# Patient Record
Sex: Female | Born: 1955 | Race: White | Hispanic: No | Smoking: Current every day smoker
Health system: Southern US, Community
[De-identification: ages and names within clinical notes are randomized; demographics above are authoritative.]

## PROBLEM LIST (undated history)

## (undated) DIAGNOSIS — C801 Malignant (primary) neoplasm, unspecified: Secondary | ICD-10-CM

## (undated) DIAGNOSIS — M1990 Unspecified osteoarthritis, unspecified site: Secondary | ICD-10-CM

## (undated) DIAGNOSIS — F419 Anxiety disorder, unspecified: Secondary | ICD-10-CM

## (undated) DIAGNOSIS — I2510 Atherosclerotic heart disease of native coronary artery without angina pectoris: Secondary | ICD-10-CM

## (undated) DIAGNOSIS — J449 Chronic obstructive pulmonary disease, unspecified: Secondary | ICD-10-CM

## (undated) DIAGNOSIS — R0602 Shortness of breath: Secondary | ICD-10-CM

## (undated) DIAGNOSIS — I639 Cerebral infarction, unspecified: Secondary | ICD-10-CM

## (undated) DIAGNOSIS — E785 Hyperlipidemia, unspecified: Secondary | ICD-10-CM

## (undated) DIAGNOSIS — J189 Pneumonia, unspecified organism: Secondary | ICD-10-CM

## (undated) DIAGNOSIS — Z9981 Dependence on supplemental oxygen: Secondary | ICD-10-CM

## (undated) DIAGNOSIS — I10 Essential (primary) hypertension: Secondary | ICD-10-CM

## (undated) DIAGNOSIS — F32A Depression, unspecified: Secondary | ICD-10-CM

## (undated) DIAGNOSIS — K219 Gastro-esophageal reflux disease without esophagitis: Secondary | ICD-10-CM

## (undated) DIAGNOSIS — R51 Headache: Secondary | ICD-10-CM

## (undated) DIAGNOSIS — F329 Major depressive disorder, single episode, unspecified: Secondary | ICD-10-CM

## (undated) DIAGNOSIS — R32 Unspecified urinary incontinence: Secondary | ICD-10-CM

## (undated) HISTORY — PX: TUBAL LIGATION: SHX77

## (undated) HISTORY — PX: ROTATOR CUFF REPAIR: SHX139

## (undated) HISTORY — PX: CHOLECYSTECTOMY: SHX55

## (undated) HISTORY — PX: CAROTID STENT: SHX1301

## (undated) HISTORY — DX: Essential (primary) hypertension: I10

---

## 2005-06-09 ENCOUNTER — Inpatient Hospital Stay (HOSPITAL_COMMUNITY): Admission: EM | Admit: 2005-06-09 | Discharge: 2005-06-10 | Payer: Self-pay | Admitting: Emergency Medicine

## 2005-06-10 ENCOUNTER — Encounter (INDEPENDENT_AMBULATORY_CARE_PROVIDER_SITE_OTHER): Payer: Self-pay | Admitting: General Surgery

## 2009-09-16 ENCOUNTER — Emergency Department (HOSPITAL_COMMUNITY): Admission: EM | Admit: 2009-09-16 | Discharge: 2009-09-16 | Payer: Self-pay | Admitting: Emergency Medicine

## 2009-12-05 ENCOUNTER — Inpatient Hospital Stay (HOSPITAL_COMMUNITY): Admission: EM | Admit: 2009-12-05 | Discharge: 2009-12-13 | Payer: Self-pay | Admitting: Emergency Medicine

## 2009-12-06 ENCOUNTER — Encounter (INDEPENDENT_AMBULATORY_CARE_PROVIDER_SITE_OTHER): Payer: Self-pay | Admitting: Neurology

## 2009-12-10 ENCOUNTER — Ambulatory Visit: Payer: Self-pay | Admitting: Physical Medicine & Rehabilitation

## 2009-12-26 ENCOUNTER — Inpatient Hospital Stay (HOSPITAL_COMMUNITY): Admission: RE | Admit: 2009-12-26 | Discharge: 2009-12-27 | Payer: Self-pay | Admitting: Interventional Radiology

## 2010-01-09 ENCOUNTER — Encounter: Payer: Self-pay | Admitting: Interventional Radiology

## 2010-01-10 ENCOUNTER — Ambulatory Visit (HOSPITAL_COMMUNITY): Admission: RE | Admit: 2010-01-10 | Discharge: 2010-01-10 | Payer: Self-pay | Admitting: Interventional Radiology

## 2010-01-10 ENCOUNTER — Ambulatory Visit: Payer: Self-pay | Admitting: Vascular Surgery

## 2010-01-10 ENCOUNTER — Encounter (INDEPENDENT_AMBULATORY_CARE_PROVIDER_SITE_OTHER): Payer: Self-pay | Admitting: Interventional Radiology

## 2010-02-12 ENCOUNTER — Ambulatory Visit (HOSPITAL_COMMUNITY): Admission: RE | Admit: 2010-02-12 | Discharge: 2010-02-13 | Payer: Self-pay | Admitting: Interventional Radiology

## 2010-02-26 ENCOUNTER — Encounter: Payer: Self-pay | Admitting: Interventional Radiology

## 2010-11-17 ENCOUNTER — Encounter: Payer: Self-pay | Admitting: Neurology

## 2011-01-14 LAB — BASIC METABOLIC PANEL
BUN: 11 mg/dL (ref 6–23)
CO2: 27 mEq/L (ref 19–32)
Calcium: 9.8 mg/dL (ref 8.4–10.5)
Chloride: 105 mEq/L (ref 96–112)
Creatinine, Ser: 0.99 mg/dL (ref 0.4–1.2)
GFR calc Af Amer: >60 mL/min (ref >60)
Potassium: 4.4 mEq/L (ref 3.5–5.1)

## 2011-01-14 LAB — DIFFERENTIAL
Basophils Relative: 0 % (ref 0–1)
Eosinophils Absolute: 0.2 10*3/uL (ref 0.0–0.7)
Eosinophils Relative: 2 % (ref 0–5)
Lymphs Abs: 3.4 10*3/uL (ref 0.7–4.0)
Monocytes Absolute: 0.5 10*3/uL (ref 0.1–1.0)
Neutro Abs: 4.8 10*3/uL (ref 1.7–7.7)

## 2011-01-14 LAB — PROTIME-INR
INR: 1.08 (ref 0.00–1.49)
INR: 1.11 (ref 0.00–1.49)
Prothrombin Time: 13.9 seconds (ref 11.6–15.2)
Prothrombin Time: 14.2 seconds (ref 11.6–15.2)

## 2011-01-14 LAB — HEPARIN LEVEL (UNFRACTIONATED)
Heparin Unfractionated: 0.14 IU/mL — ABNORMAL LOW (ref 0.30–0.70)
Heparin Unfractionated: 0.15 IU/mL — ABNORMAL LOW (ref 0.30–0.70)

## 2011-01-14 LAB — CBC
Hemoglobin: 12.4 g/dL (ref 12.0–15.0)
MCHC: 34.5 g/dL (ref 30.0–36.0)
MCV: 88.3 fL (ref 78.0–100.0)
RBC: 4.01 MIL/uL (ref 3.87–5.11)

## 2011-01-14 LAB — APTT: aPTT: 50 seconds — ABNORMAL HIGH (ref 24–37)

## 2011-01-16 LAB — URINALYSIS, ROUTINE W REFLEX MICROSCOPIC
Nitrite: NEGATIVE
Protein, ur: NEGATIVE mg/dL
Specific Gravity, Urine: 1.016 (ref 1.005–1.030)
Urobilinogen, UA: 0.2 mg/dL (ref 0.0–1.0)

## 2011-01-16 LAB — COMPREHENSIVE METABOLIC PANEL
AST: 12 U/L (ref 0–37)
CO2: 30 mEq/L (ref 19–32)
Calcium: 9.3 mg/dL (ref 8.4–10.5)
Creatinine, Ser: 0.78 mg/dL (ref 0.4–1.2)
GFR calc Af Amer: >60 mL/min (ref >60)
GFR calc non Af Amer: >60 mL/min (ref >60)

## 2011-01-16 LAB — RAPID URINE DRUG SCREEN, HOSP PERFORMED
Amphetamines: NOT DETECTED
Cocaine: NOT DETECTED
Opiates: NOT DETECTED
Tetrahydrocannabinol: NOT DETECTED

## 2011-01-16 LAB — BASIC METABOLIC PANEL
BUN: 6 mg/dL (ref 6–23)
Chloride: 103 mEq/L (ref 96–112)
Glucose, Bld: 100 mg/dL — ABNORMAL HIGH (ref 70–99)
Potassium: 4.4 mEq/L (ref 3.5–5.1)
Sodium: 139 mEq/L (ref 135–145)

## 2011-01-16 LAB — DIFFERENTIAL
Basophils Absolute: 0 10*3/uL (ref 0.0–0.1)
Eosinophils Absolute: 0.2 10*3/uL (ref 0.0–0.7)
Eosinophils Relative: 2 % (ref 0–5)
Eosinophils Relative: 2 % (ref 0–5)
Lymphocytes Relative: 36 % (ref 12–46)
Lymphocytes Relative: 41 % (ref 12–46)
Lymphs Abs: 3.5 10*3/uL (ref 0.7–4.0)
Lymphs Abs: 3.7 10*3/uL (ref 0.7–4.0)
Monocytes Absolute: 0.6 10*3/uL (ref 0.1–1.0)
Neutro Abs: 4.3 10*3/uL (ref 1.7–7.7)

## 2011-01-16 LAB — LIPID PANEL
Cholesterol: 201 mg/dL — ABNORMAL HIGH (ref 0–200)
LDL Cholesterol: 122 mg/dL — ABNORMAL HIGH (ref 0–99)

## 2011-01-16 LAB — PROTIME-INR
INR: 1.07 (ref 0.00–1.49)
Prothrombin Time: 13.2 seconds (ref 11.6–15.2)
Prothrombin Time: 13.8 seconds (ref 11.6–15.2)

## 2011-01-16 LAB — CBC
HCT: 45.3 % (ref 36.0–46.0)
Hemoglobin: 15.3 g/dL — ABNORMAL HIGH (ref 12.0–15.0)
MCHC: 34.3 g/dL (ref 30.0–36.0)
MCV: 90.4 fL (ref 78.0–100.0)
MCV: 90.9 fL (ref 78.0–100.0)
Platelets: 202 10*3/uL (ref 150–400)
Platelets: 205 10*3/uL (ref 150–400)
RDW: 13.7 % (ref 11.5–15.5)

## 2011-01-16 LAB — HEMOGLOBIN A1C: Mean Plasma Glucose: 114 mg/dL

## 2011-01-16 LAB — POCT CARDIAC MARKERS: Myoglobin, poc: 74.8 ng/mL (ref 12–200)

## 2011-01-16 LAB — APTT: aPTT: 30 seconds (ref 24–37)

## 2011-01-16 LAB — CK TOTAL AND CKMB (NOT AT ARMC): Relative Index: INVALID (ref 0.0–2.5)

## 2011-01-16 LAB — TROPONIN I: Troponin I: <0.01 ng/mL (ref 0.00–0.06)

## 2011-01-20 LAB — CBC
HCT: 40.2 % (ref 36.0–46.0)
Hemoglobin: 13.4 g/dL (ref 12.0–15.0)
MCV: 90.1 fL (ref 78.0–100.0)
Platelets: 173 10*3/uL (ref 150–400)
RBC: 4.46 MIL/uL (ref 3.87–5.11)
WBC: 9.1 10*3/uL (ref 4.0–10.5)

## 2011-01-20 LAB — HEPARIN LEVEL (UNFRACTIONATED): Heparin Unfractionated: 0.23 IU/mL — ABNORMAL LOW (ref 0.30–0.70)

## 2011-01-20 LAB — BASIC METABOLIC PANEL
BUN: 5 mg/dL — ABNORMAL LOW (ref 6–23)
Chloride: 114 mEq/L — ABNORMAL HIGH (ref 96–112)
GFR calc non Af Amer: >60 mL/min (ref >60)
Potassium: 3.5 mEq/L (ref 3.5–5.1)
Sodium: 144 mEq/L (ref 135–145)

## 2011-01-20 LAB — MRSA PCR SCREENING: MRSA by PCR: NEGATIVE

## 2011-01-29 LAB — DIFFERENTIAL
Lymphocytes Relative: 33 % (ref 12–46)
Lymphs Abs: 3.1 10*3/uL (ref 0.7–4.0)
Monocytes Relative: 4 % (ref 3–12)
Neutro Abs: 5.7 10*3/uL (ref 1.7–7.7)
Neutrophils Relative %: 61 % (ref 43–77)

## 2011-01-29 LAB — CBC
HCT: 46.5 % — ABNORMAL HIGH (ref 36.0–46.0)
Hemoglobin: 15.6 g/dL — ABNORMAL HIGH (ref 12.0–15.0)
MCHC: 33.5 g/dL (ref 30.0–36.0)
RDW: 14.5 % (ref 11.5–15.5)

## 2011-01-29 LAB — COMPREHENSIVE METABOLIC PANEL
BUN: 8 mg/dL (ref 6–23)
Calcium: 9.3 mg/dL (ref 8.4–10.5)
Creatinine, Ser: 0.75 mg/dL (ref 0.4–1.2)
Glucose, Bld: 110 mg/dL — ABNORMAL HIGH (ref 70–99)
Sodium: 141 mEq/L (ref 135–145)
Total Protein: 6.8 g/dL (ref 6.0–8.3)

## 2011-01-29 LAB — URINALYSIS, ROUTINE W REFLEX MICROSCOPIC
Glucose, UA: NEGATIVE mg/dL
Hgb urine dipstick: NEGATIVE
Specific Gravity, Urine: 1.02 (ref 1.005–1.030)

## 2011-01-29 LAB — RAPID URINE DRUG SCREEN, HOSP PERFORMED
Amphetamines: NOT DETECTED
Barbiturates: NOT DETECTED
Benzodiazepines: POSITIVE — AB
Opiates: NOT DETECTED

## 2011-03-14 NOTE — Op Note (Signed)
[name], April [name]               ACCOUNT NO.:  [id number]   MEDICAL RECORD NO.:  [id number]          PATIENT TYPE:  INP   LOCATION:  A308                          FACILITY:  [facility]   PHYSICIAN:  Dalia Heading, M.D.  DATE OF BIRTH:  Sep 17, 1956   DATE OF PROCEDURE:  06/10/2005  DATE OF DISCHARGE:                                 OPERATIVE REPORT   PREOPERATIVE DIAGNOSIS:  Cholecystitis, cholelithiasis.   POSTOPERATIVE DIAGNOSIS:  Cholecystitis, cholelithiasis.   PROCEDURE:  Laparoscopic cholecystectomy.   SURGEON:  Dr. Franky Macho.   ANESTHESIA:  General endotracheal.   INDICATIONS:  The patient is a 55 year old white female who presents with  acute cholecystitis secondary to cholelithiasis. This was confirmed by  ultrasound. Risks and benefits of the procedure including bleeding,  infection, hepatobiliary injury, and the possibly of an open procedure were  fully explained to the patient, who gave informed consent.   PROCEDURE NOTE:  The patient was placed in supine position. After induction  of general endotracheal anesthesia, the abdomen was prepped and draped using  the usual sterile technique with Betadine. Surgical site confirmation was  performed.   A supraumbilical incision was made down to fascia. Veress needle was  introduced into the abdominal cavity, and confirmation of placement was done  using the saline drop test. The abdomen was then insufflated to 16 mmHg  pressure. An 11-mm trocar was introduced into the abdominal cavity under  direct visualization without difficulty. The patient was placed in reversed  Trendelenburg position. An additional 11-mm trocar was placed in the  epigastric region, and 5-mm trocar were placed in the right upper quadrant  and right flank regions. Liver was inspected and noted to normal limits. The  gallbladder was retracted superiorly and laterally. Dissection was begun  around the infundibulum of the gallbladder. Cystic duct was  first  identified. Its juncture to the infundibulum fully identified. Endoclips  were placed proximally and distally on the cystic duct, and cystic duct was  divided. This was likewise done to the cystic artery. The gallbladder then  freed away from the gallbladder fossa using Bovie electrocautery. The  gallbladder was delivered through the epigastric trocar site using an  EndoCatch bag. Gallbladder fossa was inspected. No abnormal bleeding or bile  leakage was noted. Surgicel was placed in the gallbladder fossa. All fluid  and air were then evacuated from the abdominal cavity prior to removal of  the trocars.   All wounds were irrigated with normal saline. All wounds were injected with  0.5% Sensorcaine. The supraumbilical fascia was reapproximated using a 0  Vicryl interrupted suture. All skin incisions were closed using staples.  Betadine ointment and dry sterile dressings were applied.   All tape and needle counts were correct at the end of the procedure. The  patient was extubated in the operating room and went back to recovery room  awake in stable condition.   COMPLICATIONS:  None.   SPECIMEN:  Gallbladder with stones.   BLOOD LOSS:  Minimal.      Dalia Heading, M.D.  Electronically Signed     MAJ/MEDQ  D:  06/10/2005  T:  06/10/2005  Job:  [id number]

## 2011-03-14 NOTE — Discharge Summary (Signed)
NAME:  TRACE, CEDERBERG NO.:  [id number]   MEDICAL RECORD NO.:  [id number]          PATIENT TYPE:  INP   LOCATION:  A308                          FACILITY:  [facility]   PHYSICIAN:  Dalia Heading, M.D.  DATE OF BIRTH:  November 12, 1955   DATE OF ADMISSION:  06/09/2005  DATE OF DISCHARGE:  08/15/2006LH                                 DISCHARGE SUMMARY   HOSPITAL COURSE:  The patient is a 55 year old, white female who presented  to the emergency room with right upper quadrant abdominal pain secondary to  cholecystitis, cholelithiasis.  Surgery consultation was obtained and the  patient was admitted to the hospital for control of her pain and nausea.  She subsequently underwent laparoscopic cholecystectomy on June 10, 2005.  She tolerated the procedure well.  Her postoperative course has been  unremarkable.  She was able to tolerate regular diet without difficulty.  She is being discharged home on June 10, 2005, in good and improving  condition.   FOLLOW UP:  The patient is to follow up Dr. Franky Macho on June 17, 2005.   DISCHARGE MEDICATIONS:  1.  Vicodin 1-2 tablets p.o. q.4h. p.r.n. pain.  2.  She is to resume other medications as previous prescribed.   DISCHARGE DIAGNOSES:  1.  Cholecystitis, cholelithiasis.  2.  Depression.   PROCEDURE:  Laparoscopic cholecystectomy on June 10, 2005.      Dalia Heading, M.D.  Electronically Signed     MAJ/MEDQ  D:  06/10/2005  T:  06/10/2005  Job:  [id number]

## 2011-03-14 NOTE — H&P (Signed)
April [name], April [name] NO.:  [id number]   MEDICAL RECORD NO.:  [id number]          PATIENT TYPE:  EMS   LOCATION:  ED                            FACILITY:  [facility]   PHYSICIAN:  Dalia Heading, M.D.  DATE OF BIRTH:  17-Oct-1956   DATE OF ADMISSION:  06/09/2005  DATE OF DISCHARGE:  LH                                HISTORY & PHYSICAL   REASON FOR ADMISSION:  Acute cholecystitis and cholelithiasis.   HISTORY OF PRESENT ILLNESS:  The patient is a 55 year old white female who  presents with one-week history of worsening right upper quadrant abdominal  pain, nausea, vomiting.  She has been seen both in [place] and today at  [facility].  An ultrasound today of the gallbladder revealed  acute cholecystitis with cholelithiasis.  She states that she has  intermitted right upper quadrant pain, nausea, vomiting, with postprandial  pain.   PAST MEDICAL HISTORY:  Includes panic attacks.   PAST SURGICAL HISTORY:  Unremarkable.   CURRENT MEDICATIONS:  Phenergan as needed for nausea.   ALLERGIES:  No known drug allergies.   REVIEW OF SYSTEMS:  Noncontributory.   SOCIAL HISTORY:  The patient denies alcohol or tobacco use.   PHYSICAL EXAMINATION:  GENERAL:  The patient is a well-developed, well-  nourished white female in no acute distress.  VITAL SIGNS:  She is afebrile, and vital signs are stable.  HEENT:  No scleral icterus.  LUNGS:  Clear to auscultation with equal breath sounds bilaterally.  HEART:  Regular rate and rhythm without S3, S4, or murmurs.  ABDOMEN: Soft with tenderness in the right upper quadrant to palpation.  No  hepatosplenomegaly or masses are noted.  RECTAL:  Examination deferred at this time.   CBC is within normal limits.  MET-7 is unremarkable.  Liver enzymes tests  were within normal limits.  Amylase and lipase were within normal limits.   IMPRESSION:  Acute cholecystitis, cholelithiasis.   PLAN:  The patient will be admitted  into the hospital for intravenous  hydration, control of nausea, and pain control.  She will undergo a  laparoscopic cholecystectomy in the morning.  The risks and benefits of the  procedure including bleeding, infection, hepatobiliary injury, and the  possibility of an open procedure were fully explained to the patient, who  gave informed consent.      Dalia Heading, M.D.  Electronically Signed     MAJ/MEDQ  D:  06/09/2005  T:  06/09/2005  Job:  [id number]

## 2011-04-07 ENCOUNTER — Other Ambulatory Visit: Payer: Self-pay | Admitting: Gastroenterology

## 2011-05-16 ENCOUNTER — Emergency Department (HOSPITAL_COMMUNITY)
Admission: EM | Admit: 2011-05-16 | Discharge: 2011-05-17 | Disposition: A | Discharge status: Home / Self Care | Payer: Medicaid Other | Attending: Emergency Medicine | Admitting: Emergency Medicine

## 2011-05-16 ENCOUNTER — Other Ambulatory Visit (HOSPITAL_COMMUNITY): Payer: Medicaid Other

## 2011-05-16 ENCOUNTER — Emergency Department (HOSPITAL_COMMUNITY): Payer: Medicaid Other

## 2011-05-16 DIAGNOSIS — L539 Erythematous condition, unspecified: Secondary | ICD-10-CM | POA: Insufficient documentation

## 2011-05-16 DIAGNOSIS — S92309A Fracture of unspecified metatarsal bone(s), unspecified foot, initial encounter for closed fracture: Secondary | ICD-10-CM | POA: Insufficient documentation

## 2011-05-16 DIAGNOSIS — L02619 Cutaneous abscess of unspecified foot: Secondary | ICD-10-CM | POA: Insufficient documentation

## 2011-05-16 DIAGNOSIS — E785 Hyperlipidemia, unspecified: Secondary | ICD-10-CM | POA: Insufficient documentation

## 2011-05-16 DIAGNOSIS — I10 Essential (primary) hypertension: Secondary | ICD-10-CM | POA: Insufficient documentation

## 2011-05-16 DIAGNOSIS — Z8679 Personal history of other diseases of the circulatory system: Secondary | ICD-10-CM | POA: Insufficient documentation

## 2011-05-16 DIAGNOSIS — W64XXXA Exposure to other animate mechanical forces, initial encounter: Secondary | ICD-10-CM | POA: Insufficient documentation

## 2011-05-16 DIAGNOSIS — M7989 Other specified soft tissue disorders: Secondary | ICD-10-CM | POA: Insufficient documentation

## 2011-05-16 DIAGNOSIS — L03119 Cellulitis of unspecified part of limb: Secondary | ICD-10-CM | POA: Insufficient documentation

## 2011-05-16 DIAGNOSIS — R609 Edema, unspecified: Secondary | ICD-10-CM | POA: Insufficient documentation

## 2011-05-16 DIAGNOSIS — M79609 Pain in unspecified limb: Secondary | ICD-10-CM | POA: Insufficient documentation

## 2011-05-16 LAB — BASIC METABOLIC PANEL
BUN: 22 mg/dL (ref 6–23)
Calcium: 9.4 mg/dL (ref 8.4–10.5)
Creatinine, Ser: 0.91 mg/dL (ref 0.50–1.10)
GFR calc Af Amer: >60 mL/min (ref >60)
GFR calc non Af Amer: >60 mL/min (ref >60)
Glucose, Bld: 89 mg/dL (ref 70–99)
Potassium: 4 mEq/L (ref 3.5–5.1)

## 2011-05-16 LAB — DIFFERENTIAL
Basophils Absolute: 0 10*3/uL (ref 0.0–0.1)
Basophils Relative: 0 % (ref 0–1)
Eosinophils Relative: 4 % (ref 0–5)
Lymphocytes Relative: 35 % (ref 12–46)
Monocytes Absolute: 0.5 10*3/uL (ref 0.1–1.0)
Monocytes Relative: 6 % (ref 3–12)

## 2011-05-16 LAB — CBC
HCT: 36 % (ref 36.0–46.0)
MCH: 27.2 pg (ref 26.0–34.0)
MCHC: 33.1 g/dL (ref 30.0–36.0)
RDW: 14 % (ref 11.5–15.5)

## 2011-05-16 MED ORDER — GADOBENATE DIMEGLUMINE 529 MG/ML IV SOLN
20.0000 mL | Freq: Once | INTRAVENOUS | Status: AC | PRN
  Administered 2011-05-16: 20 mL via INTRAVENOUS

## 2011-06-20 ENCOUNTER — Other Ambulatory Visit: Payer: Self-pay | Admitting: Internal Medicine

## 2011-06-20 NOTE — Telephone Encounter (Signed)
We are not PCP. 

## 2011-07-21 ENCOUNTER — Other Ambulatory Visit: Payer: Self-pay | Admitting: Internal Medicine

## 2011-07-21 NOTE — Telephone Encounter (Signed)
Patient is not followed in our clinic.

## 2013-01-04 ENCOUNTER — Observation Stay (HOSPITAL_COMMUNITY)
Admission: EM | Admit: 2013-01-04 | Discharge: 2013-01-06 | Disposition: A | Discharge status: Home / Self Care | Payer: Medicare Other | Attending: Internal Medicine | Admitting: Internal Medicine

## 2013-01-04 ENCOUNTER — Emergency Department (HOSPITAL_COMMUNITY): Payer: Medicare Other

## 2013-01-04 ENCOUNTER — Encounter (HOSPITAL_COMMUNITY): Payer: Self-pay

## 2013-01-04 DIAGNOSIS — G459 Transient cerebral ischemic attack, unspecified: Secondary | ICD-10-CM | POA: Diagnosis present

## 2013-01-04 DIAGNOSIS — I6789 Other cerebrovascular disease: Secondary | ICD-10-CM | POA: Insufficient documentation

## 2013-01-04 DIAGNOSIS — J441 Chronic obstructive pulmonary disease with (acute) exacerbation: Secondary | ICD-10-CM | POA: Diagnosis present

## 2013-01-04 DIAGNOSIS — R51 Headache: Secondary | ICD-10-CM | POA: Insufficient documentation

## 2013-01-04 DIAGNOSIS — Z8673 Personal history of transient ischemic attack (TIA), and cerebral infarction without residual deficits: Secondary | ICD-10-CM | POA: Insufficient documentation

## 2013-01-04 DIAGNOSIS — J4489 Other specified chronic obstructive pulmonary disease: Secondary | ICD-10-CM | POA: Insufficient documentation

## 2013-01-04 DIAGNOSIS — J449 Chronic obstructive pulmonary disease, unspecified: Secondary | ICD-10-CM | POA: Diagnosis present

## 2013-01-04 DIAGNOSIS — R519 Headache, unspecified: Secondary | ICD-10-CM | POA: Diagnosis present

## 2013-01-04 DIAGNOSIS — R42 Dizziness and giddiness: Principal | ICD-10-CM | POA: Diagnosis present

## 2013-01-04 DIAGNOSIS — R209 Unspecified disturbances of skin sensation: Secondary | ICD-10-CM | POA: Insufficient documentation

## 2013-01-04 DIAGNOSIS — G43909 Migraine, unspecified, not intractable, without status migrainosus: Secondary | ICD-10-CM | POA: Diagnosis present

## 2013-01-04 DIAGNOSIS — R079 Chest pain, unspecified: Secondary | ICD-10-CM | POA: Insufficient documentation

## 2013-01-04 HISTORY — DX: Anxiety disorder, unspecified: F41.9

## 2013-01-04 HISTORY — DX: Gastro-esophageal reflux disease without esophagitis: K21.9

## 2013-01-04 HISTORY — DX: Unspecified urinary incontinence: R32

## 2013-01-04 HISTORY — DX: Chronic obstructive pulmonary disease, unspecified: J44.9

## 2013-01-04 HISTORY — DX: Shortness of breath: R06.02

## 2013-01-04 HISTORY — DX: Major depressive disorder, single episode, unspecified: F32.9

## 2013-01-04 HISTORY — DX: Unspecified osteoarthritis, unspecified site: M19.90

## 2013-01-04 HISTORY — DX: Atherosclerotic heart disease of native coronary artery without angina pectoris: I25.10

## 2013-01-04 HISTORY — DX: Headache: R51

## 2013-01-04 HISTORY — DX: Hyperlipidemia, unspecified: E78.5

## 2013-01-04 HISTORY — DX: Cerebral infarction, unspecified: I63.9

## 2013-01-04 HISTORY — DX: Depression, unspecified: F32.A

## 2013-01-04 LAB — CBC WITH DIFFERENTIAL/PLATELET
Basophils Absolute: 0 10*3/uL (ref 0.0–0.1)
Basophils Relative: 0 % (ref 0–1)
Lymphocytes Relative: 34 % (ref 12–46)
MCHC: 34.1 g/dL (ref 30.0–36.0)
Monocytes Absolute: 0.4 10*3/uL (ref 0.1–1.0)
Neutro Abs: 3.5 10*3/uL (ref 1.7–7.7)
Neutrophils Relative %: 59 % (ref 43–77)
Platelets: 212 10*3/uL (ref 150–400)
RDW: 14.8 % (ref 11.5–15.5)
WBC: 5.9 10*3/uL (ref 4.0–10.5)

## 2013-01-04 LAB — BASIC METABOLIC PANEL
BUN: 18 mg/dL (ref 6–23)
Calcium: 9.2 mg/dL (ref 8.4–10.5)
Chloride: 106 mEq/L (ref 96–112)
Creatinine, Ser: 0.93 mg/dL (ref 0.50–1.10)
GFR calc Af Amer: 79 mL/min — ABNORMAL LOW (ref >90)
GFR calc non Af Amer: 68 mL/min — ABNORMAL LOW (ref >90)

## 2013-01-04 MED ORDER — PANTOPRAZOLE SODIUM 40 MG PO TBEC
40.0000 mg | DELAYED_RELEASE_TABLET | Freq: Every day | ORAL | Status: DC
  Administered 2013-01-04 – 2013-01-06 (×3): 40 mg via ORAL
  Filled 2013-01-04 (×3): qty 1

## 2013-01-04 MED ORDER — TRAZODONE HCL 150 MG PO TABS
150.0000 mg | ORAL_TABLET | Freq: Every day | ORAL | Status: DC
  Administered 2013-01-04 – 2013-01-05 (×2): 150 mg via ORAL
  Filled 2013-01-04 (×3): qty 1

## 2013-01-04 MED ORDER — SIMVASTATIN 40 MG PO TABS
40.0000 mg | ORAL_TABLET | Freq: Every day | ORAL | Status: DC
  Administered 2013-01-05: 40 mg via ORAL
  Filled 2013-01-04 (×3): qty 1

## 2013-01-04 MED ORDER — TIOTROPIUM BROMIDE MONOHYDRATE 18 MCG IN CAPS
18.0000 ug | ORAL_CAPSULE | Freq: Every day | RESPIRATORY_TRACT | Status: DC
  Administered 2013-01-04: 18 ug via RESPIRATORY_TRACT
  Filled 2013-01-04: qty 5

## 2013-01-04 MED ORDER — MONTELUKAST SODIUM 10 MG PO TABS
10.0000 mg | ORAL_TABLET | Freq: Every morning | ORAL | Status: DC
  Administered 2013-01-05 – 2013-01-06 (×2): 10 mg via ORAL
  Filled 2013-01-04 (×2): qty 1

## 2013-01-04 MED ORDER — DIAZEPAM 5 MG PO TABS
5.0000 mg | ORAL_TABLET | Freq: Two times a day (BID) | ORAL | Status: DC
  Administered 2013-01-04 – 2013-01-06 (×4): 5 mg via ORAL
  Filled 2013-01-04 (×4): qty 1

## 2013-01-04 MED ORDER — ENOXAPARIN SODIUM 40 MG/0.4ML ~~LOC~~ SOLN
40.0000 mg | SUBCUTANEOUS | Status: DC
  Administered 2013-01-04 – 2013-01-05 (×2): 40 mg via SUBCUTANEOUS
  Filled 2013-01-04 (×3): qty 0.4

## 2013-01-04 MED ORDER — SENNOSIDES-DOCUSATE SODIUM 8.6-50 MG PO TABS
1.0000 | ORAL_TABLET | Freq: Every evening | ORAL | Status: DC | PRN
  Filled 2013-01-04: qty 1

## 2013-01-04 MED ORDER — CLOPIDOGREL BISULFATE 75 MG PO TABS
75.0000 mg | ORAL_TABLET | Freq: Every day | ORAL | Status: DC
Start: 2013-01-04 — End: 2013-01-06
  Administered 2013-01-05 – 2013-01-06 (×2): 75 mg via ORAL
  Filled 2013-01-04 (×3): qty 1

## 2013-01-04 MED ORDER — ONDANSETRON HCL 4 MG/2ML IJ SOLN: 4.0000 mg | Freq: Three times a day (TID) | INTRAMUSCULAR | Status: DC | PRN

## 2013-01-04 MED ORDER — DIVALPROEX SODIUM 500 MG PO DR TAB
500.0000 mg | DELAYED_RELEASE_TABLET | Freq: Two times a day (BID) | ORAL | Status: DC
  Administered 2013-01-04 – 2013-01-06 (×4): 500 mg via ORAL
  Filled 2013-01-04 (×6): qty 1

## 2013-01-04 MED ORDER — VENLAFAXINE HCL ER 150 MG PO CP24
150.0000 mg | ORAL_CAPSULE | Freq: Every day | ORAL | Status: DC
  Administered 2013-01-05 – 2013-01-06 (×2): 150 mg via ORAL
  Filled 2013-01-04 (×2): qty 1

## 2013-01-04 MED ORDER — ONDANSETRON HCL 4 MG/2ML IJ SOLN: 4.0000 mg | Freq: Four times a day (QID) | INTRAMUSCULAR | Status: DC | PRN

## 2013-01-04 NOTE — ED Notes (Signed)
Per ems- pt c/o dizziness beginning at 1345. Pt states earlier in the week she had the same symptoms. Pt has hx of multiple tias and stroke. VSS. BP-110/80 HR-84 NSR, O2-96% RA Pt also c/o headache "all morning." Pt has hx of left sided deficit. 18g IV placed PTA in r wrist.

## 2013-01-04 NOTE — ED Notes (Signed)
Preparing pt for transport.

## 2013-01-04 NOTE — ED Notes (Addendum)
Report called to Lillia Abed, RN on 3W.

## 2013-01-04 NOTE — ED Notes (Signed)
Attempted to call report. Spoke with Thayer Ohm, RN.  States that he will call back.

## 2013-01-04 NOTE — Consult Note (Signed)
Reason for Consult: Transient left-sided tingling Referring Physician: Kathlen Mody  CC: Dizziness  History is obtained from: Patient  HPI: April [name] is a 57 y.o. female who for the past year has been having approximately 2 episodes per month of "dizziness" which she describes as lightheadedness rather than a room spinning sensation accompanied with a feeling that she is about to pass out and a feeling of bilateral leg weakness. This is followed typically and 15-20 minutes by throbbing headache characteristic of her previous migraines. She did not previously had these episodes of dizziness, however. She has acutally passed out with a least one of these episodes.   Today, her lightheadedness was particularly severe and she also had some left hand tingling started in her fingertips and then worked his way up to her mid forearm. She has had this before with previous episodes  She continues to have a headache, though it is improving.    LKW: 1:45 PM tpa given: no, stroke not suspected, symptoms rapidly improving to be only mild symptoms   ROS: A 14 point ROS was performed and is negative except as noted in the HPI.  Past Medical History  Diagnosis Date  . Stroke   . Hyperlipidemia   . COPD (chronic obstructive pulmonary disease)     Family History: Aunt-stroke  Social History: Tob: Current everyday smoker  Exam: Current vital signs: BP 95/69  Pulse 71  Temp(Src) 98.6 F (37 C) (Oral)  Resp 12  SpO2 95% Vital signs in last 24 hours: Temp:  [98.6 F (37 C)] 98.6 F (37 C) (03/11 1607) Pulse Rate:  [71-76] 71 (03/11 1645) Resp:  [12-29] 12 (03/11 1645) BP: (95-115)/(63-71) 95/69 mmHg (03/11 1645) SpO2:  [92 %-95 %] 95 % (03/11 1645)  General: in bed, NAD CV: RRR Mental Status: Patient is awake, alert, oriented to person, place, month, year, and situation. Immediate and remote memory are intact. Patient is able to give a clear and coherent history. Mild difficulty  with world backwards  Cranial Nerves: II: Visual Fields are full. Pupils are equal, round, and reactive to light.  Discs are difficult to visualize. III,IV, VI: Mild left external deviation(patient reports old deficit)  V: Facial sensation is decreased on left VII: Facial movement is decreased on left lower face.  VIII: hearing is intact to voice X: Uvula elevates symmetrically XI: Shoulder shrug is symmetric. XII: tongue is midline without atrophy or fasciculations.  Motor: Tone is normal. Bulk is normal. 5/5 strength was present on right, on left she is 4/5 in RUE, 4+/5 in RLE.  Sensory: Sensation is diminished on right to LT in the arm and leg.  Deep Tendon Reflexes: 2+ and symmetric in the biceps and patellae.  Cerebellar: FNF  intact bilaterally Gait: Patient becomes lightheaded on standing.   I have reviewed labs in epic and the results pertinent to this consultation are: BMP, CBC unremarkable.   I have reviewed the images obtained:CT head - old rt parietal infarct.   Impression: 57 yo F with recurrent episodes of lightheadedness +/- left hand tingling followed by headache. Given the stereotyped nature and long standing nature of these symptoms, I do not feel that TIA is likely. She has been admitted for TIA workup, but given that she still has mild symptoms, do not feel that a full workup is necessary unless MRI is positive for stroke(which I feel is unlikely).   I suspect that this represents complicated migraine. Another possibility would be near syncope as a trigger  for her migraine. There is a chance that this could represent parietal seizure, and an EEG would be reasonable.   Recommendations: 1) EEG 2) Depakote level in the morning, if room to increase, could consider small increase for migraine prophylaxis.  3) MRI brain w/o contrast, if negative, no need for further stroke workup, though an echo could be reasonable as a part of a syncope workup.  4) Orthostatic vital  signs.  5) Telemetry given episodes of near syncope.   Ritta Slot, MD [facility] [phone]  If 7pm- 7am, please page neurology on call at [phone].

## 2013-01-04 NOTE — ED Provider Notes (Signed)
History     CSN: [id number]  Arrival date & time 01/04/13  1556   First MD Initiated Contact with Patient 01/04/13 1601      Chief Complaint  Patient presents with  . Dizziness    HPI pt c/o dizziness beginning at 1345. Pt states earlier in the week she had the same symptoms. Pt has hx of multiple tias and stroke. VSS. BP-110/80 HR-84 NSR, O2-96% RA .Pt also c/o headache "all morning." Pt has hx of left sided deficit.  Past Medical History  Diagnosis Date  . Stroke   . Hyperlipidemia   . COPD (chronic obstructive pulmonary disease)     Past Surgical History  Procedure Laterality Date  . Carotid stent      No family history on file.  History  Substance Use Topics  . Smoking status: Not on file  . Smokeless tobacco: Not on file  . Alcohol Use: Not on file    OB History   Grav Para Term Preterm Abortions TAB SAB Ect Mult Living                  Review of Systems All other systems reviewed and are negative Allergies  Review of patient's allergies indicates no known allergies.  Home Medications   Current Outpatient Rx  Name  Route  Sig  Dispense  Refill  . clopidogrel (PLAVIX) 75 MG tablet   Oral   Take 75 mg by mouth daily.         . diazepam (VALIUM) 5 MG tablet   Oral   Take 5 mg by mouth 2 (two) times daily.         . divalproex (DEPAKOTE) 500 MG DR tablet   Oral   Take 500 mg by mouth 2 (two) times daily.         . montelukast (SINGULAIR) 10 MG tablet   Oral   Take 10 mg by mouth every morning.         Marland Kitchen omeprazole (PRILOSEC) 20 MG capsule   Oral   Take 20 mg by mouth every morning.         . simvastatin (ZOCOR) 40 MG tablet   Oral   Take 40 mg by mouth at bedtime.         Marland Kitchen tiotropium (SPIRIVA) 18 MCG inhalation capsule   Inhalation   Place 18 mcg into inhaler and inhale daily.         . traZODone (DESYREL) 150 MG tablet   Oral   Take 150 mg by mouth at bedtime.         Marland Kitchen venlafaxine XR (EFFEXOR-XR) 150 MG 24 hr  capsule   Oral   Take 150 mg by mouth daily.           BP 95/69  Pulse 71  Temp(Src) 98.6 F (37 C) (Oral)  Resp 12  SpO2 95%  Physical Exam  Nursing note and vitals reviewed. Constitutional: She is oriented to person, place, and time. She appears well-developed and well-nourished. No distress.  HENT:  Head: Normocephalic and atraumatic.  Eyes: Pupils are equal, round, and reactive to light.  Neck: Normal range of motion.  Cardiovascular: Normal rate and intact distal pulses.   Pulmonary/Chest: No respiratory distress.  Abdominal: Normal appearance. She exhibits no distension. There is no tenderness.  Musculoskeletal: Normal range of motion.  Neurological: She is alert and oriented to person, place, and time. She has normal strength. No cranial nerve deficit. GCS eye subscore  is 4. GCS verbal subscore is 5. GCS motor subscore is 6.  Skin: Skin is warm and dry. No rash noted.  Psychiatric: She has a normal mood and affect. Her behavior is normal.    ED Course  Procedures (including critical care time)  Date: 01/04/2013  Rate: 76  Rhythm: normal sinus rhythm  QRS Axis: normal  Intervals: normal  ST/T Wave abnormalities: normal  Conduction Disutrbances: none  Narrative Interpretation: unremarkable     Labs Reviewed  BASIC METABOLIC PANEL - Abnormal; Notable for the following:    GFR calc non Af Amer 68 (*)    GFR calc Af Amer 79 (*)    All other components within normal limits  CBC WITH DIFFERENTIAL   Ct Head Wo Contrast  01/04/2013  *RADIOLOGY REPORT*  Clinical Data: Dizziness, history of multiple TIA and stroke, headache fall morning, past history hyperlipidemia, COPD  CT HEAD WITHOUT CONTRAST  Technique:  Contiguous axial images were obtained from the base of the skull through the vertex without contrast.  Comparison: 12/07/2009  Findings: Asymmetric positioning in gantry. Normal ventricular morphology. No midline shift or mass effect. Old right parietal infarct.  Mild small vessel chronic ischemic changes of deep cerebral white matter. No intracranial hemorrhage, mass lesion, or evidence of acute infarction. No extra-axial fluid collections. Bones and sinuses unremarkable.  IMPRESSION: Old right parietal infarct. Mild small vessel chronic ischemic changes of deep cerebral white matter. No acute intracranial abnormalities.   Original Report Authenticated By: Ulyses Southward, M.D.      1. TIA (transient ischemic attack)       MDM          Nelia Shi, MD 01/04/13 1820

## 2013-01-04 NOTE — H&P (Signed)
[facility] History and Physical  Kataleia Quaranta ZOX:[id number] DOB: 24-Jan-1956 DOA: 01/04/2013   PCP: Warrick Parisian, MD  Specialists: Jabier Gauss.  Chief Complaint: dizziness , left arm tingling since this am.   HPI: April [name] is a 57 y.o. female with h/o prior stroke, came in for persistent dizziness , headaches and left arm tingling started today. She reports her dizziness and headaches were since 2 months but her left arm tingling and numbness started this morning. On arrival to ED her arm altered sensorium resolved. She reports occasional cough , but no other symptoms. She continues to smoke cigarettes. She follows up with Dr Pearlean Brownie as outpatient for OLD CVA.  She is being admitted to medical service for further evaluation. Her inicial CT head neg for acute cva. Showed old parietal CVA.    Review of Systems: The patient denies anorexia, fever, weight loss,, vision loss, decreased hearing, hoarseness, chest pain, syncope, dyspnea on exertion, peripheral edema, balance deficits, hemoptysis, abdominal pain, melena, hematochezia, severe indigestion/heartburn, hematuria, incontinence, genital sores, muscle weakness, suspicious skin lesions, transient blindness, difficulty walking, depression, unusual weight change, abnormal bleeding, enlarged lymph nodes, angioedema, and breast masses.    Past Medical History  Diagnosis Date  . Stroke   . Hyperlipidemia   . COPD (chronic obstructive pulmonary disease)    Past Surgical History  Procedure Laterality Date  . Carotid stent     Social History:  has no tobacco, alcohol, and drug history on file. where does patient live--home, ?  No Known Allergies  No family history on file.   Prior to Admission medications   Medication Sig Start Date End Date Taking? Authorizing Provider  clopidogrel (PLAVIX) 75 MG tablet Take 75 mg by mouth daily.   Yes Historical Provider, MD  diazepam (VALIUM) 5 MG tablet Take 5 mg by mouth 2 (two) times  daily.   Yes Historical Provider, MD  divalproex (DEPAKOTE) 500 MG DR tablet Take 500 mg by mouth 2 (two) times daily.   Yes Historical Provider, MD  montelukast (SINGULAIR) 10 MG tablet Take 10 mg by mouth every morning.   Yes Historical Provider, MD  omeprazole (PRILOSEC) 20 MG capsule Take 20 mg by mouth every morning.   Yes Historical Provider, MD  simvastatin (ZOCOR) 40 MG tablet Take 40 mg by mouth at bedtime.   Yes Historical Provider, MD  tiotropium (SPIRIVA) 18 MCG inhalation capsule Place 18 mcg into inhaler and inhale daily.   Yes Historical Provider, MD  traZODone (DESYREL) 150 MG tablet Take 150 mg by mouth at bedtime.   Yes Historical Provider, MD  venlafaxine XR (EFFEXOR-XR) 150 MG 24 hr capsule Take 150 mg by mouth daily.   Yes Historical Provider, MD   Physical Exam: Filed Vitals:   01/04/13 1607 01/04/13 1615 01/04/13 1630 01/04/13 1645  BP: 115/71 106/67 110/63 95/69  Pulse: 76 75 73 71  Temp: 98.6 F (37 C)     TempSrc: Oral     Resp: 23 29 23 12   SpO2: 92% 92% 94% 95%    Constitutional: Vital signs reviewed.  Patient is a well-developed and well-nourished in no acute distress and cooperative with exam. Alert and oriented x3.  Head: Normocephalic and atraumatic Mouth: no erythema or exudates, MMM Eyes: PERRL, EOMI, conjunctivae normal, No scleral icterus.  Neck: Supple, Trachea midline normal ROM, No JVD, mass, thyromegaly, or carotid bruit present.  Cardiovascular: RRR, S1 normal, S2 normal, no MRG, pulses symmetric and intact bilaterally Pulmonary/Chest: CTAB, no wheezes, rales, or rhonchi  Abdominal: Soft. Non-tender, non-distended, bowel sounds are normal, no masses, organomegaly, or guarding present.   Musculoskeletal: No joint deformities, erythema, or stiffness, ROM full and no nontender Hematology: no cervical, inginal, or axillary adenopathy.  Neurological: A&O x3, Strength is normal and symmetric bilaterally, cranial nerve II-XII are grossly intact, no  focal motor deficit, sensory intact to light touch bilaterally.  Skin: Warm, dry and intact. No rash, cyanosis, or clubbing.  Psychiatric: Normal mood and affect.   Labs on Admission:  Basic Metabolic Panel:  Recent Labs Lab 01/04/13 1627  NA 141  K 3.6  CL 106  CO2 25  GLUCOSE 97  BUN 18  CREATININE 0.93  CALCIUM 9.2   Liver Function Tests: No results found for this basename: AST, ALT, ALKPHOS, BILITOT, PROT, ALBUMIN,  in the last 168 hours No results found for this basename: LIPASE, AMYLASE,  in the last 168 hours No results found for this basename: AMMONIA,  in the last 168 hours CBC:  Recent Labs Lab 01/04/13 1627  WBC 5.9  NEUTROABS 3.5  HGB 14.2  HCT 41.7  MCV 87.1  PLT 212   Cardiac Enzymes: No results found for this basename: CKTOTAL, CKMB, CKMBINDEX, TROPONINI,  in the last 168 hours  BNP (last 3 results) No results found for this basename: PROBNP,  in the last 8760 hours CBG: No results found for this basename: GLUCAP,  in the last 168 hours  Radiological Exams on Admission: Ct Head Wo Contrast  01/04/2013  *RADIOLOGY REPORT*  Clinical Data: Dizziness, history of multiple TIA and stroke, headache fall morning, past history hyperlipidemia, COPD  CT HEAD WITHOUT CONTRAST  Technique:  Contiguous axial images were obtained from the base of the skull through the vertex without contrast.  Comparison: 12/07/2009  Findings: Asymmetric positioning in gantry. Normal ventricular morphology. No midline shift or mass effect. Old right parietal infarct. Mild small vessel chronic ischemic changes of deep cerebral white matter. No intracranial hemorrhage, mass lesion, or evidence of acute infarction. No extra-axial fluid collections. Bones and sinuses unremarkable.  IMPRESSION: Old right parietal infarct. Mild small vessel chronic ischemic changes of deep cerebral white matter. No acute intracranial abnormalities.   Original Report Authenticated By: Ulyses Southward, M.D.     EKG:  NSR  Assessment/Plan Active Problems: 1. TIA;  - admit to telemetry - stroke work up including MRI/MRA head and neck, echo and carotid duplex,  - resume home plavix,  - neuro consulted by ED.   2. Smoker; nicotine patch ordered.   3. COPD: stable resume home spiriva.   4. Migraine headaches: on depakote. Currently no headache.   DVT prophylaxis     Code Status: FULL CODE Family Communication: none at bedside Disposition Plan: possibly in 1 to 2 days.     [facility] Pager [phone]  If 7PM-7AM, please contact night-coverage [email or web address] Password [facility] 01/04/2013, 7:38 PM

## 2013-01-04 NOTE — ED Notes (Signed)
Patient transported to CT 

## 2013-01-04 NOTE — ED Notes (Signed)
Dinner tray ordered. Spoke with Kipp Brood from service response.

## 2013-01-05 ENCOUNTER — Observation Stay (HOSPITAL_COMMUNITY): Payer: Medicare Other

## 2013-01-05 DIAGNOSIS — I6789 Other cerebrovascular disease: Secondary | ICD-10-CM

## 2013-01-05 DIAGNOSIS — R51 Headache: Secondary | ICD-10-CM

## 2013-01-05 DIAGNOSIS — R42 Dizziness and giddiness: Principal | ICD-10-CM

## 2013-01-05 DIAGNOSIS — G459 Transient cerebral ischemic attack, unspecified: Secondary | ICD-10-CM

## 2013-01-05 LAB — HEPATIC FUNCTION PANEL
ALT: 7 U/L (ref 0–35)
AST: 10 U/L (ref 0–37)
Alkaline Phosphatase: 17 U/L — ABNORMAL LOW (ref 39–117)
Total Protein: 6.3 g/dL (ref 6.0–8.3)

## 2013-01-05 MED ORDER — ACETAMINOPHEN 325 MG PO TABS
650.0000 mg | ORAL_TABLET | Freq: Four times a day (QID) | ORAL | Status: DC | PRN
  Filled 2013-01-05: qty 2

## 2013-01-05 MED ORDER — ACETAMINOPHEN 325 MG PO TABS
650.0000 mg | ORAL_TABLET | Freq: Once | ORAL | Status: AC
  Administered 2013-01-05: 650 mg via ORAL

## 2013-01-05 NOTE — Progress Notes (Signed)
SLP Cancellation Note  Patient Details Name: April [name] MRN: [id number] DOB: 1955-12-23   Cancelled treatment:       Reason Eval/Treat Not Completed: Patient at procedure or test/unavailable   Blenda Mounts Laurice 01/05/2013, 2:26 PM

## 2013-01-05 NOTE — Progress Notes (Signed)
Utilization review completed.  

## 2013-01-05 NOTE — Progress Notes (Signed)
EEG completed.

## 2013-01-05 NOTE — Progress Notes (Signed)
Subjective: Patient currently at baseline.  MRI of the brain performed and shows chronic ischemic changes, most prominent on the right.  No acute findings were noted.  EEG pending.    Objective: Current vital signs: BP 107/72  Pulse 70  Temp(Src) 98.1 F (36.7 C) (Oral)  Resp 18  Ht 5\' 5"  (1.651 m)  Wt 87.544 kg (193 lb)  BMI 32.12 kg/m2  SpO2 93% Vital signs in last 24 hours: Temp:  [97.7 F (36.5 C)-98.7 F (37.1 C)] 98.1 F (36.7 C) (03/12 1000) Pulse Rate:  [70-96] 70 (03/12 1000) Resp:  [12-29] 18 (03/12 1000) BP: (95-120)/(55-93) 107/72 mmHg (03/12 1000) SpO2:  [91 %-95 %] 93 % (03/12 1000) Weight:  [87.544 kg (193 lb)] 87.544 kg (193 lb) (03/11 2115)  Intake/Output from previous day:   Intake/Output this shift: Total I/O In: 240 [P.O.:240] Out: -  Nutritional status: General  Neurologic Exam: Mental Status:  Alert and oriented.  Speech fluent. Cranial Nerves:  II: Visual Fields are full. Pupils are equal, round, and reactive to light. Discs are difficult to visualize.  III,IV, VI: Mild left external deviation(patient reports old deficit)  V: Facial sensation is decreased on left  VII: Facial movement is decreased on left lower face.  VIII: hearing is intact to voice  X: Uvula elevates symmetrically  XI: Shoulder shrug is symmetric.  XII: tongue is midline  Motor:  Lifts all extremities against gravity.   Deep Tendon Reflexes:  2+ and symmetric in the biceps and patellae.  Cerebellar:  FNF intact bilaterally   Lab Results: Basic Metabolic Panel:  Recent Labs Lab 01/04/13 1627  NA 141  K 3.6  CL 106  CO2 25  GLUCOSE 97  BUN 18  CREATININE 0.93  CALCIUM 9.2    Liver Function Tests:  Recent Labs Lab 01/05/13 1136  AST 10  ALT 7  ALKPHOS 17*  BILITOT 0.3  PROT 6.3  ALBUMIN 3.1*   No results found for this basename: LIPASE, AMYLASE,  in the last 168 hours No results found for this basename: AMMONIA,  in the last 168  hours  CBC:  Recent Labs Lab 01/04/13 1627  WBC 5.9  NEUTROABS 3.5  HGB 14.2  HCT 41.7  MCV 87.1  PLT 212    Cardiac Enzymes: No results found for this basename: CKTOTAL, CKMB, CKMBINDEX, TROPONINI,  in the last 168 hours  Lipid Panel: No results found for this basename: CHOL, TRIG, HDL, CHOLHDL, VLDL, LDLCALC,  in the last 168 hours  CBG: No results found for this basename: GLUCAP,  in the last 168 hours  Microbiology: Results for orders placed during the hospital encounter of 12/26/09  MRSA PCR SCREENING     Status: None   Collection Time    12/26/09  2:06 PM      Result Value Range Status   MRSA by PCR    NEGATIVE Final   Value: NEGATIVE            The GeneXpert MRSA Assay (FDA     approved for NASAL specimens     only), is one component of a     comprehensive MRSA colonization     surveillance program. It is not     intended to diagnose MRSA     infection nor to guide or     monitor treatment for     MRSA infections.    Coagulation Studies: No results found for this basename: LABPROT, INR,  in the last 72 hours  Imaging: Dg Chest 2 View  01/05/2013  *RADIOLOGY REPORT*  Clinical Data: History of stroke.  Chest pain.  CHEST - 2 VIEW  Comparison: 12/24/2009  Findings: Heart size is normal.  There is no effusion or edema identified.  Diffuse bilateral reticular interstitial coarsening is identified. No superimposed airspace consolidation noted.  Review of the visualized osseous structures is unremarkable.  IMPRESSION:  1. Interstitial coarsening without evidence for superimposed airspace disease.   Original Report Authenticated By: Signa Kell, M.D.    Ct Head Wo Contrast  01/04/2013  *RADIOLOGY REPORT*  Clinical Data: Dizziness, history of multiple TIA and stroke, headache fall morning, past history hyperlipidemia, COPD  CT HEAD WITHOUT CONTRAST  Technique:  Contiguous axial images were obtained from the base of the skull through the vertex without contrast.   Comparison: 12/07/2009  Findings: Asymmetric positioning in gantry. Normal ventricular morphology. No midline shift or mass effect. Old right parietal infarct. Mild small vessel chronic ischemic changes of deep cerebral white matter. No intracranial hemorrhage, mass lesion, or evidence of acute infarction. No extra-axial fluid collections. Bones and sinuses unremarkable.  IMPRESSION: Old right parietal infarct. Mild small vessel chronic ischemic changes of deep cerebral white matter. No acute intracranial abnormalities.   Original Report Authenticated By: Ulyses Southward, M.D.    Mr Brain Wo Contrast  01/05/2013  *RADIOLOGY REPORT*  Clinical Data: Persistent dizziness, headaches and left arm tingling.  History of previous stroke.  MRI HEAD WITHOUT CONTRAST  Technique:  Multiplanar, multiecho pulse sequences of the brain and surrounding structures were obtained according to standard protocol without intravenous contrast.  Comparison: Head CT 01/04/2013.  MRI 12/05/2009.  Findings: Diffusion imaging does not show any acute or subacute infarction.  No brain stem stroke is seen.  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  This is of doubtful clinical significance.  No cerebellar insult.  The cerebral hemispheres show an old infarction in the right parietal region affecting the cortical and subcortical brain and old infarctions in the right hemispheric white matter extending from front to back, watershed type distribution.  There are chronic small vessel white matter infarctions in the left hemisphere.  No mass lesion, hemorrhage, hydrocephalus or extra- axial collection.  Diminished flow is evident in the carotid arteries bilaterally.  No pituitary mass.  No inflammatory sinus disease.  No skull or skull base lesion.  IMPRESSION: No acute infarction.  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  Old watershed ischemic changes right more than left as outlined  above.   Original Report Authenticated By: Paulina Fusi, M.D.     Medications:  I have reviewed the patient's current medications. Scheduled: . clopidogrel  75 mg Oral Daily  . diazepam  5 mg Oral BID  . divalproex  500 mg Oral BID  . enoxaparin (LOVENOX) injection  40 mg Subcutaneous Q24H  . montelukast  10 mg Oral q morning - 10a  . pantoprazole  40 mg Oral Daily  . simvastatin  40 mg Oral QHS  . tiotropium  18 mcg Inhalation Daily  . traZODone  150 mg Oral QHS  . venlafaxine XR  150 mg Oral Daily    Assessment/Plan: No evidence of acute intracerebral process.  Patient's symptoms may represent seizure versus complicated migraine.  Further work up pending.  Due to testing trough Depakote level was not drawn.    Recommendations: 1.  Depakote level today 2.  EEG pending.  No further anticonvulsants to  be started at this time.     LOS: 1 day   Thana Farr, MD [facility] [phone] 01/05/2013  1:07 PM

## 2013-01-05 NOTE — Progress Notes (Signed)
  Echocardiogram 2D Echocardiogram has been performed.  GREGORY, ANGELA 01/05/2013, 10:50 AM

## 2013-01-05 NOTE — Evaluation (Addendum)
Physical Therapy Evaluation Patient Details Name: April [name] MRN: [id number] DOB: 05-18-1956 Today's Date: 01/05/2013 Time: 4098-1191 PT Time Calculation (min): 22 min  PT Assessment / Plan / Recommendation Clinical Impression  pt admitted  with dizziness, HA and Left l arm tingling.  Most of the s/s have resolved, but pt's gait is still mildly unsteady.  Pt is likely at or approaching her baseline functioning and likely does not need any further PT.    PT Assessment  Patent does not need any further PT services    Follow Up Recommendations  No PT follow up    Does the patient have the potential to tolerate intense rehabilitation      Barriers to Discharge Decreased caregiver support      Equipment Recommendations  None recommended by PT    Recommendations for Other Services     Frequency      Precautions / Restrictions Precautions Precautions: Fall (mild risk) Restrictions Weight Bearing Restrictions: No   Pertinent Vitals/Pain       Mobility  Bed Mobility Bed Mobility: Supine to Sit;Sitting - Scoot to Edge of Bed Supine to Sit: 7: Independent Sitting - Scoot to Edge of Bed: 7: Independent Details for Bed Mobility Assistance: safe mobility Transfers Transfers: Sit to Stand;Stand to Sit Sit to Stand: 6: Modified independent (Device/Increase time) Stand to Sit: 6: Modified independent (Device/Increase time) Details for Transfer Assistance: safe mobility Ambulation/Gait Ambulation/Gait Assistance: 7: Independent;Other (comment) (in homelike env.) Ambulation Distance (Feet): 450 Feet Assistive device: None Ambulation/Gait Assistance Details: at times very steady gait and at others mildly unsteady with reports of dizziness.  Scanning L/R horiz.and vertically produces mild instability as well as abrupt turns.  Other challenges such as avoiding and stepping over obstacles did not produce instability Gait Pattern: Step-through pattern;Decreased step length -  right;Decreased step length - left;Decreased stride length Gait velocity: slowed Stairs: Yes Stairs Assistance: 6: Modified independent (Device/Increase time) Stair Management Technique: One rail Right;Alternating pattern;Step to pattern;Forwards Number of Stairs: 5 Wheelchair Mobility Wheelchair Mobility: No    Exercises     PT Diagnosis: Generalized weakness  PT Problem List: Decreased strength;Decreased activity tolerance;Decreased balance;Decreased mobility PT Treatment Interventions:     PT Goals    Visit Information  Last PT Received On: 01/05/13 Assistance Needed: +1    Subjective Data  Subjective: I want to go home, but I want to make sure I'm okay Patient Stated Goal: find out what's up with this HA, Get home.   Prior Functioning  Home Living Lives With: Alone Type of Home: House Home Access: Stairs to enter Home Layout: One level Firefighter: Standard Home Adaptive Equipment: None Prior Function Level of Independence: Independent Able to Take Stairs?: Yes Driving: Yes Communication Communication: No difficulties    Cognition  Cognition Overall Cognitive Status: Appears within functional limits for tasks assessed/performed Arousal/Alertness: Awake/alert Orientation Level: Appears intact for tasks assessed Behavior During Session: [facility] for tasks performed    Extremity/Trunk Assessment Right Upper Extremity Assessment RUE ROM/Strength/Tone: Within functional levels Left Upper Extremity Assessment LUE ROM/Strength/Tone: Within functional levels Right Lower Extremity Assessment RLE ROM/Strength/Tone: Within functional levels Left Lower Extremity Assessment LLE ROM/Strength/Tone: WFL for tasks assessed;Deficits LLE ROM/Strength/Tone Deficits: mildly weak at 4- to 4/5 Trunk Assessment Trunk Assessment: Normal   Balance Balance Balance Assessed: Yes Static Sitting Balance Static Sitting - Balance Support: Feet supported;No upper extremity  supported Static Sitting - Level of Assistance: 7: Independent  End of Session PT - End of Session Activity Tolerance:  Patient tolerated treatment well;Other (comment) (except got pt dizzy) Patient left: in chair;with call bell/phone within reach Nurse Communication: Mobility status  GP Functional Assessment Tool Used: clinical judgement Functional Limitation: Mobility: Walking and moving around Mobility: Walking and Moving Around Current Status ([id number]): At least 1 percent but less than 20 percent impaired, limited or restricted Mobility: Walking and Moving Around Goal Status [phone]): At least 1 percent but less than 20 percent impaired, limited or restricted Mobility: Walking and Moving Around Discharge Status [phone]): At least 1 percent but less than 20 percent impaired, limited or restricted   Mottinger, Eliseo Gum 01/05/2013, 4:45 PM 01/05/2013  [place], PT [phone] (pager)

## 2013-01-05 NOTE — Progress Notes (Signed)
Pt requesting to be DNR status. MD on-call paged and notified.  Harless Litten, RN 01/05/13

## 2013-01-05 NOTE — Progress Notes (Signed)
[facility] PROGRESS NOTE  Loann Chahal WJX:[id number] DOB: 01-19-1956 DOA: 01/04/2013  PCP: Warrick Parisian, MD  Brief HPI: April [name] is a 57 y.o. female with h/o prior stroke, who came in for persistent dizziness, headaches and left arm tingling that started on the day of admission. She reported her dizziness and headaches were since 2 months but her left arm tingling and numbness started that morning. On arrival to ED her arm symptoms had resolved. She reports occasional cough, but no other symptoms. She continues to smoke cigarettes. She follows up with Dr Pearlean Brownie as outpatient for OLD CVA. She is being admitted to medical service for further evaluation. Her inicial CT head neg for acute cva. Showed old parietal CVA.   Past medical history:  Past Medical History  Diagnosis Date  . Stroke   . Hyperlipidemia   . COPD (chronic obstructive pulmonary disease)     Consultants: Neuro  Procedures: None  Antibiotics: None  Subjective: Patient complains of a headache and face pain. Located on both sides of head. No vision disturbances. No other complaints.  Objective: Vital Signs  Filed Vitals:   01/05/13 0200 01/05/13 0400 01/05/13 0600 01/05/13 0800  BP: 106/93 110/90 98/65 105/66  Pulse: 96 90 71 72  Temp:   98.7 F (37.1 C) 98.6 F (37 C)  TempSrc:    Oral  Resp:   18 18  Height:      Weight:      SpO2:   91% 92%    Intake/Output Summary (Last 24 hours) at 01/05/13 2130 Last data filed at 01/05/13 0809  Gross per 24 hour  Intake    240 ml  Output      0 ml  Net    240 ml   Filed Weights   01/04/13 2115  Weight: 87.544 kg (193 lb)    General appearance: alert, cooperative, appears stated age and no distress Head: Normocephalic, without obvious abnormality, atraumatic Eyes: conjunctivae/corneas clear. PERRL, EOM's intact.  Throat: lips, mucosa, and tongue normal; teeth and gums normal Resp: clear to auscultation bilaterally Cardio: regular rate and  rhythm, S1, S2 normal, no murmur, click, rub or gallop GI: soft, non-tender; bowel sounds normal; no masses,  no organomegaly Extremities: extremities normal, atraumatic, no cyanosis or edema Pulses: 2+ and symmetric Skin: Skin color, texture, turgor normal. No rashes or lesions Lymph nodes: Cervical, supraclavicular, and axillary nodes normal. Neurologic: Alert and oriented X 3, Left sided weakness noted upper and lower ext. No cranial nerve deficits.  Lab Results:  Basic Metabolic Panel:  Recent Labs Lab 01/04/13 1627  NA 141  K 3.6  CL 106  CO2 25  GLUCOSE 97  BUN 18  CREATININE 0.93  CALCIUM 9.2   CBC:  Recent Labs Lab 01/04/13 1627  WBC 5.9  NEUTROABS 3.5  HGB 14.2  HCT 41.7  MCV 87.1  PLT 212     Studies/Results: Ct Head Wo Contrast  01/04/2013  *RADIOLOGY REPORT*  Clinical Data: Dizziness, history of multiple TIA and stroke, headache fall morning, past history hyperlipidemia, COPD  CT HEAD WITHOUT CONTRAST  Technique:  Contiguous axial images were obtained from the base of the skull through the vertex without contrast.  Comparison: 12/07/2009  Findings: Asymmetric positioning in gantry. Normal ventricular morphology. No midline shift or mass effect. Old right parietal infarct. Mild small vessel chronic ischemic changes of deep cerebral white matter. No intracranial hemorrhage, mass lesion, or evidence of acute infarction. No extra-axial fluid collections. Bones and sinuses unremarkable.  IMPRESSION: Old right parietal infarct. Mild small vessel chronic ischemic changes of deep cerebral white matter. No acute intracranial abnormalities.   Original Report Authenticated By: Ulyses Southward, M.D.     Medications:  Scheduled: . acetaminophen  650 mg Oral Once  . clopidogrel  75 mg Oral Daily  . diazepam  5 mg Oral BID  . divalproex  500 mg Oral BID  . enoxaparin (LOVENOX) injection  40 mg Subcutaneous Q24H  . montelukast  10 mg Oral q morning - 10a  . pantoprazole  40  mg Oral Daily  . simvastatin  40 mg Oral QHS  . tiotropium  18 mcg Inhalation Daily  . traZODone  150 mg Oral QHS  . venlafaxine XR  150 mg Oral Daily   Continuous:  ZOX:[id number], ondansetron (ZOFRAN) IV, senna-docusate  Assessment/Plan:  Active Problems:   Headache   Dizziness   TIA (transient ischemic attack)   Migraine   COPD (chronic obstructive pulmonary disease)    Possible TIA Symptoms resolved. Old deficits noted on examination. MRI and ECHO pending. Neuro following. Plavix. Not orthostatic.  Headaches Try Tylenol. Depakote level pending to see if dose can be increased.  Tobacco Abuse Nicotine patch ordered.   History of COPD Stable. On Spiriva.   DVT prophylaxis  Enoxaparin  Code Status: FULL CODE  Family Communication: none at bedside  Disposition Plan: Await MRI. PT/OT to ambulate.   LOS: 1 day   [facility]  [facility] Pager [phone] 01/05/2013, 8:33 AM  If 8PM-8AM, please contact night-coverage at [email or web address], password [facility]

## 2013-01-06 ENCOUNTER — Encounter (HOSPITAL_COMMUNITY): Payer: Self-pay | Admitting: General Practice

## 2013-01-06 DIAGNOSIS — J449 Chronic obstructive pulmonary disease, unspecified: Secondary | ICD-10-CM

## 2013-01-06 LAB — BASIC METABOLIC PANEL
BUN: 17 mg/dL (ref 6–23)
CO2: 26 mEq/L (ref 19–32)
Calcium: 9 mg/dL (ref 8.4–10.5)
Creatinine, Ser: 0.97 mg/dL (ref 0.50–1.10)
GFR calc non Af Amer: 65 mL/min — ABNORMAL LOW (ref >90)
Glucose, Bld: 89 mg/dL (ref 70–99)
Sodium: 143 mEq/L (ref 135–145)

## 2013-01-06 LAB — CBC
HCT: 39.8 % (ref 36.0–46.0)
Hemoglobin: 13.4 g/dL (ref 12.0–15.0)
MCH: 28.6 pg (ref 26.0–34.0)
MCHC: 33.7 g/dL (ref 30.0–36.0)
MCV: 84.9 fL (ref 78.0–100.0)
RBC: 4.69 MIL/uL (ref 3.87–5.11)

## 2013-01-06 MED ORDER — DIVALPROEX SODIUM 250 MG PO DR TAB: 750.0000 mg | DELAYED_RELEASE_TABLET | Freq: Two times a day (BID) | ORAL | Status: DC

## 2013-01-06 NOTE — Progress Notes (Signed)
OT Cancellation Note  Patient Details Name: April [name] MRN: [id number] DOB: 02-16-1956   Cancelled Treatment:    Reason Eval/Treat Not Completed: Other (comment) (Pt reports no needs, will sign off OT at this time). RN made aware of pt request for Case Management consult and will address w/ pt per her report.  [name] 01/06/2013, 8:08 AM

## 2013-01-06 NOTE — Progress Notes (Signed)
Subjective: Patient reports that her right arm is numb today.  Feels that is secondary to her sleeping on it last night.  She continues to try to work the numbness out.  MRI of the brain showed no evidence of acute infarction.  EEG was unremarkable as well.  Headache has resolved.    Objective: Current vital signs: BP 108/72  Pulse 69  Temp(Src) 98.2 F (36.8 C) (Oral)  Resp 18  Ht 5\' 5"  (1.651 m)  Wt 87.544 kg (193 lb)  BMI 32.12 kg/m2  SpO2 93% Vital signs in last 24 hours: Temp:  [97.8 F (36.6 C)-98.4 F (36.9 C)] 98.2 F (36.8 C) (03/13 0800) Pulse Rate:  [65-96] 69 (03/13 0800) Resp:  [16-18] 18 (03/13 0800) BP: (91-108)/(57-72) 108/72 mmHg (03/13 0800) SpO2:  [93 %-100 %] 93 % (03/13 0800)  Intake/Output from previous day: 03/12 0701 - 03/13 0700 In: 1080 [P.O.:1080] Out: -  Intake/Output this shift:   Nutritional status: General  Neurologic Exam: Mental Status:  Alert and oriented. Speech fluent.  Cranial Nerves:  II: Visual Fields are full. Pupils are equal, round, and reactive to light. Discs are difficult to visualize.  III,IV, VI: Mild left external deviation(patient reports old deficit)  V: Facial sensation is decreased on left  VII: Facial movement is decreased on left lower face.  VIII: hearing is intact to voice  X: Uvula elevates symmetrically  XI: Shoulder shrug is symmetric.  XII: tongue is midline  Motor:  Lifts all extremities against gravity.  Sensory: Decreased sensation in the fingertips on the left hand sparing the thumb and in the entire hand on the right. Deep Tendon Reflexes:  2+ and symmetric in the biceps and patellae.  Cerebellar:  FNF intact bilaterally    Lab Results: Basic Metabolic Panel:  Recent Labs Lab 01/04/13 1627 01/06/13 0648  NA 141 143  K 3.6 3.5  CL 106 106  CO2 25 26  GLUCOSE 97 89  BUN 18 17  CREATININE 0.93 0.97  CALCIUM 9.2 9.0    Liver Function Tests:  Recent Labs Lab 01/05/13 1136  AST 10   ALT 7  ALKPHOS 17*  BILITOT 0.3  PROT 6.3  ALBUMIN 3.1*   No results found for this basename: LIPASE, AMYLASE,  in the last 168 hours No results found for this basename: AMMONIA,  in the last 168 hours  CBC:  Recent Labs Lab 01/04/13 1627 01/06/13 0648  WBC 5.9 6.3  NEUTROABS 3.5  --   HGB 14.2 13.4  HCT 41.7 39.8  MCV 87.1 84.9  PLT 212 201    Cardiac Enzymes: No results found for this basename: CKTOTAL, CKMB, CKMBINDEX, TROPONINI,  in the last 168 hours  Lipid Panel: No results found for this basename: CHOL, TRIG, HDL, CHOLHDL, VLDL, LDLCALC,  in the last 168 hours  CBG: No results found for this basename: GLUCAP,  in the last 168 hours  Microbiology: Results for orders placed during the hospital encounter of 12/26/09  MRSA PCR SCREENING     Status: None   Collection Time    12/26/09  2:06 PM      Result Value Range Status   MRSA by PCR    NEGATIVE Final   Value: NEGATIVE            The GeneXpert MRSA Assay (FDA     approved for NASAL specimens     only), is one component of a     comprehensive MRSA colonization  surveillance program. It is not     intended to diagnose MRSA     infection nor to guide or     monitor treatment for     MRSA infections.    Coagulation Studies: No results found for this basename: LABPROT, INR,  in the last 72 hours  Imaging: Dg Chest 2 View  01/05/2013  *RADIOLOGY REPORT*  Clinical Data: History of stroke.  Chest pain.  CHEST - 2 VIEW  Comparison: 12/24/2009  Findings: Heart size is normal.  There is no effusion or edema identified.  Diffuse bilateral reticular interstitial coarsening is identified. No superimposed airspace consolidation noted.  Review of the visualized osseous structures is unremarkable.  IMPRESSION:  1. Interstitial coarsening without evidence for superimposed airspace disease.   Original Report Authenticated By: Signa Kell, M.D.    Ct Head Wo Contrast  01/04/2013  *RADIOLOGY REPORT*  Clinical Data:  Dizziness, history of multiple TIA and stroke, headache fall morning, past history hyperlipidemia, COPD  CT HEAD WITHOUT CONTRAST  Technique:  Contiguous axial images were obtained from the base of the skull through the vertex without contrast.  Comparison: 12/07/2009  Findings: Asymmetric positioning in gantry. Normal ventricular morphology. No midline shift or mass effect. Old right parietal infarct. Mild small vessel chronic ischemic changes of deep cerebral white matter. No intracranial hemorrhage, mass lesion, or evidence of acute infarction. No extra-axial fluid collections. Bones and sinuses unremarkable.  IMPRESSION: Old right parietal infarct. Mild small vessel chronic ischemic changes of deep cerebral white matter. No acute intracranial abnormalities.   Original Report Authenticated By: Ulyses Southward, M.D.    Mr Brain Wo Contrast  01/05/2013  *RADIOLOGY REPORT*  Clinical Data: Persistent dizziness, headaches and left arm tingling.  History of previous stroke.  MRI HEAD WITHOUT CONTRAST  Technique:  Multiplanar, multiecho pulse sequences of the brain and surrounding structures were obtained according to standard protocol without intravenous contrast.  Comparison: Head CT 01/04/2013.  MRI 12/05/2009.  Findings: Diffusion imaging does not show any acute or subacute infarction.  No brain stem stroke is seen.  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  This is of doubtful clinical significance.  No cerebellar insult.  The cerebral hemispheres show an old infarction in the right parietal region affecting the cortical and subcortical brain and old infarctions in the right hemispheric white matter extending from front to back, watershed type distribution.  There are chronic small vessel white matter infarctions in the left hemisphere.  No mass lesion, hemorrhage, hydrocephalus or extra- axial collection.  Diminished flow is evident in the carotid arteries  bilaterally.  No pituitary mass.  No inflammatory sinus disease.  No skull or skull base lesion.  IMPRESSION: No acute infarction.  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  Old watershed ischemic changes right more than left as outlined above.   Original Report Authenticated By: Paulina Fusi, M.D.     Medications:  I have reviewed the patient's current medications. Scheduled: . clopidogrel  75 mg Oral Daily  . diazepam  5 mg Oral BID  . divalproex  500 mg Oral BID  . enoxaparin (LOVENOX) injection  40 mg Subcutaneous Q24H  . montelukast  10 mg Oral q morning - 10a  . pantoprazole  40 mg Oral Daily  . simvastatin  40 mg Oral QHS  . tiotropium  18 mcg Inhalation Daily  . traZODone  150 mg Oral QHS  . venlafaxine XR  150  mg Oral Daily    Assessment/Plan: Patient shows no evidence of acute infarct.  Headache has resolved.  Depakote level of 53.3.    Recommendations: 1.  Increase Depakote to 1250mg  BID 2.  Patient may have imaging performed of the cervical spine on an outpatient basis.     LOS: 2 days   Thana Farr, MD [facility] [phone] 01/06/2013  9:39 AM

## 2013-01-06 NOTE — Discharge Summary (Signed)
[facility]  Physician Discharge Summary   Patient ID: April [name] MRN: [id number] DOB/AGE: 06/15/1956 57 y.o.  Admit date: 01/04/2013 Discharge date: 01/06/2013  PCP: Warrick Parisian, MD  DISCHARGE DIAGNOSES:  Active Problems:   Headache   Dizziness   TIA (transient ischemic attack)   Migraine   COPD (chronic obstructive pulmonary disease)   RECOMMENDATIONS FOR OUTPATIENT FOLLOW UP: 1. Depakote dose was increased during this hospitalization  DISCHARGE CONDITION: fair  Diet recommendation: Heart Healthy  Filed Weights   01/04/13 2115  Weight: 87.544 kg (193 lb)    INITIAL HISTORY: April [name] is a 57 y.o. female with h/o prior stroke, who came in for persistent dizziness, headaches and left arm tingling that started on the day of admission. She reported her dizziness and headaches were present since 2 months but her left arm tingling and numbness started that morning. On arrival to ED her arm symptoms had resolved. She reported occasional cough, but no other symptoms. She continues to smoke cigarettes. She follows up with Dr Pearlean Brownie as outpatient for OLD CVA. She was admitted to medical service for further evaluation. Her inicial CT head neg for acute cva. Showed old parietal CVA.   Consultations:  Neurology  Procedures:  2D ECHO 3/11 Study Conclusions  - Left ventricle: The cavity size was normal. Wall thickness was normal. Systolic function was normal. The estimated ejection fraction was in the range of 55% to 60%. Although no diagnostic regional wall motion abnormality was identified, this possibility cannot be completely excluded on the basis of this study. Doppler parameters are consistent with abnormal left ventricular relaxation (grade 1 diastolic dysfunction). - Aortic valve: There was no stenosis. - Mitral valve: Trivial regurgitation. - Right ventricle: The cavity size was normal. Systolic function was normal. - Pulmonary arteries: No complete TR  doppler jet so unable to estimate PA systolic pressure. - Systemic veins: IVC poorly visualized. Impressions:- Normal LV size with EF 55-60%. Normal RV size and systolic function. No significant valvular abnormalities.  EEG 3/12 Normal EEG  HOSPITAL COURSE:   Possible TIA  Patient was admitted for TIA like symptoms. The arm symptoms had resolved in the ED. She underwent a MRI which showed old findings. No new stroke was seen. Se underwent an ECHo which revealed Grade 1 diastolic dysfunction. Normal systolic function. Plavis to be continued. Can follow up with Dr. Pearlean Brownie as needed.  Headaches  She does have a history of migraine headaches. MRI did not show any concerning findings. Depakote level was checked and was 53. Dose of Depakote will be increased per Neuro recommendations. Follow up with PCP.  Tobacco Abuse  She was counseled to stop smoking. Nicotine patch ordered in the hospital.  History of COPD  Stable on Spiriva.   Overall patient has done well. She reports lot of other symptoms such as throat pain, ear pain, ringing in ear, all of which have been ongoing for 6 months. I have asked her to follow up with her PCP and maybe get a referral to ENT if indicated. She is stable for discharge today.    PERTINENT LABS:  The results of significant diagnostics from this hospitalization (including imaging, microbiology, ancillary and laboratory) are listed below for reference.     Labs: Basic Metabolic Panel:  Recent Labs Lab 01/04/13 1627 01/06/13 0648  NA 141 143  K 3.6 3.5  CL 106 106  CO2 25 26  GLUCOSE 97 89  BUN 18 17  CREATININE 0.93 0.97  CALCIUM 9.2 9.0  Liver Function Tests:  Recent Labs Lab 01/05/13 1136  AST 10  ALT 7  ALKPHOS 17*  BILITOT 0.3  PROT 6.3  ALBUMIN 3.1*   CBC:  Recent Labs Lab 01/04/13 1627 01/06/13 0648  WBC 5.9 6.3  NEUTROABS 3.5  --   HGB 14.2 13.4  HCT 41.7 39.8  MCV 87.1 84.9  PLT 212 201    IMAGING STUDIES Dg  Chest 2 View  01/05/2013  *RADIOLOGY REPORT*  Clinical Data: History of stroke.  Chest pain.  CHEST - 2 VIEW  Comparison: 12/24/2009  Findings: Heart size is normal.  There is no effusion or edema identified.  Diffuse bilateral reticular interstitial coarsening is identified. No superimposed airspace consolidation noted.  Review of the visualized osseous structures is unremarkable.  IMPRESSION:  1. Interstitial coarsening without evidence for superimposed airspace disease.   Original Report Authenticated By: Signa Kell, M.D.    Ct Head Wo Contrast  01/04/2013  *RADIOLOGY REPORT*  Clinical Data: Dizziness, history of multiple TIA and stroke, headache fall morning, past history hyperlipidemia, COPD  CT HEAD WITHOUT CONTRAST  Technique:  Contiguous axial images were obtained from the base of the skull through the vertex without contrast.  Comparison: 12/07/2009  Findings: Asymmetric positioning in gantry. Normal ventricular morphology. No midline shift or mass effect. Old right parietal infarct. Mild small vessel chronic ischemic changes of deep cerebral white matter. No intracranial hemorrhage, mass lesion, or evidence of acute infarction. No extra-axial fluid collections. Bones and sinuses unremarkable.  IMPRESSION: Old right parietal infarct. Mild small vessel chronic ischemic changes of deep cerebral white matter. No acute intracranial abnormalities.   Original Report Authenticated By: Ulyses Southward, M.D.    Mr Brain Wo Contrast  01/05/2013  *RADIOLOGY REPORT*  Clinical Data: Persistent dizziness, headaches and left arm tingling.  History of previous stroke.  MRI HEAD WITHOUT CONTRAST  Technique:  Multiplanar, multiecho pulse sequences of the brain and surrounding structures were obtained according to standard protocol without intravenous contrast.  Comparison: Head CT 01/04/2013.  MRI 12/05/2009.  Findings: Diffusion imaging does not show any acute or subacute infarction.  No brain stem stroke is seen.   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  This is of doubtful clinical significance.  No cerebellar insult.  The cerebral hemispheres show an old infarction in the right parietal region affecting the cortical and subcortical brain and old infarctions in the right hemispheric white matter extending from front to back, watershed type distribution.  There are chronic small vessel white matter infarctions in the left hemisphere.  No mass lesion, hemorrhage, hydrocephalus or extra- axial collection.  Diminished flow is evident in the carotid arteries bilaterally.  No pituitary mass.  No inflammatory sinus disease.  No skull or skull base lesion.  IMPRESSION: No acute infarction.  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  Old watershed ischemic changes right more than left as outlined above.   Original Report Authenticated By: Paulina Fusi, M.D.     DISCHARGE EXAMINATION: Filed Vitals:   01/05/13 2000 01/06/13 0000 01/06/13 0400 01/06/13 0800  BP: 91/59 97/62 97/57  108/72  Pulse: 96 66 65 69  Temp: 97.9 F (36.6 C) 97.8 F (36.6 C) 98.4 F (36.9 C) 98.2 F (36.8 C)  TempSrc:    Oral  Resp: 18 16 18 18   Height:      Weight:      SpO2: 100% 96% 93% 93%   General appearance: alert,  cooperative, appears stated age and no distress Resp: clear to auscultation bilaterally Cardio: regular rate and rhythm, S1, S2 normal, no murmur, click, rub or gallop GI: soft, non-tender; bowel sounds normal; no masses,  no organomegaly Neurologic: Alert and oriented X 3, normal strength and tone. Normal symmetric reflexes. Normal coordination and gait  DISPOSITION: Home  Discharge Orders   Future Orders Complete By Expires     Diet - low sodium heart healthy  As directed     Discharge instructions  As directed     Comments:      Please follow up with your PCP for your other symptoms.    Increase activity slowly  As directed       Current  Discharge Medication List    CONTINUE these medications which have CHANGED   Details  divalproex (DEPAKOTE) 250 MG DR tablet Take 3 tablets (750 mg total) by mouth 2 (two) times daily. Qty: 180 tablet, Refills: 1      CONTINUE these medications which have NOT CHANGED   Details  clopidogrel (PLAVIX) 75 MG tablet Take 75 mg by mouth daily.    diazepam (VALIUM) 5 MG tablet Take 5 mg by mouth 2 (two) times daily.    montelukast (SINGULAIR) 10 MG tablet Take 10 mg by mouth every morning.    omeprazole (PRILOSEC) 20 MG capsule Take 20 mg by mouth every morning.    simvastatin (ZOCOR) 40 MG tablet Take 40 mg by mouth at bedtime.    tiotropium (SPIRIVA) 18 MCG inhalation capsule Place 18 mcg into inhaler and inhale daily.    traZODone (DESYREL) 150 MG tablet Take 150 mg by mouth at bedtime.    venlafaxine XR (EFFEXOR-XR) 150 MG 24 hr capsule Take 150 mg by mouth daily.       Follow-up Information   Follow up with Warrick Parisian, MD. Schedule an appointment as soon as possible for a visit in 1 week. (post hospitalization follow up)    Contact information:   [address] [place] [address] [phone]       Call Gates Rigg, MD. (As needed)    Contact information:   [address] [facility] [place] [address] [phone]       TOTAL DISCHARGE TIME: 35 mins  [facility]  [facility] Pager [phone]  01/06/2013, 8:42 AM

## 2013-01-06 NOTE — Procedures (Signed)
ELECTROENCEPHALOGRAM REPORT   Patient: April [name]       Room #: 4U98 EEG No. ID: [id number] Age: 57 y.o.        Sex: female Referring Physician: Amada Jupiter Report Date:  01/06/2013        Interpreting Physician: Thana Farr D  History: April [name] is an 57 y.o. female with episodes of dizziness, leg weakness and headache  Medications:  I have reviewed the patient's current medications. Scheduled: . clopidogrel  75 mg Oral Daily  . diazepam  5 mg Oral BID  . divalproex  500 mg Oral BID  . enoxaparin (LOVENOX) injection  40 mg Subcutaneous Q24H  . montelukast  10 mg Oral q morning - 10a  . pantoprazole  40 mg Oral Daily  . simvastatin  40 mg Oral QHS  . tiotropium  18 mcg Inhalation Daily  . traZODone  150 mg Oral QHS  . venlafaxine XR  150 mg Oral Daily    Conditions of Recording:  This is a 16 channel EEG carried out with the patient in the awake state.  Description:  The waking background activity consists of a low voltage, symmetrical, fairly well organized, 9-10 Hz alpha activity, seen from the parieto-occipital and posterior temporal regions.  Low voltage fast activity, poorly organized, is seen anteriorly and is at times superimposed on more posterior regions.  A mixture of theta and alpha rhythms are seen from the central and temporal regions. The patient does not drowse or sleep. Hyperventilation produced a mild to moderate buildup but failed to elicit any abnormalities.  Intermittent photic stimulation was performed but failed to illicit any change in the tracing.    IMPRESSION: This is a normal awake EEG  Comment:  An EEG with the patient sleep deprived to elicit drowse and light sleep may be desirable to further elicit a possible seizure disorder.     Thana Farr, MD [facility] [phone] 01/06/2013, 7:12 AM

## 2013-12-23 ENCOUNTER — Emergency Department (HOSPITAL_COMMUNITY)
Admission: EM | Admit: 2013-12-23 | Discharge: 2013-12-24 | Disposition: A | Discharge status: Home / Self Care | Payer: Medicare Other | Attending: Emergency Medicine | Admitting: Emergency Medicine

## 2013-12-23 ENCOUNTER — Emergency Department (HOSPITAL_COMMUNITY): Payer: Medicare Other

## 2013-12-23 ENCOUNTER — Encounter (HOSPITAL_COMMUNITY): Payer: Self-pay | Admitting: Emergency Medicine

## 2013-12-23 DIAGNOSIS — F3289 Other specified depressive episodes: Secondary | ICD-10-CM | POA: Insufficient documentation

## 2013-12-23 DIAGNOSIS — F17200 Nicotine dependence, unspecified, uncomplicated: Secondary | ICD-10-CM | POA: Insufficient documentation

## 2013-12-23 DIAGNOSIS — R531 Weakness: Secondary | ICD-10-CM

## 2013-12-23 DIAGNOSIS — I2510 Atherosclerotic heart disease of native coronary artery without angina pectoris: Secondary | ICD-10-CM | POA: Insufficient documentation

## 2013-12-23 DIAGNOSIS — F411 Generalized anxiety disorder: Secondary | ICD-10-CM | POA: Insufficient documentation

## 2013-12-23 DIAGNOSIS — Z8673 Personal history of transient ischemic attack (TIA), and cerebral infarction without residual deficits: Secondary | ICD-10-CM | POA: Insufficient documentation

## 2013-12-23 DIAGNOSIS — J449 Chronic obstructive pulmonary disease, unspecified: Secondary | ICD-10-CM | POA: Insufficient documentation

## 2013-12-23 DIAGNOSIS — R5381 Other malaise: Secondary | ICD-10-CM | POA: Insufficient documentation

## 2013-12-23 DIAGNOSIS — R209 Unspecified disturbances of skin sensation: Secondary | ICD-10-CM | POA: Insufficient documentation

## 2013-12-23 DIAGNOSIS — Z8739 Personal history of other diseases of the musculoskeletal system and connective tissue: Secondary | ICD-10-CM | POA: Insufficient documentation

## 2013-12-23 DIAGNOSIS — Z7902 Long term (current) use of antithrombotics/antiplatelets: Secondary | ICD-10-CM | POA: Insufficient documentation

## 2013-12-23 DIAGNOSIS — F329 Major depressive disorder, single episode, unspecified: Secondary | ICD-10-CM | POA: Insufficient documentation

## 2013-12-23 DIAGNOSIS — R5383 Other fatigue: Principal | ICD-10-CM

## 2013-12-23 DIAGNOSIS — K219 Gastro-esophageal reflux disease without esophagitis: Secondary | ICD-10-CM | POA: Insufficient documentation

## 2013-12-23 DIAGNOSIS — J4489 Other specified chronic obstructive pulmonary disease: Secondary | ICD-10-CM | POA: Insufficient documentation

## 2013-12-23 DIAGNOSIS — G43909 Migraine, unspecified, not intractable, without status migrainosus: Secondary | ICD-10-CM | POA: Insufficient documentation

## 2013-12-23 DIAGNOSIS — E785 Hyperlipidemia, unspecified: Secondary | ICD-10-CM | POA: Insufficient documentation

## 2013-12-23 DIAGNOSIS — Z79899 Other long term (current) drug therapy: Secondary | ICD-10-CM | POA: Insufficient documentation

## 2013-12-23 DIAGNOSIS — Z9861 Coronary angioplasty status: Secondary | ICD-10-CM | POA: Insufficient documentation

## 2013-12-23 LAB — CBC WITH DIFFERENTIAL/PLATELET
BASOS ABS: 0 10*3/uL (ref 0.0–0.1)
BASOS PCT: 0 % (ref 0–1)
EOS ABS: 0.1 10*3/uL (ref 0.0–0.7)
EOS PCT: 1 % (ref 0–5)
HCT: 42.1 % (ref 36.0–46.0)
Hemoglobin: 14 g/dL (ref 12.0–15.0)
Lymphocytes Relative: 41 % (ref 12–46)
Lymphs Abs: 2.9 10*3/uL (ref 0.7–4.0)
MCH: 29.5 pg (ref 26.0–34.0)
MCHC: 33.3 g/dL (ref 30.0–36.0)
MCV: 88.8 fL (ref 78.0–100.0)
Monocytes Absolute: 0.7 10*3/uL (ref 0.1–1.0)
Monocytes Relative: 10 % (ref 3–12)
Neutro Abs: 3.4 10*3/uL (ref 1.7–7.7)
Neutrophils Relative %: 48 % (ref 43–77)
PLATELETS: 175 10*3/uL (ref 150–400)
RBC: 4.74 MIL/uL (ref 3.87–5.11)
RDW: 14.4 % (ref 11.5–15.5)
WBC: 7.1 10*3/uL (ref 4.0–10.5)

## 2013-12-23 LAB — COMPREHENSIVE METABOLIC PANEL
ALT: 8 U/L (ref 0–35)
AST: 12 U/L (ref 0–37)
Albumin: 2.9 g/dL — ABNORMAL LOW (ref 3.5–5.2)
Alkaline Phosphatase: 21 U/L — ABNORMAL LOW (ref 39–117)
BUN: 13 mg/dL (ref 6–23)
CALCIUM: 8.8 mg/dL (ref 8.4–10.5)
CO2: 30 mEq/L (ref 19–32)
CREATININE: 0.95 mg/dL (ref 0.50–1.10)
Chloride: 102 mEq/L (ref 96–112)
GFR calc non Af Amer: 66 mL/min — ABNORMAL LOW (ref >90)
GFR, EST AFRICAN AMERICAN: 77 mL/min — AB (ref >90)
Glucose, Bld: 80 mg/dL (ref 70–99)
Potassium: 4 mEq/L (ref 3.7–5.3)
SODIUM: 143 meq/L (ref 137–147)
TOTAL PROTEIN: 6.7 g/dL (ref 6.0–8.3)
Total Bilirubin: 0.4 mg/dL (ref 0.3–1.2)

## 2013-12-23 MED ORDER — DIPHENHYDRAMINE HCL 50 MG/ML IJ SOLN
25.0000 mg | Freq: Once | INTRAMUSCULAR | Status: AC
  Administered 2013-12-23: 25 mg via INTRAVENOUS
  Filled 2013-12-23: qty 1

## 2013-12-23 MED ORDER — PROCHLORPERAZINE EDISYLATE 5 MG/ML IJ SOLN
10.0000 mg | Freq: Four times a day (QID) | INTRAMUSCULAR | Status: DC | PRN
  Administered 2013-12-23: 10 mg via INTRAVENOUS
  Filled 2013-12-23: qty 2

## 2013-12-23 MED ORDER — SODIUM CHLORIDE 0.9 % IV BOLUS (SEPSIS)
1000.0000 mL | Freq: Once | INTRAVENOUS | Status: AC
  Administered 2013-12-23: 1000 mL via INTRAVENOUS

## 2013-12-23 MED ORDER — KETOROLAC TROMETHAMINE 30 MG/ML IJ SOLN
30.0000 mg | Freq: Once | INTRAMUSCULAR | Status: AC
  Administered 2013-12-23: 30 mg via INTRAVENOUS
  Filled 2013-12-23: qty 1

## 2013-12-23 NOTE — ED Notes (Signed)
The patient is unable to give urine specimen at this time. The patient has been advised to use call light for assistance to the restroom. The tech has reported to the RN in charge. 

## 2013-12-23 NOTE — ED Provider Notes (Signed)
CSN: [id number]     Arrival date & time 12/23/13  1713 History   First MD Initiated Contact with Patient 12/23/13 1758     Chief Complaint  Patient presents with  . Weakness     (Consider location/radiation/quality/duration/timing/severity/associated sxs/prior Treatment) Patient is a 58 y.o. female presenting with weakness. The history is provided by the patient. No language interpreter was used.  Weakness The current episode started in the past 7 days. Associated symptoms include headaches, a visual change and weakness. Pertinent negatives include no abdominal pain, chest pain, chills, fever or nausea.   Pt is a 58 year old female who presents with left sided weakness and tingling. She reports that she had a left sided weakness that started today while she was standing in the kitchen. He reports that this was associated with dizziness and weakness. She also reports that she has a history of migraines and has had a headache today. She reports that she takes Depakote and this morning for her headache but it has not gone away. She reports that she still has a significant headache with photophobia and reports of blurry vision. She denies any nausea, vomiting or unsteady gait.  Past Medical History  Diagnosis Date  . Stroke   . Hyperlipidemia   . COPD (chronic obstructive pulmonary disease)   . Coronary artery disease   . Anxiety   . Depression   . Shortness of breath   . Incontinent of urine   . GERD (gastroesophageal reflux disease)   . Headache(784.0)   . Arthritis    Past Surgical History  Procedure Laterality Date  . Carotid stent    . Cholecystectomy    . Tubal ligation     No family history on file. History  Substance Use Topics  . Smoking status: Current Some Day Smoker -- 40 years    Types: Cigarettes  . Smokeless tobacco: Never Used  . Alcohol Use: No   OB History   Grav Para Term Preterm Abortions TAB SAB Ect Mult Living                 Review of Systems   Constitutional: Negative for fever and chills.  Respiratory: Negative for shortness of breath.   Cardiovascular: Negative for chest pain.  Gastrointestinal: Negative for nausea and abdominal pain.  Genitourinary: Negative for dysuria.  Neurological: Positive for weakness and headaches. Negative for speech difficulty.      Allergies  Review of patient's allergies indicates no known allergies.  Home Medications   Current Outpatient Rx  Name  Route  Sig  Dispense  Refill  . clopidogrel (PLAVIX) 75 MG tablet   Oral   Take 75 mg by mouth daily.         . diazepam (VALIUM) 5 MG tablet   Oral   Take 5 mg by mouth 2 (two) times daily.         . divalproex (DEPAKOTE) 250 MG DR tablet   Oral   Take 3 tablets (750 mg total) by mouth 2 (two) times daily.   180 tablet   1   . montelukast (SINGULAIR) 10 MG tablet   Oral   Take 10 mg by mouth every morning.         Marland Kitchen omeprazole (PRILOSEC) 20 MG capsule   Oral   Take 20 mg by mouth every morning.         . simvastatin (ZOCOR) 40 MG tablet   Oral   Take 40 mg by mouth  at bedtime.         Marland Kitchen tiotropium (SPIRIVA) 18 MCG inhalation capsule   Inhalation   Place 18 mcg into inhaler and inhale daily.         . traZODone (DESYREL) 150 MG tablet   Oral   Take 150 mg by mouth at bedtime.         Marland Kitchen venlafaxine XR (EFFEXOR-XR) 150 MG 24 hr capsule   Oral   Take 150 mg by mouth daily.          BP 120/72  Pulse 76  Temp(Src) 98.2 F (36.8 C) (Oral)  Resp 32  Ht 5\' 6"  (1.676 m)  Wt 180 lb (81.647 kg)  BMI 29.07 kg/m2  SpO2 92% Physical Exam  Nursing note and vitals reviewed. Constitutional: She is oriented to person, place, and time. She appears well-developed and well-nourished. No distress.  HENT:  Head: Normocephalic and atraumatic.  Right Ear: External ear normal.  Left Ear: External ear normal.  Eyes: Conjunctivae and EOM are normal. Pupils are equal, round, and reactive to light.  Neck: Normal range of  motion. Neck supple. No JVD present. No tracheal deviation present. No thyromegaly present.  Cardiovascular: Normal rate, regular rhythm and normal heart sounds.   Pulmonary/Chest: Effort normal and breath sounds normal.  Abdominal: Soft. Bowel sounds are normal. She exhibits no distension. There is no tenderness.  Musculoskeletal: Normal range of motion.  Lymphadenopathy:    She has no cervical adenopathy.  Neurological: She is alert and oriented to person, place, and time. She displays no tremor. No cranial nerve deficit or sensory deficit. She exhibits normal muscle tone. GCS eye subscore is 4. GCS verbal subscore is 5. GCS motor subscore is 6.  Left sided weakness. Grips right > left. Strength or lower extremities right > left.   Skin: Skin is warm and dry.  Psychiatric: She has a normal mood and affect. Her behavior is normal. Judgment and thought content normal.    ED Course  Procedures (including critical care time) Labs Review Labs Reviewed - No data to display Imaging Review No results found.  EKG Interpretation  None  MDM   Final diagnoses:  Migraine  Weakness    History of TIA's and anxiety. Left sided weakness when compared to right, especially noticed more in arms. No leukocytosis or electrolyte abnormality. MRI, no acute infarct. Ischemic changes unchanged. Could be complicated migraine. Pt feeling better after fluids, benadryl and toradol. Ambulatory without any difficulty. Pt feels like she is ok to go home. Return precautions given.       Elisha Headland, NP 12/26/13 1510

## 2013-12-23 NOTE — ED Notes (Signed)
Per ems, pt c/o generlized soreness and weakness. Stroke in 2011, and TIA since then. Hx of anxiety. Call out was initially for stroke, pt passed stroke screen by ems. Pt AAOx4.

## 2013-12-24 NOTE — Discharge Instructions (Signed)
Migraine Headache A migraine headache is an intense, throbbing pain on one or both sides of your head. A migraine can last for 30 minutes to several hours. CAUSES  The exact cause of a migraine headache is not always known. However, a migraine may be caused when nerves in the brain become irritated and release chemicals that cause inflammation. This causes pain. Certain things may also trigger migraines, such as:  Alcohol.  Smoking.  Stress.  Menstruation.  Aged cheeses.  Foods or drinks that contain nitrates, glutamate, aspartame, or tyramine.  Lack of sleep.  Chocolate.  Caffeine.  Hunger.  Physical exertion.  Fatigue.  Medicines used to treat chest pain (nitroglycerine), birth control pills, estrogen, and some blood pressure medicines. SIGNS AND SYMPTOMS  Pain on one or both sides of your head.  Pulsating or throbbing pain.  Severe pain that prevents daily activities.  Pain that is aggravated by any physical activity.  Nausea, vomiting, or both.  Dizziness.  Pain with exposure to bright lights, loud noises, or activity.  General sensitivity to bright lights, loud noises, or smells. Before you get a migraine, you may get warning signs that a migraine is coming (aura). An aura may include:  Seeing flashing lights.  Seeing bright spots, halos, or zig-zag lines.  Having tunnel vision or blurred vision.  Having feelings of numbness or tingling.  Having trouble talking.  Having muscle weakness. DIAGNOSIS  A migraine headache is often diagnosed based on:  Symptoms.  Physical exam.  A CT scan or MRI of your head. These imaging tests cannot diagnose migraines, but they can help rule out other causes of headaches. TREATMENT Medicines may be given for pain and nausea. Medicines can also be given to help prevent recurrent migraines.  HOME CARE INSTRUCTIONS  Only take over-the-counter or prescription medicines for pain or discomfort as directed by your  health care provider. The use of long-term narcotics is not recommended.  Lie down in a dark, quiet room when you have a migraine.  Keep a journal to find out what may trigger your migraine headaches. For example, write down:  What you eat and drink.  How much sleep you get.  Any change to your diet or medicines.  Limit alcohol consumption.  Quit smoking if you smoke.  Get 7 9 hours of sleep, or as recommended by your health care provider.  Limit stress.  Keep lights dim if bright lights bother you and make your migraines worse. SEEK IMMEDIATE MEDICAL CARE IF:   Your migraine becomes severe.  You have a fever.  You have a stiff neck.  You have vision loss.  You have muscular weakness or loss of muscle control.  You start losing your balance or have trouble walking.  You feel faint or pass out.  You have severe symptoms that are different from your first symptoms. MAKE SURE YOU:   Understand these instructions.  Will watch your condition.  Will get help right away if you are not doing well or get worse. Document Released: 10/13/2005 Document Revised: 08/03/2013 Document Reviewed: 06/20/2013 [facility] Patient Information 2014 JA[employer].  Rest Oral hydration Follow-up with PCP Return if symptoms worsen

## 2014-01-03 NOTE — ED Provider Notes (Signed)
Medical screening examination/treatment/procedure(s) were performed by non-physician practitioner and as supervising physician I was immediately available for consultation/collaboration.   EKG Interpretation   Date/Time:  Friday December 23 2013 17:14:29 EST Ventricular Rate:  78 PR Interval:  176 QRS Duration: 86 QT Interval:  382 QTC Calculation: 435 R Axis:   72 Text Interpretation:  Sinus rhythm Low voltage, precordial leads Baseline  wander in lead(s) II ED PHYSICIAN INTERPRETATION AVAILABLE IN [facility] Confirmed by TEST, Record ([id number]) on 12/26/2013 2:27:29 PM        Saddie Benders. Sadeel Fiddler, MD 01/03/14 1345

## 2014-01-20 ENCOUNTER — Telehealth: Payer: Self-pay | Admitting: Neurology

## 2014-01-20 NOTE — Telephone Encounter (Signed)
Pt called.  She states she has been having severe headaches. She has been to the ER twice since her last visit for them. And her medical doctor stated that she needed to schedule an appointment with him as well.  On the last trip to the ER she had had a migraine for 5 days. While there they did a MRI and CT Scan.  She also states that she is experiencing tremors in both arms and is having some left side weakness.  She states that the tremors started a couple of months ago.  Please call to schedule an appointment.  Thank you.

## 2014-01-23 NOTE — Telephone Encounter (Signed)
Called patient to schedule appointment for headaches, lt VM message

## 2014-01-23 NOTE — Telephone Encounter (Signed)
Spoke with patient and scheduled patient for hospital f/u appt.for headaches / confirmed with patient

## 2014-01-23 NOTE — Telephone Encounter (Signed)
Patient returning call.

## 2014-01-30 ENCOUNTER — Ambulatory Visit (INDEPENDENT_AMBULATORY_CARE_PROVIDER_SITE_OTHER): Payer: Medicare Other | Admitting: Nurse Practitioner

## 2014-01-30 ENCOUNTER — Encounter (INDEPENDENT_AMBULATORY_CARE_PROVIDER_SITE_OTHER): Payer: Self-pay

## 2014-01-30 ENCOUNTER — Encounter: Payer: Self-pay | Admitting: Nurse Practitioner

## 2014-01-30 VITALS — BP 128/73 | HR 87 | Ht 66.0 in | Wt 196.0 lb

## 2014-01-30 DIAGNOSIS — I6529 Occlusion and stenosis of unspecified carotid artery: Secondary | ICD-10-CM

## 2014-01-30 DIAGNOSIS — G43009 Migraine without aura, not intractable, without status migrainosus: Secondary | ICD-10-CM

## 2014-01-30 DIAGNOSIS — G459 Transient cerebral ischemic attack, unspecified: Secondary | ICD-10-CM

## 2014-01-30 MED ORDER — PROMETHAZINE HCL 25 MG PO TABS: 25.0000 mg | ORAL_TABLET | Freq: Four times a day (QID) | ORAL | Status: DC | PRN

## 2014-01-30 MED ORDER — DIVALPROEX SODIUM 250 MG PO DR TAB: 500.0000 mg | DELAYED_RELEASE_TABLET | Freq: Two times a day (BID) | ORAL | Status: DC

## 2014-01-30 MED ORDER — TRAMADOL HCL 50 MG PO TABS: 50.0000 mg | ORAL_TABLET | Freq: Four times a day (QID) | ORAL | Status: DC | PRN

## 2014-01-30 NOTE — Patient Instructions (Addendum)
Plan : Continue  Plavix.  Maintain good hydration .   Continue tramadol 50-100 mg as needed for headaches.  Reduce Depakote 500 mg BID  for migraine prophylaxis.  Continue Plavix 75mg  daily. We have advised her to keep a headache diary. Have also asked her continue to participate in stresses accession activities to reduce her headaches.    Follow up in 2 months, sooner as needed.  We will continue to taper Depakote then.

## 2014-01-30 NOTE — Progress Notes (Signed)
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 She has mild persistent weakness and paresthesias in her left hand and gait difficulty. Moderate terminal right vertebrobasilar junction stenosis . She has frequent migraine headaches which continue to be suboptimally controlled.  9/23/1/3 (JM):  She returns for followup after her last visit on 02/16/2012. She states her migraines are much better controlled  and she is tolerating Depakote but can take it only at night. She has only about one headache a month and that responds to tramadol quite well. She has been watching her diet and has lost a few pounds. She is trying to quit smoking and is using electronic cigarettes aand plans to see her primary care doctor to get further help. She remains on Plavix and is tolerating it well without side effects. She has  been having bladder incontinence and needs to wear diapers.  She may require bladder tag surgery but  has not yet seen a urologist. She is having frequent bladder infections.  UPDATE 01/30/14 (LL): She states she has been having severe headaches. She has been to the ER twice since her last visit, which was 1 1/2 years ago.  At each visit to the ER, her Depakote was increased.  On the last trip to the ER she had had a migraine for 5 days. While there they did a MRI and CT Scans which were unremarkable. She also states that she is experiencing tremors in both arms and is having left side weakness, residual from stroke. She states that the tremors started a couple of months ago.   Review of Systems  Out of a complete 14 system review, the patient  complains of only the following symptoms, and all other reviewed systems are negative.  Respiratory: Cough   Snoring   Neurological: Headache   Numbness   Dizziness Tremors  ENT: Ringing in ears, hearing loss, neck pain   Gastrointestinal: Incontinence   Genitourinary:    Incontinence   Psychiatric: confusion, depression, anxiety   ALLERGIES: No Known Allergies  HOME MEDICATIONS: Outpatient Prescriptions Prior to Visit  Medication Sig Dispense Refill  . clopidogrel (PLAVIX) 75 MG tablet Take 75 mg by mouth daily.      . diazepam (VALIUM) 5 MG tablet Take 5 mg by mouth daily.       . fenofibrate (TRICOR) 145 MG tablet Take 145 mg by mouth daily.      . fluticasone (FLONASE) 50 MCG/ACT nasal spray Place 2 sprays into both nostrils daily as needed for allergies or rhinitis.      Marland Kitchen levothyroxine (SYNTHROID, LEVOTHROID) 50 MCG tablet Take 50 mcg by mouth daily before breakfast.      . montelukast (SINGULAIR) 10 MG tablet Take 10 mg by mouth daily as needed (for shortness of breath).       Marland Kitchen omeprazole (PRILOSEC) 20 MG capsule Take 20 mg by mouth every morning.      . simvastatin (ZOCOR) 40 MG tablet Take 40 mg by mouth at bedtime.      Marland Kitchen tiotropium (SPIRIVA) 18 MCG inhalation capsule Place 18 mcg into inhaler and inhale daily as needed.       . traZODone (DESYREL) 150 MG  tablet Take 300 mg by mouth at bedtime.       Marland Kitchen venlafaxine XR (EFFEXOR-XR) 150 MG 24 hr capsule Take 150 mg by mouth daily.      . divalproex (DEPAKOTE) 250 MG DR tablet Take 3 tablets (750 mg total) by mouth 2 (two) times daily.  180 tablet  1  . traMADol (ULTRAM) 50 MG tablet Take 50 mg by mouth every 6 (six) hours as needed (for headaches).       No facility-administered medications prior to visit.   PHYSICAL EXAM  Filed Vitals:   01/30/14 1321  BP: 128/73  Pulse: 87  Height: 5\' 6"  (1.676 m)  Weight: 196 lb (88.905 kg)   Body mass index is 31.65 kg/(m^2).  Physical Exam  General: Patient is well developed and  well groomed. Patient is awake and alert and in no acute distress.  Respiratory: Lungs are clear to auscultation with normal respiratory effort. Cardiovascular: Regular rate and rhythm with no murmurs. No carotid bruit. Musculoskeletal: no deformity Skin: No rash, no bruising  Neurologic Exam  Mental Status: Awake, alert and oriented to person, place and time. Language is fluent and comprehensive. Cranial Nerves: Pupils are equal and round and reactive to light. Conjugate eye movements are full and symmetric. Visual fields are full to confrontation. No evidence of papilledema or other changes on funduscopy. Facial sensation and facial muscle strength are symmetric and in normal limits. Hearing is intact. Palate elevated symmetrically and uvula is midline. Shoulder shrug is symmetric. Tongue is midline. Motor: Symmetric normal motor tone is noted throughout. Normal muscle bulk. Testing reveals 5/5  muscle strength except 4/5 left shoulder strength. diminished fine finger movements on the left.  Miild left hand weakness.Orbits right over left upper extremity. Sensory: Intact and symmetric to light touch. Coordination: Cerebellar testing reveals good finger-to-nose and heel-to-shin bilaterally. Mild tremor noted. Gait and Station: Arises from chair without difficulty.  Narrow based gait with normal arm swing bilateral, BUT SLIGHT DRAGGING OF LEFT LEG able to walk on heels and toes.  Tandem walk is stable. No pronator drift   Reflexes: DTR in the upper and lower extremity are present and symmetric 2+.   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 She has mild persistent weakness and paresthesias in her left hand and gait difficulty yet. Moderate terminal right  vertebrobasilar junction stenosis. She has migraine headaches which were previously well controlled on Depakote, now worse in the last 3 months.  Recent MRI shows old infarcts but nothing acute.  She is reporting more tremor in the last few months as well.   Plan: Maintain good hydration.  Continue trial of tramadol 50-100 mg as needed for headaches and   Decrease Depakote to 500 mg BID  for migraine prophylaxis. Phenergan 25 mg q 6 hours as needed for nausea from headache.  Continue Plavix 75mg  daily. We have advised her to keep a headache diary. Have also asked her continue to participate in stresses accession activities to reduce her headaches.   Follow up in 2 months, sooner as needed.  We will continue to taper Depakote then.  Meds ordered this encounter  Medications  . divalproex (DEPAKOTE) 250 MG DR tablet    Sig: Take 2 tablets (500 mg total) by mouth 2 (two) times daily.  Dispense:  180 tablet    Refill:  3    Order Specific Question:  Supervising Provider    Answer:  Leonie Man, PRAMOD S [[id number]]  . promethazine (PHENERGAN) 25 MG tablet    Sig: Take 1 tablet (25 mg total) by mouth every 6 (six) hours as needed for nausea or vomiting.    Dispense:  30 tablet    Refill:  0    Order Specific Question:  Supervising Provider    Answer:  Margette Fast K [[id number]]  . traMADol (ULTRAM) 50 MG tablet    Sig: Take 1 tablet (50 mg total) by mouth every 6 (six) hours as needed (for headaches).    Dispense:  60 tablet    Refill:  5    Order Specific Question:  Supervising Provider    Answer:  Garvin Fila [[id number]]   Return in about 2 months (around 04/01/2014).  Philmore Pali, MSN, NP-C 01/30/2014, 4:13 PM [facility] [address] [place], [place] [address] [phone]  Note: This document was prepared with digital dictation and possible smart phrase technology. Any transcriptional errors that result from this process are unintentional.

## 2014-03-08 ENCOUNTER — Other Ambulatory Visit: Payer: Self-pay

## 2014-03-08 MED ORDER — PROMETHAZINE HCL 25 MG PO TABS: 25.0000 mg | ORAL_TABLET | Freq: Four times a day (QID) | ORAL | Status: DC | PRN

## 2014-04-04 ENCOUNTER — Encounter (INDEPENDENT_AMBULATORY_CARE_PROVIDER_SITE_OTHER): Payer: Self-pay

## 2014-04-04 ENCOUNTER — Ambulatory Visit (INDEPENDENT_AMBULATORY_CARE_PROVIDER_SITE_OTHER): Payer: Medicare Other | Admitting: Nurse Practitioner

## 2014-04-04 ENCOUNTER — Encounter: Payer: Self-pay | Admitting: Nurse Practitioner

## 2014-04-04 VITALS — BP 133/82 | HR 87 | Ht 65.0 in | Wt 202.0 lb

## 2014-04-04 DIAGNOSIS — I6529 Occlusion and stenosis of unspecified carotid artery: Secondary | ICD-10-CM

## 2014-04-04 DIAGNOSIS — G43009 Migraine without aura, not intractable, without status migrainosus: Secondary | ICD-10-CM

## 2014-04-04 MED ORDER — TRAMADOL HCL 50 MG PO TABS: 50.0000 mg | ORAL_TABLET | Freq: Four times a day (QID) | ORAL | Status: DC | PRN

## 2014-04-04 MED ORDER — DIVALPROEX SODIUM 250 MG PO DR TAB: 250.0000 mg | DELAYED_RELEASE_TABLET | Freq: Two times a day (BID) | ORAL | Status: DC

## 2014-04-04 NOTE — Patient Instructions (Signed)
Reduce Depakote to 1 tablet 250 mg twice a day.  New Rx sent to pharmacy.  Continue Tramadol 1-2 tablets as needed for headaches. New Rx given.  Follow up in 6 months, sooner as needed.  Call for refills on Tramadol if needed.

## 2014-04-04 NOTE — Progress Notes (Signed)
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 She has mild persistent weakness and paresthesias in her left hand and gait difficulty. Moderate terminal right vertebrobasilar junction stenosis . She has frequent migraine headaches which continue to be suboptimally controlled.  9/23/1/3 (JM): She returns for followup after her last visit on 02/16/2012. She states her migraines are much better controlled and she is tolerating Depakote but can take it only at night. She has only about one headache a month and that responds to tramadol quite well. She has been watching her diet and has lost a few pounds. She is trying to quit smoking and is using electronic cigarettes and plans to see her primary care doctor to get further help. She remains on Plavix and is tolerating it well without side effects. She has been having bladder incontinence and needs to wear diapers. She may require bladder tag surgery but has not yet seen a urologist. She is having frequent bladder infections.  UPDATE 01/30/14 (LL): She states she has been having severe headaches. She has been to the ER twice since her last visit, which was 1 1/2 years ago. At each visit to the ER, her Depakote was increased. On the last trip to the ER she had had a migraine for 5 days. While there they did a MRI and CT Scans which were unremarkable. She also states that she is experiencing tremors in both arms and is having left side weakness, residual from stroke. She states that the tremors started a couple of months ago.   UPDATE 04/04/14 (LL):  Since last visit she has had only 1-2 severe  headaches.  They were relieved with Tramadol.  Hand tremor is less noticeable.  Now taking Depakote 500 mg bid.  Seeing Psych for depression regularly.  She is tolerating Plavix well with no signs of significant bleeding or bruising.  She is still smoking, has quit before and started back.  States she is working on it.  Review of Systems  Out of a complete 14 system review, the patient complains of only the following symptoms, and all other reviewed systems are negative.  Headache, Numbness, Dizziness, Tremors, hearing loss, neck pain, Constipation, Incontinence, depression, anxiety   ALLERGIES: No Known Allergies  HOME MEDICATIONS: Outpatient Prescriptions Prior to Visit  Medication Sig Dispense Refill  . clopidogrel (PLAVIX) 75 MG tablet Take 75 mg by mouth daily.      . diazepam (VALIUM) 5 MG tablet Take 5 mg by mouth daily.       . fenofibrate (TRICOR) 145 MG tablet Take 145 mg by mouth daily.      . fluticasone (FLONASE) 50 MCG/ACT nasal spray Place 2 sprays into both nostrils daily as needed for allergies or rhinitis.      Marland Kitchen levothyroxine (SYNTHROID, LEVOTHROID) 50 MCG tablet Take 50 mcg by mouth daily before breakfast.      . montelukast (SINGULAIR) 10 MG tablet Take 10 mg by mouth daily as needed (for shortness of breath).       Marland Kitchen omeprazole (PRILOSEC) 20 MG capsule Take 20 mg by mouth every morning.      . promethazine (PHENERGAN) 25 MG tablet Take 1 tablet (25  mg total) by mouth every 6 (six) hours as needed for nausea or vomiting.  30 tablet  1  . simvastatin (ZOCOR) 40 MG tablet Take 40 mg by mouth at bedtime.      Marland Kitchen tiotropium (SPIRIVA) 18 MCG inhalation capsule Place 18 mcg into inhaler and inhale daily as needed.       . traZODone (DESYREL) 150 MG tablet Take 300 mg by mouth at bedtime.       Marland Kitchen venlafaxine XR (EFFEXOR-XR) 150 MG 24 hr capsule Take 150 mg by mouth daily.      . divalproex (DEPAKOTE) 250 MG DR tablet Take 2 tablets (500 mg total) by mouth 2 (two) times daily.   180 tablet  3  . traMADol (ULTRAM) 50 MG tablet Take 1 tablet (50 mg total) by mouth every 6 (six) hours as needed (for headaches).  60 tablet  5   No facility-administered medications prior to visit.   PHYSICAL EXAM  Filed Vitals:   04/04/14 1114  BP: 133/82  Pulse: 87  Height: 5\' 5"  (1.651 m)  Weight: 202 lb (91.627 kg)   Body mass index is 33.61 kg/(m^2). No exam data present   Physical Exam  General: Patient is well developed and well groomed. Patient is awake and alert and in no acute distress.  Respiratory: Lungs are clear to auscultation with normal respiratory effort.  Cardiovascular: Regular rate and rhythm with no murmurs. No carotid bruit.  Musculoskeletal: no deformity  Skin: No rash, no bruising   Neurologic Exam  Mental Status: Awake, alert and oriented to person, place and time. Language is fluent and comprehensive.  Cranial Nerves: Pupils are equal and round and reactive to light. Conjugate eye movements are full and symmetric. Visual fields are full to confrontation. No evidence of papilledema or other changes on funduscopy. Facial sensation and facial muscle strength are symmetric and in normal limits. Hearing is intact. Palate elevated symmetrically and uvula is midline. Shoulder shrug is symmetric. Tongue is midline.  Motor: Symmetric normal motor tone is noted throughout. Normal muscle bulk. Testing reveals 5/5 muscle strength except 4/5 left shoulder strength. Diminished fine finger movements on the left. Mild left hand weakness.  Orbits right over left upper extremity.  Sensory: Intact and symmetric to light touch.  Coordination: Cerebellar testing reveals good finger-to-nose and heel-to-shin bilaterally. Mild tremor noted, R>L Gait and Station: Arises from chair without difficulty. Narrow based gait with normal arm swing bilateral, BUT SLIGHT DRAGGING OF LEFT LEG able to walk on heels and toes. Tandem walk is unsteady. No pronator drift  Reflexes: DTR in the  upper and lower extremity are present and symmetric 2+.    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 She has mild persistent weakness and paresthesias in her left hand and gait difficulty yet. Moderate terminal right vertebrobasilar junction stenosis. She has migraine headaches which were previously well controlled on Depakote, 250 mg bid.  Recent MRI shows old infarcts but nothing acute. She is reporting more tremor in the last few months as well.   Plan:  Maintain good hydration. Continue tramadol 50-100 mg as needed for headaches and  Decrease Depakote to 250 mg BID for migraine prophylaxis.  Phenergan 25 mg, 1/2 tablet q 6 hours as needed for nausea from headache.  Continue Plavix 75mg   daily. We have advised her to keep a headache diary. Have also asked her continue to participate in stresses accession activities to reduce her headaches.  Follow up in 6 months, sooner as needed.   Meds ordered this encounter  Medications  . divalproex (DEPAKOTE) 250 MG DR tablet    Sig: Take 1 tablet (250 mg total) by mouth 2 (two) times daily.    Dispense:  180 tablet    Refill:  3    Order Specific Question:  Supervising Provider    Answer:  Leonie Man, PRAMOD S [[id number]]  . traMADol (ULTRAM) 50 MG tablet    Sig: Take 1-2 tablets (50-100 mg total) by mouth every 6 (six) hours as needed (for headaches).    Dispense:  90 tablet    Refill:  0    Order Specific Question:  Supervising Provider    Answer:  Garvin Fila [[id number]]   Return in about 6 months (around 10/04/2014).  Philmore Pali, MSN, NP-C 04/04/2014, 12:21 PM [facility] [address], [place], [place] [address] [phone]  Note: This document was prepared with digital dictation  and possible smart phrase technology. Any transcriptional errors that result from this process are unintentional.

## 2014-04-04 NOTE — Progress Notes (Signed)
I agree with above 

## 2014-04-06 ENCOUNTER — Telehealth: Payer: Self-pay | Admitting: Nurse Practitioner

## 2014-04-06 NOTE — Telephone Encounter (Signed)
April [name] with [facility] @ [phone] needing verification that Rx for divalproex (DEPAKOTE) 250 MG DR tablet had been changed to 1 tab 2x's a day.  Previous dosage was 2 tabs twice daily.  Please call and advise

## 2014-04-07 NOTE — Telephone Encounter (Signed)
I tried to call April [name] back with [facility] and the operator is saying the call can not be completed at this time.

## 2014-04-10 NOTE — Telephone Encounter (Signed)
I have been out of the office since 06/06 Last OV note says: Decrease Depakote to 250 mg BID for migraine prophylaxis I called back.  Spoke with Taquita (? On spelling).  She will note the Rx we sent is the current dose.

## 2014-04-10 NOTE — Telephone Encounter (Signed)
[facility] calling to follow up on the status of this message, please return call to [phone].

## 2014-04-10 NOTE — Progress Notes (Signed)
I agree with above 

## 2014-04-10 NOTE — Telephone Encounter (Signed)
Pharmacy needing Rx verification. Please advise

## 2014-05-07 ENCOUNTER — Telehealth: Payer: Self-pay

## 2014-05-07 NOTE — Telephone Encounter (Signed)
[employer] sent [place] a letter saying they have approved our request for coverage on Promethazine effective until 10/26/2014 Ref Member # [id number]

## 2014-06-06 ENCOUNTER — Telehealth: Payer: Self-pay | Admitting: Neurology

## 2014-06-06 ENCOUNTER — Encounter: Payer: Self-pay | Admitting: Neurology

## 2014-06-06 NOTE — Telephone Encounter (Signed)
Left message for patient regarding rescheduling 10/04/14 appointment per Dr. Clydene Fake schedule, mailed letter with new appointment time.

## 2014-06-08 ENCOUNTER — Telehealth: Payer: Self-pay | Admitting: Nurse Practitioner

## 2014-06-08 NOTE — Telephone Encounter (Signed)
Last OV note says: Reduce Depakote to 1 tablet 250 mg twice a day. New Rx sent to pharmacy for 1 year  I called the patient back got no answer.  Left message.

## 2014-06-08 NOTE — Telephone Encounter (Signed)
Patient is questioning if she should take a lesser dose of divalproex (DEPAKOTE) 250 MG DR tablet, since she's being weaned.  Please return call anytime and can leave detailed message on vm if not available.  Thanks

## 2014-07-17 ENCOUNTER — Telehealth: Payer: Self-pay | Admitting: Nurse Practitioner

## 2014-07-17 ENCOUNTER — Other Ambulatory Visit: Payer: Self-pay | Admitting: Neurology

## 2014-07-17 DIAGNOSIS — G43009 Migraine without aura, not intractable, without status migrainosus: Secondary | ICD-10-CM

## 2014-07-17 MED ORDER — PROMETHAZINE HCL 25 MG PO TABS: 25.0000 mg | ORAL_TABLET | Freq: Four times a day (QID) | ORAL | Status: DC | PRN

## 2014-07-17 NOTE — Telephone Encounter (Signed)
Pt's Rx was sent to the pharmacy, confirmation received.

## 2014-07-17 NOTE — Telephone Encounter (Signed)
Patient requesting Rx refill for promethazine (PHENERGAN) 25 MG tablet sent to [facility] in [place].  Please call anytime and may leave detailed message on vm.

## 2014-07-18 ENCOUNTER — Other Ambulatory Visit: Payer: Self-pay | Admitting: Neurology

## 2014-07-18 ENCOUNTER — Telehealth: Payer: Self-pay | Admitting: Nurse Practitioner

## 2014-07-18 DIAGNOSIS — G43009 Migraine without aura, not intractable, without status migrainosus: Secondary | ICD-10-CM

## 2014-07-18 MED ORDER — PROMETHAZINE HCL 25 MG PO TABS: 25.0000 mg | ORAL_TABLET | Freq: Four times a day (QID) | ORAL | Status: DC | PRN

## 2014-07-18 NOTE — Telephone Encounter (Signed)
Called pt to inform her that her Rx was sent to [facility] in [place] and I advised the pt that if there is any other problems, questions or concerns to call the office. Pt verbalized understanding.

## 2014-07-18 NOTE — Telephone Encounter (Signed)
Patient needs Rx for promethazine (PHENERGAN) 25 MG tablet sent to [facility] in [place].

## 2014-07-28 ENCOUNTER — Telehealth: Payer: Self-pay

## 2014-07-28 NOTE — Telephone Encounter (Signed)
[employer] notified us they have approved our request for coverage on Promethazine effective until 10/27/2015

## 2014-10-04 ENCOUNTER — Ambulatory Visit: Payer: Medicare Other | Admitting: Neurology

## 2014-11-02 ENCOUNTER — Other Ambulatory Visit: Payer: Self-pay

## 2014-11-02 MED ORDER — TRAMADOL HCL 50 MG PO TABS: 50.0000 mg | ORAL_TABLET | Freq: Four times a day (QID) | ORAL | Status: DC | PRN

## 2014-11-02 NOTE — Telephone Encounter (Signed)
Rx signed and faxed.

## 2014-11-16 ENCOUNTER — Other Ambulatory Visit: Payer: Self-pay

## 2014-11-16 DIAGNOSIS — G43009 Migraine without aura, not intractable, without status migrainosus: Secondary | ICD-10-CM

## 2014-11-16 MED ORDER — PROMETHAZINE HCL 25 MG PO TABS
25.0000 mg | ORAL_TABLET | Freq: Four times a day (QID) | ORAL | Status: DC | PRN
Start: 2014-11-16 — End: 2015-07-11

## 2014-11-16 MED ORDER — TRAMADOL HCL 50 MG PO TABS: 50.0000 mg | ORAL_TABLET | Freq: Four times a day (QID) | ORAL | Status: DC | PRN

## 2015-02-08 ENCOUNTER — Encounter: Payer: Self-pay | Admitting: Neurology

## 2015-02-08 ENCOUNTER — Ambulatory Visit (INDEPENDENT_AMBULATORY_CARE_PROVIDER_SITE_OTHER): Payer: Medicare HMO | Admitting: Neurology

## 2015-02-08 VITALS — BP 137/85 | HR 77 | Wt 222.0 lb

## 2015-02-08 DIAGNOSIS — G5621 Lesion of ulnar nerve, right upper limb: Secondary | ICD-10-CM

## 2015-02-08 DIAGNOSIS — G450 Vertebro-basilar artery syndrome: Secondary | ICD-10-CM | POA: Diagnosis not present

## 2015-02-08 NOTE — Patient Instructions (Signed)
I had a long discussion with the patient with regards to her 2 episodes of sudden leg jerking and brief loss of consciousness may possibly represent limb shaking TIAs given her history of significant occlusive cerebrovascular disease. Check CT angiogram of the brain and neck and if there is significant stent restenosis may need repeat angioplasty. Continue Plavix for stroke prevention with strict control of hypertension with blood pressure goal below 130/90 and lipids with LDL cholesterol goal below 100 mg percent. I have counseled the patient again to quit smoking. 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 She will return for follow-up in 3 months or call earlier if necessary.

## 2015-02-08 NOTE — Progress Notes (Signed)
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 She has mild persistent weakness and paresthesias in her left hand and gait difficulty. Moderate terminal right vertebrobasilar junction stenosis . She has frequent migraine headaches which continue to be suboptimally controlled.  9/23/1/3 (JM): She returns for followup after her last visit on 02/16/2012. She states her migraines are much better controlled and she is tolerating Depakote but can take it only at night. She has only about one headache a month and that responds to tramadol quite well. She has been watching her diet and has lost a few pounds. She is trying to quit smoking and is using electronic cigarettes and plans to see her primary care doctor to get further help. She remains on Plavix and is tolerating it well without side effects. She has been having bladder incontinence and needs to wear diapers. She may require bladder tag surgery but has not yet seen a urologist. She is having frequent bladder infections.  UPDATE 01/30/14 (LL): She states she has been having severe headaches. She has been to the ER twice since her last visit, which was 1 1/2 years ago. At each visit to the ER, her Depakote was increased. On the last trip to the ER she had had a migraine for 5 days. While there they did a MRI and CT Scans which were unremarkable. She also states that she is experiencing tremors in both arms and is having left side weakness, residual from stroke. She states that the tremors started a couple of months ago.   UPDATE 04/04/14 (LL):  Since last visit she has had only 1-2 severe  headaches.  They were relieved with Tramadol.  Hand tremor is less noticeable.  Now taking Depakote 500 mg bid.  Seeing Psych for depression regularly.  She is tolerating Plavix well with no signs of significant bleeding or bruising.  She is still smoking, has quit before and started back.  States she is working on it. Update 02/08/15 : She returns for follow-up after last visit 10 months ago. She reports 2 separate episodes of sudden onset of transient leg shaking for a few minutes one in February and another one on March 2 when she actually passed out for a few moments. She did not seek medical help. She denies any headache at that time blurred vision loss of vision extremity weakness and numbness. She also complains of numbness in the medial aspect of the right hand involving 2 fingers. She does have a tendency to sleep on her right side. She denies any neck pain, radicular pain, head injury, and can't accident and known history of degenerative cervical spine disease. She states the headaches are much better and she has tapered and discontinued the Depakote without any recurrence of her headaches. She is not had any cerebrovascular imaging in last couple of years but does have a history of bilateral carotid occlusion and left vertebral origin stenosis treated with the stent by Dr. Estanislado Pandy a few years ago. She has not had any recent follow-up with him. Review of Systems  Out of a complete 14 system review, the patient complains of only the following symptoms, and all other reviewed systems are negative.  Headache, Numbness, Dizziness, Tremors, hearing loss, insomnia, aching muscles, frequency of urination, incontinence, passing out, anxiety, depression, cough and light sensitivity   ALLERGIES: No Known Allergies  HOME MEDICATIONS: Outpatient Prescriptions Prior to Visit  Medication Sig Dispense Refill  . clopidogrel (PLAVIX) 75 MG tablet Take 75 mg by mouth daily.    . fenofibrate (TRICOR) 145 MG  tablet Take 145 mg by mouth daily.    . fluticasone (FLONASE) 50 MCG/ACT nasal spray Place 2 sprays into both nostrils daily as needed for allergies or rhinitis.    Marland Kitchen montelukast (SINGULAIR) 10 MG tablet Take 10 mg by mouth daily as needed (for shortness of breath).     . promethazine (PHENERGAN) 25 MG tablet Take 1 tablet (25 mg total) by mouth every 6 (six) hours as needed for nausea or vomiting. 90 tablet 1  . simvastatin (ZOCOR) 40 MG tablet Take 40 mg by mouth at bedtime.    Marland Kitchen tiotropium (SPIRIVA) 18 MCG inhalation capsule Place 18 mcg into inhaler and inhale daily as needed.     . traMADol (ULTRAM) 50 MG tablet Take 1-2 tablets (50-100 mg total) by mouth every 6 (six) hours as needed (for headaches). 90 tablet 1  . traZODone (DESYREL) 150 MG tablet Take 300 mg by mouth at bedtime.     Marland Kitchen venlafaxine XR (EFFEXOR-XR) 150 MG 24 hr capsule Take 150 mg by mouth daily.    . divalproex (DEPAKOTE) 250 MG DR tablet Take 1 tablet (250 mg total) by mouth 2 (two) times daily. (Patient not taking: Reported on 02/08/2015) 180 tablet 3  . levothyroxine (SYNTHROID, LEVOTHROID) 50 MCG tablet Take 50 mcg by mouth daily before breakfast.    . diazepam (VALIUM) 5 MG tablet Take 5 mg by mouth daily.     Marland Kitchen omeprazole (PRILOSEC) 20 MG capsule Take 20 mg by mouth every morning.     No facility-administered medications prior to visit.   PHYSICAL EXAM  Filed Vitals:   02/08/15 1623  BP: 137/85  Pulse: 77  Weight: 222 lb (100.699 kg)   Body mass index is 36.94 kg/(m^2). No exam data present   Physical Exam  General: Patient is well developed and well groomed. Patient is awake and alert and in no acute distress.  Respiratory: Lungs are clear to auscultation with normal respiratory effort.  Cardiovascular: Regular rate and rhythm with no murmurs. No carotid bruit.  Musculoskeletal: no deformity  Skin: No rash, no bruising . Old surgical scar right wrist from carpal tunnel surgery  Neurologic Exam  Mental  Status: Awake, alert and oriented to person, place and time. Language is fluent and comprehensive.  Cranial Nerves: Pupils are equal and round and reactive to light. Conjugate eye movements are full and symmetric. Visual fields are full to confrontation.  . Facial sensation and facial muscle strength are symmetric and in normal limits. Hearing is intact. Palate elevated symmetrically and uvula is midline. Shoulder shrug is symmetric. Tongue is midline.  Motor: Symmetric normal motor tone is noted throughout. Normal muscle bulk. Testing reveals 5/5 muscle strength except 4/5 left shoulder strength. Diminished fine finger movements on the left. Mild left hand weakness.  Orbits right over left upper extremity.  Sensory: Intact and symmetric to light touch. Positive Tinel's sign over the right elbow Coordination: Cerebellar testing reveals good finger-to-nose and heel-to-shin bilaterally. Mild tremor noted, R>L Gait and Station: Arises from chair without difficulty. Narrow based gait with normal arm swing bilateral, drags left leg slightly.able to walk on heels and  toes. Tandem walk is unsteady. No pronator drift  Reflexes: DTR in the upper and lower extremity are present and symmetric 2+.    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 She has mild persistent weakness and paresthesias in her left hand and gait difficulty yet. Moderate terminal right vertebrobasilar junction stenosis. New complaints of transient slurred and legs shaking with once brief loss of consciousness worrisome for limb shaking TIAs given her history of significant occlusive cerebrovascular disease. 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 Check CT angiogram of the brain and neck and if there is significant stent restenosis may need repeat angioplasty. Continue Plavix for stroke prevention with strict control of hypertension with blood pressure goal below 130/90 and lipids with LDL cholesterol goal below 100 mg percent. I have counseled the patient again to quit smoking. 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 She will return for follow-up in 3 months or call earlier if necessary. Antony Contras, MD        .            .       .           .       .       .         Return in about 3 months (around 05/10/2015).  Antony Contras, MD  02/08/2015, 4:53 PM [facility] [address] [place], [place] [address] [phone]  Note: This document was prepared with digital dictation and possible smart phrase technology. Any transcriptional errors that result from this process are unintentional.

## 2015-02-15 ENCOUNTER — Encounter: Payer: Medicare HMO | Admitting: Radiology

## 2015-02-15 ENCOUNTER — Encounter: Payer: Medicare HMO | Admitting: Diagnostic Neuroimaging

## 2015-02-27 ENCOUNTER — Ambulatory Visit
Admission: RE | Admit: 2015-02-27 | Discharge: 2015-02-27 | Disposition: A | Discharge status: Home / Self Care | Payer: Medicare HMO | Source: Ambulatory Visit | Attending: Neurology | Admitting: Neurology

## 2015-02-27 DIAGNOSIS — G450 Vertebro-basilar artery syndrome: Secondary | ICD-10-CM

## 2015-02-27 MED ORDER — IOPAMIDOL (ISOVUE-370) INJECTION 76%
75.0000 mL | Freq: Once | INTRAVENOUS | Status: AC | PRN
  Administered 2015-02-27: 75 mL via INTRAVENOUS

## 2015-04-09 ENCOUNTER — Other Ambulatory Visit: Payer: Self-pay | Admitting: Physician Assistant

## 2015-05-16 ENCOUNTER — Ambulatory Visit: Payer: Medicare HMO | Admitting: Neurology

## 2015-05-31 ENCOUNTER — Encounter: Payer: Medicare HMO | Admitting: Neurology

## 2015-07-05 ENCOUNTER — Ambulatory Visit (INDEPENDENT_AMBULATORY_CARE_PROVIDER_SITE_OTHER): Payer: PPO | Admitting: Neurology

## 2015-07-05 ENCOUNTER — Encounter (INDEPENDENT_AMBULATORY_CARE_PROVIDER_SITE_OTHER): Payer: Self-pay | Admitting: Neurology

## 2015-07-05 DIAGNOSIS — G5621 Lesion of ulnar nerve, right upper limb: Secondary | ICD-10-CM | POA: Diagnosis not present

## 2015-07-05 DIAGNOSIS — Z0289 Encounter for other administrative examinations: Secondary | ICD-10-CM

## 2015-07-06 NOTE — Procedures (Signed)
   NCS (NERVE CONDUCTION STUDY) WITH EMG (ELECTROMYOGRAPHY) REPORT   STUDY DATE: September 8th 2016 PATIENT NAME: April [name] DOB: 04-17-1956 MRN: [id number]    TECHNOLOGIST: Laretta Alstrom ELECTROMYOGRAPHER: Marcial Pacas M.D.  CLINICAL INFORMATION:  59 year old right-handed female with history of stroke, presenting with right hand paresthesia, mainly involving right fourth, and fifth fingers.  FINDINGS: NERVE CONDUCTION STUDY: Bilateral ulnar sensory and motor responses were normal. Bilateral median ulnar and sensory motor responses were normal.  NEEDLE ELECTROMYOGRAPHY: Selective needle examinations was performed at right upper extremity muscles, right cervical paraspinal muscles.  Needle examination of right flexor carpi ulnaris, abductor digital minimi, right pronator teres, biceps, triceps, deltoid, extensor digitorum communis was normal.  There was no spontaneous activity at right cervical paraspinal muscles, right C5, 6, 7.  IMPRESSION: This is a normal study. There is no electrodiagnostic evidence of right cervical radiculopathy, or right ulnar neuropathy.   INTERPRETING PHYSICIAN:   Marcial Pacas M.D. Ph.D. [facility] [address], [place], [place] [address] [phone]

## 2015-07-10 ENCOUNTER — Telehealth: Payer: Self-pay

## 2015-07-10 NOTE — Telephone Encounter (Signed)
I spoke to patient and she is aware of results and will come to appt tomorrow.

## 2015-07-10 NOTE — Telephone Encounter (Signed)
-----   Message from Garvin Fila, MD sent at 07/10/2015 12:44 PM EDT ----- April [name] inform the patient that nerve conduction study was normal

## 2015-07-11 ENCOUNTER — Encounter: Payer: Self-pay | Admitting: Neurology

## 2015-07-11 ENCOUNTER — Ambulatory Visit (INDEPENDENT_AMBULATORY_CARE_PROVIDER_SITE_OTHER): Payer: PPO | Admitting: Neurology

## 2015-07-11 VITALS — BP 134/77 | HR 82 | Ht 63.0 in | Wt 235.6 lb

## 2015-07-11 DIAGNOSIS — G43009 Migraine without aura, not intractable, without status migrainosus: Secondary | ICD-10-CM

## 2015-07-11 DIAGNOSIS — R202 Paresthesia of skin: Secondary | ICD-10-CM | POA: Diagnosis not present

## 2015-07-11 MED ORDER — PROMETHAZINE HCL 25 MG PO TABS: 25.0000 mg | ORAL_TABLET | Freq: Four times a day (QID) | ORAL | Status: DC | PRN

## 2015-07-11 NOTE — Progress Notes (Signed)
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 She has mild persistent weakness and paresthesias in her left hand and gait difficulty. Moderate terminal right vertebrobasilar junction stenosis . She has frequent migraine headaches which continue to be suboptimally controlled.  9/23/1/3 (JM): She returns for followup after her last visit on 02/16/2012. She states her migraines are much better controlled and she is tolerating Depakote but can take it only at night. She has only about one headache a month and that responds to tramadol quite well. She has been watching her diet and has lost a few pounds. She is trying to quit smoking and is using electronic cigarettes and plans to see her primary care doctor to get further help. She remains on Plavix and is tolerating it well without side effects. She has been having bladder incontinence and needs to wear diapers. She may require bladder tag surgery but has not yet seen a urologist. She is having frequent bladder infections.  UPDATE 01/30/14 (LL): She states she has been having severe headaches. She has been to the ER twice since her last visit, which was 1 1/2 years ago. At each visit to the ER, her Depakote was increased. On the last trip to the ER she had had a migraine for 5 days. While there they did a MRI and CT Scans which were unremarkable. She also states that she is experiencing tremors in both arms and is having left side weakness, residual from stroke. She states that the tremors started a couple of months ago.   UPDATE 04/04/14 (LL):  Since last visit she has had only 1-2 severe  headaches.  They were relieved with Tramadol.  Hand tremor is less noticeable.  Now taking Depakote 500 mg bid.  Seeing Psych for depression regularly.  She is tolerating Plavix well with no signs of significant bleeding or bruising.  She is still smoking, has quit before and started back.  States she is working on it. Update 02/08/15 : She returns for follow-up after last visit 10 months ago. She reports 2 separate episodes of sudden onset of transient leg shaking for a few minutes one in February and another one on March 2 when she actually passed out for a few moments. She did not seek medical help. She denies any headache at that time blurred vision loss of vision extremity weakness and numbness. She also complains of numbness in the medial aspect of the right hand involving 2 fingers. She does have a tendency to sleep on her right side. She denies any neck pain, radicular pain, head injury, and can't accident and known history of degenerative cervical spine disease. She states the headaches are much better and she has tapered and discontinued the Depakote without any recurrence of her headaches. She is not had any cerebrovascular imaging in last couple of years but does have a history of bilateral carotid occlusion and left vertebral origin stenosis treated with the stent by Dr. Estanislado Pandy a few years ago. She has not had any recent follow-up with him. Update 07/11/2015 : She returns for f/u after last visit 5 months ago. She is stable from neurovascular standpoint with out symptoms. She had EMG/NCV on  07/05/15 which was normal and CTAngiogram neck and brain on 02/27/15 which I have both reviewed personally and show patent vertebral origin stent with known chronic bilateral ICA occlusions. And moderate right V 4 segment vertebral stenosis.She has 3-4 migraines per month which respond well to migraine excedrin.these are often trigerred by stress.She was on depakote for headache prophylaxis but stopped it due to side  effects.She compalins of chronic neck and back muscular pain and has seeen chripractor for these. Review of Systems  Out of a complete 14 system review, the patient complains of only the following symptoms, and all other reviewed systems are negative.  Headache, Numbness, Dizziness,  , hearing loss,depression,anxiety,incontinence of bladder,aching muscles,back pain,joint pain,restless leg,insomnia,heat intolerance,light sensitivity,leg swelling,cough,wheezing,weight change insomnia, aching muscles, frequency of urination, incontinence, passing out, anxiety, depression, cough and light sensitivity   ALLERGIES: No Known Allergies  HOME MEDICATIONS: Outpatient Prescriptions Prior to Visit  Medication Sig Dispense Refill  . aspirin-acetaminophen-caffeine (EXCEDRIN MIGRAINE) 250-250-65 MG per tablet Take 1 tablet by mouth every 6 (six) hours as needed.     . clopidogrel (PLAVIX) 75 MG tablet Take 75 mg by mouth daily.    . fenofibrate (TRICOR) 145 MG tablet Take 145 mg by mouth daily.    . fluticasone (FLONASE) 50 MCG/ACT nasal spray Place 2 sprays into both nostrils daily as needed for allergies or rhinitis.    . furosemide (LASIX) 20 MG tablet Take 20 mg by mouth daily as needed.     . meloxicam (MOBIC) 15 MG tablet Take by mouth daily as needed.   2  . Multiple Vitamin (MULTIVITAMINS PO)     . pantoprazole (PROTONIX) 40 MG tablet Take 40 mg by mouth daily.     . simvastatin (ZOCOR) 40 MG tablet Take 40 mg by mouth at bedtime.    Marland Kitchen tiotropium (SPIRIVA) 18 MCG inhalation capsule Place 18 mcg into inhaler and inhale daily as needed.     . traMADol (ULTRAM) 50 MG tablet Take 1-2 tablets (50-100 mg total) by mouth every 6 (six) hours as needed (for headaches). 90 tablet 1  . traZODone (DESYREL) 150 MG tablet Take 300 mg by mouth at bedtime.     Marland Kitchen venlafaxine XR (EFFEXOR-XR) 150 MG 24 hr capsule Take 150 mg by mouth daily.    . promethazine (PHENERGAN) 25 MG tablet Take 1 tablet (25 mg total) by  mouth every 6 (six) hours as needed for nausea or vomiting. 90 tablet 1  . diazepam (VALIUM) 2 MG tablet Take 1 mg by mouth daily.   3  . divalproex (DEPAKOTE) 250 MG DR tablet Take 1 tablet (250 mg total) by mouth 2 (two) times daily. (Patient not taking: Reported on 07/11/2015) 180 tablet 3  . levothyroxine (SYNTHROID, LEVOTHROID) 50 MCG tablet Take 50 mcg by mouth daily before breakfast.    . montelukast (SINGULAIR) 10 MG tablet Take 10 mg by mouth daily as needed (for shortness of breath).      No facility-administered medications prior to visit.   PHYSICAL EXAM  Filed Vitals:   07/11/15 1333  BP: 134/77  Pulse: 82  Height: 5\' 3"  (1.6 m)  Weight: 235 lb 9.6 oz (106.867 kg)   Body mass index is 41.74 kg/(m^2). No exam data present   Physical Exam  General: Patient is well developed and well groomed. Patient is awake and alert and in no acute distress.  Respiratory: Lungs are clear to auscultation with normal respiratory effort.  Cardiovascular: Regular rate and rhythm with no murmurs.  No carotid bruit.  Musculoskeletal: no deformity  Skin: No rash, no bruising . Old surgical scar right wrist from carpal tunnel surgery  Neurologic Exam  Mental Status: Awake, alert and oriented to person, place and time. Language is fluent and comprehensive.  Cranial Nerves: Pupils are equal and round and reactive to light. Conjugate eye movements are full and symmetric. Visual fields are full to confrontation.  . Facial sensation and facial muscle strength are symmetric and in normal limits. Hearing is intact. Palate elevated symmetrically and uvula is midline. Shoulder shrug is symmetric. Tongue is midline.  Motor: Symmetric normal motor tone is noted throughout. Normal muscle bulk. Testing reveals 5/5 muscle strength except 4/5 left shoulder strength. Diminished fine finger movements on the left. Mild left hand weakness.  Orbits right over left upper extremity.  Sensory: Intact and symmetric to  light touch. Positive Tinel's sign over the right elbow Coordination: Cerebellar testing reveals good finger-to-nose and heel-to-shin bilaterally. Mild tremor noted, R>L Gait and Station: Arises from chair without difficulty. Narrow based gait with normal arm swing bilateral, drags left leg slightly.able to walk on heels and toes. Tandem walk is unsteady. No pronator drift  Reflexes: DTR in the upper and lower extremity are present and symmetric 2+.    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 She has mild persistent weakness and paresthesias in her left hand and gait difficulty yet. Moderate terminal right vertebrobasilar junction stenosis. New complaints of transient slurred and legs shaking with once brief loss of consciousness worrisome for limb shaking TIAs given her history of significant occlusive cerebrovascular disease. Also new complaint of right medial hand paresthesias likely from entrapment right ulnar neuropathy at the elbow  Plan:   I had a long d/w patient about his recent stroke, risk for recurrent stroke/TIAs, personally independently reviewed imaging studies and stroke evaluation results and answered questions.Continue Plavix for secondary stroke prevention and maintain strict control of hypertension with blood pressure goal below 130/90, diabetes with hemoglobin A1c goal below 6.5% and lipids with LDL cholesterol goal below 100 mg/dL. I also advised the patient to eat a healthy diet with plenty of whole grains, cereals, fruits and vegetables, exercise regularly and maintain ideal body weight .I also plan to check MRI scan of cervical spine to evaluate neck pain and paresthesias to look for compressive etiology. I also gave her a set of neck and  back stretching exercises to help with her pain and stiffness. She was advised to continue migraine Excedrin for migraines as well as Phenergan for nausea and was given a refill. She was also advised to consider possible participation in them migraine study if interested. Followup in the future with me in 6 months or call earlier if necessary         .            .       .           .       .       .         Return in about 6 months (around 01/08/2016).  Antony Contras, MD  07/11/2015, 10:09 PM [facility] [address], [place], [place] [address] [phone]  Note: This document was prepared with digital dictation  and possible smart Company secretary. Any transcriptional errors that result from this process are unintentional.

## 2015-07-11 NOTE — Patient Instructions (Signed)
I had a long d/w patient about his recent stroke, risk for recurrent stroke/TIAs, personally independently reviewed imaging studies and stroke evaluation results and answered questions.Continue Plavix for secondary stroke prevention and maintain strict control of hypertension with blood pressure goal below 130/90, diabetes with hemoglobin A1c goal below 6.5% and lipids with LDL cholesterol goal below 100 mg/dL. I also advised the patient to eat a healthy diet with plenty of whole grains, cereals, fruits and vegetables, exercise regularly and maintain ideal body weight .I also plan to check MRI scan of cervical spine to evaluate neck pain and paresthesias to look for compressive etiology. I also gave her a set of neck and back stretching exercises to help with her pain and stiffness. She was advised to continue migraine Excedrin for migraines as well as Phenergan for nausea and was given a refill. She was also advised to consider possible participation in them migraine study if interested. Followup in the future with me in 6 months or call earlier if necessary

## 2015-07-17 ENCOUNTER — Telehealth: Payer: Self-pay | Admitting: Neurology

## 2015-07-17 NOTE — Telephone Encounter (Signed)
Patient called stating she needs medication for muscle spasms. Dr Jannifer Franklin had instructed her last OV to call if she needed anything for muscle spasm. Please call and advise. Patient can be reached at [phone]

## 2015-07-19 ENCOUNTER — Other Ambulatory Visit: Payer: Self-pay | Admitting: Neurology

## 2015-07-19 MED ORDER — CYCLOBENZAPRINE HCL 5 MG PO TABS: 5.0000 mg | ORAL_TABLET | Freq: Two times a day (BID) | ORAL | Status: DC

## 2015-07-19 NOTE — Telephone Encounter (Signed)
Rn call patient back about her muscle spasms. Rn explain a message was sent to Dr.Sethi about pt needing something for muscle spasms. Pt was last seen on 07-11-15 with Dr. Leonie Man for an office visit. Pt stated she is schedule to have MRi in the next week. Rn stated to patient Dr.Sethi may want to evaluate the MRI before he prescribed anymore medications. Rn stated the message will be sent as high priority. Rn stated a phone number from Dr. Leonie Man will be given to him to call her back.

## 2015-07-19 NOTE — Telephone Encounter (Signed)
Spoke to patient about her back spasms and will eprescribe flexeril 5 mg bid x 3 days then 10 mg bid if tolerated. Discussed side effects and answered questions.

## 2015-07-19 NOTE — Telephone Encounter (Signed)
Patient is calling back about medication for muscle spasms. Patient states she called on Monday and then called back Tuesday and still has yet to hear anything. Please call patient [phone].

## 2015-07-22 ENCOUNTER — Ambulatory Visit
Admission: RE | Admit: 2015-07-22 | Discharge: 2015-07-22 | Disposition: A | Discharge status: Home / Self Care | Payer: PPO | Source: Ambulatory Visit | Attending: Neurology | Admitting: Neurology

## 2015-07-22 DIAGNOSIS — R202 Paresthesia of skin: Secondary | ICD-10-CM | POA: Diagnosis not present

## 2015-07-24 ENCOUNTER — Telehealth: Payer: Self-pay | Admitting: Neurology

## 2015-07-24 NOTE — Telephone Encounter (Signed)
Pt called and would like to know results of MRI. Please call and advise

## 2015-07-24 NOTE — Telephone Encounter (Signed)
Lft vm for patient regarding her MRI. Nurse stated on vm Dr.Sethi will call her with the results. MRI for patient was just done on 07-22-15.

## 2015-07-26 NOTE — Telephone Encounter (Signed)
Rn receiving incoming call from patient. Rn explain that her MRI c spine shows minor disc bulging at c 5-6 without any compression, and no need to change treatment. Patient verbalizes understanding.

## 2015-07-26 NOTE — Telephone Encounter (Signed)
I called and left message on answering machine about MRI C Spine results showing only minor disc bulging at C 5-6 without any compression and no need for any change in treatment at this time. She was advised to call me back if needed

## 2015-07-30 NOTE — Telephone Encounter (Signed)
-----   Message from Garvin Fila, MD sent at 07/24/2015  6:40 PM EDT ----- Mitchell Heir inform the patient had MRI scan of the neck shows only mild disc wear and tear changes between C5-6 and C6-7 but without any significant compression

## 2015-07-30 NOTE — Telephone Encounter (Signed)
-----   Message from Garvin Fila, MD sent at 07/30/2015  1:04 PM EDT ----- Mitchell Heir inform patient that NCV/EMG study was normal

## 2015-07-30 NOTE — Telephone Encounter (Signed)
Called patient and gave NCV/EMG results. Patient verbalized understanding.

## 2015-07-30 NOTE — Telephone Encounter (Signed)
Spoke to patient. Gave MRI results. Patient verbalized understanding.  

## 2015-08-16 ENCOUNTER — Telehealth: Payer: Self-pay | Admitting: *Deleted

## 2015-08-16 NOTE — Telephone Encounter (Signed)
Rn call patient about her DMV form for the handicap sticker. Patient stated the form is due in December 2016. Rn stated to patient that Dr. Leonie Man will not return to the office,and it will be filled out on that day. Pt stated that's fine,and it can be mailed back to her.

## 2015-08-16 NOTE — Telephone Encounter (Signed)
Form,DMV Parking Placard sent to Katrina and Dr Leonie Man 08/16/15.

## 2015-08-21 NOTE — Telephone Encounter (Signed)
Patient handicap form sent to patient in the mail.

## 2015-11-10 ENCOUNTER — Emergency Department (HOSPITAL_COMMUNITY): Payer: PPO

## 2015-11-10 ENCOUNTER — Inpatient Hospital Stay (HOSPITAL_COMMUNITY)
Admission: EM | Admit: 2015-11-10 | Discharge: 2015-11-12 | DRG: 069 | Disposition: A | Discharge status: Home / Self Care | Payer: PPO | Attending: Internal Medicine | Admitting: Internal Medicine

## 2015-11-10 ENCOUNTER — Encounter (HOSPITAL_COMMUNITY): Payer: Self-pay | Admitting: Emergency Medicine

## 2015-11-10 ENCOUNTER — Inpatient Hospital Stay (HOSPITAL_COMMUNITY): Payer: PPO

## 2015-11-10 DIAGNOSIS — R29810 Facial weakness: Secondary | ICD-10-CM | POA: Diagnosis not present

## 2015-11-10 DIAGNOSIS — G459 Transient cerebral ischemic attack, unspecified: Principal | ICD-10-CM | POA: Diagnosis present

## 2015-11-10 DIAGNOSIS — G8929 Other chronic pain: Secondary | ICD-10-CM | POA: Diagnosis not present

## 2015-11-10 DIAGNOSIS — R410 Disorientation, unspecified: Secondary | ICD-10-CM | POA: Diagnosis not present

## 2015-11-10 DIAGNOSIS — Z79899 Other long term (current) drug therapy: Secondary | ICD-10-CM

## 2015-11-10 DIAGNOSIS — K219 Gastro-esophageal reflux disease without esophagitis: Secondary | ICD-10-CM | POA: Diagnosis present

## 2015-11-10 DIAGNOSIS — F329 Major depressive disorder, single episode, unspecified: Secondary | ICD-10-CM | POA: Diagnosis present

## 2015-11-10 DIAGNOSIS — Z6837 Body mass index (BMI) 37.0-37.9, adult: Secondary | ICD-10-CM

## 2015-11-10 DIAGNOSIS — R531 Weakness: Secondary | ICD-10-CM | POA: Diagnosis present

## 2015-11-10 DIAGNOSIS — D751 Secondary polycythemia: Secondary | ICD-10-CM | POA: Diagnosis not present

## 2015-11-10 DIAGNOSIS — E860 Dehydration: Secondary | ICD-10-CM | POA: Diagnosis not present

## 2015-11-10 DIAGNOSIS — R51 Headache: Secondary | ICD-10-CM | POA: Diagnosis not present

## 2015-11-10 DIAGNOSIS — E8729 Other acidosis: Secondary | ICD-10-CM | POA: Diagnosis present

## 2015-11-10 DIAGNOSIS — F419 Anxiety disorder, unspecified: Secondary | ICD-10-CM | POA: Diagnosis present

## 2015-11-10 DIAGNOSIS — J069 Acute upper respiratory infection, unspecified: Secondary | ICD-10-CM | POA: Diagnosis not present

## 2015-11-10 DIAGNOSIS — E785 Hyperlipidemia, unspecified: Secondary | ICD-10-CM | POA: Diagnosis present

## 2015-11-10 DIAGNOSIS — M6289 Other specified disorders of muscle: Secondary | ICD-10-CM

## 2015-11-10 DIAGNOSIS — I69354 Hemiplegia and hemiparesis following cerebral infarction affecting left non-dominant side: Secondary | ICD-10-CM

## 2015-11-10 DIAGNOSIS — I951 Orthostatic hypotension: Secondary | ICD-10-CM | POA: Diagnosis not present

## 2015-11-10 DIAGNOSIS — I5032 Chronic diastolic (congestive) heart failure: Secondary | ICD-10-CM | POA: Diagnosis present

## 2015-11-10 DIAGNOSIS — J410 Simple chronic bronchitis: Secondary | ICD-10-CM

## 2015-11-10 DIAGNOSIS — R0689 Other abnormalities of breathing: Secondary | ICD-10-CM | POA: Insufficient documentation

## 2015-11-10 DIAGNOSIS — I2510 Atherosclerotic heart disease of native coronary artery without angina pectoris: Secondary | ICD-10-CM | POA: Diagnosis not present

## 2015-11-10 DIAGNOSIS — Z95828 Presence of other vascular implants and grafts: Secondary | ICD-10-CM | POA: Diagnosis not present

## 2015-11-10 DIAGNOSIS — G43909 Migraine, unspecified, not intractable, without status migrainosus: Secondary | ICD-10-CM | POA: Diagnosis not present

## 2015-11-10 DIAGNOSIS — R42 Dizziness and giddiness: Secondary | ICD-10-CM | POA: Diagnosis not present

## 2015-11-10 DIAGNOSIS — E872 Acidosis: Secondary | ICD-10-CM | POA: Diagnosis not present

## 2015-11-10 DIAGNOSIS — Z7901 Long term (current) use of anticoagulants: Secondary | ICD-10-CM | POA: Diagnosis not present

## 2015-11-10 DIAGNOSIS — R069 Unspecified abnormalities of breathing: Secondary | ICD-10-CM | POA: Diagnosis not present

## 2015-11-10 DIAGNOSIS — F17210 Nicotine dependence, cigarettes, uncomplicated: Secondary | ICD-10-CM | POA: Diagnosis present

## 2015-11-10 DIAGNOSIS — I6523 Occlusion and stenosis of bilateral carotid arteries: Secondary | ICD-10-CM | POA: Diagnosis present

## 2015-11-10 DIAGNOSIS — J441 Chronic obstructive pulmonary disease with (acute) exacerbation: Secondary | ICD-10-CM | POA: Diagnosis not present

## 2015-11-10 DIAGNOSIS — M6281 Muscle weakness (generalized): Secondary | ICD-10-CM | POA: Diagnosis not present

## 2015-11-10 DIAGNOSIS — J449 Chronic obstructive pulmonary disease, unspecified: Secondary | ICD-10-CM | POA: Diagnosis present

## 2015-11-10 DIAGNOSIS — Z7951 Long term (current) use of inhaled steroids: Secondary | ICD-10-CM | POA: Diagnosis not present

## 2015-11-10 DIAGNOSIS — Z9049 Acquired absence of other specified parts of digestive tract: Secondary | ICD-10-CM | POA: Diagnosis not present

## 2015-11-10 LAB — CBC
HEMATOCRIT: 46.7 % — AB (ref 36.0–46.0)
HEMOGLOBIN: 15.4 g/dL — AB (ref 12.0–15.0)
MCH: 28.6 pg (ref 26.0–34.0)
MCHC: 33 g/dL (ref 30.0–36.0)
MCV: 86.6 fL (ref 78.0–100.0)
Platelets: 193 10*3/uL (ref 150–400)
RBC: 5.39 MIL/uL — AB (ref 3.87–5.11)
RDW: 14.7 % (ref 11.5–15.5)
WBC: 8.4 10*3/uL (ref 4.0–10.5)

## 2015-11-10 LAB — RAPID URINE DRUG SCREEN, HOSP PERFORMED
Amphetamines: NOT DETECTED
BARBITURATES: NOT DETECTED
BENZODIAZEPINES: NOT DETECTED
Cocaine: NOT DETECTED
Opiates: NOT DETECTED
Tetrahydrocannabinol: NOT DETECTED

## 2015-11-10 LAB — DIFFERENTIAL
Basophils Absolute: 0 10*3/uL (ref 0.0–0.1)
Basophils Relative: 0 %
EOS ABS: 0.2 10*3/uL (ref 0.0–0.7)
EOS PCT: 2 %
LYMPHS ABS: 3.2 10*3/uL (ref 0.7–4.0)
LYMPHS PCT: 38 %
MONOS PCT: 5 %
Monocytes Absolute: 0.4 10*3/uL (ref 0.1–1.0)
Neutro Abs: 4.6 10*3/uL (ref 1.7–7.7)
Neutrophils Relative %: 55 %

## 2015-11-10 LAB — COMPREHENSIVE METABOLIC PANEL
ALK PHOS: 25 U/L — AB (ref 38–126)
ALT: 18 U/L (ref 14–54)
ANION GAP: 10 (ref 5–15)
AST: 17 U/L (ref 15–41)
Albumin: 3.4 g/dL — ABNORMAL LOW (ref 3.5–5.0)
BILIRUBIN TOTAL: 0.5 mg/dL (ref 0.3–1.2)
BUN: 11 mg/dL (ref 6–20)
CALCIUM: 9.1 mg/dL (ref 8.9–10.3)
CO2: 27 mmol/L (ref 22–32)
Chloride: 105 mmol/L (ref 101–111)
Creatinine, Ser: 0.94 mg/dL (ref 0.44–1.00)
GFR calc Af Amer: >60 mL/min (ref >60)
Glucose, Bld: 95 mg/dL (ref 65–99)
POTASSIUM: 4.2 mmol/L (ref 3.5–5.1)
Sodium: 142 mmol/L (ref 135–145)
TOTAL PROTEIN: 6.4 g/dL — AB (ref 6.5–8.1)

## 2015-11-10 LAB — I-STAT CHEM 8, ED
BUN: 14 mg/dL (ref 6–20)
CALCIUM ION: 1.17 mmol/L (ref 1.12–1.23)
CREATININE: 0.9 mg/dL (ref 0.44–1.00)
Chloride: 103 mmol/L (ref 101–111)
Glucose, Bld: 88 mg/dL (ref 65–99)
HEMATOCRIT: 51 % — AB (ref 36.0–46.0)
HEMOGLOBIN: 17.3 g/dL — AB (ref 12.0–15.0)
Potassium: 3.9 mmol/L (ref 3.5–5.1)
SODIUM: 141 mmol/L (ref 135–145)
TCO2: 29 mmol/L (ref 0–100)

## 2015-11-10 LAB — URINALYSIS, ROUTINE W REFLEX MICROSCOPIC
BILIRUBIN URINE: NEGATIVE
GLUCOSE, UA: NEGATIVE mg/dL
HGB URINE DIPSTICK: NEGATIVE
KETONES UR: NEGATIVE mg/dL
Leukocytes, UA: NEGATIVE
Nitrite: NEGATIVE
PROTEIN: NEGATIVE mg/dL
Specific Gravity, Urine: 1.017 (ref 1.005–1.030)
pH: 5 (ref 5.0–8.0)

## 2015-11-10 LAB — I-STAT TROPONIN, ED: TROPONIN I, POC: 0 ng/mL (ref 0.00–0.08)

## 2015-11-10 LAB — I-STAT ARTERIAL BLOOD GAS, ED
Acid-Base Excess: 2 mmol/L (ref 0.0–2.0)
Bicarbonate: 29.6 mEq/L — ABNORMAL HIGH (ref 20.0–24.0)
O2 Saturation: 98 %
PCO2 ART: 56 mmHg — AB (ref 35.0–45.0)
PH ART: 7.331 — AB (ref 7.350–7.450)
PO2 ART: 120 mmHg — AB (ref 80.0–100.0)
Patient temperature: 98.6
TCO2: 31 mmol/L (ref 0–100)

## 2015-11-10 LAB — PROTIME-INR
INR: 1.13 (ref 0.00–1.49)
Prothrombin Time: 14.7 seconds (ref 11.6–15.2)

## 2015-11-10 LAB — ETHANOL: Alcohol, Ethyl (B): <5 mg/dL (ref <5)

## 2015-11-10 LAB — APTT: aPTT: 27 seconds (ref 24–37)

## 2015-11-10 MED ORDER — FENOFIBRATE 160 MG PO TABS
160.0000 mg | ORAL_TABLET | Freq: Every day | ORAL | Status: DC
  Administered 2015-11-10 – 2015-11-11 (×2): 160 mg via ORAL
  Filled 2015-11-10 (×3): qty 1

## 2015-11-10 MED ORDER — STROKE: EARLY STAGES OF RECOVERY BOOK
Freq: Once | Status: DC
  Filled 2015-11-10: qty 1

## 2015-11-10 MED ORDER — SIMVASTATIN 40 MG PO TABS
40.0000 mg | ORAL_TABLET | Freq: Every day | ORAL | Status: DC
  Administered 2015-11-10 – 2015-11-11 (×2): 40 mg via ORAL
  Filled 2015-11-10 (×3): qty 1

## 2015-11-10 MED ORDER — CYCLOSPORINE 0.05 % OP EMUL
1.0000 [drp] | Freq: Two times a day (BID) | OPHTHALMIC | Status: DC
  Administered 2015-11-11: 1 [drp] via OPHTHALMIC
  Filled 2015-11-10 (×5): qty 1

## 2015-11-10 MED ORDER — SODIUM CHLORIDE 0.9 % IV SOLN
INTRAVENOUS | Status: AC
  Administered 2015-11-10: 23:00:00 via INTRAVENOUS

## 2015-11-10 MED ORDER — TIOTROPIUM BROMIDE MONOHYDRATE 18 MCG IN CAPS
18.0000 ug | ORAL_CAPSULE | Freq: Every day | RESPIRATORY_TRACT | Status: DC
  Administered 2015-11-11: 18 ug via RESPIRATORY_TRACT
  Filled 2015-11-10: qty 5

## 2015-11-10 MED ORDER — SENNOSIDES-DOCUSATE SODIUM 8.6-50 MG PO TABS
1.0000 | ORAL_TABLET | Freq: Every evening | ORAL | Status: DC | PRN
  Filled 2015-11-10: qty 1

## 2015-11-10 MED ORDER — ASPIRIN 325 MG PO TABS: 325.0000 mg | ORAL_TABLET | Freq: Every day | ORAL | Status: DC

## 2015-11-10 MED ORDER — VENLAFAXINE HCL ER 75 MG PO CP24
150.0000 mg | ORAL_CAPSULE | Freq: Every day | ORAL | Status: DC
  Administered 2015-11-11 – 2015-11-12 (×2): 150 mg via ORAL
  Filled 2015-11-10: qty 1
  Filled 2015-11-10: qty 2

## 2015-11-10 MED ORDER — TRAZODONE HCL 100 MG PO TABS
300.0000 mg | ORAL_TABLET | Freq: Every day | ORAL | Status: DC
  Administered 2015-11-10 – 2015-11-11 (×2): 300 mg via ORAL
  Filled 2015-11-10 (×2): qty 3

## 2015-11-10 MED ORDER — MELATONIN 5 MG PO TABS: 5.0000 mg | ORAL_TABLET | Freq: Every day | ORAL | Status: DC

## 2015-11-10 MED ORDER — ALBUTEROL SULFATE (2.5 MG/3ML) 0.083% IN NEBU
5.0000 mg | INHALATION_SOLUTION | Freq: Once | RESPIRATORY_TRACT | Status: AC
  Administered 2015-11-10: 5 mg via RESPIRATORY_TRACT
  Filled 2015-11-10: qty 6

## 2015-11-10 MED ORDER — ADULT MULTIVITAMIN W/MINERALS CH
1.0000 | ORAL_TABLET | Freq: Every day | ORAL | Status: DC
  Administered 2015-11-11 – 2015-11-12 (×2): 1 via ORAL
  Filled 2015-11-10 (×2): qty 1

## 2015-11-10 MED ORDER — ASPIRIN 300 MG RE SUPP: 300.0000 mg | Freq: Every day | RECTAL | Status: DC

## 2015-11-10 MED ORDER — PANTOPRAZOLE SODIUM 40 MG PO TBEC
40.0000 mg | DELAYED_RELEASE_TABLET | Freq: Every day | ORAL | Status: DC
  Administered 2015-11-11 – 2015-11-12 (×2): 40 mg via ORAL
  Filled 2015-11-10 (×2): qty 1

## 2015-11-10 MED ORDER — IPRATROPIUM-ALBUTEROL 0.5-2.5 (3) MG/3ML IN SOLN
3.0000 mL | RESPIRATORY_TRACT | Status: DC | PRN
  Administered 2015-11-11: 3 mL via RESPIRATORY_TRACT
  Filled 2015-11-10: qty 3

## 2015-11-10 MED ORDER — ENOXAPARIN SODIUM 40 MG/0.4ML ~~LOC~~ SOLN
40.0000 mg | Freq: Every day | SUBCUTANEOUS | Status: DC
  Administered 2015-11-10 – 2015-11-11 (×2): 40 mg via SUBCUTANEOUS
  Filled 2015-11-10 (×3): qty 0.4

## 2015-11-10 MED ORDER — SALINE SPRAY 0.65 % NA SOLN: 2.0000 | Freq: Three times a day (TID) | NASAL | Status: DC | PRN

## 2015-11-10 MED ORDER — ZOLPIDEM TARTRATE 5 MG PO TABS
5.0000 mg | ORAL_TABLET | Freq: Every day | ORAL | Status: DC
  Filled 2015-11-10 (×2): qty 1

## 2015-11-10 MED ORDER — CLONAZEPAM 0.5 MG PO TABS
0.5000 mg | ORAL_TABLET | Freq: Two times a day (BID) | ORAL | Status: DC
  Administered 2015-11-10 – 2015-11-12 (×4): 0.5 mg via ORAL
  Filled 2015-11-10 (×4): qty 1

## 2015-11-10 MED ORDER — CLOPIDOGREL BISULFATE 75 MG PO TABS
75.0000 mg | ORAL_TABLET | Freq: Every day | ORAL | Status: DC
  Administered 2015-11-11 – 2015-11-12 (×2): 75 mg via ORAL
  Filled 2015-11-10 (×2): qty 1

## 2015-11-10 MED ORDER — CYCLOBENZAPRINE HCL 10 MG PO TABS
10.0000 mg | ORAL_TABLET | Freq: Three times a day (TID) | ORAL | Status: DC | PRN
  Administered 2015-11-11 (×2): 10 mg via ORAL
  Filled 2015-11-10 (×2): qty 1

## 2015-11-10 NOTE — H&P (Signed)
[facility] History and Physical  April [name] [id number] DOB: [birth year] DOA: 11/10/2015  Referring physician: ED physician PCP: April Seller, MD  Specialists:  April [name], neurology  Chief Complaint:  Left-sided weakness   HPI: April [name] is a 60 y.o. 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 April [name] has just completed a course of Augmentin, reportedly for acute bronchitis, and had been enjoying improvement in her respiratory symptoms when her sister found her to be unusually confused during a telephone conversation on 11/08/2015. The patient acknowledges some increased cognitive difficulty over the past 2-3 days, but it was not until she woke the morning of her admission that she appreciated some left-sided weakness. She describes increased difficulty with ambulating secondary to the weakness, as well as a left-sided facial droop. She denies recent fever, chills, trauma, chest pain, palpitations, headache, or loss of coordination. She denies any recent change in her medications. She denies use of alcohol or illicit substances. Per report, a neighbor became aware of the patient's left sided weakness this afternoon and notified the patient's sister for EMS was activated.  In ED, patient was found to be afebrile, saturating well on room air, and with vital signs stable. Stroke alert was activated and April [name] of neurology was present to evaluate the patient. The patient arrived outside the time frame for tPA. Noncontrast head CT was negative for acute process and initial blood work was largely unremarkable. ABG was obtained in the ED and is notable for a partially compensated respiratory acidosis. Patient was found to have very subtle neurologic deficits and  was recommended by the consultant for admission to the hospital to continue evaluation and management of suspected CVA/TIA.  Where does patient [facility]?  [facility] Can patient participate in ADLs?  Yes      Review of Systems:   General: no fevers, chills, sweats, weight change, poor appetite, or fatigue HEENT: no blurry vision, hearing changes or sore throat Pulm:  No wheeze. Dyspnea and productive cough.  CV: no chest pain or palpitations Abd: no nausea, vomiting, abdominal pain, diarrhea, or constipation GU: no dysuria, hematuria, increased urinary frequency, or urgency  Ext: no leg edema Neuro:  Left-sided weakness, paraesthesias, confusion, no vision change or hearing loss Skin: no rash, no wounds MSK: No muscle spasm, no deformity, no red, hot, or swollen joint Heme: No easy bruising or bleeding Travel history: No recent long distant travel    Allergy: No Known Allergies  Past Medical History  Diagnosis Date  . Stroke ([place])   . Hyperlipidemia   . COPD (chronic obstructive pulmonary disease) ([place])   . Coronary artery disease   . Anxiety   . Depression   . Shortness of breath   . Incontinent of urine   . GERD (gastroesophageal reflux disease)   . Headache(784.0)   . Arthritis     Past Surgical History  Procedure Laterality Date  . Carotid stent    . Cholecystectomy    . Tubal ligation      Social History:  reports that she has been smoking Cigarettes.  She has a 20 pack-year smoking history. She has never used smokeless tobacco. She reports that she does not drink alcohol or use illicit drugs.  Family History:  Family History  Problem Relation Age of Onset  . Migraines Mother   .  Heart attack Mother      Prior to Admission medications   Medication Sig Start Date End Date Taking? Authorizing Provider  albuterol (PROVENTIL HFA;VENTOLIN HFA) 108 (90 Base) MCG/ACT inhaler Inhale 2 puffs into the lungs every 6 (six) hours as needed for wheezing or shortness  of breath.   Yes Historical Provider, MD  aspirin-acetaminophen-caffeine (EXCEDRIN MIGRAINE) [phone] MG per tablet Take 2 tablets by mouth every 6 (six) hours as needed for migraine.  02/07/15  Yes Historical Provider, MD  clonazePAM (KLONOPIN) 0.5 MG tablet TAKE 1 TABLET BY MOUTH TWICE DAILY 06/06/15  Yes Historical Provider, MD  clopidogrel (PLAVIX) 75 MG tablet Take 75 mg by mouth daily.   Yes Historical Provider, MD  cyclobenzaprine (FLEXERIL) 5 MG tablet Take 1 tablet (5 mg total) by mouth 2 (two) times daily. 1 tab twice daily x 3 days then increase to 2 tab twice daily if tolerated Patient taking differently: Take 10 mg by mouth 3 (three) times daily.  07/19/15  Yes Garvin Fila, MD  cycloSPORINE (RESTASIS) 0.05 % ophthalmic emulsion Place 1 drop into both eyes 2 (two) times daily.   Yes Historical Provider, MD  docusate sodium (COLACE) 100 MG capsule Take 100 mg by mouth daily as needed (constipation).    Yes Historical Provider, MD  fenofibrate (TRICOR) 145 MG tablet Take 145 mg by mouth at bedtime.    Yes Historical Provider, MD  fluticasone (FLONASE) 50 MCG/ACT nasal spray Place 2 sprays into both nostrils daily as needed for allergies or rhinitis.   Yes Historical Provider, MD  furosemide (LASIX) 20 MG tablet Take 20 mg by mouth daily as needed (ankle/feet swelling).  01/14/15  Yes Historical Provider, MD  Melatonin 5 MG TABS Take 5 mg by mouth at bedtime.    Yes Historical Provider, MD  meloxicam (MOBIC) 15 MG tablet Take 15 mg by mouth daily as needed (back pain).  01/31/15  Yes Historical Provider, MD  Multiple Vitamin (MULTIVITAMIN WITH MINERALS) TABS tablet Take 1 tablet by mouth daily.   Yes Historical Provider, MD  pantoprazole (PROTONIX) 40 MG tablet Take 40 mg by mouth daily.  02/01/15  Yes Historical Provider, MD  promethazine (PHENERGAN) 25 MG tablet Take 1 tablet (25 mg total) by mouth every 6 (six) hours as needed for nausea or vomiting. Patient taking differently: Take 25 mg by  mouth every 6 (six) hours as needed (migraine headaches). Take with tramadol 07/11/15  Yes Garvin Fila, MD  simvastatin (ZOCOR) 40 MG tablet Take 40 mg by mouth at bedtime.   Yes Historical Provider, MD  sodium chloride (OCEAN) 0.65 % SOLN nasal spray Place 2 sprays into both nostrils 3 (three) times daily as needed for congestion.    Yes Historical Provider, MD  tiotropium (SPIRIVA) 18 MCG inhalation capsule Place 18 mcg into inhaler and inhale daily.    Yes Historical Provider, MD  traMADol (ULTRAM) 50 MG tablet Take 1-2 tablets (50-100 mg total) by mouth every 6 (six) hours as needed (for headaches). Patient taking differently: Take 50 mg by mouth every 6 (six) hours as needed (migraine headaches). Take with phenergan 11/16/14  Yes Garvin Fila, MD  traZODone (DESYREL) 150 MG tablet Take 300 mg by mouth at bedtime.    Yes Historical Provider, MD  venlafaxine XR (EFFEXOR-XR) 150 MG 24 hr capsule Take 150 mg by mouth daily.   Yes Historical Provider, MD  amoxicillin-clavulanate (AUGMENTIN) 875-125 MG tablet Take 1 tablet by mouth 2 (two) times daily. Reported  on 11/10/2015 10/31/15   Historical Provider, MD    Physical Exam: Filed Vitals:   11/10/15 2109 11/10/15 2115 11/10/15 2130 11/10/15 2133  BP:  120/66 110/66   Pulse:  74 82   Temp:    99.1 F (37.3 C)  TempSrc:    Rectal  Resp:  23 23   SpO2: 93% 98% 91%    General: Not in acute distress HEENT:       Eyes: PERRL, EOMI, no scleral icterus or conjunctival pallor.       ENT: No discharge from the ears or nose, no pharyngeal ulcers, petechiae or exudate, no tonsillar enlargement.        Neck: No JVD, no bruit, no appreciable mass Heme: No cervical adenopathy, no pallor Cardiac: S1/S2, RRR, No murmurs, No gallops or rubs. Pulm: Good air movement bilaterally. Coarse ronchi bilaterally, expiratory wheezes. Abd: Soft, nondistended, nontender, no rebound pain or gaurding, no mass or organomegaly, BS present. Ext: No LE edema  bilaterally. 2+DP/PT pulse bilaterally. Musculoskeletal: No gross deformity, no red, hot, swollen joints  Skin: No rashes or wounds on exposed surfaces  Neuro: Alert, oriented X3, cranial nerves II-XII grossly intact, muscle strength 5/5 in right UE and LE, left grip 4/5, left plantar and dorsiflexion 4/5, sensation to light touch intact. Brachial reflex 1+ bilaterally. Knee reflex 2+ bilaterally. Negative Babinski's sign.   Psych: Patient is not overtly psychotic, appropriate mood and affect.  Labs on Admission:  Basic Metabolic Panel:  Recent Labs Lab 11/10/15 1610 11/10/15 1628  NA 142 141  K 4.2 3.9  CL 105 103  CO2 27  --   GLUCOSE 95 88  BUN 11 14  CREATININE 0.94 0.90  CALCIUM 9.1  --    Liver Function Tests:  Recent Labs Lab 11/10/15 1610  AST 17  ALT 18  ALKPHOS 25*  BILITOT 0.5  PROT 6.4*  ALBUMIN 3.4*   No results for input(s): LIPASE, AMYLASE in the last 168 hours. No results for input(s): AMMONIA in the last 168 hours. CBC:  Recent Labs Lab 11/10/15 1610 11/10/15 1628  WBC 8.4  --   NEUTROABS 4.6  --   HGB 15.4* 17.3*  HCT 46.7* 51.0*  MCV 86.6  --   PLT 193  --    Cardiac Enzymes: No results for input(s): CKTOTAL, CKMB, CKMBINDEX, TROPONINI in the last 168 hours.  BNP (last 3 results) No results for input(s): BNP in the last 8760 hours.  ProBNP (last 3 results) No results for input(s): PROBNP in the last 8760 hours.  CBG: No results for input(s): GLUCAP in the last 168 hours.  Radiological Exams on Admission: Dg Chest 2 View  11/10/2015  CLINICAL DATA:  Acute onset of chest congestion and left-sided weakness. Initial encounter. EXAM: CHEST  2 VIEW COMPARISON:  Chest radiograph performed 01/05/2013 FINDINGS: The lungs are well-aerated. Mild vascular congestion is noted. Peribronchial thickening is seen. Mild bibasilar atelectasis is noted. There is no evidence of pleural effusion or pneumothorax. The heart is normal in size; the mediastinal  contour is within normal limits. No acute osseous abnormalities are seen. Clips are noted within the right upper quadrant, reflecting prior cholecystectomy. IMPRESSION: Mild vascular congestion noted. Peribronchial thickening seen. Mild bibasilar atelectasis noted. Electronically Signed   By: Garald Balding M.D.   On: 11/10/2015 20:36   Ct Head Wo Contrast  11/10/2015  CLINICAL DATA:  Increased left-sided weakness for 2 days, headache, history stroke, hyperlipidemia, COPD, coronary artery disease, smoker EXAM: CT  HEAD WITHOUT CONTRAST TECHNIQUE: Contiguous axial images were obtained from the base of the skull through the vertex without intravenous contrast. COMPARISON:  02/27/2015 FINDINGS: Mild generalized atrophy. Normal ventricular morphology. No midline shift or mass effect. Old RIGHT parietal watershed infarct, unchanged. No intracranial hemorrhage, mass lesion, or evidence acute infarction. No extra-axial fluid collections. Mild mucosal thickening RIGHT maxillary sinus. Bones and sinuses otherwise unremarkable. IMPRESSION: Old RIGHT parietal infarct. No acute intracranial abnormalities. Electronically Signed   By: Lavonia Dana M.D.   On: 11/10/2015 20:18    EKG: Independently reviewed.  Abnormal findings:  Sinus rhythm, low-voltage QRS, non-specific repolarization abnormality, not significantly changed from prior  Assessment/Plan  1. Left-sided weakness, confusion  - Pt high-risk with chronic occlusion of b/l carotids, stents placed to b/l vertebral arteries  - Outside window for tPA - Neurology consultants much appreciated  - Frequent neuro checks overnight  - Continue Plavix, statin  - MRI and MRA brain wo contrast ordered  - Carotid dopplers ordered  - TTE ordered  - Check A1c, fasting lipids  - PT/OT/SLP consults  - Passed bedside swallow, will allow diet   2. COPD with partially compensated respiratory acidosis  - Just completed course of abx for acute bronchitis  - Pt reports  significant improvement in respiratory status over last few days  - Saturating well on rm air - ABG features a partially-compensated respiratory acidosis; mild hypercarbia unlikely to be causing neuro sxs  - Monitor with pulse oximetry, supplemental O2 prn sats <92%  - Continue Spiriva  - DuoNebs available q4h prn   3. Chronic diastolic CHF  - TTE (0000000) with EF 0000000, grade 1 diastolic dysfunction  - No peripheral edema; mild vasc congestion on CXR  - Pt appears slightly dry clinically  - Hold Lasix, gentle IVF hydration with NS at 75 cc/hr overnight  - Monitor fluid status, resume Lasix when appropriate  4. Polycythemia  - Hgb 15.4, Hct 46.7 on admission  - Likely relative, expect resolution with gentle IVF hydration  - Repeat CBC in am      DVT ppx:  SQ Lovenox    Code Status: Full code Family Communication:  Yes, patient's sister at bed side Disposition Plan: Admit to inpatient   Date of Service 11/10/2015    Vianne Bulls, MD [facility] Pager [phone]  If 7PM-7AM, please contact night-coverage [email or web address] Password [facility] 11/10/2015, 10:25 PM

## 2015-11-10 NOTE — ED Notes (Signed)
Per EMS pt recently treated for pneumonia and just finished her antibiotic therapy and still feels congested. When speaking to the pt she reports she did come in for that but also for new left sided weakness and left facial numbness that started two days ago. Pt reports that she has a hx of stroke with left sided weakness but this feels more severe.

## 2015-11-10 NOTE — ED Notes (Signed)
Pt escorted to room D30 via wheelchair

## 2015-11-10 NOTE — ED Notes (Signed)
Ray MD at bedside 

## 2015-11-10 NOTE — Consult Note (Signed)
Stroke Consult Consulting Physician:   Chief Complaint: weakness, confusion HPI: April [name] is an 60 y.o. female hx of multiple strokes presenting with 2 day history of increased left-sided weakness and confusion. LSW > 2 days ago. Sister reports talking to patient on Thursday and states she did not seem like herself at that time. Patient reports feeling confused and notes that her LUE weakness is worse compared to baseline. Notes increased difficulty walking. Patient recently treated for respiratory infection, reports just finishing course of antibiotics, continues to note coughing and congestion.  CT head imaging reviewed, shows prior right parietal infarct. Lab work unremarkable, UA pending.   Followed by Dr Leonie Man. CT angio head and neck from 02/2015 show occlusion of the right ICA, proximal left ICA occlusion and patent bilateral vertebral stents. Takes Plavix 75mg  daily at home.   Date last known well: 11/08/2015 Time last known well: unclear tPA Given: no, outside tPA window Modified Rankin: Rankin Score=1  Past Medical History  Diagnosis Date  . Stroke ([place])   . Hyperlipidemia   . COPD (chronic obstructive pulmonary disease) ([place])   . Coronary artery disease   . Anxiety   . Depression   . Shortness of breath   . Incontinent of urine   . GERD (gastroesophageal reflux disease)   . Headache(784.0)   . Arthritis     Past Surgical History  Procedure Laterality Date  . Carotid stent    . Cholecystectomy    . Tubal ligation      Family History  Problem Relation Age of Onset  . Migraines Mother   . Heart attack Mother    Social History:  reports that she has been smoking Cigarettes.  She has a 20 pack-year smoking history. She has never used smokeless tobacco. She reports that she does not drink alcohol or use illicit drugs.  Allergies: No Known Allergies   (Not in a hospital admission)  ROS: Out of a complete 14 system review, the patient complains of only the  following symptoms, and all other reviewed systems are negative. +weakness  Physical Examination: Filed Vitals:   11/10/15 1559 11/10/15 1933  BP: 138/71 148/69  Pulse: 86 80  Temp: 98.1 F (36.7 C)   Resp: 20 25   Physical Exam  Constitutional: He appears well-developed and well-nourished.  Psych: Affect appropriate to situation Eyes: No scleral injection HENT: No OP obstrucion Head: Normocephalic.  Cardiovascular: Normal rate and regular rhythm.  Respiratory: Effort normal and breath sounds normal.  GI: Soft. Bowel sounds are normal. No distension. There is no tenderness.  Skin: WDI   Neurologic Examination: Mental Status: Alert, oriented, thought content appropriate.  Speech fluent without evidence of aphasia.  Able to follow 3 step commands without difficulty. Cranial Nerves: II: funduscopic exam wnl bilaterally, visual fields grossly normal, pupils equal, round, reactive to light and accommodation III,IV, VI: ptosis not present, extra-ocular motions intact bilaterally V,VII: minimal left-sided facial weakness, facial light touch sensation diminished on the left VIII: hearing normal bilaterally IX,X: gag reflex present XI: trapezius strength/neck flexion strength normal bilaterally XII: tongue strength normal  Motor: exaggerated pronator type drift on the LUE Right : Upper extremity    Left:     Upper extremity 5/5 deltoid       4+/5 deltoid 5/5 biceps      5-/5 biceps  5/5 triceps      5-/5 triceps 5/5 hand grip      5-/5 hand grip  Lower extremity  Lower extremity 5-/5 hip flexor      5-/5 hip flexor 5-/5 quadricep      5-/5 quadriceps  5-/5 hamstrings     5-/5 hamstrings 5/5 plantar flexion       5/5 plantar flexion 5/5 plantar extension     5/5 plantar extension Tone and bulk:normal tone throughout; no atrophy noted Sensory: Pinprick and light touch intact throughout, bilaterally Deep Tendon Reflexes: decreased LT and PP on left side Plantars: Right:  downgoing   Left: downgoing Cerebellar: normal finger-to-nose, and normal heel-to-shin test Gait: deferred  Laboratory Studies:   Basic Metabolic Panel:  Recent Labs Lab 11/10/15 1610 11/10/15 1628  NA 142 141  K 4.2 3.9  CL 105 103  CO2 27  --   GLUCOSE 95 88  BUN 11 14  CREATININE 0.94 0.90  CALCIUM 9.1  --     Liver Function Tests:  Recent Labs Lab 11/10/15 1610  AST 17  ALT 18  ALKPHOS 25*  BILITOT 0.5  PROT 6.4*  ALBUMIN 3.4*   No results for input(s): LIPASE, AMYLASE in the last 168 hours. No results for input(s): AMMONIA in the last 168 hours.  CBC:  Recent Labs Lab 11/10/15 1610 11/10/15 1628  WBC 8.4  --   NEUTROABS 4.6  --   HGB 15.4* 17.3*  HCT 46.7* 51.0*  MCV 86.6  --   PLT 193  --     Cardiac Enzymes: No results for input(s): CKTOTAL, CKMB, CKMBINDEX, TROPONINI in the last 168 hours.  BNP: Invalid input(s): POCBNP  CBG: No results for input(s): GLUCAP in the last 168 hours.  Microbiology: Results for orders placed or performed during the hospital encounter of 12/26/09  MRSA PCR Screening     Status: None   Collection Time: 12/26/09  2:06 PM  Result Value Ref Range Status   MRSA by PCR  NEGATIVE Final    NEGATIVE        The GeneXpert MRSA Assay (FDA approved for NASAL specimens only), is one component of a comprehensive MRSA colonization surveillance program. It is not intended to diagnose MRSA infection nor to guide or monitor treatment for MRSA infections.    Coagulation Studies:  Recent Labs  11/10/15 1610  LABPROT 14.7  INR 1.13    Urinalysis: No results for input(s): COLORURINE, LABSPEC, PHURINE, GLUCOSEU, HGBUR, BILIRUBINUR, KETONESUR, PROTEINUR, UROBILINOGEN, NITRITE, LEUKOCYTESUR in the last 168 hours.  Invalid input(s): APPERANCEUR  Lipid Panel:     Component Value Date/Time   CHOL * 12/06/2009 0515    201        ATP III CLASSIFICATION:  <200     mg/dL   Desirable  200-239  mg/dL   Borderline  High  >=240    mg/dL   High          TRIG 259* 12/06/2009 0515   HDL 27* 12/06/2009 0515   CHOLHDL 7.4 12/06/2009 0515   VLDL 52* 12/06/2009 0515   LDLCALC * 12/06/2009 0515    122        Total Cholesterol/HDL:CHD Risk Coronary Heart Disease Risk Table                     Men   Women  1/2 Average Risk   3.4   3.3  Average Risk       5.0   4.4  2 X Average Risk   9.6   7.1  3 X Average Risk  23.4   11.0  Use the calculated Patient Ratio above and the CHD Risk Table to determine the patient's CHD Risk.        ATP III CLASSIFICATION (LDL):  <100     mg/dL   Optimal  100-129  mg/dL   Near or Above                    Optimal  130-159  mg/dL   Borderline  160-189  mg/dL   High  >190     mg/dL   Very High    HgbA1C:  Lab Results  Component Value Date   HGBA1C  12/05/2009    5.6 (NOTE) The ADA recommends the following therapeutic goal for glycemic control related to Hgb A1c measurement: Goal of therapy: <6.5 Hgb A1c  Reference: American Diabetes Association: Clinical Practice Recommendations 2010, Diabetes Care, 2010, 33: (Suppl  1).    Urine Drug Screen:     Component Value Date/Time   LABOPIA NONE DETECTED 12/05/2009 1539   COCAINSCRNUR NONE DETECTED 12/05/2009 1539   LABBENZ POSITIVE* 12/05/2009 1539   AMPHETMU NONE DETECTED 12/05/2009 1539   THCU NONE DETECTED 12/05/2009 1539   LABBARB  12/05/2009 1539    NONE DETECTED        DRUG SCREEN FOR MEDICAL PURPOSES ONLY.  IF CONFIRMATION IS NEEDED FOR ANY PURPOSE, NOTIFY LAB WITHIN 5 DAYS.        LOWEST DETECTABLE LIMITS FOR URINE DRUG SCREEN Drug Class       Cutoff (ng/mL) Amphetamine      1000 Barbiturate      200 Benzodiazepine   [id number] Tricyclics       [id number] Opiates          300 Cocaine          300 THC              50    Alcohol Level:  Recent Labs Lab 11/10/15 [place] <5    Other results:  Imaging: Dg Chest 2 View  11/10/2015  CLINICAL DATA:  Acute onset of chest congestion and left-sided  weakness. Initial encounter. EXAM: CHEST  2 VIEW COMPARISON:  Chest radiograph performed 01/05/2013 FINDINGS: The lungs are well-aerated. Mild vascular congestion is noted. Peribronchial thickening is seen. Mild bibasilar atelectasis is noted. There is no evidence of pleural effusion or pneumothorax. The heart is normal in size; the mediastinal contour is within normal limits. No acute osseous abnormalities are seen. Clips are noted within the right upper quadrant, reflecting prior cholecystectomy. IMPRESSION: Mild vascular congestion noted. Peribronchial thickening seen. Mild bibasilar atelectasis noted. Electronically Signed   By: Garald Balding M.D.   On: 11/10/2015 20:36   Ct Head Wo Contrast  11/10/2015  CLINICAL DATA:  Increased left-sided weakness for 2 days, headache, history stroke, hyperlipidemia, COPD, coronary artery disease, smoker EXAM: CT HEAD WITHOUT CONTRAST TECHNIQUE: Contiguous axial images were obtained from the base of the skull through the vertex without intravenous contrast. COMPARISON:  02/27/2015 FINDINGS: Mild generalized atrophy. Normal ventricular morphology. No midline shift or mass effect. Old RIGHT parietal watershed infarct, unchanged. No intracranial hemorrhage, mass lesion, or evidence acute infarction. No extra-axial fluid collections. Mild mucosal thickening RIGHT maxillary sinus. Bones and sinuses otherwise unremarkable. IMPRESSION: Old RIGHT parietal infarct. No acute intracranial abnormalities. Electronically Signed   By: Lavonia Dana M.D.   On: 11/10/2015 20:18    Assessment: 60 y.o. female hx of multiple strokes, bilateral carotid and vertebral disease presenting with 2 day history of increased  confusion and left-sided motor and sensory deficits. CT head negative for acute process. Exam does not appear markedly worse compared to last visit with outpatient neurologist though with known large vessel disease cannot rule out new CVA. Will plan for admission and stroke  workup.   Plan: 1. HgbA1c, fasting lipid panel 2. MRI, MRA  of the brain without contrast 3. PT consult, OT consult, Speech consult 4. Echocardiogram 5. Carotid dopplers 6. Prophylactic therapy-Plavix 75mg   7. Risk factor modification 8. Telemetry monitoring 9. Frequent neuro checks 10. NPO until RN stroke swallow screen    Jim Like, DO [facility] [phone]  If 7pm- 7am, please page neurology on call as listed in [place]. 11/10/2015, 8:53 PM

## 2015-11-10 NOTE — ED Notes (Signed)
MD at bedside. 

## 2015-11-10 NOTE — ED Provider Notes (Addendum)
CSN: [id number]     Arrival date & time 11/10/15  1557 History   First MD Initiated Contact with Patient 11/10/15 1928     Chief Complaint  Patient presents with  . Stroke Symptoms   Level V caveat secondary to patient confusion  (Consider location/radiation/quality/duration/timing/severity/associated sxs/prior Treatment) HPI This 60 year old female with a history of multiple strokes stented post bilateral carotid stents presents today complaining of confusion and having increased left sided weakness. Last known normal is unknown. Her sister is at the bedside and states that she spoke to her on Thursday and she sounded not quite like herself. She then received a call from someone who is in the same apartment building stating that she was confused and seemed weak today. The patient states that she feels somewhat confused and has left face and arm weakness that is worse than her baseline. She is having increased difficulty walking. She denies headache, chest pain, nausea, vomiting, diarrhea, use of illicit drugs, or alcohol. She states that she has had a recent URI and has some continued cough and congestion. She is a cigarette smoker and has continued to smoke. Past Medical History  Diagnosis Date  . Stroke ([place])   . Hyperlipidemia   . COPD (chronic obstructive pulmonary disease) ([place])   . Coronary artery disease   . Anxiety   . Depression   . Shortness of breath   . Incontinent of urine   . GERD (gastroesophageal reflux disease)   . Headache(784.0)   . Arthritis    Past Surgical History  Procedure Laterality Date  . Carotid stent    . Cholecystectomy    . Tubal ligation     Family History  Problem Relation Age of Onset  . Migraines Mother   . Heart attack Mother    Social History  Substance Use Topics  . Smoking status: Current Some Day Smoker -- 0.50 packs/day for 40 years    Types: Cigarettes  . Smokeless tobacco: Never Used  . Alcohol Use: No   OB History    No data  available     Review of Systems  All other systems reviewed and are negative.     Allergies  Review of patient's allergies indicates no known allergies.  Home Medications   Prior to Admission medications   Medication Sig Start Date End Date Taking? Authorizing Provider  albuterol (PROVENTIL HFA;VENTOLIN HFA) 108 (90 Base) MCG/ACT inhaler Inhale 2 puffs into the lungs every 6 (six) hours as needed for wheezing or shortness of breath.   Yes Historical Provider, MD  aspirin-acetaminophen-caffeine (EXCEDRIN MIGRAINE) [phone] MG per tablet Take 2 tablets by mouth every 6 (six) hours as needed for migraine.  02/07/15  Yes Historical Provider, MD  clonazePAM (KLONOPIN) 0.5 MG tablet TAKE 1 TABLET BY MOUTH TWICE DAILY 06/06/15  Yes Historical Provider, MD  clopidogrel (PLAVIX) 75 MG tablet Take 75 mg by mouth daily.   Yes Historical Provider, MD  cyclobenzaprine (FLEXERIL) 5 MG tablet Take 1 tablet (5 mg total) by mouth 2 (two) times daily. 1 tab twice daily x 3 days then increase to 2 tab twice daily if tolerated Patient taking differently: Take 10 mg by mouth 3 (three) times daily.  07/19/15  Yes Garvin Fila, MD  cycloSPORINE (RESTASIS) 0.05 % ophthalmic emulsion Place 1 drop into both eyes 2 (two) times daily.   Yes Historical Provider, MD  docusate sodium (COLACE) 100 MG capsule Take 100 mg by mouth daily as needed (constipation).  Yes Historical Provider, MD  fenofibrate (TRICOR) 145 MG tablet Take 145 mg by mouth at bedtime.    Yes Historical Provider, MD  fluticasone (FLONASE) 50 MCG/ACT nasal spray Place 2 sprays into both nostrils daily as needed for allergies or rhinitis.   Yes Historical Provider, MD  furosemide (LASIX) 20 MG tablet Take 20 mg by mouth daily as needed (ankle/feet swelling).  01/14/15  Yes Historical Provider, MD  Melatonin 5 MG TABS Take 5 mg by mouth at bedtime.    Yes Historical Provider, MD  meloxicam (MOBIC) 15 MG tablet Take 15 mg by mouth daily as needed  (back pain).  01/31/15  Yes Historical Provider, MD  Multiple Vitamin (MULTIVITAMIN WITH MINERALS) TABS tablet Take 1 tablet by mouth daily.   Yes Historical Provider, MD  pantoprazole (PROTONIX) 40 MG tablet Take 40 mg by mouth daily.  02/01/15  Yes Historical Provider, MD  promethazine (PHENERGAN) 25 MG tablet Take 1 tablet (25 mg total) by mouth every 6 (six) hours as needed for nausea or vomiting. Patient taking differently: Take 25 mg by mouth every 6 (six) hours as needed (migraine headaches). Take with tramadol 07/11/15  Yes Garvin Fila, MD  simvastatin (ZOCOR) 40 MG tablet Take 40 mg by mouth at bedtime.   Yes Historical Provider, MD  sodium chloride (OCEAN) 0.65 % SOLN nasal spray Place 2 sprays into both nostrils 3 (three) times daily as needed for congestion.    Yes Historical Provider, MD  tiotropium (SPIRIVA) 18 MCG inhalation capsule Place 18 mcg into inhaler and inhale daily.    Yes Historical Provider, MD  traMADol (ULTRAM) 50 MG tablet Take 1-2 tablets (50-100 mg total) by mouth every 6 (six) hours as needed (for headaches). Patient taking differently: Take 50 mg by mouth every 6 (six) hours as needed (migraine headaches). Take with phenergan 11/16/14  Yes Garvin Fila, MD  traZODone (DESYREL) 150 MG tablet Take 300 mg by mouth at bedtime.    Yes Historical Provider, MD  venlafaxine XR (EFFEXOR-XR) 150 MG 24 hr capsule Take 150 mg by mouth daily.   Yes Historical Provider, MD  amoxicillin-clavulanate (AUGMENTIN) 875-125 MG tablet Take 1 tablet by mouth 2 (two) times daily. Reported on 11/10/2015 10/31/15   Historical Provider, MD   BP 148/69 mmHg  Pulse 80  Temp(Src) 98.1 F (36.7 C) (Oral)  Resp 25  SpO2 94% Physical Exam  Constitutional: She is oriented to person, place, and time. She appears well-developed and well-nourished.  Morbidly obese  HENT:  Head: Normocephalic and atraumatic.  Right Ear: External ear normal.  Left Ear: External ear normal.  Nose: Nose normal.   Mouth/Throat: Oropharynx is clear and moist.  Mild left facial droop but appears to move all  facial muscles well and equally  Eyes: Conjunctivae and EOM are normal. Pupils are equal, round, and reactive to light.  Neck: Normal range of motion. Neck supple.  Cardiovascular: Normal rate, regular rhythm, normal heart sounds and intact distal pulses.   Pulmonary/Chest: Effort normal. She has wheezes.  Abdominal: Soft. Bowel sounds are normal.  Musculoskeletal: Normal range of motion.  Neurological: She is alert and oriented to person, place, and time. She has normal reflexes. She displays normal reflexes. She exhibits normal muscle tone. Coordination normal.  Left palmar drift, left leg able to lift and hold against gravity but still appears weaker than right  Skin: Skin is warm and dry.  Psychiatric: She has a normal mood and affect. Her behavior is normal.  Judgment and thought content normal.  Nursing note and vitals reviewed.   ED Course  Procedures (including critical care time) Labs Review Labs Reviewed  CBC - Abnormal; Notable for the following:    RBC 5.39 (*)    Hemoglobin 15.4 (*)    HCT 46.7 (*)    All other components within normal limits  COMPREHENSIVE METABOLIC PANEL - Abnormal; Notable for the following:    Total Protein 6.4 (*)    Albumin 3.4 (*)    Alkaline Phosphatase 25 (*)    All other components within normal limits  I-STAT CHEM 8, ED - Abnormal; Notable for the following:    Hemoglobin 17.3 (*)    HCT 51.0 (*)    All other components within normal limits  PROTIME-INR  APTT  DIFFERENTIAL  URINE RAPID DRUG SCREEN, HOSP PERFORMED  URINALYSIS, ROUTINE W REFLEX MICROSCOPIC (NOT AT [facility])  ETHANOL  I-STAT TROPOININ, ED  CBG MONITORING, ED  I-STAT ARTERIAL BLOOD GAS, ED    Imaging Review Dg Chest 2 View  11/10/2015  CLINICAL DATA:  Acute onset of chest congestion and left-sided weakness. Initial encounter. EXAM: CHEST  2 VIEW COMPARISON:  Chest radiograph  performed 01/05/2013 FINDINGS: The lungs are well-aerated. Mild vascular congestion is noted. Peribronchial thickening is seen. Mild bibasilar atelectasis is noted. There is no evidence of pleural effusion or pneumothorax. The heart is normal in size; the mediastinal contour is within normal limits. No acute osseous abnormalities are seen. Clips are noted within the right upper quadrant, reflecting prior cholecystectomy. IMPRESSION: Mild vascular congestion noted. Peribronchial thickening seen. Mild bibasilar atelectasis noted. Electronically Signed   By: Garald Balding M.D.   On: 11/10/2015 20:36   Ct Head Wo Contrast  11/10/2015  CLINICAL DATA:  Increased left-sided weakness for 2 days, headache, history stroke, hyperlipidemia, COPD, coronary artery disease, smoker EXAM: CT HEAD WITHOUT CONTRAST TECHNIQUE: Contiguous axial images were obtained from the base of the skull through the vertex without intravenous contrast. COMPARISON:  02/27/2015 FINDINGS: Mild generalized atrophy. Normal ventricular morphology. No midline shift or mass effect. Old RIGHT parietal watershed infarct, unchanged. No intracranial hemorrhage, mass lesion, or evidence acute infarction. No extra-axial fluid collections. Mild mucosal thickening RIGHT maxillary sinus. Bones and sinuses otherwise unremarkable. IMPRESSION: Old RIGHT parietal infarct. No acute intracranial abnormalities. Electronically Signed   By: Lavonia Dana M.D.   On: 11/10/2015 20:18   I have personally reviewed and evaluated these images and lab results as part of my medical decision-making.   EKG Interpretation   Date/Time:  Saturday November 10 2015 21:39:19 EST Ventricular Rate:  85 PR Interval:  176 QRS Duration: 109 QT Interval:  411 QTC Calculation: 489 R Axis:   77 Text Interpretation:  Sinus rhythm Low voltage, precordial leads  Borderline repolarization abnormality Borderline prolonged QT interval  Baseline wander in lead(s) I III aVL aVF V6  Confirmed by Azie Mcconahy MD, Andee Poles  [id number]) on 11/10/2015 9:52:22 PM      MDM   Final diagnoses:  Weakness  Confusion  COPD exacerbation ([place])    60 year old female with history of multiple strokes presents today with increased confusion and weakness. Patient with no acute changes but old parietal infarct is noted on right. I have discussed the patient's care with neuro hospitalist on call, Dr. Janann Colonel. He advises admission for further evaluation given patient's history of strokes and severe vascular disease  Discussed with Dr. Myna Hidalgo and plan admission to med surg bed.   Pattricia Boss, MD 11/10/15 2154  Pattricia Boss, MD 11/10/15 2155

## 2015-11-10 NOTE — ED Notes (Signed)
Patient transported to MRI 

## 2015-11-10 NOTE — ED Notes (Signed)
Neuro checks not performed while patient in the waiting room

## 2015-11-11 DIAGNOSIS — M6289 Other specified disorders of muscle: Secondary | ICD-10-CM | POA: Diagnosis not present

## 2015-11-11 DIAGNOSIS — R42 Dizziness and giddiness: Secondary | ICD-10-CM | POA: Diagnosis not present

## 2015-11-11 DIAGNOSIS — R410 Disorientation, unspecified: Secondary | ICD-10-CM | POA: Diagnosis not present

## 2015-11-11 DIAGNOSIS — R531 Weakness: Secondary | ICD-10-CM | POA: Insufficient documentation

## 2015-11-11 LAB — CBC WITH DIFFERENTIAL/PLATELET
BASOS ABS: 0 10*3/uL (ref 0.0–0.1)
Basophils Relative: 0 %
EOS ABS: 0.2 10*3/uL (ref 0.0–0.7)
Eosinophils Relative: 2 %
HCT: 44.5 % (ref 36.0–46.0)
HEMOGLOBIN: 14.1 g/dL (ref 12.0–15.0)
LYMPHS ABS: 2.5 10*3/uL (ref 0.7–4.0)
LYMPHS PCT: 38 %
MCH: 27.6 pg (ref 26.0–34.0)
MCHC: 31.7 g/dL (ref 30.0–36.0)
MCV: 87.1 fL (ref 78.0–100.0)
Monocytes Absolute: 0.4 10*3/uL (ref 0.1–1.0)
Monocytes Relative: 6 %
NEUTROS PCT: 54 %
Neutro Abs: 3.5 10*3/uL (ref 1.7–7.7)
Platelets: 177 10*3/uL (ref 150–400)
RBC: 5.11 MIL/uL (ref 3.87–5.11)
RDW: 14.8 % (ref 11.5–15.5)
WBC: 6.5 10*3/uL (ref 4.0–10.5)

## 2015-11-11 LAB — LIPID PANEL
Cholesterol: 98 mg/dL (ref 0–200)
HDL: 35 mg/dL — ABNORMAL LOW (ref >40)
LDL CALC: 41 mg/dL (ref 0–99)
Total CHOL/HDL Ratio: 2.8 RATIO
Triglycerides: 108 mg/dL (ref <150)
VLDL: 22 mg/dL (ref 0–40)

## 2015-11-11 MED ORDER — NICOTINE 21 MG/24HR TD PT24
21.0000 mg | MEDICATED_PATCH | Freq: Every day | TRANSDERMAL | Status: DC
  Administered 2015-11-11 – 2015-11-12 (×2): 21 mg via TRANSDERMAL
  Filled 2015-11-11 (×2): qty 1

## 2015-11-11 MED ORDER — SODIUM CHLORIDE 0.9 % IV SOLN
INTRAVENOUS | Status: DC
  Administered 2015-11-11 (×2): via INTRAVENOUS

## 2015-11-11 MED ORDER — TRAMADOL HCL 50 MG PO TABS
50.0000 mg | ORAL_TABLET | Freq: Four times a day (QID) | ORAL | Status: DC | PRN
Start: 2015-11-11 — End: 2015-11-12
  Administered 2015-11-12: 50 mg via ORAL
  Filled 2015-11-11: qty 1

## 2015-11-11 NOTE — ED Notes (Signed)
Due to low SpO2 pt placed on 2L [place]

## 2015-11-11 NOTE — ED Notes (Signed)
Pt ambulatory w/ steady gait to restroom. 

## 2015-11-11 NOTE — ED Notes (Signed)
hospitalist at bedside

## 2015-11-11 NOTE — ED Notes (Signed)
Pt being transported to 5W.

## 2015-11-11 NOTE — ED Notes (Signed)
Patient requested to go to the restroom.  This RN accompanied, gait was steady and even.  Pt had urinated in bed.  States this is "normal" for her.  Linens changed and new warm blankets applied.  Patient back to sleep quickly.

## 2015-11-11 NOTE — Progress Notes (Signed)
Pt admitted to the unit at 1332. Pt mental status is A&Ox4. Pt oriented to room, staff, and call bell. Skin is intact. Full assessment charted in [facility]. Call bell within reach. Visitor guidelines reviewed w/ pt and/or family.

## 2015-11-11 NOTE — ED Notes (Signed)
Patient resting soundly.  Arouses easily to stimuli.

## 2015-11-11 NOTE — Progress Notes (Signed)
April [name] [id number] DOB: [birth year] DOA: 11/10/2015 PCP: Woody Seller, MD  Brief narrative: 60 y/o ? 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 Family noticed left-sided facial droop. EMS was called and stroke alert was initiated Neurology saw the patient in consult  Past medical history-As per Problem list Chart reviewed as below-   Consultants:  neurology  Procedures:  MRI brain  Antibiotics:  Recently used Abx for URI   Subjective   Somewhat dizzy but no chest pain nor headach   Objective    Interim History:   Telemetry: Sinus   Objective: Filed Vitals:   11/11/15 0530 11/11/15 0615 11/11/15 0700 11/11/15 0715  BP: 95/49 90/54 108/59 122/56  Pulse: 73 72 78 79  Temp:      TempSrc:      Resp: 21 23 20 22   SpO2: 93% 91% 87% 93%    Intake/Output Summary (Last 24 hours) at 11/11/15 0737 Last data filed at 11/10/15 2059  Gross per 24 hour  Intake      0 ml  Output     30 ml  Net    -30 ml    Exam:  General: ALERT PLEASANT IN NAD Cardiovascular: S1 S W2NO M/R/G Respiratory: CLEAR NO ADDED SOUND Abdomen:  SOFT NT ND NO REBOUND NO GAURD Skin MILD LE EDEMA Neuro INTACT MOVING 4 LIMBS 5/5 POWER ALTHOUGH SOME SLIOGHT DIFFERECNE ON THE L SIDE, WITH WEAKNESS IN DORSIFLEXION  Data Reviewed: Basic Metabolic Panel:  Recent Labs Lab 11/10/15 1610 11/10/15 1628  NA 142 141  K 4.2 3.9  CL 105 103  CO2 27  --   GLUCOSE 95 88  BUN 11 14  CREATININE 0.94 0.90  CALCIUM 9.1  --    Liver Function Tests:  Recent Labs Lab 11/10/15 1610  AST 17  ALT 18  ALKPHOS 25*  BILITOT 0.5  PROT 6.4*  ALBUMIN 3.4*   No results for input(s): LIPASE, AMYLASE in the last 168 hours. No results  for input(s): AMMONIA in the last 168 hours. CBC:  Recent Labs Lab 11/10/15 1610 11/10/15 1628  WBC 8.4  --   NEUTROABS 4.6  --   HGB 15.4* 17.3*  HCT 46.7* 51.0*  MCV 86.6  --   PLT 193  --    Cardiac Enzymes: No results for input(s): CKTOTAL, CKMB, CKMBINDEX, TROPONINI in the last 168 hours. BNP: Invalid input(s): POCBNP CBG: No results for input(s): GLUCAP in the last 168 hours.  No results found for this or any previous visit (from the past 240 hour(s)).   Studies:              All Imaging reviewed and is as per above notation   Scheduled Meds: .  stroke: mapping our early stages of recovery book   Does not apply Once  . clonazePAM  0.5 mg Oral BID  . clopidogrel  75 mg Oral Daily  . cycloSPORINE  1 drop Both Eyes BID  . enoxaparin (LOVENOX) injection  40 mg Subcutaneous QHS  . fenofibrate  160 mg Oral Daily  . multivitamin with minerals  1 tablet Oral Daily  . pantoprazole  40 mg Oral Daily  . simvastatin  40 mg Oral QHS  . tiotropium  18 mcg Inhalation Daily  .  traZODone  300 mg Oral QHS  . venlafaxine XR  150 mg Oral Daily  . zolpidem  5 mg Oral QHS   Continuous Infusions: . sodium chloride 75 mL/hr at 11/10/15 2255     Assessment/Plan:  1. 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  Would Obs on telemetry overnight, continue IV saline 75 cc an hour. MRI does not show further stroke however it would be reasonable to assume that patient needs to keep up with fluids-Patient is also on IV Lasix which has been discontinued. Would also hold tramadol for now--would not complete rest of stroke workup as symptoms are likely secondary to hypotension + orthostasis 2. Prior migraines-stable 3. GERD continue Protonix 40 mg daily 4. Bipolar-continue Klonopin 0.5 twice a day, trazodone 300 daily at bedtime, Effexor XR 150 daily 5. Hyperlipidemia continue  simvastatin 40 daily, fenofibrate 160 daily 6. Chronic pain-continue Flexeril 5 twice a day   Observe on telemetry  Verneita Griffes, MD  [facility] Pager [phone] 11/11/2015, 7:37 AM    LOS: 1 day

## 2015-11-11 NOTE — ED Notes (Signed)
Admitting MD in w pt

## 2015-11-11 NOTE — ED Notes (Signed)
Flexeril given as requested for back pain. States hurts d/t stretcher - rail to stretcher lowered as pt requested - states feels better sitting up.

## 2015-11-11 NOTE — Progress Notes (Addendum)
STROKE TEAM PROGRESS NOTE   HISTORY AS DOCUMENTED DURING INITIAL NEURO ASSESSMENT April [name] is an 60 y.o. female hx of multiple strokes presenting with 2 day history of increased left-sided weakness and confusion. LSW > 2 days ago. Sister reports talking to patient on Thursday and states she did not seem like herself at that time. Patient reports feeling confused and notes that her LUE weakness is worse compared to baseline. Notes increased difficulty walking. Patient recently treated for respiratory infection, reports just finishing course of antibiotics, continues to note coughing and congestion. Still smokes.  CT head imaging reviewed, shows prior right parietal infarct. Lab work unremarkable, UA pending.   Followed by Dr Leonie Man. CT angio head and neck from 02/2015 show occlusion of the right ICA, proximal left ICA occlusion and patent bilateral vertebral stents. Takes Plavix 75mg  daily at home.   Date last known well: 11/08/2015 Time last known well: unclear tPA Given: no, outside tPA window Modified Rankin: Rankin Score=1   SUBJECTIVE (INTERVAL HISTORY) Her family was not at the bedside.  Overall she feels her condition is gradually improving. She reports that she gets light-headed when sitting up.  During orthostatic evaluation the following was found:  Supine  133/72  77 Sitting   107/89  80 Standing  112/89  84   OBJECTIVE Temp:  [98.1 F (36.7 C)-99.1 F (37.3 C)] 99.1 F (37.3 C) (01/14 2133) Pulse Rate:  [72-86] 79 (01/15 0715) Cardiac Rhythm:  [-]  Resp:  [16-28] 22 (01/15 0715) BP: (90-148)/(33-79) 122/56 mmHg (01/15 0715) SpO2:  [87 %-98 %] 93 % (01/15 0715)  CBC:  Recent Labs Lab 11/10/15 1610 11/10/15 1628  WBC 8.4  --   NEUTROABS 4.6  --   HGB 15.4* 17.3*  HCT 46.7* 51.0*  MCV 86.6  --   PLT 193  --     Basic Metabolic Panel:  Recent Labs Lab 11/10/15 1610 11/10/15 1628  NA 142 141  K 4.2 3.9  CL 105 103  CO2 27  --   GLUCOSE 95 88  BUN 11 14   CREATININE 0.94 0.90  CALCIUM 9.1  --     Lipid Panel:    Component Value Date/Time   CHOL 98 11/11/2015 0418   TRIG 108 11/11/2015 0418   HDL 35* 11/11/2015 0418   CHOLHDL 2.8 11/11/2015 0418   VLDL 22 11/11/2015 0418   LDLCALC 41 11/11/2015 0418   HgbA1c:  Lab Results  Component Value Date   HGBA1C  12/05/2009    5.6 (NOTE) The ADA recommends the following therapeutic goal for glycemic control related to Hgb A1c measurement: Goal of therapy: <6.5 Hgb A1c  Reference: American Diabetes Association: Clinical Practice Recommendations 2010, Diabetes Care, 2010, 33: (Suppl  1).   Urine Drug Screen:    Component Value Date/Time   LABOPIA NONE DETECTED 11/10/2015 2055   COCAINSCRNUR NONE DETECTED 11/10/2015 2055   LABBENZ NONE DETECTED 11/10/2015 2055   AMPHETMU NONE DETECTED 11/10/2015 2055   THCU NONE DETECTED 11/10/2015 2055   LABBARB NONE DETECTED 11/10/2015 2055      IMAGING  Dg Chest 2 View 11/10/2015   Mild vascular congestion noted. Peribronchial thickening seen. Mild bibasilar atelectasis noted.   Ct Head Wo Contrast 11/10/2015   Old RIGHT parietal infarct. No acute intracranial abnormalities.   Mr Jodene Nam Head/brain Wo Cm 11/11/2015    MRI HEAD:  No acute intracranial process, specifically no acute ischemia. Old RIGHT MCA territory infarct. Mild chronic small vessel ischemic disease.  MRA HEAD:  Slow flow versus occluded RIGHT internal carotid artery. Thready slow flow within LEFT internal carotid artery. Findings consistent with known ICA occlusion, as documented on CTA head and neck Feb 27, 2015. No acute large vessel occlusion.  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   PHYSICAL EXAM Physical Exam  Constitutional: He appears well-developed and well-nourished.  Psych: Affect appropriate to situation Eyes: No scleral injection HENT: No OP obstrucion Head: Normocephalic.  Cardiovascular: Normal rate and  regular rhythm.  Respiratory: Effort normal and breath sounds normal.  GI: Soft. Bowel sounds are normal. No distension. There is no tenderness.  Skin: WDI  Neurologic Examination: Mental Status: Alert, oriented, thought content appropriate. Speech fluent without evidence of aphasia. Able to follow 3 step commands without difficulty.  Cranial Nerves: II: visual fields grossly normal, pupils equal, round, reactive to light and accommodation III,IV, VI: ptosis not present, extra-ocular motions intact bilaterally V,VII: minimal left-sided facial weakness, facial light touch sensation diminished on the left VIII: hearing normal bilaterally IX,X: gag reflex present XI: trapezius strength/neck flexion strength normal bilaterally XII: tongue strength normal   Motor: exaggerated pronator type drift on the LUE Right :Upper extremityLeft: Upper extremity 5/5 deltoid 4+/5 deltoid 5/5 biceps5-/5 biceps  5/5 triceps5-/5 triceps 5/5 hand grip5-/5 hand grip Lower extremityLower extremity 5-/5 hip flexor5-/5 hip flexor 5-/5 quadricep5-/5 quadriceps  5-/5 hamstrings5-/5 hamstrings 5/5 plantar flexion 5/5 plantar flexion 5/5 plantar extension5/5 plantar extension Tone and bulk:normal tone throughout; no atrophy noted  Sensory: light  touch intact on right and slightly decreased on left  Cerebellar: normal finger-to-nose   Gait: deferred due to orthostasis  ASSESSMENT/PLAN [name] is a 60 y.o. female followed by Dr. 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 She did not receive IV t-PA due to a presentation and minimal deficits.  Possible TIA:  Dominant felt secondary to orthostatic hypotension.   Resultant  improvement in deficits   MRI   no acute infarct.  MRA  diffuse disease as described above.  Carotid Doppler -  pending   2D Echo -   LDL -  41   HgbA1c pending  VTE prophylaxis - Lovenox  Diet regular Room service appropriate?: Yes; Fluid consistency:: Thin  clopidogrel 75 mg daily prior to admission, now on clopidogrel 75 mg daily  Patient counseled to be compliant with her antithrombotic medications  Ongoing aggressive stroke risk factor management  Therapy recommendations:  pending   Disposition:  pending  Hypertension  Orthostatic hypotension noted in the emergency department with no significant change in heart rate.  Hyperlipidemia  Home meds:  Zocor 40 mg daily and [employer]  resumed in hospital  LDL 41, goal < 70  Continue statin at discharge  Other Stroke Risk Factors  Cigarette smoker, advised to stop smoking  Obesity, There is no weight on file to calculate BMI.   Hx stroke/TIA  Coronary artery disease  Other Active Problems  Orthostatic hypotension  - IV fluid hydration   Hospital day # 1  Lowry Ram [facility] Pager [phone] 11/11/2015, 7:47 AM   ATTENDING NOTE: Patient was seen and examined by me personally.  Documentation reflects findings. The laboratory and radiographic studies reviewed by me. ROS  completed by me personally and pertinent positives fully documented  Condition:  Orthostatic  Assessment and plan completed by me personally and fully documented above. Plans/Recommendations include:     Primary team to admit for evaluation/treatment of orthostasis.  Of interest, HR did not change as much as one might anticipate given the large change in BP  Jobst/TED hose  Education regarding appropriate hydration strategies (i.e. Patient reports drinking a lot of caffeine)  Will contact Dr. Leonie Man in the AM and update him on patient's admission. Based on O/P notes, he may wish to have patient undergo repeat angiography while here  Patient has now undergone two courses of treatment for URI (z-pak and 10 day course of Augmentin.)  She reports still having symptoms.  Recommend eval and treat if needed as this may play a role in patient's ability to feel well enough to stay hydrated  Checking TSH given heat/cold intolerance  SIGNED BY: Dr. Elissa Hefty      To contact Stroke Continuity provider, please refer to [email or web address] After hours, contact General Neurology

## 2015-11-11 NOTE — ED Notes (Signed)
Attempted to call report. Secretary advised RN will call back. 

## 2015-11-12 DIAGNOSIS — M6289 Other specified disorders of muscle: Secondary | ICD-10-CM | POA: Diagnosis not present

## 2015-11-12 LAB — BASIC METABOLIC PANEL
ANION GAP: 5 (ref 5–15)
BUN: 14 mg/dL (ref 6–20)
CHLORIDE: 108 mmol/L (ref 101–111)
CO2: 28 mmol/L (ref 22–32)
Calcium: 8.5 mg/dL — ABNORMAL LOW (ref 8.9–10.3)
Creatinine, Ser: 1.03 mg/dL — ABNORMAL HIGH (ref 0.44–1.00)
GFR calc Af Amer: >60 mL/min (ref >60)
GFR, EST NON AFRICAN AMERICAN: 59 mL/min — AB (ref >60)
GLUCOSE: 109 mg/dL — AB (ref 65–99)
POTASSIUM: 3.6 mmol/L (ref 3.5–5.1)
Sodium: 141 mmol/L (ref 135–145)

## 2015-11-12 LAB — HEMOGLOBIN A1C
Hgb A1c MFr Bld: 5.9 % — ABNORMAL HIGH (ref 4.8–5.6)
MEAN PLASMA GLUCOSE: 123 mg/dL

## 2015-11-12 LAB — VITAMIN B12: Vitamin B-12: 348 pg/mL (ref 180–914)

## 2015-11-12 LAB — TSH: TSH: 4.751 u[IU]/mL — AB (ref 0.350–4.500)

## 2015-11-12 MED ORDER — CYCLOBENZAPRINE HCL 10 MG PO TABS: 10.0000 mg | ORAL_TABLET | Freq: Three times a day (TID) | ORAL | Status: DC | PRN

## 2015-11-12 MED ORDER — ACETAMINOPHEN 325 MG PO TABS
650.0000 mg | ORAL_TABLET | Freq: Four times a day (QID) | ORAL | Status: DC | PRN
  Administered 2015-11-12: 650 mg via ORAL
  Filled 2015-11-12: qty 2

## 2015-11-12 NOTE — Discharge Summary (Signed)
Physician Discharge Summary  April [name] [id number] DOB: [birth year] DOA: 11/10/2015  PCP: Woody Seller, MD  Admit date: 11/10/2015 Discharge date: 11/12/2015  Time spent: greater than 30 minutes  Recommendations for Outpatient Follow-up:  1. Keep hydrated   Discharge Diagnoses:  Principal Problem:   Left-sided weakness Active Problems:   COPD (chronic obstructive pulmonary disease) (HCC)   Polycythemia   Chronic diastolic CHF (congestive heart failure) (HCC)   Respiratory acidosis   Discharge Condition:stable  Diet recommendation: heart healthy  Filed Weights   11/11/15 1341  Weight: 105.144 kg (231 lb 12.8 oz)    History of present illness:  60 y.o. female followed by Dr. 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 MRI without acute infarct. Noted to be orthostatic. Neurology consulted and felt symptoms related to dehydration, orthostatic hypotension in the setting of diffuse cerebrovascular disease. Symptoms resolved after a period of hydration  Procedures:  none  Consultations:  neurology  Discharge Exam: Filed Vitals:   11/12/15 0019 11/12/15 0433  BP: 136/71 106/63  Pulse: 80 89  Temp: 98.2 F (36.8 C) 98.2 F (36.8 C)  Resp: 18 16    General: a and o Cardiovascular: RRR Respiratory: CTA Neuro: nonfocal  Discharge Instructions   Discharge Instructions    Diet general    Complete by:  As directed      Increase activity slowly    Complete by:  As directed           Current Discharge Medication List    CONTINUE these medications which have CHANGED   Details  cyclobenzaprine (FLEXERIL) 10  MG tablet Take 1 tablet (10 mg total) by mouth 3 (three) times daily as needed for muscle spasms. 1 tab twice daily x 3 days then increase to 2 tab twice daily if tolerated Qty: 20 tablet, Refills: 0      CONTINUE these medications which have NOT CHANGED   Details  albuterol (PROVENTIL HFA;VENTOLIN HFA) 108 (90 Base) MCG/ACT inhaler Inhale 2 puffs into the lungs every 6 (six) hours as needed for wheezing or shortness of breath.    aspirin-acetaminophen-caffeine (EXCEDRIN MIGRAINE) 250-250-65 MG per tablet Take 2 tablets by mouth every 6 (six) hours as needed for migraine.     clonazePAM (KLONOPIN) 0.5 MG tablet TAKE 1 TABLET BY MOUTH TWICE DAILY Refills: 5    clopidogrel (PLAVIX) 75 MG tablet Take 75 mg by mouth daily.    cycloSPORINE (RESTASIS) 0.05 % ophthalmic emulsion Place 1 drop into both eyes 2 (two) times daily.    docusate sodium (COLACE) 100 MG capsule Take 100 mg by mouth daily as needed (constipation).     fenofibrate (TRICOR) 145 MG tablet Take 145 mg by mouth at bedtime.     fluticasone (FLONASE) 50 MCG/ACT nasal spray Place 2 sprays into both nostrils daily as needed for allergies or rhinitis.    furosemide (LASIX) 20 MG tablet Take 20 mg by mouth daily as needed (ankle/feet swelling).     Melatonin 5 MG TABS Take 5 mg by mouth at bedtime.     meloxicam (MOBIC) 15 MG tablet Take 15 mg by mouth daily as needed (back pain).  Refills: 2    Multiple Vitamin (MULTIVITAMIN WITH MINERALS) TABS tablet Take 1  tablet by mouth daily.    pantoprazole (PROTONIX) 40 MG tablet Take 40 mg by mouth daily.     promethazine (PHENERGAN) 25 MG tablet Take 1 tablet (25 mg total) by mouth every 6 (six) hours as needed for nausea or vomiting. Qty: 90 tablet, Refills: 1   Associated Diagnoses: Migraine without aura and without status migrainosus, not intractable    simvastatin (ZOCOR) 40 MG tablet Take 40 mg by mouth at bedtime.    sodium chloride (OCEAN) 0.65 % SOLN nasal spray Place 2  sprays into both nostrils 3 (three) times daily as needed for congestion.     tiotropium (SPIRIVA) 18 MCG inhalation capsule Place 18 mcg into inhaler and inhale daily.     traMADol (ULTRAM) 50 MG tablet Take 1-2 tablets (50-100 mg total) by mouth every 6 (six) hours as needed (for headaches). Qty: 90 tablet, Refills: 1    traZODone (DESYREL) 150 MG tablet Take 300 mg by mouth at bedtime.     venlafaxine XR (EFFEXOR-XR) 150 MG 24 hr capsule Take 150 mg by mouth daily.      STOP taking these medications     amoxicillin-clavulanate (AUGMENTIN) 875-125 MG tablet        No Known Allergies Follow-up Information    Follow up with SETHI,PRAMOD, MD. Go on 01/08/2016.   Specialties:  Neurology, Radiology   Why:  at [place] information:   [address] [place] [address] [phone]       Follow up with Woody Seller, MD.   Specialty:  Family Medicine   Why:  If symptoms worsen   Contact information:   [address] [place] [address] [phone]        The results of significant diagnostics from this hospitalization (including imaging, microbiology, ancillary and laboratory) are listed below for reference.    Significant Diagnostic Studies: Dg Chest 2 View  11/10/2015  CLINICAL DATA:  Acute onset of chest congestion and left-sided weakness. Initial encounter. EXAM: CHEST  2 VIEW COMPARISON:  Chest radiograph performed 01/05/2013 FINDINGS: The lungs are well-aerated. Mild vascular congestion is noted. Peribronchial thickening is seen. Mild bibasilar atelectasis is noted. There is no evidence of pleural effusion or pneumothorax. The heart is normal in size; the mediastinal contour is within normal limits. No acute osseous abnormalities are seen. Clips are noted within the right upper quadrant, reflecting prior cholecystectomy. IMPRESSION: Mild vascular congestion noted. Peribronchial thickening seen. Mild bibasilar atelectasis noted. Electronically  Signed   By: Garald Balding M.D.   On: 11/10/2015 20:36   Ct Head Wo Contrast  11/10/2015  CLINICAL DATA:  Increased left-sided weakness for 2 days, headache, history stroke, hyperlipidemia, COPD, coronary artery disease, smoker EXAM: CT HEAD WITHOUT CONTRAST TECHNIQUE: Contiguous axial images were obtained from the base of the skull through the vertex without intravenous contrast. COMPARISON:  02/27/2015 FINDINGS: Mild generalized atrophy. Normal ventricular morphology. No midline shift or mass effect. Old RIGHT parietal watershed infarct, unchanged. No intracranial hemorrhage, mass lesion, or evidence acute infarction. No extra-axial fluid collections. Mild mucosal thickening RIGHT maxillary sinus. Bones and sinuses otherwise unremarkable. IMPRESSION: Old RIGHT parietal infarct. No acute intracranial abnormalities. Electronically Signed   By: Lavonia Dana M.D.   On: 11/10/2015 20:18   Mr Brain Wo Contrast  11/11/2015  CLINICAL DATA:  Increasing LEFT-sided weakness and confusion. Recent respiratory tract infection. History of bilateral internal carotid artery occlusion, stroke, hyperlipidemia and headache. EXAM: MRI HEAD WITHOUT CONTRAST MRA HEAD WITHOUT  CONTRAST TECHNIQUE: Multiplanar, multiecho pulse sequences of the brain and surrounding structures were obtained without intravenous contrast. Angiographic images of the head were obtained using MRA technique without contrast. COMPARISON:  CT head November 09, 2014 at 2004 hours and MRI of the brain December 23, 2013 and CT angiogram of the head Feb 27, 2015 FINDINGS: MRI HEAD FINDINGS No reduced diffusion to suggest acute ischemia. No susceptibility artifact to suggest hemorrhage. Small area RIGHT occipital lobe encephalomalacia. Confluent RIGHT frontoparietal encephalomalacia with cystic changes. Mild ex vacuo dilatation RIGHT lateral ventricle, no hydrocephalus. Additional patchy supratentorial white matter T2 hyperintensities without midline shift, mass  effect or mass lesions. No abnormal extra-axial fluid collections. Ocular globes and orbital contents are unremarkable. RIGHT maxillary sinus mucosal thickening. Mastoid air cells are well aerated. No abnormal sellar expansion. No cerebellar tonsillar ectopia. No suspicious calvarial bone marrow signal. MRA HEAD FINDINGS Anterior circulation: No discernible flow related enhancement of the RIGHT cervical, petrous or cavernous segments. Minimal reconstitution of the RIGHT carotid terminus. Thready flow related enhancement of the LEFT cervical, petrous, cavernous and supraclinoid internal carotid artery. Patent though poor flow related enhancement of the anterior and middle cerebral arteries bilaterally. Posterior circulation: LEFT vertebral artery is dominant. Focal moderate stenosis RIGHT V4 segment. 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 Small bilateral posterior communicating arteries are present. Basilar artery is patent, with normal flow related enhancement of the main branch vessels. Normal flow related enhancement of the posterior cerebral arteries. No large vessel occlusion, high-grade stenosis, abnormal luminal irregularity. IMPRESSION: MRI HEAD: No acute intracranial process, specifically no acute ischemia. Old RIGHT MCA territory infarct. Mild chronic small vessel ischemic disease. MRA HEAD: Slow flow versus occluded RIGHT internal carotid artery. Thready slow flow within LEFT internal carotid artery. Findings consistent with known ICA occlusion, as documented on CTA head and neck Feb 27, 2015. No acute large vessel occlusion.  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 Electronically Signed   By: Elon Alas M.D.   On: 11/11/2015 00:55   Mr Jodene Nam Head/brain Wo Cm  11/11/2015  CLINICAL DATA:  Increasing LEFT-sided weakness and confusion. Recent respiratory tract infection.  History of bilateral internal carotid artery occlusion, stroke, hyperlipidemia and headache. EXAM: MRI HEAD WITHOUT CONTRAST MRA HEAD WITHOUT CONTRAST TECHNIQUE: Multiplanar, multiecho pulse sequences of the brain and surrounding structures were obtained without intravenous contrast. Angiographic images of the head were obtained using MRA technique without contrast. COMPARISON:  CT head November 09, 2014 at 2004 hours and MRI of the brain December 23, 2013 and CT angiogram of the head Feb 27, 2015 FINDINGS: MRI HEAD FINDINGS No reduced diffusion to suggest acute ischemia. No susceptibility artifact to suggest hemorrhage. Small area RIGHT occipital lobe encephalomalacia. Confluent RIGHT frontoparietal encephalomalacia with cystic changes. Mild ex vacuo dilatation RIGHT lateral ventricle, no hydrocephalus. Additional patchy supratentorial white matter T2 hyperintensities without midline shift, mass effect or mass lesions. No abnormal extra-axial fluid collections. Ocular globes and orbital contents are unremarkable. RIGHT maxillary sinus mucosal thickening. Mastoid air cells are well aerated. No abnormal sellar expansion. No cerebellar tonsillar ectopia. No suspicious calvarial bone marrow signal. MRA HEAD FINDINGS Anterior circulation: No discernible flow related enhancement of the RIGHT cervical, petrous or cavernous segments. Minimal reconstitution of the RIGHT carotid terminus. Thready flow related enhancement of the LEFT cervical, petrous, cavernous and supraclinoid internal carotid artery. Patent though poor flow related enhancement of the anterior and  middle cerebral arteries bilaterally. Posterior circulation: LEFT vertebral artery is dominant. Focal moderate stenosis RIGHT V4 segment. 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 Small bilateral posterior communicating arteries are present. Basilar artery is patent, with normal flow  related enhancement of the main branch vessels. Normal flow related enhancement of the posterior cerebral arteries. No large vessel occlusion, high-grade stenosis, abnormal luminal irregularity. IMPRESSION: MRI HEAD: No acute intracranial process, specifically no acute ischemia. Old RIGHT MCA territory infarct. Mild chronic small vessel ischemic disease. MRA HEAD: Slow flow versus occluded RIGHT internal carotid artery. Thready slow flow within LEFT internal carotid artery. Findings consistent with known ICA occlusion, as documented on CTA head and neck Feb 27, 2015. No acute large vessel occlusion.  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 Electronically Signed   By: Elon Alas M.D.   On: 11/11/2015 00:55    Microbiology: No results found for this or any previous visit (from the past 240 hour(s)).   Labs: Basic Metabolic Panel:  Recent Labs Lab 11/10/15 1610 11/10/15 1628 11/12/15 0559  NA 142 141 141  K 4.2 3.9 3.6  CL 105 103 108  CO2 27  --  28  GLUCOSE 95 88 109*  BUN 11 14 14   CREATININE 0.94 0.90 1.03*  CALCIUM 9.1  --  8.5*   Liver Function Tests:  Recent Labs Lab 11/10/15 1610  AST 17  ALT 18  ALKPHOS 25*  BILITOT 0.5  PROT 6.4*  ALBUMIN 3.4*   No results for input(s): LIPASE, AMYLASE in the last 168 hours. No results for input(s): AMMONIA in the last 168 hours. CBC:  Recent Labs Lab 11/10/15 1610 11/10/15 1628 11/11/15 1137  WBC 8.4  --  6.5  NEUTROABS 4.6  --  3.5  HGB 15.4* 17.3* 14.1  HCT 46.7* 51.0* 44.5  MCV 86.6  --  87.1  PLT 193  --  177   Cardiac Enzymes: No results for input(s): CKTOTAL, CKMB, CKMBINDEX, TROPONINI in the last 168 hours. BNP: BNP (last 3 results) No results for input(s): BNP in the last 8760 hours.  ProBNP (last 3 results) No results for input(s): PROBNP in the last 8760 hours.  CBG: No results for input(s): GLUCAP in the last 168  hours.     Signed:  Delfina Redwood MD.  [facility] 11/12/2015, 11:33 AM

## 2015-11-12 NOTE — Progress Notes (Signed)
NURSING PROGRESS NOTE  Gwynda Deutschman [id number] Discharge Data: 11/12/2015 1:01 PM Attending Provider: Delfina Redwood, MD [id number] Mallie Mussel, MD   Art Buff to be D/C'd Home per MD order.    All IV's will be discontinued and monitored for bleeding.  All belongings will be returned to patient for patient to take home.  Last Documented Vital Signs:  Blood pressure 106/63, pulse 89, temperature 98.2 F (36.8 C), temperature source Oral, resp. rate 16, height 5\' 6"  (1.676 m), weight 105.144 kg (231 lb 12.8 oz), SpO2 91 %.  Joslyn Hy, MSN, RN, [employer]

## 2015-11-12 NOTE — Evaluation (Addendum)
Physical Therapy Evaluation & discharge Patient Details Name: April [name] MRN: [id number] DOB: July 02, 1956 Today's Date: 11/12/2015   History of Present Illness  April [name] is a 60 y.o. 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   Clinical Impression  Pt admitted with above diagnosis.  Pt with congested cough throughout session and mentioned twice that she wanted to smoke a cigarette. Pt's orthostatics taken with no symptoms or complaints of dizziness. Pt able to ambulate without difficulty on level surfaces with head turns and obstacles.  Did note that pt tended to left UE down at side or hold across chest and lacked reciprocal arm swing.  She also tended to not use L UE when it would have been helpful, for example re-arranging items on tray to keep drink on edge from spilling.  Pt is at baseline level of functioning from PT standpoint and will d/c.     Follow Up Recommendations No PT follow up    Equipment Recommendations  None recommended by PT    Recommendations for Other Services       Precautions / Restrictions Precautions Precautions: None Restrictions Weight Bearing Restrictions: No      Mobility  Bed Mobility Overal bed mobility: Independent                Transfers Overall transfer level: Modified independent Equipment used: None                Ambulation/Gait Ambulation/Gait assistance: Supervision;Modified independent (Device/Increase time) Ambulation Distance (Feet): 350 Feet Assistive device: None Gait Pattern/deviations: Decreased step length - left   Gait velocity interpretation: at or above normal speed for age/gender General Gait Details: Pt refused use of gait belt. Pt able to ambulate with head turns and normal  cadence with PT. Decreased step length on L with some c/o back pain at end of gait, which pt states is due to bulgining disc. States she has had issues with L LE due to back but this is chronic. No foot drag seen with gait during session.  Stairs            Wheelchair Mobility    Modified Rankin (Stroke Patients Only) Modified Rankin (Stroke Patients Only) Pre-Morbid Rankin Score: No symptoms Modified Rankin: No significant disability     Balance Overall balance assessment: Needs assistance           Standing balance-Leahy Scale: Good                               Pertinent Vitals/Pain Pain Assessment: Faces Faces Pain Scale: Hurts a little bit Pain Location: low back after gait Pain Descriptors / Indicators: Aching Pain Intervention(s): Limited activity within patient's tolerance;Repositioned    Home Living Family/patient expects to be discharged to:: Private residence Living Arrangements: Alone Available Help at Discharge: Available PRN/intermittently;Friend(s) Type of Home: Apartment Home Access: Level entry;Elevator     Home Layout: One level Home Equipment: Grab bars - tub/shower;Grab bars - toilet Additional Comments: Apartment is near elevator    Prior Function Level of Independence: Independent               Hand Dominance   Dominant Hand: Right    Extremity/Trunk Assessment   Upper Extremity Assessment: Defer to OT evaluation (noted  in PT eval that pt tended to not use L UE when it would have been helpful (re-arranging items on tray, etc))           Lower Extremity Assessment: Overall WFL for tasks assessed;LLE deficits/detail   LLE Deficits / Details: strength WFL, but has tingling in it that she says is getting better.  Cervical / Trunk Assessment: Normal  Communication   Communication: No difficulties  Cognition Arousal/Alertness: Awake/alert Behavior During Therapy: WFL for tasks assessed/performed Overall  Cognitive Status: Within Functional Limits for tasks assessed                      General Comments General comments (skin integrity, edema, etc.): no LOB with head turns, changes in direction    Exercises        Assessment/Plan    PT Assessment Patent does not need any further PT services  PT Diagnosis Difficulty walking   PT Problem List    PT Treatment Interventions     PT Goals (Current goals can be found in the Care Plan section) Acute Rehab PT Goals Patient Stated Goal: To go home today PT Goal Formulation: All assessment and education complete, DC therapy    Frequency     Barriers to discharge        Co-evaluation               End of Session   Activity Tolerance: Patient tolerated treatment well Patient left: in chair;with call bell/phone within reach Nurse Communication: Mobility status         Time: [id number] PT Time Calculation (min) (ACUTE ONLY): 24 min   Charges:   PT Evaluation $PT Eval Moderate Complexity: 1 Procedure PT Treatments $Gait Training: 8-22 mins   PT G Codes:        Damaria Vachon LUBECK 11/12/2015, 10:28 AM

## 2015-11-12 NOTE — Care Management Note (Signed)
Case Management Note  Patient Details  Name: Mardine Arciaga MRN: [id number] Date of Birth: 1955/11/25  Subjective/Objective:                  Date- 11-12-15 Initial Assessment Spoke with patient at the bedside.  Introduced self as Tourist information centre manager and explained role in discharge planning and how to be reached.  Verified patient lives in  [place].  Verified patient anticipates to go home alone at time of discharge Patient has no DME. Expressed potential need for no other DME.  Patient denied needing help with their medication, but asked for assistance with incontinent supplies/ Patient provided with resource from [employer] to have supplies covered. Patient drives  to MD appointments.  Verified patient has PCP Redmond Pulling. Patient states they currently receive [place] services through no one.   Admission Comments:   Carles Collet RN BSN CM [phone]   Action/Plan:  Dc to home, self care.  Expected Discharge Date:                  Expected Discharge Plan:  Home/Self Care  In-House Referral:     Discharge planning Services  CM Consult  Post Acute Care Choice:    Choice offered to:     DME Arranged:    DME Agency:  [employer]  HH Arranged:    HH Agency:     Status of Service:  Completed, signed off  Medicare Important Message Given:    Date Medicare IM Given:    Medicare IM give by:    Date Additional Medicare IM Given:    Additional Medicare Important Message give by:     If discussed at [place] of Stay Meetings, dates discussed:    Additional Comments:  Carles Collet, RN 11/12/2015, 1:09 PM

## 2015-11-12 NOTE — Progress Notes (Signed)
STROKE TEAM PROGRESS NOTE   SUBJECTIVE (INTERVAL HISTORY) Patient sitting up in the chair. She is concerned because they are considering a beta blocker for her. She reports hx of recent abx use.    OBJECTIVE Temp:  [98 F (36.7 C)-98.7 F (37.1 C)] 98.2 F (36.8 C) (01/16 0433) Pulse Rate:  [78-89] 89 (01/16 0433) Cardiac Rhythm:  [-] Normal sinus rhythm (01/16 0700) Resp:  [16-26] 16 (01/16 0433) BP: (106-149)/(59-89) 106/63 mmHg (01/16 0433) SpO2:  [91 %-96 %] 91 % (01/16 0433) Weight:  [105.144 kg (231 lb 12.8 oz)] 105.144 kg (231 lb 12.8 oz) (01/15 1341)  CBC:   Recent Labs Lab 11/10/15 1610 11/10/15 1628 11/11/15 1137  WBC 8.4  --  6.5  NEUTROABS 4.6  --  3.5  HGB 15.4* 17.3* 14.1  HCT 46.7* 51.0* 44.5  MCV 86.6  --  87.1  PLT 193  --  123XX123    Basic Metabolic Panel:   Recent Labs Lab 11/10/15 1610 11/10/15 1628 11/12/15 0559  NA 142 141 141  K 4.2 3.9 3.6  CL 105 103 108  CO2 27  --  28  GLUCOSE 95 88 109*  BUN 11 14 14   CREATININE 0.94 0.90 1.03*  CALCIUM 9.1  --  8.5*    Lipid Panel:     Component Value Date/Time   CHOL 98 11/11/2015 0418   TRIG 108 11/11/2015 0418   HDL 35* 11/11/2015 0418   CHOLHDL 2.8 11/11/2015 0418   VLDL 22 11/11/2015 0418   LDLCALC 41 11/11/2015 0418   HgbA1c:  Lab Results  Component Value Date   HGBA1C  12/05/2009    5.6 (NOTE) The ADA recommends the following therapeutic goal for glycemic control related to Hgb A1c measurement: Goal of therapy: <6.5 Hgb A1c  Reference: American Diabetes Association: Clinical Practice Recommendations 2010, Diabetes Care, 2010, 33: (Suppl  1).   Urine Drug Screen:     Component Value Date/Time   LABOPIA NONE DETECTED 11/10/2015 2055   COCAINSCRNUR NONE DETECTED 11/10/2015 2055   LABBENZ NONE DETECTED 11/10/2015 2055   AMPHETMU NONE DETECTED 11/10/2015 2055   THCU NONE DETECTED 11/10/2015 2055   LABBARB NONE DETECTED 11/10/2015 2055      IMAGING  Dg Chest 2 View 11/10/2015    Mild vascular congestion noted. Peribronchial thickening seen. Mild bibasilar atelectasis noted.   Ct Head Wo Contrast 11/10/2015   Old RIGHT parietal infarct. No acute intracranial abnormalities.   MRI HEAD: 11/11/2015   No acute intracranial process, specifically no acute ischemia. Old RIGHT MCA territory infarct. Mild chronic small vessel ischemic disease.   MRA HEAD: 11/11/2015   Slow flow versus occluded RIGHT internal carotid artery. Thready slow flow within LEFT internal carotid artery. Findings consistent with known ICA occlusion, as documented on CTA head and neck Feb 27, 2015. No acute large vessel occlusion.  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    PHYSICAL EXAM Physical Exam  Constitutional: He appears well-developed and well-nourished.  Psych: Affect appropriate to situation Eyes: No scleral injection HENT: No OP obstrucion Head: Normocephalic.  Cardiovascular: Normal rate and regular rhythm.  Respiratory: Effort normal and breath sounds normal.  GI: Soft. Bowel sounds are normal. No distension. There is no tenderness.  Skin: WDI  Neurologic Examination: Mental Status: Alert, oriented, thought content appropriate. Speech fluent without evidence of aphasia. Able to follow 3 step commands without difficulty. Cranial Nerves: II: visual fields grossly normal, pupils equal, round,  reactive to light and accommodation III,IV, VI: ptosis not present, extra-ocular motions intact bilaterally V,VII: minimal left-sided facial weakness, facial light touch sensation diminished on the left VIII: hearing normal bilaterally IX,X: gag reflex present XI: trapezius strength/neck flexion strength normal bilaterally XII: tongue strength normal  Motor: exaggerated pronator type drift on the LUE Right :Upper extremityLeft: Upper extremity 5/5  deltoid 4+/5 deltoid 5/5 biceps5-/5 biceps  5/5 triceps5-/5 triceps 5/5 hand grip5-/5 hand grip Lower extremityLower extremity 5-/5 hip flexor5-/5 hip flexor 5-/5 quadricep5-/5 quadriceps  5-/5 hamstrings5-/5 hamstrings 5/5 plantar flexion 5/5 plantar flexion 5/5 plantar extension5/5 plantar extension Tone and bulk:normal tone throughout; no atrophy noted Sensory: light touch intact on right and slightly decreased on left Cerebellar: normal finger-to-nose  Gait: deferred due to orthostasis   ASSESSMENT/PLAN Ms. Juhi Tieu is a 60 y.o. female followed by Dr. 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 She did not receive IV t-PA due to a presentation and minimal deficits.  Transient neurologic symptoms in setting of orthostatic hypotension and dehydration with known large vessel occlusive disease  Resultant  improvement in deficits   MRI   no acute infarct.  MRA  diffuse disease as described above.  LDL -  41   HgbA1c pending   VTE prophylaxis - Lovenox Diet regular Room  service appropriate?: Yes; Fluid consistency:: Thin  clopidogrel 75 mg daily prior to admission, now on clopidogrel 75 mg daily  Patient counseled to be compliant with her antithrombotic medications  No further stroke workup or treatment indication  Therapy recommendations:  No PT needs  Disposition:  Return home  Keep appt already scheduled with DR. Sethi in March, 01/08/2016 at 1030. Added to followup  Orthostatic hypotension  Orthostatic hypotension noted in the emergency department with no significant change in heart rate.  Due to dehydration.  Rehydrated with IVF  No indication for antihypertensives from stroke standpoint  Patient advised to say hydrated  Hyperlipidemia  Home meds:  Zocor 40 mg daily and TriCor  resumed in hospital  LDL 41, goal < 70  Continue statin at discharge  Other Stroke Risk Factors  Cigarette smoker, advised to stop smoking  Obesity, Body mass index is 37.43 kg/(m^2).   Hx stroke/TIA  Coronary artery disease  Migraines   Other Active Problems  Heat/cold intolerance. TSH 4.751  GERD  Bipolar  Chronic pain on flexeril  NOTHING FURTHER TO ADD FROM THE STROKE STANDPOINT  Ongoing risk factor control by Primary Care Physician  Stroke Service will sign off. Please call should any needs arise.  Hospital day # [place] for Pager information 11/12/2015 10:23 AM  I have personally examined this patient, reviewed notes, independently viewed imaging studies, participated in medical decision making and plan of care. I have made any additions or clarifications directly to the above note. Agree with note above. She presented with transient increased left-sided weakness and confusion which has improved but this happened after a two-week period of her bronchitis been treated with antibiotics and patient not eating well and possibly having dehydration and orthostatic hypotension. She remains at risk  for neurological worsening, recurrent stroke, TIA given the significant occlusive extracranial disease. I do not believe further stroke evaluation is necessary. I counseled the  patient to maintain adequate hydration and to avoid hypotension. Continue Plavix for stroke prevention and follow-up as an outpatient  Antony Contras, MD Medical Director [facility] Pager: [phone] 11/12/2015 3:00 PM  To contact Stroke Continuity provider, please refer to [email or web address] After hours, contact General Neurology

## 2015-11-13 LAB — FOLATE RBC
FOLATE, HEMOLYSATE: 292.5 ng/mL
Folate, RBC: 710 ng/mL (ref >498)
Hematocrit: 41.2 % (ref 34.0–46.6)

## 2015-12-24 DIAGNOSIS — I10 Essential (primary) hypertension: Secondary | ICD-10-CM | POA: Insufficient documentation

## 2015-12-24 DIAGNOSIS — E039 Hypothyroidism, unspecified: Secondary | ICD-10-CM | POA: Insufficient documentation

## 2015-12-24 DIAGNOSIS — H903 Sensorineural hearing loss, bilateral: Secondary | ICD-10-CM | POA: Insufficient documentation

## 2015-12-24 DIAGNOSIS — F411 Generalized anxiety disorder: Secondary | ICD-10-CM | POA: Insufficient documentation

## 2015-12-24 DIAGNOSIS — K219 Gastro-esophageal reflux disease without esophagitis: Secondary | ICD-10-CM | POA: Diagnosis not present

## 2015-12-24 DIAGNOSIS — E7800 Pure hypercholesterolemia, unspecified: Secondary | ICD-10-CM | POA: Diagnosis not present

## 2015-12-24 DIAGNOSIS — Z8673 Personal history of transient ischemic attack (TIA), and cerebral infarction without residual deficits: Secondary | ICD-10-CM | POA: Insufficient documentation

## 2015-12-24 DIAGNOSIS — J449 Chronic obstructive pulmonary disease, unspecified: Secondary | ICD-10-CM | POA: Diagnosis not present

## 2015-12-24 DIAGNOSIS — F5104 Psychophysiologic insomnia: Secondary | ICD-10-CM | POA: Diagnosis not present

## 2015-12-24 DIAGNOSIS — G459 Transient cerebral ischemic attack, unspecified: Secondary | ICD-10-CM | POA: Diagnosis not present

## 2015-12-24 DIAGNOSIS — F324 Major depressive disorder, single episode, in partial remission: Secondary | ICD-10-CM | POA: Insufficient documentation

## 2015-12-24 DIAGNOSIS — R600 Localized edema: Secondary | ICD-10-CM | POA: Insufficient documentation

## 2016-01-08 ENCOUNTER — Ambulatory Visit: Payer: PPO | Admitting: Neurology

## 2016-01-09 ENCOUNTER — Ambulatory Visit: Payer: PPO | Admitting: Adult Health

## 2016-02-01 DIAGNOSIS — R3915 Urgency of urination: Secondary | ICD-10-CM | POA: Diagnosis not present

## 2016-02-01 DIAGNOSIS — B372 Candidiasis of skin and nail: Secondary | ICD-10-CM | POA: Diagnosis not present

## 2016-02-01 DIAGNOSIS — R32 Unspecified urinary incontinence: Secondary | ICD-10-CM | POA: Diagnosis not present

## 2016-03-10 ENCOUNTER — Telehealth: Payer: Self-pay | Admitting: Adult Health

## 2016-03-10 NOTE — Telephone Encounter (Signed)
April [name]   Physician SETHI  [id number] * DOB: [birth year]  WOULD LIKE TO SCHEDULE AN APPT   I called pt and left message to call back.

## 2016-04-02 ENCOUNTER — Other Ambulatory Visit: Payer: Self-pay | Admitting: Family Medicine

## 2016-04-02 DIAGNOSIS — Z1231 Encounter for screening mammogram for malignant neoplasm of breast: Secondary | ICD-10-CM

## 2016-04-02 DIAGNOSIS — Z0000 Encounter for general adult medical examination without abnormal findings: Secondary | ICD-10-CM | POA: Diagnosis not present

## 2016-04-02 DIAGNOSIS — R32 Unspecified urinary incontinence: Secondary | ICD-10-CM | POA: Diagnosis not present

## 2016-04-02 DIAGNOSIS — Z124 Encounter for screening for malignant neoplasm of cervix: Secondary | ICD-10-CM | POA: Diagnosis not present

## 2016-04-02 DIAGNOSIS — K219 Gastro-esophageal reflux disease without esophagitis: Secondary | ICD-10-CM | POA: Diagnosis not present

## 2016-04-02 DIAGNOSIS — H60501 Unspecified acute noninfective otitis externa, right ear: Secondary | ICD-10-CM | POA: Diagnosis not present

## 2016-04-04 ENCOUNTER — Ambulatory Visit (INDEPENDENT_AMBULATORY_CARE_PROVIDER_SITE_OTHER): Payer: PPO | Admitting: Neurology

## 2016-04-04 ENCOUNTER — Ambulatory Visit: Payer: Self-pay | Admitting: Neurology

## 2016-04-04 ENCOUNTER — Encounter: Payer: Self-pay | Admitting: Neurology

## 2016-04-04 VITALS — BP 147/88 | HR 89 | Ht 63.0 in | Wt 246.8 lb

## 2016-04-04 DIAGNOSIS — M5489 Other dorsalgia: Secondary | ICD-10-CM

## 2016-04-04 DIAGNOSIS — G43009 Migraine without aura, not intractable, without status migrainosus: Secondary | ICD-10-CM

## 2016-04-04 DIAGNOSIS — M549 Dorsalgia, unspecified: Secondary | ICD-10-CM | POA: Insufficient documentation

## 2016-04-04 MED ORDER — PROMETHAZINE HCL 25 MG PO TABS: 25.0000 mg | ORAL_TABLET | Freq: Four times a day (QID) | ORAL | Status: DC | PRN

## 2016-04-04 MED ORDER — TRAMADOL HCL 50 MG PO TABS: 50.0000 mg | ORAL_TABLET | Freq: Four times a day (QID) | ORAL | Status: DC | PRN

## 2016-04-04 NOTE — Patient Instructions (Signed)
I had a long d/w patient about his remote, bilateral carotid occlusions, stroke, risk for recurrent stroke/TIAs, personally independently reviewed imaging studies and stroke evaluation results and answered questions.Continue Plavix 75 mg daily  for secondary stroke prevention and maintain strict control of hypertension with blood pressure goal below 130/90, diabetes with hemoglobin A1c goal below 6.5% and lipids with LDL cholesterol goal below 70 mg/dL. plan to refer patient to orthopedics for her low back and left hip pain. Patient was given refills for Phenergan and tramadol upon request for migraines which appear to be well controlled. Followup in the future with stroke nurse practitioner in 6 months or call earlier if necessary.

## 2016-04-04 NOTE — Progress Notes (Signed)
PATIENT: April [name] DOB: Jan 01, 1956  REASON FOR VISIT: routine follow up for Migraine HISTORY FROM: patient  HISTORY OF PRESENT ILLNESS: HPI: 62 year lady with 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 She has mild persistent weakness and paresthesias in her left hand and gait difficulty. Moderate terminal right vertebrobasilar junction stenosis . She has frequent migraine headaches which continue to be suboptimally controlled.  9/23/1/3 (JM): She returns for followup after her last visit on 02/16/2012. She states her migraines are much better controlled and she is tolerating Depakote but can take it only at night. She has only about one headache a month and that responds to tramadol quite well. She has been watching her diet and has lost a few pounds. She is trying to quit smoking and is using electronic cigarettes and plans to see her primary care doctor to get further help. She remains on Plavix and is tolerating it well without side effects. She has been having bladder incontinence and needs to wear diapers. She may require bladder tag surgery but has not yet seen a urologist. She is having frequent bladder infections.  UPDATE 01/30/14 (LL): She states she has been having severe headaches. She has been to the ER twice since her last visit, which was 1 1/2 years ago. At each visit to the ER, her Depakote was increased. On the last trip to the ER she had had a migraine for 5 days. While there they did a MRI and CT Scans which were unremarkable. She also states that she is experiencing tremors in both arms and is having left side weakness, residual from stroke. She states that the tremors started a couple of months ago.   UPDATE 04/04/14 (LL):  Since last visit she has had only 1-2 severe  headaches.  They were relieved with Tramadol.  Hand tremor is less noticeable.  Now taking Depakote 500 mg bid.  Seeing Psych for depression regularly.  She is tolerating Plavix well with no signs of significant bleeding or bruising.  She is still smoking, has quit before and started back.  States she is working on it. Update 02/08/15 : She returns for follow-up after last visit 10 months ago. She reports 2 separate episodes of sudden onset of transient leg shaking for a few minutes one in February and another one on March 2 when she actually passed out for a few moments. She did not seek medical help. She denies any headache at that time blurred vision loss of vision extremity weakness and numbness. She also complains of numbness in the medial aspect of the right hand involving 2 fingers. She does have a tendency to sleep on her right side. She denies any neck pain, radicular pain, head injury, and can't accident and known history of degenerative cervical spine disease. She states the headaches are much better and she has tapered and discontinued the Depakote without any recurrence of her headaches. She is not had any cerebrovascular imaging in last couple of years but does have a history of bilateral carotid occlusion and left vertebral origin stenosis treated with the stent by Dr. Estanislado Pandy a few years ago. She has not had any recent follow-up with him. Update 07/11/2015 : She returns for f/u after last visit 5 months ago. She is stable from neurovascular standpoint with out symptoms. She had EMG/NCV on  07/05/15 which was normal and CTAngiogram neck and brain on 02/27/15 which I have both reviewed personally and show patent vertebral origin stent with known chronic bilateral ICA occlusions. And moderate right V 4 segment vertebral stenosis.She has 3-4 migraines per month which respond well to migraine excedrin.these are often trigerred by stress.She was on depakote for headache prophylaxis but stopped it due to side  effects.She compalins of chronic neck and back muscular pain and has seeen chripractor for these. Update 04/04/2016 : She returns for follow-up after last visit 9 months ago. She was admitted to the hospital in January 2017 with left-sided weakness which was felt to be related to hypertension. MRI scan of the brain was obtained which I personally reviewed did not show an acute infarct. Patient states she is showing gradual improvement but is yet not back to baseline. She also had an MRI scan cervical spine done as an outpatient on 9/26 at 16 which I personally reviewed showed mild disc degenerative changes without significant compression. EMG no conduction study was done which did not show evidence of carpal tunnel or neuropathy. Patient went on Plavix which is tolerating well but she does have some minor bruising and occasional bleeding but no major events. She states her blood pressure has been reasonably controlled and today it is 147/88. Her main complaint today is left hip as well as low back pain. She feels she cannot walk long distances because of the pain and has to sit down to feel better. She has seen her primary physician Dr. Redmond Pulling for this problem and he has prescribed meloxicam which helped initially but is no longer helping. She continues to migraine headaches but cannot frequent normally with from once a month or so. She takes tramadol and phenargan together which seems to help quite a bit. She has not yet been seen by orthopedic doctor for hip and back pain. Review of Systems  Out of a complete 14 system review, the patient complains of only the following symptoms, and all other reviewed systems are negative.  Ear discharge, hearing loss, ringing in the ears, blurred vision, cough, wheezing, leg swelling, heat intolerance, excessive thirst, nausea, restless leg, insomnia, frequent waking, snoring, incontinence of bladder, walking difficulty, easy bruising, dizziness, headache, numbness and all  other systems negative  ALLERGIES: No Known Allergies  HOME MEDICATIONS: Outpatient Prescriptions Prior to Visit  Medication Sig Dispense Refill  . albuterol (PROVENTIL HFA;VENTOLIN HFA) 108 (90 Base) MCG/ACT inhaler Inhale 2 puffs into the lungs every 6 (six) hours as needed for wheezing or shortness of breath.    Marland Kitchen aspirin-acetaminophen-caffeine (EXCEDRIN MIGRAINE) 250-250-65 MG per tablet Take 2 tablets by mouth every 6 (six) hours as needed for migraine.     . clonazePAM (KLONOPIN) 0.5 MG tablet TAKE 1 TABLET BY MOUTH TWICE DAILY  5  . clopidogrel (PLAVIX) 75 MG tablet Take 75 mg by mouth daily.    . cyclobenzaprine (FLEXERIL) 10 MG tablet Take 1 tablet (10 mg total) by mouth 3 (three) times daily as needed for muscle spasms. 1 tab twice daily x 3 days then increase to 2 tab twice daily if tolerated 20 tablet 0  . cycloSPORINE (RESTASIS) 0.05 % ophthalmic emulsion Place 1 drop into both eyes 2 (two) times daily.    Marland Kitchen docusate sodium (COLACE) 100 MG capsule Take 100 mg by mouth daily as needed (constipation).     . fenofibrate (TRICOR) 145 MG tablet Take 145 mg by mouth at bedtime.     . fluticasone (  FLONASE) 50 MCG/ACT nasal spray Place 2 sprays into both nostrils daily as needed for allergies or rhinitis.    . furosemide (LASIX) 20 MG tablet Take 20 mg by mouth daily as needed (ankle/feet swelling).     . Melatonin 5 MG TABS Take 5 mg by mouth at bedtime.     . meloxicam (MOBIC) 15 MG tablet Take 15 mg by mouth daily as needed (back pain).   2  . Multiple Vitamin (MULTIVITAMIN WITH MINERALS) TABS tablet Take 1 tablet by mouth daily.    . pantoprazole (PROTONIX) 40 MG tablet Take 40 mg by mouth daily.     . simvastatin (ZOCOR) 40 MG tablet Take 40 mg by mouth at bedtime.    . sodium chloride (OCEAN) 0.65 % SOLN nasal spray Place 2 sprays into both nostrils 3 (three) times daily as needed for congestion.     Marland Kitchen tiotropium (SPIRIVA) 18 MCG inhalation capsule Place 18 mcg into inhaler and  inhale daily.     . traZODone (DESYREL) 150 MG tablet Take 300 mg by mouth at bedtime.     Marland Kitchen venlafaxine XR (EFFEXOR-XR) 150 MG 24 hr capsule Take 150 mg by mouth daily.    . promethazine (PHENERGAN) 25 MG tablet Take 1 tablet (25 mg total) by mouth every 6 (six) hours as needed for nausea or vomiting. (Patient taking differently: Take 25 mg by mouth every 6 (six) hours as needed (migraine headaches). Take with tramadol) 90 tablet 1  . traMADol (ULTRAM) 50 MG tablet Take 1-2 tablets (50-100 mg total) by mouth every 6 (six) hours as needed (for headaches). (Patient taking differently: Take 50 mg by mouth every 6 (six) hours as needed (migraine headaches). Take with phenergan) 90 tablet 1   No facility-administered medications prior to visit.   PHYSICAL EXAM  Filed Vitals:   04/04/16 1159  BP: 147/88  Pulse: 89  Height: 5\' 3"  (1.6 m)  Weight: 246 lb 12.8 oz (111.948 kg)   Body mass index is 43.73 kg/(m^2). No exam data present   Physical Exam  General: Patient is well developed and well groomed. Patient is awake and alert and in no acute distress.  Respiratory: Lungs are clear to auscultation with normal respiratory effort.  Cardiovascular: Regular rate and rhythm with no murmurs. No carotid bruit.  Musculoskeletal: no deformity  Skin: No rash, no bruising . Old surgical scar right wrist from carpal tunnel surgery  Neurologic Exam  Mental Status: Awake, alert and oriented to person, place and time. Language is fluent and comprehensive.  Cranial Nerves: Pupils are equal and round and reactive to light. Conjugate eye movements are full and symmetric. Visual fields are full to confrontation.  . Facial sensation and facial muscle strength are symmetric and in normal limits. Hearing is intact. Palate elevated symmetrically and uvula is midline. Shoulder shrug is symmetric. Tongue is midline.  Motor: Symmetric normal motor tone is noted throughout. Normal muscle bulk. Testing reveals 5/5  muscle strength except 4/5 left shoulder strength. Diminished fine finger movements on the left. Mild left hand weakness.  Orbits right over left upper extremity.  Sensory: Intact and symmetric to light touch.   Coordination: Cerebellar testing reveals good finger-to-nose and heel-to-shin bilaterally. Mild tremor noted, R>L Gait and Station: Arises from chair without difficulty. Narrow based gait with normal arm swing bilateral, drags left leg slightly.able to walk on heels and toes. Tandem walk is unsteady. No pronator drift  Reflexes: DTR in the upper and lower extremity are present  and symmetric 2+.    ASSESSMENT AND PLAN 89 year lady with 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 She has mild persistent weakness and paresthesias in her left hand and gait difficulty yet. Moderate terminal right vertebrobasilar junction stenosis. New complaints of transient slurred and legs shaking with once brief loss of consciousness worrisome for limb shaking TIAs given her history of significant occlusive cerebrovascular disease. 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 plan to refer patient to orthopedics for her low back and left hip pain. Patient was given refills for Phenergan and  tramadol upon request for migraines which appear to be well controlled. Followup in the future with stroke nurse practitioner in 6 months or call earlier if necessary.         .            .       .           .       .       .         Return in about 6 months (around 10/04/2016).  Antony Contras, MD  04/04/2016, 2:53 PM [facility] [address], [place], [place] [address] [phone]  Note: This document was prepared with digital dictation and possible smart phrase technology. Any transcriptional errors that result from this process are unintentional.

## 2016-04-08 DIAGNOSIS — Z1231 Encounter for screening mammogram for malignant neoplasm of breast: Secondary | ICD-10-CM | POA: Diagnosis not present

## 2016-04-09 ENCOUNTER — Ambulatory Visit: Payer: PPO

## 2016-04-15 DIAGNOSIS — M5442 Lumbago with sciatica, left side: Secondary | ICD-10-CM | POA: Diagnosis not present

## 2016-04-25 DIAGNOSIS — M5442 Lumbago with sciatica, left side: Secondary | ICD-10-CM | POA: Diagnosis not present

## 2016-04-25 DIAGNOSIS — G8929 Other chronic pain: Secondary | ICD-10-CM | POA: Diagnosis not present

## 2016-05-01 DIAGNOSIS — M5442 Lumbago with sciatica, left side: Secondary | ICD-10-CM | POA: Diagnosis not present

## 2016-05-15 ENCOUNTER — Telehealth: Payer: Self-pay | Admitting: Neurology

## 2016-05-15 NOTE — Telephone Encounter (Addendum)
[facility] [phone] ext [phone] called to get medical clearance for SNRB transforaminal August 15th, patient is on Plavix, needs patient to stop Plavix 5 days prior to. Please fax to [phone].

## 2016-05-19 NOTE — Telephone Encounter (Signed)
Clearance form fax to Manuela Schwartz at [employer] at [phone].

## 2016-05-27 NOTE — Telephone Encounter (Signed)
Rn call patient back that April [name] has the clearance form for her procedure. Pt is taking the plavix and the GO has the recommendations from Dr. Leonie Man. Pt verbalized understanding.

## 2016-05-27 NOTE — Telephone Encounter (Signed)
Patient called requesting authorization to stop Plavix 5 days prior to steroid shots at [place].

## 2016-06-10 DIAGNOSIS — M5416 Radiculopathy, lumbar region: Secondary | ICD-10-CM | POA: Diagnosis not present

## 2016-06-20 DIAGNOSIS — J45909 Unspecified asthma, uncomplicated: Secondary | ICD-10-CM | POA: Insufficient documentation

## 2016-06-20 DIAGNOSIS — F5104 Psychophysiologic insomnia: Secondary | ICD-10-CM | POA: Diagnosis not present

## 2016-06-20 DIAGNOSIS — F411 Generalized anxiety disorder: Secondary | ICD-10-CM | POA: Diagnosis not present

## 2016-06-20 DIAGNOSIS — J4531 Mild persistent asthma with (acute) exacerbation: Secondary | ICD-10-CM | POA: Diagnosis not present

## 2016-06-20 DIAGNOSIS — E782 Mixed hyperlipidemia: Secondary | ICD-10-CM | POA: Diagnosis not present

## 2016-06-20 DIAGNOSIS — J449 Chronic obstructive pulmonary disease, unspecified: Secondary | ICD-10-CM | POA: Diagnosis not present

## 2016-07-11 ENCOUNTER — Telehealth: Payer: Self-pay | Admitting: Neurology

## 2016-07-11 MED ORDER — TRAMADOL HCL 50 MG PO TABS: 50.0000 mg | ORAL_TABLET | Freq: Four times a day (QID) | ORAL | 0 refills | Status: DC | PRN

## 2016-07-11 NOTE — Telephone Encounter (Signed)
Patient called in, reported that she has lost her 90 tablets tramadol prescription from June 2017 visit, is now with prolonged headaches, asking for more tramadol,  I have faxed tramadol 50 mg 1 tablet every 8 hours as needed 5 tablets without refill to her pharmacy [facility].  Please check controlled substance registry, to verify her medication use.

## 2016-07-11 NOTE — Telephone Encounter (Signed)
Check of controlled substance history reveals that the patient was given a prescription of 120 tablets of hydrocodone on 06/20/16 hence I agree with Dr. Krista Blue that patient would not prescribe any more Tramadol in the future and notify the patient

## 2016-07-14 DIAGNOSIS — M5442 Lumbago with sciatica, left side: Secondary | ICD-10-CM | POA: Diagnosis not present

## 2016-07-14 NOTE — Telephone Encounter (Signed)
See Dr.Sethi note. Rn call patient back about Dr.Sethi advice. Rn stated he cannot prescribed tramadol for headache anymore. Pt was given 90 pills in June and call on 07/11/2016 because she lost the prescription. Pt was given 5 pills by Dr. Krista Blue the work in [place]. PT stated she was only taking the hydrocodone-homatropine syrup for bronchitis for a week. Rn stated per Dr.Sethi note she would need to come in for headache management and be evaluated. Rn stated if needed to be evaluated for a alternative headache medication if necessary. Rn stated the reason because tramadol is a narcotic. Pt verbalized understanding and will call tomorrow to r/s earlier appt with Carolyn(NP).

## 2016-07-14 NOTE — Telephone Encounter (Signed)
Rn receive incoming call from patient. Rn stated per the narcotic website, she was prescribed hydrocodone-homatropine from her PCP. Pt stated that was for bronchitis, and she only took it a week. Rn ask patient how did she lose her tramadol. Pt stated she search her house and cannot find it. Rn will send message to [place].

## 2016-07-14 NOTE — Telephone Encounter (Signed)
LFt vm for patient that Dr.Sethi will not be filling tramadol anymore per his request. Rn left vm for patient to call back.

## 2016-07-14 NOTE — Telephone Encounter (Signed)
See Dr. Clydene Fake note about no more tramadol per his request.

## 2016-07-14 NOTE — Telephone Encounter (Signed)
Pt returned call. Please call and advise °

## 2016-07-14 NOTE — Telephone Encounter (Signed)
Patient is calling to get a refill for traMADol (ULTRAM) 50 MG tablet. I see in previous notes this medication will not be prescribed. Please call patient and discuss.

## 2016-07-14 NOTE — Telephone Encounter (Signed)
Kindly inform the patient that she will not get a refill for tramadol without an office visit. At the office visit the physician or nurse practitioner  will evaluate the need for tramadol and if felt necessary change it to alternative headache medication.

## 2016-07-14 NOTE — Telephone Encounter (Signed)
RN spoke with April [name] at [facility]. She stated they are filling the clonazepam, tramadol, and hydrocodone-homatropine syrup. The pt last had the hydrcodone-homatropine was last filled 06/20/2016. The clonazepam and hydrocdone was prescribed by Dr.Fred Redmond Pulling

## 2016-07-21 ENCOUNTER — Encounter: Payer: Self-pay | Admitting: Physical Therapy

## 2016-07-21 ENCOUNTER — Ambulatory Visit: Payer: PPO | Attending: Physician Assistant | Admitting: Physical Therapy

## 2016-07-21 DIAGNOSIS — R293 Abnormal posture: Secondary | ICD-10-CM | POA: Insufficient documentation

## 2016-07-21 DIAGNOSIS — M545 Low back pain: Secondary | ICD-10-CM | POA: Diagnosis not present

## 2016-07-21 NOTE — Therapy (Addendum)
[facility] [place], [place], [address] Phone: [phone]   Fax:  [phone]  Physical Therapy Evaluation  Patient Details  Name: April [name] MRN: [id number] Date of Birth: [birth year] Referring Provider: Ronette Deter MD.  Encounter Date: 07/21/2016      PT End of Session - 07/21/16 1245    Visit Number 1   Number of Visits 12   Date for PT Re-Evaluation 09/19/16   PT Start Time 1121   PT Stop Time 1209   PT Time Calculation (min) 48 min   Activity Tolerance Patient tolerated treatment well   Behavior During Therapy [facility] for tasks assessed/performed      Past Medical History:  Diagnosis Date  . Anxiety   . Arthritis   . COPD (chronic obstructive pulmonary disease) ([place])   . Coronary artery disease   . Depression   . GERD (gastroesophageal reflux disease)   . Headache(784.0)   . Hyperlipidemia   . Incontinent of urine   . Shortness of breath   . Stroke [facility])     Past Surgical History:  Procedure Laterality Date  . CAROTID STENT    . CHOLECYSTECTOMY    . TUBAL LIGATION      There were no vitals filed for this visit.       Subjective Assessment - 07/21/16 1237    Subjective The patient reports that approximately 6 months ago she began to experience left sided low back pain.  Symptoms are evolving and worsening with pain now running down the length of her left LE (posteriorly)  Her pain is a high 9-10/10 today.  She reports staying off her left leg lowers her pain but standing and walking increase her pain.  She also reports her left LE goes numb.  The patient further reports that an MRI revealed 2 lower lumbar disc bulges.   Limitations Standing;Walking   How long can you stand comfortably? <10 minutes.   How long can you walk comfortably? Short distances.   Patient Stated Goals To be able to do everyday household chores.   Currently in Pain? Yes   Pain Score 10-Worst pain ever   Pain Location Back    Pain Orientation Left   Pain Descriptors / Indicators Aching;Numbness   Pain Type Chronic pain   Pain Radiating Towards Left side and down left LE.   Pain Onset More than a month ago   Pain Frequency Constant   Aggravating Factors  Standing and walking.   Pain Relieving Factors Taking weight of left LE.            [facility] PT Assessment - 07/21/16 0001      Assessment   Medical Diagnosis Chronic left-sided low back pain with sciatica.   Referring Provider Ronette Deter MD.   Onset Date/Surgical Date --  6 months+.     Precautions   Precautions None     Restrictions   Weight Bearing Restrictions No     Balance Screen   Has the patient fallen in the past 6 months No   Has the patient had a decrease in activity level because of a fear of falling?  No   Is the patient reluctant to leave their home because of a fear of falling?  No     Home Ecologist residence     Prior Function   Level of Independence Independent     Posture/Postural Control   Posture/Postural Control Postural limitations  Postural Limitations Rounded Shoulders;Forward head;Decreased lumbar lordosis     ROM / Strength   AROM / PROM / Strength AROM;Strength     AROM   Overall AROM Comments Functional active lumbar flexion though lumbar extension is limited to 5 degrees.     Strength   Overall Strength Comments Normal LE strength.     Palpation   Palpation comment Tender to palpation over left lower lumbar region and especially tender over left SI ligament.     Special Tests    Special Tests --  Absent LE DTR's(=)leg lengths(-)Slump/SLR test(=)leg lengths     Ambulation/Gait   Gait Comments Very antalgic gait pattern with a decrease in stance time over her left LE.  The patient has a decreased step and stance length.  Her gait cadence is slow.                   [facility] PT Treatment/Exercise - 07/21/16 0001      Modalities   Modalities Electrical  Stimulation;Moist Heat     Moist Heat Therapy   Number Minutes Moist Heat 15 Minutes   Moist Heat Location --  Low back.     Acupuncturist Location --  Left lower back region.   Electrical Stimulation Action Constant pre-mod.   Electrical Stimulation Parameters 80-150 Hz.   Electrical Stimulation Goals Pain                  PT Short Term Goals - 07/21/16 1255      PT SHORT TERM GOAL #1   Title STG's=LTG's.           PT Long Term Goals - 07/21/16 1255      PT LONG TERM GOAL #1   Title Independent with a HEP.   Time 8   Period Weeks   Status New     PT LONG TERM GOAL #2   Title Stand 20 minutes with pain not > 3/10.   Time 8   Period Weeks   Status New     PT LONG TERM GOAL #3   Title Walk a community distance with pain not > 3/10.   Time 8   Period Weeks   Status New     PT LONG TERM GOAL #4   Title Eliminate left LE symptoms.   Time 8   Period Weeks   Status New               Plan - 07/21/16 1251    Clinical Impression Statement The patient presents with severe left sided low back pain and left LE pain and numbness.  Her gait is antalgic in nature.  She is very palpably tender in her left low back region and SIJ.  Her LE DTR's are absent.  Other special testing is negative.  her spinal range of motion is decreased.   Rehab Potential Good   PT Frequency 2x / week   PT Duration 4 weeks   PT Treatment/Interventions ADLs/Self Care Home Management;Electrical Stimulation;Cryotherapy;Ultrasound;Moist Heat;Therapeutic activities;Therapeutic exercise;Patient/family education;Manual techniques   PT Next Visit Plan Begin with low-level core exercises.  Modalities and STW/M to left low back and SI ligament region.      Patient will benefit from skilled therapeutic intervention in order to improve the following deficits and impairments:  Abnormal gait, Decreased activity tolerance, Pain, Decreased range of  motion  Visit Diagnosis: Left low back pain, with sciatica presence unspecified - Plan: PT plan of  care cert/re-cert  Abnormal posture - Plan: PT plan of care cert/re-cert      G-Codes - 70/96/28 1122    Functional Assessment Tool Used FOTO 65% limitation.   Functional Limitation Mobility: Walking and moving around   Mobility: Walking and Moving Around Current Status [phone]) At least 60 percent but less than 80 percent impaired, limited or restricted   Mobility: Walking and Moving Around Goal Status [phone]) At least 20 percent but less than 40 percent impaired, limited or restricted       Problem List Patient Active Problem List   Diagnosis Date Noted  . Back pain 04/04/2016  . Weakness   . Confusion   . Orthostatic dizziness   . Left-sided weakness 11/10/2015  . Polycythemia 11/10/2015  . Chronic diastolic CHF (congestive heart failure) ([place]) 11/10/2015  . Hypercarbia 11/10/2015  . Respiratory acidosis 11/10/2015  . Paresthesias in right hand 07/11/2015  . Migraine without aura, without mention of intractable migraine without mention of status migrainosus 01/30/2014  . Occlusion and stenosis of carotid artery without mention of cerebral infarction 01/30/2014  . Unspecified transient cerebral ischemia 01/30/2014  . Headache(784.0) 01/04/2013  . Dizziness 01/04/2013  . TIA (transient ischemic attack) 01/04/2013  . Migraine 01/04/2013  . COPD (chronic obstructive pulmonary disease) ([place]) 01/04/2013  PHYSICAL THERAPY DISCHARGE SUMMARY  Visits from Start of Care: 1.  Current functional level related to goals / functional outcomes: See above.   Remaining deficits: Pt did not return.   Education / Equipment: HEP. Plan: Patient agrees to discharge.  Patient goals were not met. Patient is being discharged due to not returning since the last visit.  ?????       Milan Clare, [place] MPT 07/21/2016, 1:11 PM  [facility] [address] [place], [place], [address] Phone: [phone]   Fax:  [phone]  Name: Shamina Etheridge MRN: [id number] Date of Birth: 02/28/1956

## 2016-07-24 ENCOUNTER — Encounter: Payer: PPO | Admitting: Physical Therapy

## 2016-09-24 DIAGNOSIS — Z23 Encounter for immunization: Secondary | ICD-10-CM | POA: Diagnosis not present

## 2016-09-24 DIAGNOSIS — M5442 Lumbago with sciatica, left side: Secondary | ICD-10-CM | POA: Diagnosis not present

## 2016-09-24 DIAGNOSIS — J4521 Mild intermittent asthma with (acute) exacerbation: Secondary | ICD-10-CM | POA: Diagnosis not present

## 2016-09-24 DIAGNOSIS — J0140 Acute pansinusitis, unspecified: Secondary | ICD-10-CM | POA: Diagnosis not present

## 2016-09-24 DIAGNOSIS — H60592 Other noninfective acute otitis externa, left ear: Secondary | ICD-10-CM | POA: Diagnosis not present

## 2016-09-29 ENCOUNTER — Ambulatory Visit: Payer: PPO | Admitting: Nurse Practitioner

## 2016-09-30 ENCOUNTER — Encounter: Payer: Self-pay | Admitting: Nurse Practitioner

## 2016-10-14 ENCOUNTER — Ambulatory Visit: Payer: PPO | Admitting: Nurse Practitioner

## 2016-11-04 DIAGNOSIS — G8929 Other chronic pain: Secondary | ICD-10-CM | POA: Diagnosis not present

## 2016-11-04 DIAGNOSIS — M5442 Lumbago with sciatica, left side: Secondary | ICD-10-CM | POA: Diagnosis not present

## 2016-12-22 DIAGNOSIS — F411 Generalized anxiety disorder: Secondary | ICD-10-CM | POA: Diagnosis not present

## 2016-12-22 DIAGNOSIS — F5104 Psychophysiologic insomnia: Secondary | ICD-10-CM | POA: Diagnosis not present

## 2016-12-22 DIAGNOSIS — J449 Chronic obstructive pulmonary disease, unspecified: Secondary | ICD-10-CM | POA: Diagnosis not present

## 2016-12-22 DIAGNOSIS — E039 Hypothyroidism, unspecified: Secondary | ICD-10-CM | POA: Diagnosis not present

## 2016-12-22 DIAGNOSIS — F3341 Major depressive disorder, recurrent, in partial remission: Secondary | ICD-10-CM | POA: Diagnosis not present

## 2016-12-22 DIAGNOSIS — E782 Mixed hyperlipidemia: Secondary | ICD-10-CM | POA: Diagnosis not present

## 2016-12-22 DIAGNOSIS — I10 Essential (primary) hypertension: Secondary | ICD-10-CM | POA: Diagnosis not present

## 2017-03-12 ENCOUNTER — Ambulatory Visit: Payer: PPO | Admitting: Nurse Practitioner

## 2017-03-30 ENCOUNTER — Ambulatory Visit (INDEPENDENT_AMBULATORY_CARE_PROVIDER_SITE_OTHER): Payer: PPO | Admitting: Nurse Practitioner

## 2017-03-30 ENCOUNTER — Encounter: Payer: Self-pay | Admitting: Nurse Practitioner

## 2017-03-30 ENCOUNTER — Encounter (INDEPENDENT_AMBULATORY_CARE_PROVIDER_SITE_OTHER): Payer: Self-pay

## 2017-03-30 VITALS — BP 134/82 | HR 88 | Ht 63.0 in | Wt 234.6 lb

## 2017-03-30 DIAGNOSIS — I6523 Occlusion and stenosis of bilateral carotid arteries: Secondary | ICD-10-CM | POA: Diagnosis not present

## 2017-03-30 DIAGNOSIS — G43009 Migraine without aura, not intractable, without status migrainosus: Secondary | ICD-10-CM

## 2017-03-30 MED ORDER — PROMETHAZINE HCL 25 MG PO TABS: 25.0000 mg | ORAL_TABLET | Freq: Four times a day (QID) | ORAL | 1 refills | Status: DC | PRN

## 2017-03-30 MED ORDER — TRAMADOL HCL 50 MG PO TABS: 50.0000 mg | ORAL_TABLET | Freq: Four times a day (QID) | ORAL | 0 refills | Status: DC | PRN

## 2017-03-30 NOTE — Patient Instructions (Addendum)
Continue Plavix 75 mg daily  for secondary stroke prevention  Maintain strict control of hypertension with blood pressure goal below 130/90, todays reading 134/82 diabetes with hemoglobin A1c goal below 6.5%  lipids with LDL cholesterol goal below 70 mg/dL. Continue Zocor Ortho has referred to pain management  for her low back and left hip pain.  Patient was given refills for Phenergan and tramadol upon request for migraines which appear to be well controlled. Until seen by pain management Follow up prn

## 2017-03-30 NOTE — Progress Notes (Signed)
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 She has mild persistent weakness and paresthesias in her left hand and gait difficulty. Moderate terminal right vertebrobasilar junction stenosis . She has frequent migraine headaches which continue to be suboptimally controlled.  9/23/1/3 (JM): She returns for followup after her last visit on 02/16/2012. She states her migraines are much better controlled and she is tolerating Depakote but can take it only at night. She has only about one headache a month and that responds to tramadol quite well. She has been watching her diet and has lost a few pounds. She is trying to quit smoking and is using electronic cigarettes and plans to see her primary care doctor to get further help. She remains on Plavix and is tolerating it well without side effects. She has been having bladder incontinence and needs to wear diapers. She may require bladder tag surgery but has not yet seen a urologist. She is having frequent bladder infections.  UPDATE 01/30/14 (LL): She states she has been having severe headaches. She has been to the ER twice since her last visit, which was 1 1/2 years ago. At each visit to the ER, her Depakote was increased. On the last trip to the ER she had had a migraine for 5 days. While there they did a MRI and CT Scans which were unremarkable. She also states that she is experiencing tremors in both arms and is having left side weakness, residual from stroke. She states that  the tremors started a couple of months ago.   UPDATE 04/04/14 (LL):  Since last visit she has had only 1-2 severe headaches.  They were relieved with Tramadol.  Hand tremor is less noticeable.  Now taking Depakote 500 mg bid.  Seeing Psych for depression regularly.  She is tolerating Plavix well with no signs of significant bleeding or bruising.  She is still smoking, has quit before and started back.  States she is working on it. Update 02/08/15 : She returns for follow-up after last visit 10 months ago. She reports 2 separate episodes of sudden onset of transient leg shaking for a few minutes one in February and another one on March 2 when she actually passed out for a few moments. She did not seek medical help. She denies any headache at that time blurred vision loss of vision extremity weakness and numbness. She also complains of numbness in the medial aspect of the right hand involving 2 fingers. She does have a tendency to sleep on her right side. She denies any neck pain, radicular pain, head injury, and can't accident and known history of degenerative cervical spine disease. She states the headaches are much better and she has tapered and discontinued the Depakote without any recurrence of her headaches. She is not had any cerebrovascular imaging in last couple of years but does have a history of bilateral carotid occlusion and left vertebral origin stenosis treated with the stent by Dr. Estanislado Pandy a few years ago. She has not had any recent follow-up with him. Update 9/14/2016PS : She returns for f/u after last  visit 5 months ago. She is stable from neurovascular standpoint with out symptoms. She had EMG/NCV on 07/05/15 which was normal and CTAngiogram neck and brain on 02/27/15 which I have both reviewed personally and show patent vertebral origin stent with known chronic bilateral ICA occlusions. And moderate right V 4 segment vertebral stenosis.She has 3-4 migraines per month which respond well to migraine  excedrin.these are often trigerred by stress.She was on depakote for headache prophylaxis but stopped it due to side effects.She compalins of chronic neck and back muscular pain and has seeen chripractor for these. Update 6/9/2017PS : She returns for follow-up after last visit 9 months ago. She was admitted to the hospital in January 2017 with left-sided weakness which was felt to be related to hypertension. MRI scan of the brain was obtained which I personally reviewed did not show an acute infarct. Patient states she is showing gradual improvement but is yet not back to baseline. She also had an MRI scan cervical spine done as an outpatient on 9/26 at 16 which I personally reviewed showed mild disc degenerative changes without significant compression. EMG no conduction study was done which did not show evidence of carpal tunnel or neuropathy. Patient went on Plavix which is tolerating well but she does have some minor bruising and occasional bleeding but no major events. She states her blood pressure has been reasonably controlled and today it is 147/88. Her main complaint today is left hip as well as low back pain. She feels she cannot walk long distances because of the pain and has to sit down to feel better. She has seen her primary physician Dr. Redmond Pulling for this problem and he has prescribed meloxicam which helped initially but is no longer helping. She continues to migraine headaches but cannot frequent normally with from once a month or so. She takes tramadol and phenargan together which seems to help quite a bit. She has not yet been seen by orthopedic doctor for hip and back pain. UPDATE 06/04/2018CM [name], 61 year old female returns for follow-up. Her last visit to this office was June 2017. She had a referral to Dr. Rolena Infante orthopedist for her back pain. She has now been referred to pain specialist but has not had her first appointment. She continues to have migraines sometimes one month and then  she can have up to 5 she claims Ultram relieves her headaches along with Phenergan. Patient remains on Plavix which she is tolerating with minimal bruising and no major events. Blood pressure in the office today 134/82 she is on Zocor for hyperlipidemia without complaints of myalgias. She gets no regular exercise. She feels she cannot walk long distances due to her back pain. She returns for reevaluation  REVIEW OF SYSTEMS: Full 14 system review of systems performed and notable only for those listed, all others are neg:  Constitutional: Fatigue Cardiovascular: neg Ear/Nose/Throat: neg  Skin: neg Eyes: Blurred vision Respiratory: neg Gastroitestinal: Urinary urgency  Hematology/Lymphatic: neg  Endocrine: Intolerance to heat and cold Musculoskeletal: Walking difficulty back pain Allergy/Immunology: neg Neurological: Occasional headache Psychiatric: Depression and anxiety Sleep : neg   ALLERGIES: No Known Allergies  HOME MEDICATIONS: Outpatient Medications Prior to Visit  Medication Sig Dispense Refill  . albuterol (PROVENTIL HFA;VENTOLIN HFA) 108 (90 Base) MCG/ACT inhaler Inhale 2 puffs into the lungs every 6 (six) hours as needed for wheezing or shortness of breath.    Marland Kitchen aspirin-acetaminophen-caffeine (EXCEDRIN MIGRAINE) 250-250-65 MG per tablet Take 2 tablets by mouth every 6 (six) hours as  needed for migraine.     . clonazePAM (KLONOPIN) 0.5 MG tablet TAKE 1 TABLET BY MOUTH TWICE DAILY  5  . clopidogrel (PLAVIX) 75 MG tablet Take 75 mg by mouth daily.    . cyclobenzaprine (FLEXERIL) 10 MG tablet Take 1 tablet (10 mg total) by mouth 3 (three) times daily as needed for muscle spasms. 1 tab twice daily x 3 days then increase to 2 tab twice daily if tolerated 20 tablet 0  . cycloSPORINE (RESTASIS) 0.05 % ophthalmic emulsion Place 1 drop into both eyes 2 (two) times daily.    Marland Kitchen docusate sodium (COLACE) 100 MG capsule Take 100 mg by mouth daily as needed (constipation).     . fluticasone  (FLONASE) 50 MCG/ACT nasal spray Place 2 sprays into both nostrils daily as needed for allergies or rhinitis.    . furosemide (LASIX) 20 MG tablet Take 20 mg by mouth daily as needed (ankle/feet swelling).     . meloxicam (MOBIC) 15 MG tablet Take 15 mg by mouth daily as needed (back pain).   2  . Multiple Vitamin (MULTIVITAMIN WITH MINERALS) TABS tablet Take 1 tablet by mouth daily.    . pantoprazole (PROTONIX) 40 MG tablet Take 40 mg by mouth daily.     . promethazine (PHENERGAN) 25 MG tablet Take 1 tablet (25 mg total) by mouth every 6 (six) hours as needed (migraine headaches). Take with tramadol 90 tablet 1  . simvastatin (ZOCOR) 40 MG tablet Take 40 mg by mouth at bedtime.    . sodium chloride (OCEAN) 0.65 % SOLN nasal spray Place 2 sprays into both nostrils 3 (three) times daily as needed for congestion.     Marland Kitchen tiotropium (SPIRIVA) 18 MCG inhalation capsule Place 18 mcg into inhaler and inhale daily.     . traMADol (ULTRAM) 50 MG tablet Take 1 tablet (50 mg total) by mouth every 6 (six) hours as needed. 5 tablet 0  . traZODone (DESYREL) 150 MG tablet Take 300 mg by mouth at bedtime.     Marland Kitchen venlafaxine XR (EFFEXOR-XR) 150 MG 24 hr capsule Take 150 mg by mouth daily.    . traMADol (ULTRAM) 50 MG tablet Take 1 tablet (50 mg total) by mouth every 6 (six) hours as needed (migraine headaches). Take with phenergan (Patient not taking: Reported on 03/30/2017) 90 tablet 1  . fenofibrate (TRICOR) 145 MG tablet Take 145 mg by mouth at bedtime.     . Melatonin 5 MG TABS Take 5 mg by mouth at bedtime.      No facility-administered medications prior to visit.     PAST MEDICAL HISTORY: Past Medical History:  Diagnosis Date  . Anxiety   . Arthritis   . COPD (chronic obstructive pulmonary disease) ([place])   . Coronary artery disease   . Depression   . GERD (gastroesophageal reflux disease)   . Headache(784.0)   . Hyperlipidemia   . Incontinent of urine   . Shortness of breath   . Stroke [facility])      PAST SURGICAL HISTORY: Past Surgical History:  Procedure Laterality Date  . CAROTID STENT    . CHOLECYSTECTOMY    . TUBAL LIGATION      FAMILY HISTORY: Family History  Problem Relation Age of Onset  . Migraines Mother   . Heart attack Mother     SOCIAL HISTORY: Social History   Social History  . Marital status: Divorced    Spouse name: N/A  . Number of children: 1  .  Years of education: 12th   Occupational History  . disabled    Social History Main Topics  . Smoking status: Current Some Day Smoker    Packs/day: 0.50    Years: 40.00    Types: Cigarettes  . Smokeless tobacco: Never Used  . Alcohol use No  . Drug use: No  . Sexual activity: No   Other Topics Concern  . Not on file   Social History Narrative   Patient lives at home alone.   Patient drink diet coke  (4 daily avg).     PHYSICAL EXAM  Vitals:   03/30/17 1416  BP: 134/82  Pulse: 88  Weight: 234 lb 9.6 oz (106.4 kg)  Height: 5\' 3"  (1.6 m)   Body mass index is 41.56 kg/m.  Generalized: Well developed, Obese female in no acute distress , well-groomed Head: normocephalic and atraumatic,. Oropharynx benign  Neck: Supple, no carotid bruits  Cardiac: Regular rate rhythm, no murmur  Musculoskeletal: No deformity  Skin surgical scar right wrist from carpal tunnel surgery  Neurological examination   Mentation: Alert oriented to time, place, history taking. Attention span and concentration appropriate. Recent and remote memory intact.  Follows all commands speech and language fluent.   Cranial nerve II-XII: .Pupils were equal round reactive to light extraocular movements were full, visual field were full on confrontational test. Facial sensation and strength were normal. hearing was intact to finger rubbing bilaterally. Uvula tongue midline. head turning and shoulder shrug were normal and symmetric.Tongue protrusion into cheek strength was normal. Motor: normal bulk and tone, full strength in  the BUE, BLE, except mild left hand weakness  Sensory: normal and symmetric to light touch,  Coordination: finger-nose-finger, heel-to-shin bilaterally, no dysmetria Reflexes: Brachioradialis 2/2, biceps 2/2, triceps 2/2, patellar 2/2, Achilles 2/2, plantar responses were flexor bilaterally. Gait and Station: Rising up from seated position without assistance, normal stance,  moderate stride, good arm swing, smooth turning, able to perform tiptoe, and heel walking without difficulty. Tandem gait is unsteady  DIAGNOSTIC DATA (LABS, IMAGING, TESTING) - I reviewed patient records, labs, notes, testing and imaging myself where available.  Lab Results  Component Value Date   WBC 6.5 11/11/2015   HGB 14.1 11/11/2015   HCT 41.2 11/12/2015   MCV 87.1 11/11/2015   PLT 177 11/11/2015      Component Value Date/Time   NA 141 11/12/2015 0559   K 3.6 11/12/2015 0559   CL 108 11/12/2015 0559   CO2 28 11/12/2015 0559   GLUCOSE 109 (H) 11/12/2015 0559   BUN 14 11/12/2015 0559   CREATININE 1.03 (H) 11/12/2015 0559   CALCIUM 8.5 (L) 11/12/2015 0559   PROT 6.4 (L) 11/10/2015 1610   ALBUMIN 3.4 (L) 11/10/2015 1610   AST 17 11/10/2015 1610   ALT 18 11/10/2015 1610   ALKPHOS 25 (L) 11/10/2015 1610   BILITOT 0.5 11/10/2015 1610   GFRNONAA 59 (L) 11/12/2015 0559   GFRAA >60 11/12/2015 0559   Lab Results  Component Value Date   CHOL 98 11/11/2015   HDL 35 (L) 11/11/2015   LDLCALC 41 11/11/2015   TRIG 108 11/11/2015   CHOLHDL 2.8 11/11/2015   Lab Results  Component Value Date   HGBA1C 5.9 (H) 11/11/2015   Lab Results  Component Value Date   BTDVVOHY07 371 11/12/2015   Lab Results  Component Value Date   TSH 4.751 (H) 11/12/2015      ASSESSMENT AND PLAN 70 year lady with 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 She has mild persistent weakness and paresthesias in her left hand and gait difficulty . Moderate terminal right vertebrobasilar junction stenosis. 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 Continue Zocor Ortho has referred to pain management  for her low back and left hip pain.  Patient was given refills for Phenergan and tramadol upon request for migraines which appear to be well controlled But made aware when she starts pain  management they can fill pain medications.  Review CONTROLLED SUBSTANCE REGISTRY Follow up prn Dennie Bible, [facility], [facility], APRN  [facility] [address] [place], [place] [address] [phone]

## 2017-03-30 NOTE — Progress Notes (Signed)
Fax confirmation for tramadol [facility] [phone]. sy

## 2017-03-31 NOTE — Progress Notes (Signed)
I agree with the above plan 

## 2017-06-15 DIAGNOSIS — G459 Transient cerebral ischemic attack, unspecified: Secondary | ICD-10-CM | POA: Diagnosis not present

## 2017-06-15 DIAGNOSIS — J449 Chronic obstructive pulmonary disease, unspecified: Secondary | ICD-10-CM | POA: Diagnosis not present

## 2017-06-15 DIAGNOSIS — F411 Generalized anxiety disorder: Secondary | ICD-10-CM | POA: Diagnosis not present

## 2017-06-15 DIAGNOSIS — K219 Gastro-esophageal reflux disease without esophagitis: Secondary | ICD-10-CM | POA: Diagnosis not present

## 2017-07-29 DIAGNOSIS — M545 Low back pain: Secondary | ICD-10-CM | POA: Diagnosis not present

## 2017-07-29 DIAGNOSIS — M25552 Pain in left hip: Secondary | ICD-10-CM | POA: Diagnosis not present

## 2017-07-29 DIAGNOSIS — Z23 Encounter for immunization: Secondary | ICD-10-CM | POA: Diagnosis not present

## 2017-07-29 DIAGNOSIS — G8929 Other chronic pain: Secondary | ICD-10-CM | POA: Diagnosis not present

## 2017-11-10 DIAGNOSIS — G8929 Other chronic pain: Secondary | ICD-10-CM | POA: Diagnosis not present

## 2017-11-10 DIAGNOSIS — M545 Low back pain: Secondary | ICD-10-CM | POA: Diagnosis not present

## 2017-11-12 DIAGNOSIS — G8929 Other chronic pain: Secondary | ICD-10-CM | POA: Diagnosis not present

## 2017-11-30 DIAGNOSIS — J449 Chronic obstructive pulmonary disease, unspecified: Secondary | ICD-10-CM | POA: Diagnosis not present

## 2017-11-30 DIAGNOSIS — E782 Mixed hyperlipidemia: Secondary | ICD-10-CM | POA: Diagnosis not present

## 2017-11-30 DIAGNOSIS — K219 Gastro-esophageal reflux disease without esophagitis: Secondary | ICD-10-CM | POA: Diagnosis not present

## 2017-11-30 DIAGNOSIS — E039 Hypothyroidism, unspecified: Secondary | ICD-10-CM | POA: Diagnosis not present

## 2017-11-30 DIAGNOSIS — I10 Essential (primary) hypertension: Secondary | ICD-10-CM | POA: Diagnosis not present

## 2017-11-30 DIAGNOSIS — F3341 Major depressive disorder, recurrent, in partial remission: Secondary | ICD-10-CM | POA: Diagnosis not present

## 2018-02-14 DIAGNOSIS — R531 Weakness: Secondary | ICD-10-CM | POA: Diagnosis not present

## 2018-02-14 DIAGNOSIS — R404 Transient alteration of awareness: Secondary | ICD-10-CM | POA: Diagnosis not present

## 2018-03-18 DIAGNOSIS — J0100 Acute maxillary sinusitis, unspecified: Secondary | ICD-10-CM | POA: Diagnosis not present

## 2018-03-18 DIAGNOSIS — J40 Bronchitis, not specified as acute or chronic: Secondary | ICD-10-CM | POA: Diagnosis not present

## 2018-05-31 DIAGNOSIS — Z1231 Encounter for screening mammogram for malignant neoplasm of breast: Secondary | ICD-10-CM | POA: Diagnosis not present

## 2018-05-31 DIAGNOSIS — K219 Gastro-esophageal reflux disease without esophagitis: Secondary | ICD-10-CM | POA: Diagnosis not present

## 2018-05-31 DIAGNOSIS — Z8673 Personal history of transient ischemic attack (TIA), and cerebral infarction without residual deficits: Secondary | ICD-10-CM | POA: Diagnosis not present

## 2018-05-31 DIAGNOSIS — Z0000 Encounter for general adult medical examination without abnormal findings: Secondary | ICD-10-CM | POA: Diagnosis not present

## 2018-05-31 DIAGNOSIS — M545 Low back pain: Secondary | ICD-10-CM | POA: Diagnosis not present

## 2018-05-31 DIAGNOSIS — G8929 Other chronic pain: Secondary | ICD-10-CM | POA: Diagnosis not present

## 2018-05-31 DIAGNOSIS — G459 Transient cerebral ischemic attack, unspecified: Secondary | ICD-10-CM | POA: Diagnosis not present

## 2018-05-31 DIAGNOSIS — E7800 Pure hypercholesterolemia, unspecified: Secondary | ICD-10-CM | POA: Diagnosis not present

## 2018-05-31 DIAGNOSIS — F5104 Psychophysiologic insomnia: Secondary | ICD-10-CM | POA: Diagnosis not present

## 2018-05-31 DIAGNOSIS — F411 Generalized anxiety disorder: Secondary | ICD-10-CM | POA: Diagnosis not present

## 2018-05-31 DIAGNOSIS — R2681 Unsteadiness on feet: Secondary | ICD-10-CM | POA: Diagnosis not present

## 2018-06-12 DIAGNOSIS — R2681 Unsteadiness on feet: Secondary | ICD-10-CM | POA: Insufficient documentation

## 2018-07-21 ENCOUNTER — Encounter: Payer: Self-pay | Admitting: Neurology

## 2018-07-21 ENCOUNTER — Telehealth: Payer: Self-pay | Admitting: Neurology

## 2018-07-21 ENCOUNTER — Ambulatory Visit: Payer: PPO | Admitting: Neurology

## 2018-07-21 VITALS — BP 119/74 | HR 81 | Ht 66.0 in | Wt 270.0 lb

## 2018-07-21 DIAGNOSIS — I6503 Occlusion and stenosis of bilateral vertebral arteries: Secondary | ICD-10-CM

## 2018-07-21 DIAGNOSIS — G450 Vertebro-basilar artery syndrome: Secondary | ICD-10-CM

## 2018-07-21 DIAGNOSIS — R55 Syncope and collapse: Secondary | ICD-10-CM | POA: Diagnosis not present

## 2018-07-21 NOTE — Telephone Encounter (Signed)
health team order sent to GI. No auth they will reach out to the pt to schedule.  °

## 2018-07-21 NOTE — Patient Instructions (Signed)
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  She has remote history of bilateral vertebral artery origin stents in 2011 which may possibly have developed some restenosis.  I recommend we check MRI scan of the brain with MRA of the brain and neck.  Also check EEG for seizure activity.  I advised her to limit her neck extension and to drink plenty of fluids.  Continue Plavix for stroke prevention with strict control of hypertension with blood pressure goal below 1 three 0/9 0, lipids with LDL cholesterol goal below 7 0 mg percent and diabetes with hemoglobin A1c goal below 6.5.  She was also encouraged to eat a healthy diet with lots of fruits vegetables cereals and whole grains and to lose weight and to be active and exercise regularly.  She was advised to follow-up with her orthopedic surgeon Dr. Rolena Infante as well as the pain clinic for her chronic back pain.  She will return for follow-up in 3 months or call earlier if necessary.

## 2018-07-21 NOTE — Progress Notes (Signed)
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 She has mild persistent weakness and paresthesias in her left hand and gait difficulty. Moderate terminal right vertebrobasilar junction stenosis . She has frequent migraine headaches which continue to be suboptimally controlled.  9/23/1/3 (JM): She returns for followup after her last visit on 02/16/2012. She states her migraines are much better controlled and she is tolerating Depakote but can take it only at night. She has only about one headache a month and that responds to tramadol quite well. She has been watching her diet and has lost a few pounds. She is trying to quit smoking and is using electronic cigarettes and plans to see her primary care doctor to get further help. She remains on Plavix and is tolerating it well without side effects. She has been having bladder incontinence and needs to wear diapers. She may require bladder tag surgery but has not yet seen a urologist. She is having frequent bladder infections.  UPDATE 01/30/14 (LL): She states she has been having severe headaches. She has been to the ER twice since her last visit, which was 1 1/2 years ago. At each visit to the ER, her Depakote was increased. On the last trip to the ER she had had a migraine for 5 days. While there they did a MRI and CT Scans which were unremarkable. She also states that she is experiencing tremors in both arms and is having left side weakness, residual from stroke. She states that  the tremors started a couple of months ago.   UPDATE 04/04/14 (LL):  Since last visit she has had only 1-2 severe headaches.  They were relieved with Tramadol.  Hand tremor is less noticeable.  Now taking Depakote 500 mg bid.  Seeing Psych for depression regularly.  She is tolerating Plavix well with no signs of significant bleeding or bruising.  She is still smoking, has quit before and started back.  States she is working on it. Update 02/08/15 : She returns for follow-up after last visit 10 months ago. She reports 2 separate episodes of sudden onset of transient leg shaking for a few minutes one in February and another one on March 2 when she actually passed out for a few moments. She did not seek medical help. She denies any headache at that time blurred vision loss of vision extremity weakness and numbness. She also complains of numbness in the medial aspect of the right hand involving 2 fingers. She does have a tendency to sleep on her right side. She denies any neck pain, radicular pain, head injury, and can't accident and known history of degenerative cervical spine disease. She states the headaches are much better and she has tapered and discontinued the Depakote without any recurrence of her headaches. She is not had any cerebrovascular imaging in last couple of years but does have a history of bilateral carotid occlusion and left vertebral origin stenosis treated with the stent by Dr. Estanislado Pandy a few years ago. She has not had any recent follow-up with him. Update 9/14/2016PS : She returns for f/u after last  visit 5 months ago. She is stable from neurovascular standpoint with out symptoms. She had EMG/NCV on 07/05/15 which was normal and CTAngiogram neck and brain on 02/27/15 which I have both reviewed personally and show patent vertebral origin stent with known chronic bilateral ICA occlusions. And moderate right V 4 segment vertebral stenosis.She has 3-4 migraines per month which respond well to migraine  excedrin.these are often trigerred by stress.She was on depakote for headache prophylaxis but stopped it due to side effects.She compalins of chronic neck and back muscular pain and has seeen chripractor for these. Update 6/9/2017PS : She returns for follow-up after last visit 9 months ago. She was admitted to the hospital in January 2017 with left-sided weakness which was felt to be related to hypertension. MRI scan of the brain was obtained which I personally reviewed did not show an acute infarct. Patient states she is showing gradual improvement but is yet not back to baseline. She also had an MRI scan cervical spine done as an outpatient on 9/26 at 16 which I personally reviewed showed mild disc degenerative changes without significant compression. EMG no conduction study was done which did not show evidence of carpal tunnel or neuropathy. Patient went on Plavix which is tolerating well but she does have some minor bruising and occasional bleeding but no major events. She states her blood pressure has been reasonably controlled and today it is 147/88. Her main complaint today is left hip as well as low back pain. She feels she cannot walk long distances because of the pain and has to sit down to feel better. She has seen her primary physician Dr. Redmond Pulling for this problem and he has prescribed meloxicam which helped initially but is no longer helping. She continues to migraine headaches but cannot frequent normally with from once a month or so. She takes tramadol and phenargan together which seems to help quite a bit. She has not yet been seen by orthopedic doctor for hip and back pain. UPDATE 06/04/2018CM [name], 62 year old female returns for follow-up. Her last visit to this office was June 2017. She had a referral to Dr. Rolena Infante orthopedist for her back pain. She has now been referred to pain specialist but has not had her first appointment. She continues to have migraines sometimes one month and then  she can have up to 5 she claims Ultram relieves her headaches along with Phenergan. Patient remains on Plavix which she is tolerating with minimal bruising and no major events. Blood pressure in the office today 134/82 she is on Zocor for hyperlipidemia without complaints of myalgias. She gets no regular exercise. She feels she cannot walk long distances due to her back pain. She returns for reevaluation Update 07/21/2018 : Patient is referred back by primary physician Dr. Redmond Pulling for evaluation for dizziness.  The patient however tells me that her main complaint is an episode of passing out 3 weeks ago.  She had a usual day and she got up early in the morning and open the refrigerator door and then passed out.  She denied significant chest pain palpitations or pressure on her chest.  She cannot recall how long she was on the floor.  When she got up she did complain of feeling sweaty and tired and having a headache.  She could barely move her legs and her arms felt heavy.  She laid there for a while and then gradually felt better and was able to sit up.  She had a bad headache which  lasted for several hours.  She did mention this to her primary care physician but no specific testing was ordered.  She does have a remote history of bilateral carotid occlusion and vertebral artery stents but she has not had any follow-up neuroimaging to evaluate for patency in recent years.  She does complain of chronic back pain and gait and balance difficulties for which she sees sees orthopedic Dr. Rolena Infante as well as is followed at the pain clinic.  She remains on Plavix for stroke prevention which is tolerating well with only minor bruising and no bleeding episodes.  Her blood pressure today is asymmetric in upper extremities with the left radial pulse being feeble.  Her last A1c and lipid profile were checked in January this for this year and was satisfactory.  She is tolerating Zocor well without side effects.  She states she is  eating healthy and has recently lost 14 pounds.  She complains of dizziness and transient vertigo with neck position changes particularly when she is looking up.  In fact when she goes to grocery store she can never reach up for anything from the top shelf because that makes her dizzy.   REVIEW OF SYSTEMS: Full 14 system review of systems performed and notable only for those listed, all others are neg:  Problem appetite change ear discharge ringing in the ears wheezing chest pain insomnia snoring joint and back pain muscle cramps walking difficulty neck pain confusion anxiety nervousness passing out tremors dizziness headache and all other systems negative  ALLERGIES: No Known Allergies  HOME MEDICATIONS: Outpatient Medications Prior to Visit  Medication Sig Dispense Refill  . aspirin-acetaminophen-caffeine (EXCEDRIN MIGRAINE) 250-250-65 MG per tablet Take 2 tablets by mouth every 6 (six) hours as needed for migraine.     . clonazePAM (KLONOPIN) 0.5 MG tablet TAKE 1 TABLET BY MOUTH TWICE DAILY  5  . clopidogrel (PLAVIX) 75 MG tablet Take 75 mg by mouth daily.    . cyclobenzaprine (FLEXERIL) 10 MG tablet Take 1 tablet (10 mg total) by mouth 3 (three) times daily as needed for muscle spasms. 1 tab twice daily x 3 days then increase to 2 tab twice daily if tolerated 20 tablet 0  . cycloSPORINE (RESTASIS) 0.05 % ophthalmic emulsion Place 1 drop into both eyes 2 (two) times daily.    Marland Kitchen docusate sodium (COLACE) 100 MG capsule Take 100 mg by mouth daily as needed (constipation).     . fluticasone (FLONASE) 50 MCG/ACT nasal spray Place 2 sprays into both nostrils daily as needed for allergies or rhinitis.    . furosemide (LASIX) 20 MG tablet Take 20 mg by mouth daily as needed (ankle/feet swelling).     . gabapentin (NEURONTIN) 600 MG tablet TAKE ONE TABLET THREE TIMES A DAY FOR BACK PAIN.    . Multiple Vitamin (MULTIVITAMIN WITH MINERALS) TABS tablet Take 1 tablet by mouth daily.    . pantoprazole  (PROTONIX) 40 MG tablet Take 40 mg by mouth daily.     . promethazine (PHENERGAN) 25 MG tablet Take 1 tablet (25 mg total) by mouth every 6 (six) hours as needed (migraine headaches). Take with tramadol 30 tablet 1  . simvastatin (ZOCOR) 40 MG tablet Take 40 mg by mouth at bedtime.    . sodium chloride (OCEAN) 0.65 % SOLN nasal spray Place 2 sprays into both nostrils 3 (three) times daily as needed for congestion.     Marland Kitchen tiotropium (SPIRIVA) 18 MCG inhalation capsule Place 18 mcg into inhaler and  inhale daily.     . traMADol (ULTRAM) 50 MG tablet Take 1 tablet (50 mg total) by mouth every 6 (six) hours as needed (migraine headaches). Take with phenergan 45 tablet 0  . traZODone (DESYREL) 150 MG tablet Take 300 mg by mouth at bedtime.     Marland Kitchen venlafaxine XR (EFFEXOR-XR) 150 MG 24 hr capsule Take 150 mg by mouth daily.    Marland Kitchen albuterol (PROVENTIL HFA;VENTOLIN HFA) 108 (90 Base) MCG/ACT inhaler Inhale 2 puffs into the lungs every 6 (six) hours as needed for wheezing or shortness of breath.    . clonazePAM (KLONOPIN) 0.5 MG tablet TAKE 1 TABLET THREE TIMES A DAY AS NEEDED FOR ANXIETY    . cyclobenzaprine (FLEXERIL) 10 MG tablet TAKE 1 TABLET THREE TIMES A DAY AS NEEDED FOR MUSCLE SPASMS    . fluticasone-salmeterol (ADVAIR HFA) 115-21 MCG/ACT inhaler Inhale 2 puffs into the lungs 2 (two) times daily.    Marland Kitchen gabapentin (NEURONTIN) 300 MG capsule TAKE 1 CAPSULE BY MOUTH THREE TIMES A DAY FOR PAIN  1  . meloxicam (MOBIC) 15 MG tablet Take 15 mg by mouth daily as needed (back pain).   2  . traMADol (ULTRAM) 50 MG tablet Take one tablet three times a day as needed for pain.    . traZODone (DESYREL) 150 MG tablet TAKE 2 TABLETS AT BEDTIME FOR INSOMNIA    . venlafaxine XR (EFFEXOR-XR) 150 MG 24 hr capsule Take by mouth.     No facility-administered medications prior to visit.     PAST MEDICAL HISTORY: Past Medical History:  Diagnosis Date  . Anxiety   . Arthritis   . COPD (chronic obstructive pulmonary  disease) ([place])   . Coronary artery disease   . Depression   . GERD (gastroesophageal reflux disease)   . Headache(784.0)   . Hyperlipidemia   . Incontinent of urine   . Shortness of breath   . Stroke [facility])     PAST SURGICAL HISTORY: Past Surgical History:  Procedure Laterality Date  . CAROTID STENT    . CHOLECYSTECTOMY    . TUBAL LIGATION      FAMILY HISTORY: Family History  Problem Relation Age of Onset  . Migraines Mother   . Heart attack Mother     SOCIAL HISTORY: Social History   Socioeconomic History  . Marital status: Divorced    Spouse name: Not on file  . Number of children: 1  . Years of education: 12th  . Highest education level: Not on file  Occupational History  . Occupation: disabled  Social Needs  . Financial resource strain: Not on file  . Food insecurity:    Worry: Not on file    Inability: Not on file  . Transportation needs:    Medical: Not on file    Non-medical: Not on file  Tobacco Use  . Smoking status: Current Some Day Smoker    Packs/day: 0.50    Years: 40.00    Pack years: 20.00    Types: Cigarettes  . Smokeless tobacco: Never Used  Substance and Sexual Activity  . Alcohol use: No  . Drug use: No  . Sexual activity: Never  Lifestyle  . Physical activity:    Days per week: Not on file    Minutes per session: Not on file  . Stress: Not on file  Relationships  . Social connections:    Talks on phone: Not on file    Gets together: Not on file    Attends  religious service: Not on file    Active member of club or organization: Not on file    Attends meetings of clubs or organizations: Not on file    Relationship status: Not on file  . Intimate partner violence:    Fear of current or ex partner: Not on file    Emotionally abused: Not on file    Physically abused: Not on file    Forced sexual activity: Not on file  Other Topics Concern  . Not on file  Social History Narrative   Patient lives at home alone.   Patient drink  diet coke  (4 daily avg).     PHYSICAL EXAM  Vitals:   07/21/18 0827  BP: 119/74  Pulse: 81  Weight: 270 lb (122.5 kg)  Height: 5\' 6"  (1.676 m)   Body mass index is 43.58 kg/m.  Generalized: Well developed, Obese female in no acute distress , well-groomed Head: normocephalic and atraumatic,.   Neck: Supple, no carotid bruits  Cardiac: Regular rate rhythm, no murmur  Musculoskeletal: No deformity   Skin surgical scar right wrist from carpal tunnel surgery Upper extremity radial pulses unequal with the left radial being quite feeble.  Blood pressure is 154/91 in the right upper extremity and 110/70 in the left upper extremity Neurological examination   Mentation: Alert oriented to time, place, history taking. Attention span and concentration appropriate. Recent and remote memory intact.  Follows all commands speech and language fluent.   Cranial nerve II-XII: .Pupils were equal round reactive to light extraocular movements were full, visual field were full on confrontational test. Facial sensation and strength were normal. hearing was intact to finger rubbing bilaterally. Uvula tongue midline. head turning and shoulder shrug were normal and symmetric.Tongue protrusion into cheek strength was normal. Motor: normal bulk and tone, full strength in the BUE, BLE, except mild left hand weakness  Sensory: normal and symmetric to light touch,  Coordination: finger-nose-finger, heel-to-shin bilaterally, no dysmetria Reflexes: Brachioradialis 2/2, biceps 2/2, triceps 2/2, patellar 2/2, Achilles 2/2, plantar responses were flexor bilaterally. Gait and Station: Rising up from seated position without assistance, normal stance,  moderate stride, good arm swing, smooth turning, able to perform tiptoe, and heel walking without difficulty. Tandem gait is unsteady  DIAGNOSTIC DATA (LABS, IMAGING, TESTING) - I reviewed patient records, labs, notes, testing and imaging myself where available.  Lab  Results  Component Value Date   WBC 6.5 11/11/2015   HGB 14.1 11/11/2015   HCT 41.2 11/12/2015   MCV 87.1 11/11/2015   PLT 177 11/11/2015      Component Value Date/Time   NA 141 11/12/2015 0559   K 3.6 11/12/2015 0559   CL 108 11/12/2015 0559   CO2 28 11/12/2015 0559   GLUCOSE 109 (H) 11/12/2015 0559   BUN 14 11/12/2015 0559   CREATININE 1.03 (H) 11/12/2015 0559   CALCIUM 8.5 (L) 11/12/2015 0559   PROT 6.4 (L) 11/10/2015 1610   ALBUMIN 3.4 (L) 11/10/2015 1610   AST 17 11/10/2015 1610   ALT 18 11/10/2015 1610   ALKPHOS 25 (L) 11/10/2015 1610   BILITOT 0.5 11/10/2015 1610   GFRNONAA 59 (L) 11/12/2015 0559   GFRAA >60 11/12/2015 0559   Lab Results  Component Value Date   CHOL 98 11/11/2015   HDL 35 (L) 11/11/2015   LDLCALC 41 11/11/2015   TRIG 108 11/11/2015   CHOLHDL 2.8 11/11/2015   Lab Results  Component Value Date   HGBA1C 5.9 (H) 11/11/2015   Lab  Results  Component Value Date   UEKCMKLK91 791 11/12/2015   Lab Results  Component Value Date   TSH 4.751 (H) 11/12/2015      ASSESSMENT AND PLAN 55 year lady with 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 She has mild persistent weakness and paresthesias in her left hand and gait difficulty . Moderate terminal right vertebrobasilar junction stenosis. Also new complaint of  Episode of loss of brief loss of consciousness and generalized weakness query vertebrobasilar ischemia versus syncope.  Given prior history of bilateral vertebral artery origin stents restenosis is a consideration. I had a long discussion with the patient with regards to her long-standing symptoms of dizziness and vertigo which is positional with head extension as well as recent episode of unconsciousness followed by brief generalized weakness  raising concern for vertebrobasilar ischemia.  She has remote history of bilateral vertebral artery origin stents in 2011 which may possibly have developed some restenosis.  I recommend we check MRI scan of the brain with MRA of the brain and neck.  Also check EEG for seizure activity.  I advised her to limit her neck extension and to drink plenty of fluids.  Continue Plavix for stroke prevention with strict control of hypertension with blood pressure goal below 1 three 0/9 0, lipids with LDL cholesterol goal below 7 0 mg percent and diabetes with hemoglobin A1c goal below 6.5.  She was also encouraged to eat a healthy diet with lots of fruits vegetables cereals and whole grains and to lose weight and to be active and exercise regularly.  She was advised to follow-up with her orthopedic surgeon Dr. Rolena Infante as well as the pain clinic for her chronic back pain.  Pain extends greater than 50% time during this 30-minute visit was spent on counseling and coordination of care about subclavian steal, vertebral origin stenosis, syncope and answering questions for she will return for follow-up in 3 months or call earlier if necessary.  Antony Contras, MD [facility] [address], [place], [place] [address] [phone]

## 2018-07-27 ENCOUNTER — Other Ambulatory Visit: Payer: PPO

## 2018-08-03 ENCOUNTER — Ambulatory Visit: Payer: PPO | Admitting: Neurology

## 2018-08-03 DIAGNOSIS — R55 Syncope and collapse: Secondary | ICD-10-CM

## 2018-08-03 DIAGNOSIS — I6503 Occlusion and stenosis of bilateral vertebral arteries: Secondary | ICD-10-CM

## 2018-08-03 DIAGNOSIS — G450 Vertebro-basilar artery syndrome: Secondary | ICD-10-CM

## 2018-08-05 ENCOUNTER — Telehealth: Payer: Self-pay

## 2018-08-05 NOTE — Telephone Encounter (Signed)
I called the patient and left her a voicemail stating I did send the orders to GI and to give them a call at [phone] to schedule.

## 2018-08-05 NOTE — Telephone Encounter (Signed)
Rn had just gave pt her EEG results. PT ask about scheduling her 3 scans. She stated no one has call her. Rn stated it was sent to [facility] on 07/21/2018 by Raquel Sarna. RN stated a message will be sent to [facility]. Rn gave pt the phone number for GI to schedule all three scans.

## 2018-08-05 NOTE — Telephone Encounter (Signed)
RN call patient that her EEG was normal. PT verbalized understanding. ------

## 2018-08-05 NOTE — Telephone Encounter (Signed)
-----   Message from Garvin Fila, MD sent at 08/04/2018 12:35 PM EDT ----- Mitchell Heir inform patient that EEG study was normal

## 2018-08-10 DIAGNOSIS — R531 Weakness: Secondary | ICD-10-CM | POA: Diagnosis not present

## 2018-08-10 DIAGNOSIS — R0902 Hypoxemia: Secondary | ICD-10-CM | POA: Diagnosis not present

## 2018-08-19 ENCOUNTER — Ambulatory Visit
Admission: RE | Admit: 2018-08-19 | Discharge: 2018-08-19 | Disposition: A | Discharge status: Home / Self Care | Payer: PPO | Source: Ambulatory Visit | Attending: Neurology | Admitting: Neurology

## 2018-08-19 DIAGNOSIS — G450 Vertebro-basilar artery syndrome: Secondary | ICD-10-CM

## 2018-08-19 DIAGNOSIS — I6503 Occlusion and stenosis of bilateral vertebral arteries: Secondary | ICD-10-CM

## 2018-08-19 MED ORDER — GADOBENATE DIMEGLUMINE 529 MG/ML IV SOLN
20.0000 mL | Freq: Once | INTRAVENOUS | Status: AC | PRN
  Administered 2018-08-19: 20 mL via INTRAVENOUS

## 2018-08-25 ENCOUNTER — Telehealth: Payer: Self-pay | Admitting: Neurology

## 2018-08-25 NOTE — Telephone Encounter (Signed)
Spoke to the patient and gave her results of the MRI scan of the brain showing old strokes on the right but no new strokes. MRA of the brain and neck both show chronic occlusion of both internal carotid arteries in the neck and patent vertebral artery origin stents on both sides with good flow in the vertebral arteries. No new or worrisome findings. No significant change compared with previous CT angiogram done a few years ago. She voiced understanding.

## 2018-08-25 NOTE — Telephone Encounter (Signed)
Pt is asking for a call with results of the MRI,pt is aware that Dr Leonie Man is not in the office this week.

## 2018-11-08 ENCOUNTER — Ambulatory Visit: Payer: PPO | Admitting: Neurology

## 2018-11-08 ENCOUNTER — Telehealth: Payer: Self-pay

## 2018-11-08 NOTE — Telephone Encounter (Signed)
Patient no show for appt today. 

## 2018-11-09 ENCOUNTER — Encounter: Payer: Self-pay | Admitting: Neurology

## 2019-03-25 DIAGNOSIS — J449 Chronic obstructive pulmonary disease, unspecified: Secondary | ICD-10-CM | POA: Diagnosis not present

## 2019-03-25 DIAGNOSIS — E782 Mixed hyperlipidemia: Secondary | ICD-10-CM | POA: Diagnosis not present

## 2019-03-25 DIAGNOSIS — G459 Transient cerebral ischemic attack, unspecified: Secondary | ICD-10-CM | POA: Diagnosis not present

## 2019-03-25 DIAGNOSIS — F411 Generalized anxiety disorder: Secondary | ICD-10-CM | POA: Diagnosis not present

## 2019-03-25 DIAGNOSIS — E039 Hypothyroidism, unspecified: Secondary | ICD-10-CM | POA: Diagnosis not present

## 2019-03-25 DIAGNOSIS — J069 Acute upper respiratory infection, unspecified: Secondary | ICD-10-CM | POA: Diagnosis not present

## 2019-03-25 DIAGNOSIS — K219 Gastro-esophageal reflux disease without esophagitis: Secondary | ICD-10-CM | POA: Diagnosis not present

## 2019-03-25 DIAGNOSIS — J40 Bronchitis, not specified as acute or chronic: Secondary | ICD-10-CM | POA: Diagnosis not present

## 2019-03-25 DIAGNOSIS — E7800 Pure hypercholesterolemia, unspecified: Secondary | ICD-10-CM | POA: Diagnosis not present

## 2019-03-25 DIAGNOSIS — I10 Essential (primary) hypertension: Secondary | ICD-10-CM | POA: Diagnosis not present

## 2019-08-23 DIAGNOSIS — Z6841 Body Mass Index (BMI) 40.0 and over, adult: Secondary | ICD-10-CM | POA: Diagnosis not present

## 2019-08-23 DIAGNOSIS — E66812 Obesity, class 2: Secondary | ICD-10-CM | POA: Insufficient documentation

## 2019-08-23 DIAGNOSIS — J440 Chronic obstructive pulmonary disease with acute lower respiratory infection: Secondary | ICD-10-CM | POA: Diagnosis not present

## 2019-08-23 DIAGNOSIS — J0100 Acute maxillary sinusitis, unspecified: Secondary | ICD-10-CM | POA: Diagnosis not present

## 2019-08-23 DIAGNOSIS — J40 Bronchitis, not specified as acute or chronic: Secondary | ICD-10-CM | POA: Diagnosis not present

## 2019-08-23 DIAGNOSIS — Z23 Encounter for immunization: Secondary | ICD-10-CM | POA: Diagnosis not present

## 2019-08-23 DIAGNOSIS — F411 Generalized anxiety disorder: Secondary | ICD-10-CM | POA: Diagnosis not present

## 2019-08-23 DIAGNOSIS — F17200 Nicotine dependence, unspecified, uncomplicated: Secondary | ICD-10-CM | POA: Diagnosis not present

## 2019-08-23 DIAGNOSIS — G8929 Other chronic pain: Secondary | ICD-10-CM | POA: Diagnosis not present

## 2019-08-23 DIAGNOSIS — M545 Low back pain: Secondary | ICD-10-CM | POA: Diagnosis not present

## 2019-10-07 DIAGNOSIS — J069 Acute upper respiratory infection, unspecified: Secondary | ICD-10-CM | POA: Diagnosis not present

## 2019-10-07 DIAGNOSIS — J0101 Acute recurrent maxillary sinusitis: Secondary | ICD-10-CM | POA: Diagnosis not present

## 2019-12-14 DIAGNOSIS — J449 Chronic obstructive pulmonary disease, unspecified: Secondary | ICD-10-CM | POA: Diagnosis not present

## 2019-12-14 DIAGNOSIS — J0100 Acute maxillary sinusitis, unspecified: Secondary | ICD-10-CM | POA: Diagnosis not present

## 2019-12-14 DIAGNOSIS — F17210 Nicotine dependence, cigarettes, uncomplicated: Secondary | ICD-10-CM | POA: Diagnosis not present

## 2020-03-06 DIAGNOSIS — J441 Chronic obstructive pulmonary disease with (acute) exacerbation: Secondary | ICD-10-CM | POA: Diagnosis not present

## 2020-03-06 DIAGNOSIS — J0141 Acute recurrent pansinusitis: Secondary | ICD-10-CM | POA: Diagnosis not present

## 2020-03-06 DIAGNOSIS — Z20822 Contact with and (suspected) exposure to covid-19: Secondary | ICD-10-CM | POA: Diagnosis not present

## 2020-04-12 DIAGNOSIS — F3341 Major depressive disorder, recurrent, in partial remission: Secondary | ICD-10-CM | POA: Diagnosis not present

## 2020-04-12 DIAGNOSIS — G459 Transient cerebral ischemic attack, unspecified: Secondary | ICD-10-CM | POA: Diagnosis not present

## 2020-04-12 DIAGNOSIS — Z8673 Personal history of transient ischemic attack (TIA), and cerebral infarction without residual deficits: Secondary | ICD-10-CM | POA: Diagnosis not present

## 2020-04-12 DIAGNOSIS — K219 Gastro-esophageal reflux disease without esophagitis: Secondary | ICD-10-CM | POA: Diagnosis not present

## 2020-04-12 DIAGNOSIS — F17200 Nicotine dependence, unspecified, uncomplicated: Secondary | ICD-10-CM | POA: Diagnosis not present

## 2020-04-12 DIAGNOSIS — F411 Generalized anxiety disorder: Secondary | ICD-10-CM | POA: Diagnosis not present

## 2020-04-12 DIAGNOSIS — F5104 Psychophysiologic insomnia: Secondary | ICD-10-CM | POA: Diagnosis not present

## 2020-04-12 DIAGNOSIS — I10 Essential (primary) hypertension: Secondary | ICD-10-CM | POA: Diagnosis not present

## 2020-06-07 ENCOUNTER — Other Ambulatory Visit: Payer: Self-pay | Admitting: Physician Assistant

## 2020-06-07 ENCOUNTER — Ambulatory Visit
Admission: RE | Admit: 2020-06-07 | Discharge: 2020-06-07 | Disposition: A | Discharge status: Home / Self Care | Payer: PPO | Source: Ambulatory Visit | Attending: Physician Assistant | Admitting: Physician Assistant

## 2020-06-07 DIAGNOSIS — M79661 Pain in right lower leg: Secondary | ICD-10-CM | POA: Diagnosis not present

## 2020-06-07 DIAGNOSIS — M7989 Other specified soft tissue disorders: Secondary | ICD-10-CM | POA: Diagnosis not present

## 2020-06-07 DIAGNOSIS — M25461 Effusion, right knee: Secondary | ICD-10-CM | POA: Diagnosis not present

## 2020-06-07 DIAGNOSIS — R600 Localized edema: Secondary | ICD-10-CM | POA: Diagnosis not present

## 2020-06-07 DIAGNOSIS — M660 Rupture of popliteal cyst: Secondary | ICD-10-CM | POA: Diagnosis not present

## 2020-06-25 DIAGNOSIS — M7989 Other specified soft tissue disorders: Secondary | ICD-10-CM | POA: Diagnosis not present

## 2020-06-25 DIAGNOSIS — M660 Rupture of popliteal cyst: Secondary | ICD-10-CM | POA: Diagnosis not present

## 2020-06-25 DIAGNOSIS — M79661 Pain in right lower leg: Secondary | ICD-10-CM | POA: Diagnosis not present

## 2020-06-25 DIAGNOSIS — M25562 Pain in left knee: Secondary | ICD-10-CM | POA: Diagnosis not present

## 2020-06-25 DIAGNOSIS — M25561 Pain in right knee: Secondary | ICD-10-CM | POA: Diagnosis not present

## 2020-07-18 DIAGNOSIS — M25562 Pain in left knee: Secondary | ICD-10-CM | POA: Diagnosis not present

## 2020-07-18 DIAGNOSIS — K219 Gastro-esophageal reflux disease without esophagitis: Secondary | ICD-10-CM | POA: Diagnosis not present

## 2020-07-18 DIAGNOSIS — Z0000 Encounter for general adult medical examination without abnormal findings: Secondary | ICD-10-CM | POA: Diagnosis not present

## 2020-07-18 DIAGNOSIS — E782 Mixed hyperlipidemia: Secondary | ICD-10-CM | POA: Diagnosis not present

## 2020-07-18 DIAGNOSIS — J440 Chronic obstructive pulmonary disease with acute lower respiratory infection: Secondary | ICD-10-CM | POA: Diagnosis not present

## 2020-07-18 DIAGNOSIS — G43009 Migraine without aura, not intractable, without status migrainosus: Secondary | ICD-10-CM | POA: Diagnosis not present

## 2020-07-18 DIAGNOSIS — J4520 Mild intermittent asthma, uncomplicated: Secondary | ICD-10-CM | POA: Diagnosis not present

## 2020-07-18 DIAGNOSIS — E039 Hypothyroidism, unspecified: Secondary | ICD-10-CM | POA: Diagnosis not present

## 2020-07-18 DIAGNOSIS — F411 Generalized anxiety disorder: Secondary | ICD-10-CM | POA: Diagnosis not present

## 2020-07-18 DIAGNOSIS — K089 Disorder of teeth and supporting structures, unspecified: Secondary | ICD-10-CM | POA: Diagnosis not present

## 2020-07-18 DIAGNOSIS — Z23 Encounter for immunization: Secondary | ICD-10-CM | POA: Diagnosis not present

## 2020-07-18 DIAGNOSIS — G8929 Other chronic pain: Secondary | ICD-10-CM | POA: Diagnosis not present

## 2020-07-18 DIAGNOSIS — M25561 Pain in right knee: Secondary | ICD-10-CM | POA: Diagnosis not present

## 2020-07-19 DIAGNOSIS — M25561 Pain in right knee: Secondary | ICD-10-CM | POA: Diagnosis not present

## 2020-07-19 DIAGNOSIS — M25562 Pain in left knee: Secondary | ICD-10-CM | POA: Diagnosis not present

## 2020-07-19 DIAGNOSIS — M170 Bilateral primary osteoarthritis of knee: Secondary | ICD-10-CM | POA: Diagnosis not present

## 2020-07-19 DIAGNOSIS — G8929 Other chronic pain: Secondary | ICD-10-CM | POA: Diagnosis not present

## 2020-07-19 DIAGNOSIS — M1712 Unilateral primary osteoarthritis, left knee: Secondary | ICD-10-CM | POA: Diagnosis not present

## 2020-07-19 DIAGNOSIS — M1711 Unilateral primary osteoarthritis, right knee: Secondary | ICD-10-CM | POA: Diagnosis not present

## 2020-08-02 DIAGNOSIS — Z23 Encounter for immunization: Secondary | ICD-10-CM | POA: Diagnosis not present

## 2020-08-09 DIAGNOSIS — M25561 Pain in right knee: Secondary | ICD-10-CM | POA: Diagnosis not present

## 2020-08-13 ENCOUNTER — Other Ambulatory Visit: Payer: Self-pay | Admitting: Orthopedic Surgery

## 2020-08-13 DIAGNOSIS — M25561 Pain in right knee: Secondary | ICD-10-CM

## 2020-08-13 DIAGNOSIS — R531 Weakness: Secondary | ICD-10-CM

## 2020-09-03 ENCOUNTER — Other Ambulatory Visit: Payer: PPO

## 2020-12-06 ENCOUNTER — Ambulatory Visit: Payer: PPO | Admitting: Neurology

## 2020-12-07 DIAGNOSIS — R519 Headache, unspecified: Secondary | ICD-10-CM | POA: Diagnosis not present

## 2020-12-07 DIAGNOSIS — J020 Streptococcal pharyngitis: Secondary | ICD-10-CM | POA: Diagnosis not present

## 2020-12-07 DIAGNOSIS — R062 Wheezing: Secondary | ICD-10-CM | POA: Diagnosis not present

## 2020-12-07 DIAGNOSIS — J40 Bronchitis, not specified as acute or chronic: Secondary | ICD-10-CM | POA: Diagnosis not present

## 2020-12-07 DIAGNOSIS — J029 Acute pharyngitis, unspecified: Secondary | ICD-10-CM | POA: Diagnosis not present

## 2020-12-07 DIAGNOSIS — R059 Cough, unspecified: Secondary | ICD-10-CM | POA: Diagnosis not present

## 2020-12-07 DIAGNOSIS — H9209 Otalgia, unspecified ear: Secondary | ICD-10-CM | POA: Diagnosis not present

## 2021-01-16 DIAGNOSIS — M545 Low back pain, unspecified: Secondary | ICD-10-CM | POA: Diagnosis not present

## 2021-01-16 DIAGNOSIS — F411 Generalized anxiety disorder: Secondary | ICD-10-CM | POA: Diagnosis not present

## 2021-01-16 DIAGNOSIS — M25562 Pain in left knee: Secondary | ICD-10-CM | POA: Diagnosis not present

## 2021-01-16 DIAGNOSIS — Z8673 Personal history of transient ischemic attack (TIA), and cerebral infarction without residual deficits: Secondary | ICD-10-CM | POA: Diagnosis not present

## 2021-01-16 DIAGNOSIS — F5104 Psychophysiologic insomnia: Secondary | ICD-10-CM | POA: Diagnosis not present

## 2021-01-16 DIAGNOSIS — K219 Gastro-esophageal reflux disease without esophagitis: Secondary | ICD-10-CM | POA: Diagnosis not present

## 2021-01-16 DIAGNOSIS — M25561 Pain in right knee: Secondary | ICD-10-CM | POA: Diagnosis not present

## 2021-03-05 DIAGNOSIS — S025XXA Fracture of tooth (traumatic), initial encounter for closed fracture: Secondary | ICD-10-CM | POA: Diagnosis not present

## 2021-03-05 DIAGNOSIS — J329 Chronic sinusitis, unspecified: Secondary | ICD-10-CM | POA: Diagnosis not present

## 2021-03-05 DIAGNOSIS — J441 Chronic obstructive pulmonary disease with (acute) exacerbation: Secondary | ICD-10-CM | POA: Diagnosis not present

## 2021-03-05 DIAGNOSIS — Z20822 Contact with and (suspected) exposure to covid-19: Secondary | ICD-10-CM | POA: Diagnosis not present

## 2021-05-17 ENCOUNTER — Other Ambulatory Visit: Payer: Self-pay | Admitting: Physician Assistant

## 2021-05-17 DIAGNOSIS — N6321 Unspecified lump in the left breast, upper outer quadrant: Secondary | ICD-10-CM | POA: Diagnosis not present

## 2021-05-17 DIAGNOSIS — R300 Dysuria: Secondary | ICD-10-CM | POA: Diagnosis not present

## 2021-05-17 DIAGNOSIS — N63 Unspecified lump in unspecified breast: Secondary | ICD-10-CM

## 2021-06-19 ENCOUNTER — Ambulatory Visit
Admission: RE | Admit: 2021-06-19 | Discharge: 2021-06-19 | Disposition: A | Discharge status: Home / Self Care | Payer: PPO | Source: Ambulatory Visit | Attending: Physician Assistant | Admitting: Physician Assistant

## 2021-06-19 ENCOUNTER — Other Ambulatory Visit: Payer: Self-pay

## 2021-06-19 ENCOUNTER — Other Ambulatory Visit: Payer: Self-pay | Admitting: Physician Assistant

## 2021-06-19 DIAGNOSIS — N63 Unspecified lump in unspecified breast: Secondary | ICD-10-CM

## 2021-06-19 DIAGNOSIS — R922 Inconclusive mammogram: Secondary | ICD-10-CM | POA: Diagnosis not present

## 2021-06-19 DIAGNOSIS — N6489 Other specified disorders of breast: Secondary | ICD-10-CM

## 2021-06-19 DIAGNOSIS — N6321 Unspecified lump in the left breast, upper outer quadrant: Secondary | ICD-10-CM | POA: Diagnosis not present

## 2021-06-28 ENCOUNTER — Ambulatory Visit
Admission: RE | Admit: 2021-06-28 | Discharge: 2021-06-28 | Disposition: A | Discharge status: Home / Self Care | Payer: PPO | Source: Ambulatory Visit | Attending: Physician Assistant | Admitting: Physician Assistant

## 2021-06-28 ENCOUNTER — Other Ambulatory Visit: Payer: Self-pay | Admitting: Physician Assistant

## 2021-06-28 ENCOUNTER — Other Ambulatory Visit: Payer: Self-pay

## 2021-06-28 DIAGNOSIS — C50412 Malignant neoplasm of upper-outer quadrant of left female breast: Secondary | ICD-10-CM | POA: Diagnosis not present

## 2021-06-28 DIAGNOSIS — D0511 Intraductal carcinoma in situ of right breast: Secondary | ICD-10-CM | POA: Diagnosis not present

## 2021-06-28 DIAGNOSIS — N63 Unspecified lump in unspecified breast: Secondary | ICD-10-CM

## 2021-06-28 DIAGNOSIS — N6011 Diffuse cystic mastopathy of right breast: Secondary | ICD-10-CM | POA: Diagnosis not present

## 2021-06-28 DIAGNOSIS — N6489 Other specified disorders of breast: Secondary | ICD-10-CM | POA: Diagnosis not present

## 2021-06-28 DIAGNOSIS — R928 Other abnormal and inconclusive findings on diagnostic imaging of breast: Secondary | ICD-10-CM | POA: Diagnosis not present

## 2021-06-28 DIAGNOSIS — N6321 Unspecified lump in the left breast, upper outer quadrant: Secondary | ICD-10-CM | POA: Diagnosis not present

## 2021-07-03 ENCOUNTER — Telehealth: Payer: Self-pay | Admitting: Hematology and Oncology

## 2021-07-03 NOTE — Telephone Encounter (Signed)
Spoke to patient to confirm afternoon clinic on 9/14, packet will be mailed

## 2021-07-05 ENCOUNTER — Encounter: Payer: Self-pay | Admitting: *Deleted

## 2021-07-08 ENCOUNTER — Encounter: Payer: Self-pay | Admitting: *Deleted

## 2021-07-08 DIAGNOSIS — D0511 Intraductal carcinoma in situ of right breast: Secondary | ICD-10-CM | POA: Insufficient documentation

## 2021-07-08 DIAGNOSIS — C50412 Malignant neoplasm of upper-outer quadrant of left female breast: Secondary | ICD-10-CM | POA: Insufficient documentation

## 2021-07-08 DIAGNOSIS — Z170 Estrogen receptor positive status [ER+]: Secondary | ICD-10-CM | POA: Insufficient documentation

## 2021-07-09 NOTE — Progress Notes (Signed)
[name] NOTE  Patient Care Team: [name], MD as PCP - General (Family Medicine) [name], RN as Oncology Nurse Navigator [name], RN as Oncology Nurse Navigator [name], MD as Consulting Physician (General Surgery) [name], MD as Consulting Physician (Hematology and Oncology) [name], MD as Consulting Physician (Radiation Oncology)  CHIEF COMPLAINTS/PURPOSE OF CONSULTATION:  Newly diagnosed breast cancer  HISTORY OF PRESENTING ILLNESS:  April [name] 66 y.o. female is here because of recent diagnosis of bilateral breast cancer. She palpated a mass in the left breast. Diagnostic mammogram and [place] on 06/19/2021 showed suspicious 1.7 cm mass in the left breast at the 1 o'clock position and 2 subtle areas of distortion in the upper outer right breast. 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 She presents to the clinic today for initial evaluation and discussion of treatment options.   I reviewed her records extensively and collaborated the history with the patient.  SUMMARY OF ONCOLOGIC HISTORY: Oncology History  Ductal carcinoma in situ (DCIS) of right breast  06/28/2021 Initial Diagnosis   Palpable mass in the left breast. Diagnostic mammogram and [place]: suspicious 1.7 cm mass in the left breast at the 1 o'clock position and 2 subtle areas of distortion in the upper outer right breast.  Left breast biopsy: Grade 3 IDC high grade focal DCIS Her2+/ER+(90%)/PR+(70%) in the left breast, and DCIS involving sclerosing adenosis with calcifications ER+(100%)/PR+(70%) in the right breast.    07/08/2021 Initial Diagnosis   Ductal carcinoma in situ (DCIS) of right breast   Malignant neoplasm of upper-outer quadrant of left breast in female, estrogen receptor  positive ([place])  06/28/2021 Initial Diagnosis   Palpable mass in the left breast. Diagnostic mammogram and [place]: suspicious 1.7 cm mass in the left breast at the 1 o'clock position and 2 subtle areas of distortion in the upper outer right breast.  Left breast biopsy: Grade 3 IDC high grade focal DCIS Her2+/ER+(90%)/PR+(70%) in the left breast, and DCIS involving sclerosing adenosis with calcifications ER+(100%)/PR+(70%) in the right breast.    07/10/2021 Cancer Staging   Staging form: Breast, AJCC 8th Edition - Clinical stage from 07/10/2021: Stage IA (cT1c, cN0, cM0, G3, ER+, PR+, HER2+) - Signed by [name], MD on 07/10/2021 Stage prefix: Initial diagnosis Histologic grading system: 3 grade system     MEDICAL HISTORY:  Past Medical History:  Diagnosis Date   Anxiety    Arthritis    COPD (chronic obstructive pulmonary disease) ([place])    Coronary artery disease    Depression    GERD (gastroesophageal reflux disease)    Headache(784.0)    Hyperlipidemia    Hypertension    Incontinent of urine    Shortness of breath    Stroke [facility])     SURGICAL HISTORY: Past Surgical History:  Procedure Laterality Date   CAROTID STENT     CHOLECYSTECTOMY     ROTATOR CUFF REPAIR     TUBAL LIGATION      SOCIAL HISTORY: Social History   Socioeconomic History   Marital status: Divorced    Spouse name: Not on file   Number of children: 1   Years of education: 12th   Highest education level: Not on file  Occupational History   Occupation: disabled  Tobacco Use   Smoking status: Some Days    Packs/day: 0.50  Years: 40.00    Pack years: 20.00    Types: Cigarettes   Smokeless tobacco: Never  Substance and Sexual Activity   Alcohol use: No   Drug use: No   Sexual activity: Never  Other Topics Concern   Not on file  Social History Narrative   Patient lives at home alone.   Patient drink diet coke  (4 daily avg).   Social Determinants of Health   Financial Resource Strain: Not on  file  Food Insecurity: Not on file  Transportation Needs: Not on file  Physical Activity: Not on file  Stress: Not on file  Social Connections: Not on file  Intimate Partner Violence: Not on file    FAMILY HISTORY: Family History  Problem Relation Age of Onset   Migraines Mother    Heart attack Mother    Breast cancer Cousin     ALLERGIES:  has No Known Allergies.  MEDICATIONS:  Current Outpatient Medications  Medication Sig Dispense Refill   aspirin-acetaminophen-caffeine (EXCEDRIN MIGRAINE) 220-254-27 MG per tablet Take 2 tablets by mouth every 6 (six) hours as needed for migraine.      clonazePAM (KLONOPIN) 0.5 MG tablet TAKE 1 TABLET BY MOUTH TWICE DAILY  5   fluticasone (FLONASE) 50 MCG/ACT nasal spray Place 2 sprays into both nostrils daily as needed for allergies or rhinitis.     Multiple Vitamin (MULTIVITAMIN WITH MINERALS) TABS tablet Take 1 tablet by mouth daily.     pantoprazole (PROTONIX) 40 MG tablet Take 40 mg by mouth daily.      sodium chloride (OCEAN) 0.65 % SOLN nasal spray Place 2 sprays into both nostrils 3 (three) times daily as needed for congestion.      tiotropium (SPIRIVA) 18 MCG inhalation capsule Place 18 mcg into inhaler and inhale daily.     traMADol (ULTRAM) 50 MG tablet Take 1 tablet (50 mg total) by mouth every 6 (six) hours as needed (migraine headaches). Take with phenergan 45 tablet 0   No current facility-administered medications for this visit.    REVIEW OF SYSTEMS:   Constitutional: Denies fevers, chills or abnormal night sweats Eyes: Denies blurriness of vision, double vision or watery eyes Ears, nose, mouth, throat, and face: Denies mucositis or sore throat Respiratory: Denies cough, dyspnea or wheezes Cardiovascular: Denies palpitation, chest discomfort or lower extremity swelling Gastrointestinal:  Denies nausea, heartburn or change in bowel habits Skin: Denies abnormal skin rashes Lymphatics: Denies new lymphadenopathy or easy  bruising Neurological:Denies numbness, tingling or new weaknesses Behavioral/Psych: Mood is stable, no new changes  Breast: Palpable lesion in the left breast All other systems were reviewed with the patient and are negative.  PHYSICAL EXAMINATION: ECOG PERFORMANCE STATUS: 2 - Symptomatic, <50% confined to bed  Vitals:   07/10/21 1400  BP: 118/73  Pulse: 89  Resp: 18  Temp: 97.7 F (36.5 C)  SpO2: 97%   Filed Weights   07/10/21 1400  Weight: 216 lb (98 kg)      LABORATORY DATA:  I have reviewed the data as listed Lab Results  Component Value Date   WBC 6.5 11/11/2015   HGB 14.1 11/11/2015   HCT 41.2 11/12/2015   MCV 87.1 11/11/2015   PLT 177 11/11/2015   Lab Results  Component Value Date   NA 141 11/12/2015   K 3.6 11/12/2015   CL 108 11/12/2015   CO2 28 11/12/2015    RADIOGRAPHIC STUDIES: I have personally reviewed the radiological reports and agreed with the findings in the  report.  ASSESSMENT AND PLAN:  Malignant neoplasm of upper-outer quadrant of left breast in female, estrogen receptor positive ([place]) 06/28/2021:Palpable mass in the left breast. Diagnostic mammogram and [place]: suspicious 1.7 cm mass in the left breast at the 1 o'clock position and 2 subtle areas of distortion in the upper outer right breast.  Left breast biopsy: Grade 3 IDC high grade focal DCIS Her2+/ER+(90%)/PR+(70%) in the left breast, and DCIS involving sclerosing adenosis with calcifications ER+(100%)/PR+(70%) in the right breast.   Pathology and radiology counseling: Discussed with the patient, the details of pathology including the type of breast cancer,the clinical staging, the significance of ER, PR and HER-2/neu receptors and the implications for treatment. After reviewing the pathology in detail, we proceeded to discuss the different treatment options between surgery, radiation, chemotherapy, antiestrogen therapies.  Treatment plan: 1.  bilateral mastectomies with left sentinel lymph  node biopsy 2. Adjuvant chemotherapy with Taxol Herceptin followed by Herceptin maintenance 3.  Followed by adjuvant antiestrogen therapy  Return to clinic after surgery to discuss starting chemotherapy   All questions were answered. The patient knows to call the clinic with any problems, questions or concerns.   Rulon Eisenmenger, MD, MPH 07/10/2021    I, Thana Ates, am acting as scribe for [name], MD.  I have reviewed the above documentation for accuracy and completeness, and I agree with the above.

## 2021-07-10 ENCOUNTER — Other Ambulatory Visit: Payer: Self-pay

## 2021-07-10 ENCOUNTER — Ambulatory Visit: Payer: PPO | Attending: General Surgery | Admitting: Physical Therapy

## 2021-07-10 ENCOUNTER — Encounter: Payer: Self-pay | Admitting: General Practice

## 2021-07-10 ENCOUNTER — Other Ambulatory Visit: Payer: Self-pay | Admitting: *Deleted

## 2021-07-10 ENCOUNTER — Ambulatory Visit
Admission: RE | Admit: 2021-07-10 | Discharge: 2021-07-10 | Disposition: A | Discharge status: Home / Self Care | Payer: PPO | Source: Ambulatory Visit | Attending: Radiation Oncology | Admitting: Radiation Oncology

## 2021-07-10 ENCOUNTER — Encounter: Payer: Self-pay | Admitting: Genetic Counselor

## 2021-07-10 ENCOUNTER — Encounter: Payer: Self-pay | Admitting: *Deleted

## 2021-07-10 ENCOUNTER — Ambulatory Visit (HOSPITAL_BASED_OUTPATIENT_CLINIC_OR_DEPARTMENT_OTHER): Payer: PPO | Admitting: Genetic Counselor

## 2021-07-10 ENCOUNTER — Encounter: Payer: Self-pay | Admitting: Physical Therapy

## 2021-07-10 ENCOUNTER — Inpatient Hospital Stay (HOSPITAL_BASED_OUTPATIENT_CLINIC_OR_DEPARTMENT_OTHER): Payer: PPO | Admitting: Hematology and Oncology

## 2021-07-10 ENCOUNTER — Ambulatory Visit: Payer: Self-pay | Admitting: General Surgery

## 2021-07-10 ENCOUNTER — Inpatient Hospital Stay: Payer: PPO | Attending: Hematology and Oncology

## 2021-07-10 DIAGNOSIS — C50412 Malignant neoplasm of upper-outer quadrant of left female breast: Secondary | ICD-10-CM

## 2021-07-10 DIAGNOSIS — F17210 Nicotine dependence, cigarettes, uncomplicated: Secondary | ICD-10-CM | POA: Insufficient documentation

## 2021-07-10 DIAGNOSIS — Z803 Family history of malignant neoplasm of breast: Secondary | ICD-10-CM | POA: Diagnosis not present

## 2021-07-10 DIAGNOSIS — R293 Abnormal posture: Secondary | ICD-10-CM | POA: Diagnosis not present

## 2021-07-10 DIAGNOSIS — Z808 Family history of malignant neoplasm of other organs or systems: Secondary | ICD-10-CM | POA: Insufficient documentation

## 2021-07-10 DIAGNOSIS — Z170 Estrogen receptor positive status [ER+]: Secondary | ICD-10-CM

## 2021-07-10 DIAGNOSIS — D0511 Intraductal carcinoma in situ of right breast: Secondary | ICD-10-CM

## 2021-07-10 DIAGNOSIS — Z853 Personal history of malignant neoplasm of breast: Secondary | ICD-10-CM | POA: Diagnosis not present

## 2021-07-10 DIAGNOSIS — M25612 Stiffness of left shoulder, not elsewhere classified: Secondary | ICD-10-CM | POA: Insufficient documentation

## 2021-07-10 LAB — CMP (CANCER CENTER ONLY)
ALT: 7 U/L (ref 0–44)
AST: 9 U/L — ABNORMAL LOW (ref 15–41)
Albumin: 3.4 g/dL — ABNORMAL LOW (ref 3.5–5.0)
Alkaline Phosphatase: 34 U/L — ABNORMAL LOW (ref 38–126)
Anion gap: 8 (ref 5–15)
BUN: 8 mg/dL (ref 8–23)
CO2: 32 mmol/L (ref 22–32)
Calcium: 9.4 mg/dL (ref 8.9–10.3)
Chloride: 101 mmol/L (ref 98–111)
Creatinine: 0.95 mg/dL (ref 0.44–1.00)
GFR, Estimated: >60 mL/min (ref >60)
Glucose, Bld: 96 mg/dL (ref 70–99)
Potassium: 3.9 mmol/L (ref 3.5–5.1)
Sodium: 141 mmol/L (ref 135–145)
Total Bilirubin: 0.6 mg/dL (ref 0.3–1.2)
Total Protein: 6.9 g/dL (ref 6.5–8.1)

## 2021-07-10 LAB — CBC WITH DIFFERENTIAL (CANCER CENTER ONLY)
Abs Immature Granulocytes: 0.04 10*3/uL (ref 0.00–0.07)
Basophils Absolute: 0 10*3/uL (ref 0.0–0.1)
Basophils Relative: 0 %
Eosinophils Absolute: 0.2 10*3/uL (ref 0.0–0.5)
Eosinophils Relative: 2 %
HCT: 52.2 % — ABNORMAL HIGH (ref 36.0–46.0)
Hemoglobin: 16.4 g/dL — ABNORMAL HIGH (ref 12.0–15.0)
Immature Granulocytes: 0 %
Lymphocytes Relative: 29 %
Lymphs Abs: 3 10*3/uL (ref 0.7–4.0)
MCH: 27.3 pg (ref 26.0–34.0)
MCHC: 31.4 g/dL (ref 30.0–36.0)
MCV: 87 fL (ref 80.0–100.0)
Monocytes Absolute: 0.6 10*3/uL (ref 0.1–1.0)
Monocytes Relative: 6 %
Neutro Abs: 6.6 10*3/uL (ref 1.7–7.7)
Neutrophils Relative %: 63 %
Platelet Count: 220 10*3/uL (ref 150–400)
RBC: 6 MIL/uL — ABNORMAL HIGH (ref 3.87–5.11)
RDW: 16.2 % — ABNORMAL HIGH (ref 11.5–15.5)
WBC Count: 10.4 10*3/uL (ref 4.0–10.5)
nRBC: 0 % (ref 0.0–0.2)

## 2021-07-10 LAB — GENETIC SCREENING ORDER

## 2021-07-10 NOTE — Progress Notes (Signed)
EFERRING PROVIDER: [name], MD [place], [place] [address]  PRIMARY PROVIDER:  [name], MD  PRIMARY REASON FOR VISIT:  1. Ductal carcinoma in situ (DCIS) of right breast   2. Malignant neoplasm of upper-outer quadrant of left breast in female, estrogen receptor positive ([place])   3. Family history of breast cancer   4. Family history of bone cancer     HISTORY OF PRESENT ILLNESS:   April [name], a 65 y.o. female, was seen for a [place] cancer genetics consultation during the breast multidisciplinary clinic at the request of Dr. Lindi Adie due to a personal and family history of cancer.  April [name] presents to clinic today to discuss the possibility of a hereditary predisposition to cancer, to discuss genetic testing, and to further clarify her future cancer risks, as well as potential cancer risks for family members.   In September 2022, at the age of 14, April [name] was diagnosed with DCIS of the right breast (ER/PR+) and IDC of the left breast (ER/PR/HER2+, triple positive).  CANCER HISTORY:  Oncology History  Ductal carcinoma in situ (DCIS) of right breast  06/28/2021 Initial Diagnosis   Palpable mass in the left breast. Diagnostic mammogram and [place]: suspicious 1.7 cm mass in the left breast at the 1 o'clock position and 2 subtle areas of distortion in the upper outer right breast.  Left breast biopsy: Grade 3 IDC high grade focal DCIS Her2+/ER+(90%)/PR+(70%) in the left breast, and DCIS involving sclerosing adenosis with calcifications ER+(100%)/PR+(70%) in the right breast.    07/08/2021 Initial Diagnosis   Ductal carcinoma in situ (DCIS) of right breast   Malignant neoplasm of upper-outer quadrant of left breast in female, estrogen receptor positive ([place])  06/28/2021 Initial Diagnosis   Palpable mass in the left breast. Diagnostic mammogram and [place]: suspicious 1.7 cm mass in the left breast at the 1 o'clock position and 2 subtle areas of distortion in the upper  outer right breast.  Left breast biopsy: Grade 3 IDC high grade focal DCIS Her2+/ER+(90%)/PR+(70%) in the left breast, and DCIS involving sclerosing adenosis with calcifications ER+(100%)/PR+(70%) in the right breast.    07/10/2021 Cancer Staging   Staging form: Breast, AJCC 8th Edition - Clinical stage from 07/10/2021: Stage IA (cT1c, cN0, cM0, G3, ER+, PR+, HER2+) - Signed by [name], MD on 07/10/2021 Stage prefix: Initial diagnosis Histologic grading system: 3 grade system      RISK FACTORS:  Menarche was at age 10.  First live birth at age 30.  OCP use: yes, unknown length of time, reportedly "not long" because she had her tubes tied Ovaries intact: yes.  Hysterectomy: no.  Menopausal status: postmenopausal.  Colonoscopy: no Mammogram within the last year: yes. Any excessive radiation exposure in the past: no  Past Medical History:  Diagnosis Date   Anxiety    Arthritis    COPD (chronic obstructive pulmonary disease) ([facility])    Coronary artery disease    Depression    GERD (gastroesophageal reflux disease)    Headache(784.0)    Hyperlipidemia    Hypertension    Incontinent of urine    Shortness of breath    Stroke [facility])     Past Surgical History:  Procedure Laterality Date   CAROTID STENT     CHOLECYSTECTOMY     ROTATOR CUFF REPAIR     TUBAL LIGATION      Social History   Socioeconomic History   Marital status: Divorced    Spouse name:  Not on file   Number of children: 1   Years of education: 12th   Highest education level: Not on file  Occupational History   Occupation: disabled  Tobacco Use   Smoking status: Some Days    Packs/day: 0.50    Years: 40.00    Pack years: 20.00    Types: Cigarettes   Smokeless tobacco: Never  Substance and Sexual Activity   Alcohol use: No   Drug use: No   Sexual activity: Never  Other Topics Concern   Not on file  Social History Narrative   Patient lives at home alone.   Patient drink diet coke  (4 daily avg).    Social Determinants of Health   Financial Resource Strain: Not on file  Food Insecurity: Not on file  Transportation Needs: Not on file  Physical Activity: Not on file  Stress: Not on file  Social Connections: Not on file     FAMILY HISTORY:  We obtained a detailed, 4-generation family history.  Significant diagnoses are listed below:  Family History  Problem Relation Age of Onset   Bone cancer Maternal Aunt    Bone cancer Maternal Aunt    Breast cancer Cousin    Breast cancer Cousin       April [name] has 2 maternal aunts with a history of bone cancer and 2 maternal cousins with a history of breast cancer. Of note, the cousins with breast cancer were not children of the aunts with a history of bone cancer nor were they siblings. April [name] has limited information about her paternal family medical history.   April [name] is unaware of previous family history of genetic testing for hereditary cancer risks. There no reported Ashkenazi Jewish ancestry.   GENETIC COUNSELING ASSESSMENT: [name] is a 65 y.o. female with a personal and family history of cancer which is somewhat suggestive of a hereditary cancer syndrome and predisposition to cancer given bilateral breast cancer. We, therefore, discussed and recommended the following at today's visit.   DISCUSSION: We discussed that 5 - 10% of cancer is hereditary, with most cases of hereditary breast cancer associated with mutations in BRCA1/2.  There are other genes that can be associated with hereditary breast cancer syndromes.  Type of cancer risk and level of risk are gene-specific. We discussed that testing is beneficial for several reasons including knowing how to follow individuals after completing their treatment, identifying whether potential treatment options would be beneficial, and understanding if other family members could be at risk for cancer and allowing them to undergo genetic testing.   We reviewed the characteristics,  features and inheritance patterns of hereditary cancer syndromes. We also discussed genetic testing, including the appropriate family members to test, the process of testing, insurance coverage and turn-around-time for results. We discussed the implications of a negative, positive and/or variant of uncertain significant result. In order to get genetic test results in a timely manner so that Ms. Fullenwider can use these genetic test results for surgical decisions, we recommended Ms. Ebey pursue genetic testing for the BRCAplus. Once complete, we recommend Ms. Purewal pursue reflex genetic testing to a more comprehensive gene panel.   Ms. Zahm  was offered a common hereditary cancer panel (47 genes) and an expanded pan-cancer panel (77 genes). Ms. Ganesh was informed of the benefits and limitations of each panel, including that expanded pan-cancer panels contain genes that do not have clear management guidelines at this point in time.  We also discussed that as  the number of genes included on a panel increases, the chances of variants of uncertain significance increases.  After considering the benefits and limitations of each gene panel, Ms. Gesell elected to have [facility] CustomNext+RNA (47 genes). The CustomNext-Cancer+RNAinsight panel offered by [facility] includes sequencing and rearrangement analysis for the following 47 genes:  APC, ATM, AXIN2, BARD1, BMPR1A, BRCA1, BRCA2, BRIP1, CDH1, CDK4, CDKN2A, CHEK2, DICER1, EPCAM, GREM1, HOXB13, MEN1, MLH1, MSH2, MSH3, MSH6, MUTYH, NBN, NF1, NF2, NTHL1, PALB2, PMS2, POLD1, POLE, PTEN, RAD51C, RAD51D, RECQL, RET, SDHA, SDHAF2, SDHB, SDHC, SDHD, SMAD4, SMARCA4, STK11, TP53, TSC1, TSC2, and VHL.  RNA data is routinely analyzed for use in variant interpretation for all genes.   Based on Ms. Dubois's personal and family history of cancer, she meets medical criteria for genetic testing. Despite that she meets criteria, she may still have an out of pocket cost. We discussed  that if her out of pocket cost for testing is over $100, the laboratory should contact them to discuss self-pay prices, patient pay assistance programs, if applicable, and other billing options.   PLAN: After considering the risks, benefits, and limitations, Ms. Mcghie provided informed consent to pursue genetic testing and the blood sample was sent to [facility] for analysis of the BRCAplus with reflex to CustomNext+RNA (47 genes). Results should be available within approximately 1-2 weeks' time, at which point they will be disclosed by telephone to Ms. Broman, as will any additional recommendations warranted by these results. Ms. Paglia will receive a summary of her genetic counseling visit and a copy of her results once available. This information will also be available in Epic.   Ms. Dardis questions were answered to her satisfaction today. Our contact information was provided should additional questions or concerns arise. Thank you for the referral and allowing [place] to share in the care of your patient.   Lucille Passy, MS, [facility] Genetic Counselor [email or web address] (P) [phone]  The patient was seen for a total of 15 minutes in face-to-face genetic counseling. The patient was seen alone.  Drs. Magrinat, Lindi Adie and/or Burr Medico were available to discuss this case as needed.  _______________________________________________________________________ For Office Staff:  Number of people involved in session: 1 Was an Intern/ student involved with case: no

## 2021-07-10 NOTE — Assessment & Plan Note (Signed)
06/28/2021:Palpable mass in the left breast. Diagnostic mammogram and [place]: suspicious 1.7 cm mass in the left breast at the 1 o'clock position and 2 subtle areas of distortion in the upper outer right breast.  Left breast biopsy: Grade 3 IDC high grade focal DCIS Her2+/ER+(90%)/PR+(70%) in the left breast, and DCIS involving sclerosing adenosis with calcifications ER+(100%)/PR+(70%) in the right breast.   Pathology and radiology counseling: Discussed with the patient, the details of pathology including the type of breast cancer,the clinical staging, the significance of ER, PR and HER-2/neu receptors and the implications for treatment. After reviewing the pathology in detail, we proceeded to discuss the different treatment options between surgery, radiation, chemotherapy, antiestrogen therapies.  Treatment plan: 1.  Neoadjuvant chemotherapy with Taxol Herceptin followed by Herceptin maintenance 2. breast conserving surgery with sentinel lymph node biopsy 3.  Adjuvant radiation 4.  Followed by adjuvant antiestrogen therapy  Return to clinic in 2 weeks to start chemo

## 2021-07-10 NOTE — Therapy (Signed)
[place], [place], [address] Phone: [phone]   Fax:  [phone]  Physical Therapy Evaluation  Patient Details  Name: April [name] MRN: [id number] Date of Birth: [birth year] Referring Provider (PT): Dr. Autumn Messing   Encounter Date: 07/10/2021   PT End of Session - 07/10/21 1615     Visit Number 1    Number of Visits 2    Date for PT Re-Evaluation 09/04/21    PT Start Time 7017    PT Stop Time 1453   Also saw pt from [id number] for a total of 23 min   PT Time Calculation (min) 11 min    Activity Tolerance Patient tolerated treatment well    Behavior During Therapy [name] for tasks assessed/performed             Past Medical History:  Diagnosis Date   Anxiety    Arthritis    COPD (chronic obstructive pulmonary disease) ([place])    Coronary artery disease    Depression    GERD (gastroesophageal reflux disease)    Headache(784.0)    Hyperlipidemia    Hypertension    Incontinent of urine    Shortness of breath    Stroke [facility])     Past Surgical History:  Procedure Laterality Date   CAROTID STENT     CHOLECYSTECTOMY     ROTATOR CUFF REPAIR     TUBAL LIGATION      There were no vitals filed for this visit.    Subjective Assessment - 07/10/21 1609     Subjective Patient reports she is here today to be seen by her medical team for her newly diagnosed bilateral breast cancer.    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 The left breast mass measures 1.7 cm and is located in the upper outer quadrant. It is triple positive with a Ki67 of 40%. The right breast DCIS is ER/PR positive. 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 She smokes 1 pack/day.    Patient Stated Goals Reduce lymphedema risk and learn post op ROM HEP    Currently in Pain? Yes     Pain Score 10-Worst pain ever    Pain Location Back    Pain Orientation Lower    Pain Descriptors / Indicators Aching    Pain Type Chronic pain    Pain Onset More than a month ago    Pain Frequency Intermittent    Aggravating Factors  Sitting    Pain Relieving Factors Walking                April [name] PT Assessment - 07/10/21 0001       Assessment   Medical Diagnosis Bilateral breast cancer    Referring Provider (PT) Dr. Autumn Messing    Onset Date/Surgical Date 06/19/21    Hand Dominance Right    Prior Therapy none      Precautions   Precautions Other (comment)    Precaution Comments active cancer      Restrictions   Weight Bearing Restrictions No      Balance Screen   Has the patient fallen in the past 6 months No    Has the patient had a decrease in activity level because of a fear of falling?  No    Is the patient reluctant to leave their home because of a  fear of falling?  No      Home Environment   Living Environment Private residence    Living Arrangements Alone    Available Help at Discharge Family      Prior Function   Level of Independence Independent    Vocation On disability    Leisure She does not exercise      Cognition   Overall Cognitive Status Within Functional Limits for tasks assessed      Posture/Postural Control   Posture/Postural Control Postural limitations    Postural Limitations Rounded Shoulders;Forward head      ROM / Strength   AROM / PROM / Strength AROM;Strength      AROM   Overall AROM Comments Cervical AROM is WNL    AROM Assessment Site Shoulder    Right/Left Shoulder Right;Left    Right Shoulder Extension 51 Degrees    Right Shoulder Flexion 127 Degrees    Right Shoulder ABduction 150 Degrees    Right Shoulder Internal Rotation 48 Degrees    Right Shoulder External Rotation 72 Degrees    Left Shoulder Extension 47 Degrees    Left Shoulder Flexion 99 Degrees    Left Shoulder ABduction 96 Degrees    Left Shoulder  Internal Rotation 59 Degrees    Left Shoulder External Rotation 57 Degrees               LYMPHEDEMA/ONCOLOGY QUESTIONNAIRE - 07/10/21 0001       Type   Cancer Type Bilateral breast cancer      Lymphedema Assessments   Lymphedema Assessments Upper extremities      Right Upper Extremity Lymphedema   10 cm Proximal to Olecranon Process 32.2 cm    Olecranon Process 28 cm    10 cm Proximal to Ulnar Styloid Process 24.6 cm    Just Proximal to Ulnar Styloid Process 17.9 cm    Across Hand at [employer] 19 cm    At [place] of 2nd Digit 6.6 cm      Left Upper Extremity Lymphedema   10 cm Proximal to Olecranon Process 31 cm    Olecranon Process 28.3 cm    10 cm Proximal to Ulnar Styloid Process 23.8 cm    Just Proximal to Ulnar Styloid Process 18.6 cm    Across Hand at [employer] 18.9 cm    At [place] of 2nd Digit 7 cm             L-DEX FLOWSHEETS - 07/10/21 1600       L-DEX LYMPHEDEMA SCREENING   Measurement Type Bilateral    L-DEX MEASUREMENT EXTREMITY Upper Extremity    POSITION  Standing    DOMINANT SIDE Right    BASELINE RIGHT -3.9    BASELINE LEFT -4.2            The patient was assessed using the L-Dex machine today to produce a lymphedema index baseline score. The patient will be reassessed on a regular basis (typically every 3 months) to obtain new L-Dex scores. If the score is > 6.5 points away from his/her baseline score indicating onset of subclinical lymphedema, it will be recommended to wear a compression garment for 4 weeks, 12 hours per day and then be reassessed. If the score continues to be > 6.5 points from baseline at reassessment, we will initiate lymphedema treatment. Assessing in this manner has a 95% rate of preventing clinically significant lymphedema.       April [name] - 07/10/21 0001  Open a tight or new jar Mild difficulty    Do heavy household chores (wash walls, wash floors) Severe difficulty    Carry a shopping bag or  briefcase Moderate difficulty    Wash your back Unable    Use a knife to cut food Severe difficulty    Recreational activities in which you take some force or impact through your arm, shoulder, or hand (golf, hammering, tennis) Severe difficulty    During the past week, to what extent has your arm, shoulder or hand problem interfered with your normal social activities with family, friends, neighbors, or groups? Slightly    During the past week, to what extent has your arm, shoulder or hand problem limited your work or other regular daily activities Slightly    Arm, shoulder, or hand pain. Moderate    Tingling (pins and needles) in your arm, shoulder, or hand None    Difficulty Sleeping Severe difficulty    DASH Score 52.27 %              Objective measurements completed on examination: See above findings.                PT Education - 07/10/21 1615     Education Details Lymphedema education and post op ROM HEP    Person(s) Educated Patient    Methods Explanation;Demonstration;Handout    Comprehension Verbalized understanding;Returned demonstration                 PT Long Term Goals - 07/10/21 1619       PT LONG TERM GOAL #1   Title Patient will demonstrate she has regained full shoulder ROM and function post operatively compared to baselines.    Time 8    Period Weeks    Status New    Target Date 09/04/21             Breast Clinic Goals - 07/10/21 1618       Patient will be able to verbalize understanding of pertinent lymphedema risk reduction practices relevant to her diagnosis specifically related to skin care.   Time 1    Period Days    Status Achieved      Patient will be able to return demonstrate and/or verbalize understanding of the post-op home exercise program related to regaining shoulder range of motion.   Time 1    Period Days    Status Achieved      Patient will be able to verbalize understanding of the importance of attending  the postoperative After Breast Cancer Class for further lymphedema risk reduction education and therapeutic exercise.   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 The left breast mass measures 1.7 cm and is located in the upper outer quadrant. It is triple positive with a Ki67 of 40%. The right breast DCIS is ER/PR positive. 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 She smokes 1 pack/day. Her multidisciplinary medical team met prior to her assessments to determine a recommended treatment plan. She is planning to have a bilateral mastectomy with bilateral sentinel node biopsies, possible reconstruction, and anti-estrogen therapy. She will benefit from a post  op PT reassessment to determine needs and from L-Dex screens every 3 months for 2 years to detect subclinical lymphedema.    Stability/Clinical Decision Making Stable/Uncomplicated    Clinical Decision Making Low    Rehab Potential Excellent    PT Frequency --   Eval and 1 f/u visit   PT Treatment/Interventions ADLs/Self Care Home Management;Therapeutic exercise;Patient/family education    PT Next Visit Plan Will reassess 3-4 weeks post op    PT Home Exercise Plan Post op HEP    Consulted and Agree with Plan of Care Patient             Patient will benefit from skilled therapeutic intervention in order to improve the following deficits and impairments:  Postural dysfunction, Decreased range of motion, Decreased knowledge of precautions, Impaired UE functional use, Pain  Visit Diagnosis: Malignant neoplasm of upper-outer quadrant of left breast in female, estrogen receptor positive ([place]) - Plan: PT plan of care cert/re-cert  Ductal carcinoma in situ (DCIS) of right breast - Plan: PT  plan of care cert/re-cert  Abnormal posture - Plan: PT plan of care cert/re-cert  Stiffness of left shoulder, not elsewhere classified - Plan: PT plan of care cert/re-cert  Patient will follow up at outpatient cancer rehab 3-4 weeks following surgery.  If the patient requires physical therapy at that time, a specific plan will be dictated and sent to the referring physician for approval. The patient was educated today on appropriate basic range of motion exercises to begin post operatively and the importance of attending the After Breast Cancer class following surgery.  Patient was educated today on lymphedema risk reduction practices as it pertains to recommendations that will benefit the patient immediately following surgery.  She verbalized good understanding.      Problem List Patient Active Problem List   Diagnosis Date Noted   Family history of breast cancer 07/10/2021   Family history of bone cancer 07/10/2021   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   Malignant neoplasm of upper-outer quadrant of left breast in female, estrogen receptor positive ([place]) 07/08/2021   Back pain 04/04/2016   Weakness    Confusion    Orthostatic dizziness    Left-sided weakness 11/10/2015   Polycythemia 11/10/2015   Chronic diastolic CHF (congestive heart failure) ([place]) 11/10/2015   Hypercarbia 11/10/2015   Respiratory acidosis 11/10/2015   Paresthesias in right hand 07/11/2015   Migraine without aura 01/30/2014   Occlusion of extracranial carotid artery 01/30/2014   Transient cerebral ischemia 01/30/2014   Headache(784.0) 01/04/2013   Dizziness 01/04/2013   TIA (transient ischemic attack) 01/04/2013   Migraine 01/04/2013   COPD (chronic obstructive pulmonary disease) ([place]) 01/04/2013   Annia Friendly, PT 07/10/21 4:21 PM   [place], [place], [address] Phone: [phone]   Fax:   [phone]  Name: April [name] MRN: [id number] Date of Birth: [birth year]

## 2021-07-10 NOTE — Progress Notes (Signed)
[facility] Psychosocial Distress Screening Spiritual Care  Met with April [name] in [facility] to introduce [facility] team/resources, reviewing distress screen per protocol.  The patient scored a 8 on the Psychosocial Distress Thermometer which indicates severe distress. Also assessed for distress and other psychosocial needs.   ONCBCN DISTRESS SCREENING 07/10/2021  Screening Type Initial Screening  Distress experienced in past week (1-10) 8  Emotional problem type Depression;Nervousness/Anxiety  Spiritual/Religous concerns type Relating to God  Referral to support programs Yes   April [name] notes that attending [facility] helped reduce her distress to a 2. She reports good support from family and church/pastor, "but, most importantly, I know I'm in [facility] hands." Her faith is a key strength for meaning-making and coping.   Follow up needed: Yes.  We plan to follow up by phone in ca 2 weeks. At that time, she may be more clear about whether she would like an Pharmacist, hospital and/or to be added to the [facility] email list.   Pauline Good, [facility] Pager [phone] Voicemail [phone]

## 2021-07-10 NOTE — Patient Instructions (Signed)

## 2021-07-10 NOTE — Progress Notes (Signed)
Radiation Oncology         ([phone]) [phone] ________________________________  Name: April [name]        MRN: [id number]  Date of Service: 07/10/2021 DOB: May 07, 1956  [id number], Jama Flavors, MD  [name], MD     REFERRING PHYSICIAN: [name], MD   DIAGNOSIS: The encounter diagnosis was Malignant neoplasm of upper-outer quadrant of left breast in female, estrogen receptor positive ([place]).   HISTORY OF PRESENT ILLNESS: April [name] is a 65 y.o. female seen in the multidisciplinary breast clinic for a new diagnosis of bilateral  breast cancer. The patient was noted to have a palpable left breast mass in her work up this showed a 1.7 cm mass in the 1:00 position and her axilla was negative for adenopathy. Her right breast  had a right sided distortion x 3 and she had three subtle changes in the breast. No ultrasound correlate was noted.  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 Biopsies of the right breast were CSL in two of them with apocrine hyperplasia, and her third was low grade DCIS that was ER/PR positive. She's seen today to discuss treatment recommendations of her cancer.    PREVIOUS RADIATION THERAPY: No   PAST MEDICAL HISTORY:  Past Medical History:  Diagnosis Date   Anxiety    Arthritis    COPD (chronic obstructive pulmonary disease) ([place])    Coronary artery disease    Depression    GERD (gastroesophageal reflux disease)    Headache(784.0)    Hyperlipidemia    Hypertension    Incontinent of urine    Shortness of breath    Stroke ([place])        PAST SURGICAL HISTORY: Past Surgical History:  Procedure Laterality Date   CAROTID STENT     CHOLECYSTECTOMY     ROTATOR CUFF REPAIR     TUBAL LIGATION       FAMILY HISTORY:  Family History  Problem Relation Age of Onset   Migraines Mother    Heart attack Mother    Breast cancer Cousin      SOCIAL HISTORY:   reports that she has been smoking cigarettes. She has a 20.00 pack-year smoking history. She has never used smokeless tobacco. She reports that she does not drink alcohol and does not use drugs. The patient is divorced and lives in [place], [place].    ALLERGIES: Patient has no known allergies.   MEDICATIONS:  Current Outpatient Medications  Medication Sig Dispense Refill   aspirin-acetaminophen-caffeine (EXCEDRIN MIGRAINE) [id number] MG per tablet Take 2 tablets by mouth every 6 (six) hours as needed for migraine.      clonazePAM (KLONOPIN) 0.5 MG tablet TAKE 1 TABLET BY MOUTH TWICE DAILY  5   fluticasone (FLONASE) 50 MCG/ACT nasal spray Place 2 sprays into both nostrils daily as needed for allergies or rhinitis.     Multiple Vitamin (MULTIVITAMIN WITH MINERALS) TABS tablet Take 1 tablet by mouth daily.     pantoprazole (PROTONIX) 40 MG tablet Take 40 mg by mouth daily.      sodium chloride (OCEAN) 0.65 % SOLN nasal spray Place 2 sprays into both nostrils 3 (three) times daily as needed for congestion.      tiotropium (SPIRIVA) 18 MCG inhalation capsule Place 18 mcg into inhaler and inhale daily.     traMADol (ULTRAM) 50 MG tablet Take 1 tablet (50 mg  total) by mouth every 6 (six) hours as needed (migraine headaches). Take with phenergan 45 tablet 0   No current facility-administered medications for this encounter.     REVIEW OF SYSTEMS: On review of systems, the patient reports that she is doing well overall. She has had pain in her breast especially the right with hematoma. She does have depression and anxiety at baseline and reports she has been under a lot of stress. She does have some deficits with arm movement as a result of her stroke. No other complaints are noted.      PHYSICAL EXAM:  Wt Readings from Last 3 Encounters:  07/10/21 216 lb (98 kg)  07/21/18 270 lb (122.5 kg)  03/30/17 234 lb 9.6 oz (106.4 kg)   Temp Readings from Last 3 Encounters:  07/10/21 97.7 F (36.5 C)  (Tympanic)  11/12/15 98.2 F (36.8 C) (Oral)  12/23/13 98.2 F (36.8 C) (Oral)   BP Readings from Last 3 Encounters:  07/10/21 118/73  07/21/18 119/74  12/03/17 123/81   Pulse Readings from Last 3 Encounters:  07/10/21 89  07/21/18 81  12/03/17 91    In general this is a well appearing caucasian female in no acute distress. She's alert and oriented x4 and appropriate throughout the examination. Cardiopulmonary assessment is negative for acute distress and she exhibits normal effort. Bilateral breast exam is deferred.    ECOG = 1  0 - Asymptomatic (Fully active, able to carry on all predisease activities without restriction)  1 - Symptomatic but completely ambulatory (Restricted in physically strenuous activity but ambulatory and able to carry out work of a light or sedentary nature. For example, light housework, office work)  2 - Symptomatic, <50% in bed during the day (Ambulatory and capable of all self care but unable to carry out any work activities. Up and about more than 50% of waking hours)  3 - Symptomatic, >50% in bed, but not bedbound (Capable of only limited self-care, confined to bed or chair 50% or more of waking hours)  4 - Bedbound (Completely disabled. Cannot carry on any self-care. Totally confined to bed or chair)  5 - Death   Eustace Pen MM, Creech RH, Tormey DC, et al. [phone]). "Toxicity and response criteria of the [facility]". [place] Oncol. 5 (6): 649-55    LABORATORY DATA:  Lab Results  Component Value Date   WBC 6.5 11/11/2015   HGB 14.1 11/11/2015   HCT 41.2 11/12/2015   MCV 87.1 11/11/2015   PLT 177 11/11/2015   Lab Results  Component Value Date   NA 141 11/12/2015   K 3.6 11/12/2015   CL 108 11/12/2015   CO2 28 11/12/2015   Lab Results  Component Value Date   ALT 18 11/10/2015   AST 17 11/10/2015   ALKPHOS 25 (L) 11/10/2015   BILITOT 0.5 11/10/2015      RADIOGRAPHY: US BREAST LTD UNI LEFT INC  AXILLA  Result Date: 06/19/2021 CLINICAL DATA:  65 year old female with a palpable area of concern in the left breast. EXAM: DIGITAL DIAGNOSTIC BILATERAL MAMMOGRAM WITH TOMOSYNTHESIS AND CAD; ULTRASOUND RIGHT BREAST LIMITED; ULTRASOUND LEFT BREAST LIMITED TECHNIQUE: Bilateral digital diagnostic mammography and breast tomosynthesis was performed. The images were evaluated with computer-aided detection.; Targeted ultrasound examination of the right breast was performed; Targeted ultrasound examination of the left breast was performed. COMPARISON:  Previous exams. ACR Breast Density Category b: There are scattered areas of fibroglandular density. FINDINGS: There is a spiculated mass in  the upper outer left breast corresponding to the area palpable concern with associated calcifications measuring approximately 1.8 cm. There are 2 areas of subtle distortion in the upper outer right breast. The more anterior area of distortion may contain an oil cyst. Physical examination of the upper-outer left breast reveals a firm mass at the approximate 1 o'clock position. Targeted ultrasound of the left breast was performed. There is an irregular spiculated hypoechoic mass in the left breast at 1 o'clock 8 cm from nipple measuring 1.7 x 0.8 x 1.7 cm. This corresponds well with the mass seen in the left breast at mammography. No lymphadenopathy seen in the left axilla. Targeted ultrasound of the right breast was performed. No definite sonographic correlate identified for the 2 areas of subtle distortion seen in the upper outer right breast. No lymphadenopathy seen in the right axilla. IMPRESSION: 1. Suspicious palpable 1.7 cm mass in the left breast at the 1 o'clock position. 2. 2 subtle areas of distortion in the upper outer right breast seen on mammography only. RECOMMENDATION: 1. Recommend ultrasound-guided biopsy of the palpable mass in the left breast at the 1 o'clock position. 2. Recommend stereotactic guided biopsy of the 2  areas of subtle distortion seen in the upper outer right breast on mammography only. I have discussed the findings and recommendations with the patient. If applicable, a reminder letter will be sent to the patient regarding the next appointment. BI-RADS CATEGORY  5: Highly suggestive of malignancy. Electronically Signed   By: Everlean Alstrom M.D.   On: 06/19/2021 15:56  US BREAST LTD UNI RIGHT INC AXILLA  Result Date: 06/19/2021 CLINICAL DATA:  65 year old female with a palpable area of concern in the left breast. EXAM: DIGITAL DIAGNOSTIC BILATERAL MAMMOGRAM WITH TOMOSYNTHESIS AND CAD; ULTRASOUND RIGHT BREAST LIMITED; ULTRASOUND LEFT BREAST LIMITED TECHNIQUE: Bilateral digital diagnostic mammography and breast tomosynthesis was performed. The images were evaluated with computer-aided detection.; Targeted ultrasound examination of the right breast was performed; Targeted ultrasound examination of the left breast was performed. COMPARISON:  Previous exams. ACR Breast Density Category b: There are scattered areas of fibroglandular density. FINDINGS: There is a spiculated mass in the upper outer left breast corresponding to the area palpable concern with associated calcifications measuring approximately 1.8 cm. There are 2 areas of subtle distortion in the upper outer right breast. The more anterior area of distortion may contain an oil cyst. Physical examination of the upper-outer left breast reveals a firm mass at the approximate 1 o'clock position. Targeted ultrasound of the left breast was performed. There is an irregular spiculated hypoechoic mass in the left breast at 1 o'clock 8 cm from nipple measuring 1.7 x 0.8 x 1.7 cm. This corresponds well with the mass seen in the left breast at mammography. No lymphadenopathy seen in the left axilla. Targeted ultrasound of the right breast was performed. No definite sonographic correlate identified for the 2 areas of subtle distortion seen in the upper outer right  breast. No lymphadenopathy seen in the right axilla. IMPRESSION: 1. Suspicious palpable 1.7 cm mass in the left breast at the 1 o'clock position. 2. 2 subtle areas of distortion in the upper outer right breast seen on mammography only. RECOMMENDATION: 1. Recommend ultrasound-guided biopsy of the palpable mass in the left breast at the 1 o'clock position. 2. Recommend stereotactic guided biopsy of the 2 areas of subtle distortion seen in the upper outer right breast on mammography only. I have discussed the findings and recommendations with the patient. If applicable, a  reminder letter will be sent to the patient regarding the next appointment. BI-RADS CATEGORY  5: Highly suggestive of malignancy. Electronically Signed   By: Everlean Alstrom M.D.   On: 06/19/2021 15:56  MM DIAG BREAST TOMO BILATERAL  Result Date: 06/19/2021 CLINICAL DATA:  65 year old female with a palpable area of concern in the left breast. EXAM: DIGITAL DIAGNOSTIC BILATERAL MAMMOGRAM WITH TOMOSYNTHESIS AND CAD; ULTRASOUND RIGHT BREAST LIMITED; ULTRASOUND LEFT BREAST LIMITED TECHNIQUE: Bilateral digital diagnostic mammography and breast tomosynthesis was performed. The images were evaluated with computer-aided detection.; Targeted ultrasound examination of the right breast was performed; Targeted ultrasound examination of the left breast was performed. COMPARISON:  Previous exams. ACR Breast Density Category b: There are scattered areas of fibroglandular density. FINDINGS: There is a spiculated mass in the upper outer left breast corresponding to the area palpable concern with associated calcifications measuring approximately 1.8 cm. There are 2 areas of subtle distortion in the upper outer right breast. The more anterior area of distortion may contain an oil cyst. Physical examination of the upper-outer left breast reveals a firm mass at the approximate 1 o'clock position. Targeted ultrasound of the left breast was performed. There is an  irregular spiculated hypoechoic mass in the left breast at 1 o'clock 8 cm from nipple measuring 1.7 x 0.8 x 1.7 cm. This corresponds well with the mass seen in the left breast at mammography. No lymphadenopathy seen in the left axilla. Targeted ultrasound of the right breast was performed. No definite sonographic correlate identified for the 2 areas of subtle distortion seen in the upper outer right breast. No lymphadenopathy seen in the right axilla. IMPRESSION: 1. Suspicious palpable 1.7 cm mass in the left breast at the 1 o'clock position. 2. 2 subtle areas of distortion in the upper outer right breast seen on mammography only. RECOMMENDATION: 1. Recommend ultrasound-guided biopsy of the palpable mass in the left breast at the 1 o'clock position. 2. Recommend stereotactic guided biopsy of the 2 areas of subtle distortion seen in the upper outer right breast on mammography only. I have discussed the findings and recommendations with the patient. If applicable, a reminder letter will be sent to the patient regarding the next appointment. BI-RADS CATEGORY  5: Highly suggestive of malignancy. Electronically Signed   By: Everlean Alstrom M.D.   On: 06/19/2021 15:56  MM CLIP PLACEMENT LEFT  Result Date: 06/28/2021 CLINICAL DATA:  Evaluate placement of biopsy clips following ultrasound-guided LEFT breast biopsy and 3 stereotactic guided RIGHT breast biopsies. EXAM: 3D DIAGNOSTIC BILATERAL MAMMOGRAM POST ULTRASOUND AND STEREOTACTIC BIOPSY COMPARISON:  Previous exam(s). FINDINGS: 3D Mammographic images were obtained following ultrasound guided biopsy of the 1.7 cm UPPER-OUTER LEFT breast mass (RIBBON clip) and 3 stereotactic guided biopsies of UPPER-OUTER RIGHT breast distortion (14 cm from the nipple-X clip, 12 cm from the nipple-COIL clip, and 7 cm from the nipple-RIBBON clip). The RIBBON biopsy marking clip is in expected position at the site of biopsy within the UPPER-OUTER LEFT breast. The X biopsy marking clip is  in expected position at the site of biopsied distortion within the UPPER-OUTER RIGHT breast (14 cm from the nipple). The COIL biopsy marking clip is in expected position at the site of biopsied distortion within the UPPER-OUTER RIGHT breast (12 cm from the nipple). The RIBBON biopsy marking clip is in expected position at the site of biopsied distortion within the UPPER-OUTER RIGHT breast (7 cm from the nipple). IMPRESSION: Appropriate positioning of the RIBBON shaped biopsy marking clip at the site  of biopsy in the UPPER-OUTER LEFT breast. Appropriate positioning of all 3 biopsy clips within the UPPER-OUTER RIGHT breast at the sites of biopsy distortions (X clip-14 cm from the nipple, COIL clip-12 cm from the nipple, RIBBON clip-7 cm from the nipple. Final Assessment: Post Procedure Mammograms for Marker Placement Electronically Signed   By: Margarette Canada M.D.   On: 06/28/2021 10:14  MM CLIP PLACEMENT RIGHT  Result Date: 06/28/2021 CLINICAL DATA:  Evaluate placement of biopsy clips following ultrasound-guided LEFT breast biopsy and 3 stereotactic guided RIGHT breast biopsies. EXAM: 3D DIAGNOSTIC BILATERAL MAMMOGRAM POST ULTRASOUND AND STEREOTACTIC BIOPSY COMPARISON:  Previous exam(s). FINDINGS: 3D Mammographic images were obtained following ultrasound guided biopsy of the 1.7 cm UPPER-OUTER LEFT breast mass (RIBBON clip) and 3 stereotactic guided biopsies of UPPER-OUTER RIGHT breast distortion (14 cm from the nipple-X clip, 12 cm from the nipple-COIL clip, and 7 cm from the nipple-RIBBON clip). The RIBBON biopsy marking clip is in expected position at the site of biopsy within the UPPER-OUTER LEFT breast. The X biopsy marking clip is in expected position at the site of biopsied distortion within the UPPER-OUTER RIGHT breast (14 cm from the nipple). The COIL biopsy marking clip is in expected position at the site of biopsied distortion within the UPPER-OUTER RIGHT breast (12 cm from the nipple). The RIBBON biopsy  marking clip is in expected position at the site of biopsied distortion within the UPPER-OUTER RIGHT breast (7 cm from the nipple). IMPRESSION: Appropriate positioning of the RIBBON shaped biopsy marking clip at the site of biopsy in the UPPER-OUTER LEFT breast. Appropriate positioning of all 3 biopsy clips within the UPPER-OUTER RIGHT breast at the sites of biopsy distortions (X clip-14 cm from the nipple, COIL clip-12 cm from the nipple, RIBBON clip-7 cm from the nipple. Final Assessment: Post Procedure Mammograms for Marker Placement Electronically Signed   By: Margarette Canada M.D.   On: 06/28/2021 10:14  [place] LT BREAST BX W LOC DEV 1ST LESION IMG BX SPEC US GUIDE  Addendum Date: 07/03/2021   ADDENDUM REPORT: 07/02/2021 14:39 ADDENDUM: Pathology revealed GRADE III INVASIVE DUCTAL CARCINOMA WITH CALCIFICATIONS, FOCAL DUCTAL CARCINOMA IN SITU, HIGH-GRADE of the LEFT breast, upper outer, (ribbon clip). This was found to be concordant by Dr. Hassan Rowan. 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 This was found to be concordant by Dr. Hassan Rowan, with excision recommended. Pathology revealed COMPLEX SCLEROSING LESION WITH APOCRINE ATYPIA AND CALCIFICATIONS of the RIGHT breast, upper outer, 12cmfn, (coil clip). This was found to be concordant by Dr. Hassan Rowan, with excision recommended. 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 This was found to be concordant by Dr. Hassan Rowan. Pathology results were discussed with the patient by telephone. The patient reported doing well after the biopsies with tenderness and bruising at the sites, LEFT more than RIGHT. Post biopsy instructions and care were reviewed and questions were answered. The patient was encouraged to call [facility] for any additional concerns. My direct phone number was provided. The patient was referred to [facility] on July 10, 2021. Pathology results reported by Terie Purser, RN on 07/02/2021. Electronically Signed   By: Margarette Canada M.D.   On: 07/02/2021 14:39   Result Date: 07/03/2021  CLINICAL DATA:  65 year old female for tissue sampling of a 1.7 cm UPPER-OUTER LEFT breast mass and 3 areas of distortion within the UPPER-OUTER RIGHT breast (7 cm, 12 cm and 14 cm from the nipple). The patient was initially scheduled for tissue sampling of 2 separate areas of UPPER-OUTER RIGHT breast distortion, but on preprocedure imaging today for the stereotactic guided RIGHT breast biopsies, an additional area of distortion was identified within the UPPER-OUTER RIGHT breast (14 cm from the nipple) and also sampled. EXAM: ULTRASOUND GUIDED LEFT BREAST CORE NEEDLE BIOPSY RIGHT BREAST STEREOTACTIC CORE NEEDLE BIOPSY X 3 COMPARISON:  Previous exams. FINDINGS: The patient and I discussed the procedures of ultrasound-guided and stereotactic-guided biopsies including benefits and alternatives. We discussed the high likelihood of successful procedures. We discussed the risks of the procedures including infection, bleeding, tissue injury, clip migration, and inadequate sampling. Informed written consent was given. The usual time out protocol was performed immediately prior to the procedures. ULTRASOUND GUIDED LEFT BREAST CORE NEEDLE BIOPSY (RIBBON clip): Lesion quadrant: UPPER-OUTER LEFT breast Using sterile technique and 1% Lidocaine with and without epinephrine as local anesthetic, under direct ultrasound visualization, a 12 gauge spring-loaded device was used to perform biopsy of the 1.7 cm mass at the 1 o'clock position of the LEFT breast using a MEDIAL approach. At the conclusion of the procedure a RIBBON tissue marker clip was deployed  into the biopsy cavity. Follow up 2 view mammogram was performed and dictated separately. RIGHT BREAST STEREOTACTIC CORE NEEDLE BIOPSY #1 (UPPER-OUTER RIGHT breast 14 cm from the nipple-X clip): Using sterile technique and 1% Lidocaine with and without epinephrine as local anesthetic, under stereotactic guidance, a 9 gauge vacuum assisted device was used to perform core needle biopsy of UPPER-OUTER RIGHT breast distortion 14 cm from the nipple using a SUPERIOR approach. Specimen radiograph was performed. Lesion quadrant: UPPER-OUTER RIGHT breast At the conclusion of the procedure, an X tissue marker clip was deployed into the biopsy cavity. Follow-up 2-view mammogram was performed and dictated separately. RIGHT BREAST STEREOTACTIC CORE NEEDLE BIOPSY #1 (UPPER-OUTER RIGHT breast 12 cm from the nipple-COIL clip): Using sterile technique and 1% Lidocaine with and without epinephrine as local anesthetic, under stereotactic guidance, a 9 gauge vacuum assisted device was used to perform core needle biopsy of UPPER-OUTER RIGHT breast distortion 12 cm from the nipple using a SUPERIOR approach. Specimen radiograph was performed. Lesion quadrant: UPPER-OUTER RIGHT breast At the conclusion of the procedure, a COIL tissue marker clip was deployed into the biopsy cavity. Follow-up 2-view mammogram was performed and dictated separately. RIGHT BREAST STEREOTACTIC CORE NEEDLE BIOPSY #2 (UPPER-OUTER RIGHT breast distortion 7 cm from the nipple-RIBBON clip): Using sterile technique and 1% Lidocaine with and without epinephrine as local anesthetic, under stereotactic guidance, a 9 gauge vacuum assisted device was used to perform core needle biopsy of UPPER-OUTER RIGHT breast distortion 7 cm from the nipple using a approach. Specimen radiograph was performed. Lesion quadrant: UPPER-OUTER RIGHT breast At the conclusion of the procedure, a RIBBON tissue marker clip was deployed into the biopsy cavity. Follow-up 2-view mammogram was  performed and dictated separately. A total of 15 cc of lidocaine without epinephrine and 20 cc of lidocaine with epinephrine was administered during all procedures. IMPRESSION: 1. Ultrasound-guided biopsy of 1.7 cm UPPER-OUTER LEFT breast mass (RIBBON clip). 2. Stereotactic guided biopsy of UPPER-OUTER RIGHT breast distortion (14 cm from the nipple-X clip). Stereotactic-guided biopsy of UPPER-OUTER RIGHT breast distortion (12 cm from the nipple-COIL clip). 3. Stereotactic guided biopsy of UPPER-OUTER RIGHT  breast distortion (7 cm from the nipple-RIBBON clip). 4. No apparent complications. Electronically Signed: By: Margarette Canada M.D. On: 06/28/2021 09:56  MM RT BREAST BX W LOC DEV 1ST LESION IMAGE BX SPEC STEREO GUIDE  Addendum Date: 07/03/2021   ADDENDUM REPORT: 07/02/2021 14:39 ADDENDUM: Pathology revealed GRADE III INVASIVE DUCTAL CARCINOMA WITH CALCIFICATIONS, FOCAL DUCTAL CARCINOMA IN SITU, HIGH-GRADE of the LEFT breast, upper outer, (ribbon clip). This was found to be concordant by Dr. Hassan Rowan. 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 This was found to be concordant by Dr. Hassan Rowan, with excision recommended. Pathology revealed COMPLEX SCLEROSING LESION WITH APOCRINE ATYPIA AND CALCIFICATIONS of the RIGHT breast, upper outer, 12cmfn, (coil clip). This was found to be concordant by Dr. Hassan Rowan, with excision recommended. 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 This was found to be concordant by Dr. Hassan Rowan. Pathology results were discussed with the patient by telephone. The patient reported doing well after the biopsies with tenderness and bruising at the sites, LEFT more than RIGHT. Post biopsy instructions and care were reviewed and questions were  answered. The patient was encouraged to call [facility] for any additional concerns. My direct phone number was provided. The patient was referred to [facility] at [facility] on July 10, 2021. Pathology results reported by Terie Purser, RN on 07/02/2021. Electronically Signed   By: Margarette Canada M.D.   On: 07/02/2021 14:39   Result Date: 07/03/2021 CLINICAL DATA:  65 year old female for tissue sampling of a 1.7 cm UPPER-OUTER LEFT breast mass and 3 areas of distortion within the UPPER-OUTER RIGHT breast (7 cm, 12 cm and 14 cm from the nipple). The patient was initially scheduled for tissue sampling of 2 separate areas of UPPER-OUTER RIGHT breast distortion, but on preprocedure imaging today for the stereotactic guided RIGHT breast biopsies, an additional area of distortion was identified within the UPPER-OUTER RIGHT breast (14 cm from the nipple) and also sampled. EXAM: ULTRASOUND GUIDED LEFT BREAST CORE NEEDLE BIOPSY RIGHT BREAST STEREOTACTIC CORE NEEDLE BIOPSY X 3 COMPARISON:  Previous exams. FINDINGS: The patient and I discussed the procedures of ultrasound-guided and stereotactic-guided biopsies including benefits and alternatives. We discussed the high likelihood of successful procedures. We discussed the risks of the procedures including infection, bleeding, tissue injury, clip migration, and inadequate sampling. Informed written consent was given. The usual time out protocol was performed immediately prior to the procedures. ULTRASOUND GUIDED LEFT BREAST CORE NEEDLE BIOPSY (RIBBON clip): Lesion quadrant: UPPER-OUTER LEFT breast Using sterile technique and 1% Lidocaine with and without epinephrine as local anesthetic, under direct ultrasound visualization, a 12 gauge spring-loaded device was used to perform biopsy of the 1.7 cm mass at the 1 o'clock position of the LEFT breast using a MEDIAL approach. At the  conclusion of the procedure a RIBBON tissue marker clip was deployed into the biopsy cavity. Follow up 2 view mammogram was performed and dictated separately. RIGHT BREAST STEREOTACTIC CORE NEEDLE BIOPSY #1 (UPPER-OUTER RIGHT breast 14 cm from the nipple-X clip): Using sterile technique and 1% Lidocaine with and without epinephrine as local anesthetic, under stereotactic guidance, a 9 gauge vacuum assisted device was used to perform core needle biopsy of UPPER-OUTER RIGHT breast distortion 14 cm from the nipple using a  SUPERIOR approach. Specimen radiograph was performed. Lesion quadrant: UPPER-OUTER RIGHT breast At the conclusion of the procedure, an X tissue marker clip was deployed into the biopsy cavity. Follow-up 2-view mammogram was performed and dictated separately. RIGHT BREAST STEREOTACTIC CORE NEEDLE BIOPSY #1 (UPPER-OUTER RIGHT breast 12 cm from the nipple-COIL clip): Using sterile technique and 1% Lidocaine with and without epinephrine as local anesthetic, under stereotactic guidance, a 9 gauge vacuum assisted device was used to perform core needle biopsy of UPPER-OUTER RIGHT breast distortion 12 cm from the nipple using a SUPERIOR approach. Specimen radiograph was performed. Lesion quadrant: UPPER-OUTER RIGHT breast At the conclusion of the procedure, a COIL tissue marker clip was deployed into the biopsy cavity. Follow-up 2-view mammogram was performed and dictated separately. RIGHT BREAST STEREOTACTIC CORE NEEDLE BIOPSY #2 (UPPER-OUTER RIGHT breast distortion 7 cm from the nipple-RIBBON clip): Using sterile technique and 1% Lidocaine with and without epinephrine as local anesthetic, under stereotactic guidance, a 9 gauge vacuum assisted device was used to perform core needle biopsy of UPPER-OUTER RIGHT breast distortion 7 cm from the nipple using a approach. Specimen radiograph was performed. Lesion quadrant: UPPER-OUTER RIGHT breast At the conclusion of the procedure, a RIBBON tissue marker clip was  deployed into the biopsy cavity. Follow-up 2-view mammogram was performed and dictated separately. A total of 15 cc of lidocaine without epinephrine and 20 cc of lidocaine with epinephrine was administered during all procedures. IMPRESSION: 1. Ultrasound-guided biopsy of 1.7 cm UPPER-OUTER LEFT breast mass (RIBBON clip). 2. Stereotactic guided biopsy of UPPER-OUTER RIGHT breast distortion (14 cm from the nipple-X clip). Stereotactic-guided biopsy of UPPER-OUTER RIGHT breast distortion (12 cm from the nipple-COIL clip). 3. Stereotactic guided biopsy of UPPER-OUTER RIGHT breast distortion (7 cm from the nipple-RIBBON clip). 4. No apparent complications. Electronically Signed: By: Margarette Canada M.D. 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 This was found to be concordant by Dr. Hassan Rowan. 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 This was found to be concordant by Dr. Hassan Rowan, with excision recommended. Pathology revealed COMPLEX SCLEROSING LESION WITH APOCRINE ATYPIA AND CALCIFICATIONS of the RIGHT breast, upper outer, 12cmfn, (coil clip). This was found to be concordant by Dr. Hassan Rowan, with excision recommended. 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 This was found to be concordant by Dr. Hassan Rowan. Pathology results were discussed with the patient by telephone. The patient reported doing well after the biopsies with tenderness and bruising at the sites, LEFT more than RIGHT. Post  biopsy instructions and care were reviewed and questions were answered. The patient was encouraged to call [facility] for any additional concerns. My direct phone number was provided. The patient was referred to [facility] on July 10, 2021. Pathology results reported by Terie Purser, RN on 07/02/2021. Electronically Signed   By: Margarette Canada M.D.   On: 07/02/2021 14:39   Result Date: 07/03/2021 CLINICAL DATA:  65 year old female for tissue sampling of a 1.7 cm UPPER-OUTER LEFT breast  mass and 3 areas of distortion within the UPPER-OUTER RIGHT breast (7 cm, 12 cm and 14 cm from the nipple). The patient was initially scheduled for tissue sampling of 2 separate areas of UPPER-OUTER RIGHT breast distortion, but on preprocedure imaging today for the stereotactic guided RIGHT breast biopsies, an additional area of distortion was identified within the UPPER-OUTER RIGHT breast (14 cm from the nipple) and also sampled. EXAM: ULTRASOUND GUIDED LEFT BREAST CORE NEEDLE BIOPSY RIGHT BREAST STEREOTACTIC CORE NEEDLE BIOPSY X 3 COMPARISON:  Previous exams. FINDINGS: The patient and I discussed the procedures of ultrasound-guided and stereotactic-guided biopsies including benefits and alternatives. We discussed the high likelihood of successful procedures. We discussed the risks of the procedures including infection, bleeding, tissue injury, clip migration, and inadequate sampling. Informed written consent was given. The usual time out protocol was performed immediately prior to the procedures. ULTRASOUND GUIDED LEFT BREAST CORE NEEDLE BIOPSY (RIBBON clip): Lesion quadrant: UPPER-OUTER LEFT breast Using sterile technique and 1% Lidocaine with and without epinephrine as local anesthetic, under direct ultrasound visualization, a 12 gauge spring-loaded device was used to perform biopsy of the 1.7 cm mass at the 1 o'clock  position of the LEFT breast using a MEDIAL approach. At the conclusion of the procedure a RIBBON tissue marker clip was deployed into the biopsy cavity. Follow up 2 view mammogram was performed and dictated separately. RIGHT BREAST STEREOTACTIC CORE NEEDLE BIOPSY #1 (UPPER-OUTER RIGHT breast 14 cm from the nipple-X clip): Using sterile technique and 1% Lidocaine with and without epinephrine as local anesthetic, under stereotactic guidance, a 9 gauge vacuum assisted device was used to perform core needle biopsy of UPPER-OUTER RIGHT breast distortion 14 cm from the nipple using a SUPERIOR approach. Specimen radiograph was performed. Lesion quadrant: UPPER-OUTER RIGHT breast At the conclusion of the procedure, an X tissue marker clip was deployed into the biopsy cavity. Follow-up 2-view mammogram was performed and dictated separately. RIGHT BREAST STEREOTACTIC CORE NEEDLE BIOPSY #1 (UPPER-OUTER RIGHT breast 12 cm from the nipple-COIL clip): Using sterile technique and 1% Lidocaine with and without epinephrine as local anesthetic, under stereotactic guidance, a 9 gauge vacuum assisted device was used to perform core needle biopsy of UPPER-OUTER RIGHT breast distortion 12 cm from the nipple using a SUPERIOR approach. Specimen radiograph was performed. Lesion quadrant: UPPER-OUTER RIGHT breast At the conclusion of the procedure, a COIL tissue marker clip was deployed into the biopsy cavity. Follow-up 2-view mammogram was performed and dictated separately. RIGHT BREAST STEREOTACTIC CORE NEEDLE BIOPSY #2 (UPPER-OUTER RIGHT breast distortion 7 cm from the nipple-RIBBON clip): Using sterile technique and 1% Lidocaine with and without epinephrine as local anesthetic, under stereotactic guidance, a 9 gauge vacuum assisted device was used to perform core needle biopsy of UPPER-OUTER RIGHT breast distortion 7 cm from the nipple using a approach. Specimen radiograph was performed. Lesion quadrant: UPPER-OUTER RIGHT breast At the  conclusion of the procedure, a RIBBON tissue marker clip was deployed into the biopsy cavity. Follow-up 2-view mammogram was performed and dictated separately. A total of 15 cc of lidocaine without epinephrine and 20 cc of lidocaine with epinephrine was administered during all procedures. IMPRESSION: 1. Ultrasound-guided biopsy of 1.7 cm UPPER-OUTER LEFT breast mass (RIBBON clip). 2. Stereotactic guided biopsy of UPPER-OUTER RIGHT breast distortion (14 cm from the nipple-X clip). Stereotactic-guided biopsy of UPPER-OUTER RIGHT breast distortion (12 cm from the nipple-COIL clip). 3. Stereotactic guided biopsy of UPPER-OUTER RIGHT breast distortion (7 cm from the nipple-RIBBON clip). 4. No apparent complications. Electronically Signed: By:  Margarette Canada M.D. 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 This was found to be concordant by Dr. Hassan Rowan. 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 This was found to be concordant by Dr. Hassan Rowan, with excision recommended. Pathology revealed COMPLEX SCLEROSING LESION WITH APOCRINE ATYPIA AND CALCIFICATIONS of the RIGHT breast, upper outer, 12cmfn, (coil clip). This was found to be concordant by Dr. Hassan Rowan, with excision recommended. 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 This was found to be concordant by Dr. Hassan Rowan. Pathology results were discussed with the patient by telephone. The patient reported doing well after the biopsies with  tenderness and bruising at the sites, LEFT more than RIGHT. Post biopsy instructions and care were reviewed and questions were answered. The patient was encouraged to call [facility] for any additional concerns. My direct phone number was provided. The patient was referred to [facility] on July 10, 2021. Pathology results reported by Terie Purser, RN on 07/02/2021. Electronically Signed   By: Margarette Canada M.D.   On: 07/02/2021 14:39   Result Date: 07/03/2021 CLINICAL DATA:  65 year old female for tissue sampling of a 1.7 cm UPPER-OUTER LEFT breast mass and 3 areas of distortion within the UPPER-OUTER RIGHT breast (7 cm, 12 cm and 14 cm from the nipple). The patient was initially scheduled for tissue sampling of 2 separate areas of UPPER-OUTER RIGHT breast distortion, but on preprocedure imaging today for the stereotactic guided RIGHT breast biopsies, an additional area of distortion was identified within the UPPER-OUTER RIGHT breast (14 cm from the nipple) and also sampled. EXAM: ULTRASOUND GUIDED LEFT BREAST CORE NEEDLE BIOPSY RIGHT BREAST STEREOTACTIC CORE NEEDLE BIOPSY X 3 COMPARISON:  Previous exams. FINDINGS: The patient and I discussed the procedures of ultrasound-guided and stereotactic-guided biopsies including benefits and alternatives. We discussed the high likelihood of successful procedures. We discussed the risks of the procedures including infection, bleeding, tissue injury, clip migration, and inadequate sampling. Informed written consent was given. The usual time out protocol was performed immediately prior to the procedures. ULTRASOUND GUIDED LEFT BREAST CORE NEEDLE BIOPSY (RIBBON clip): Lesion quadrant: UPPER-OUTER LEFT breast Using sterile technique and 1% Lidocaine with and without epinephrine as local anesthetic, under direct ultrasound visualization, a 12 gauge spring-loaded device was  used to perform biopsy of the 1.7 cm mass at the 1 o'clock position of the LEFT breast using a MEDIAL approach. At the conclusion of the procedure a RIBBON tissue marker clip was deployed into the biopsy cavity. Follow up 2 view mammogram was performed and dictated separately. RIGHT BREAST STEREOTACTIC CORE NEEDLE BIOPSY #1 (UPPER-OUTER RIGHT breast 14 cm from the nipple-X clip): Using sterile technique and 1% Lidocaine with and without epinephrine as local anesthetic, under stereotactic guidance, a 9 gauge vacuum assisted device was used to perform core needle biopsy of UPPER-OUTER RIGHT breast distortion 14 cm from the nipple using a SUPERIOR approach. Specimen radiograph was performed. Lesion quadrant: UPPER-OUTER RIGHT breast At the conclusion  of the procedure, an X tissue marker clip was deployed into the biopsy cavity. Follow-up 2-view mammogram was performed and dictated separately. RIGHT BREAST STEREOTACTIC CORE NEEDLE BIOPSY #1 (UPPER-OUTER RIGHT breast 12 cm from the nipple-COIL clip): Using sterile technique and 1% Lidocaine with and without epinephrine as local anesthetic, under stereotactic guidance, a 9 gauge vacuum assisted device was used to perform core needle biopsy of UPPER-OUTER RIGHT breast distortion 12 cm from the nipple using a SUPERIOR approach. Specimen radiograph was performed. Lesion quadrant: UPPER-OUTER RIGHT breast At the conclusion of the procedure, a COIL tissue marker clip was deployed into the biopsy cavity. Follow-up 2-view mammogram was performed and dictated separately. RIGHT BREAST STEREOTACTIC CORE NEEDLE BIOPSY #2 (UPPER-OUTER RIGHT breast distortion 7 cm from the nipple-RIBBON clip): Using sterile technique and 1% Lidocaine with and without epinephrine as local anesthetic, under stereotactic guidance, a 9 gauge vacuum assisted device was used to perform core needle biopsy of UPPER-OUTER RIGHT breast distortion 7 cm from the nipple using a approach. Specimen radiograph was  performed. Lesion quadrant: UPPER-OUTER RIGHT breast At the conclusion of the procedure, a RIBBON tissue marker clip was deployed into the biopsy cavity. Follow-up 2-view mammogram was performed and dictated separately. A total of 15 cc of lidocaine without epinephrine and 20 cc of lidocaine with epinephrine was administered during all procedures. IMPRESSION: 1. Ultrasound-guided biopsy of 1.7 cm UPPER-OUTER LEFT breast mass (RIBBON clip). 2. Stereotactic guided biopsy of UPPER-OUTER RIGHT breast distortion (14 cm from the nipple-X clip). Stereotactic-guided biopsy of UPPER-OUTER RIGHT breast distortion (12 cm from the nipple-COIL clip). 3. Stereotactic guided biopsy of UPPER-OUTER RIGHT breast distortion (7 cm from the nipple-RIBBON clip). 4. No apparent complications. Electronically Signed: By: Margarette Canada M.D. On: 06/28/2021 09:56      IMPRESSION/PLAN: 1. 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 Dr. Lisbeth Renshaw has reviewed her case and today she and I discussed the pathology findings and reviewed the nature of bilateral breast disease. The consensus from the breast conference includes breast conservation with bilateral lumpectomies with left sentinel node biopsy. The patient is actually in favor of bilateral mastectomies and possibly reconstruction. She may not be a candidate for reconstruction given her medical history, but she states she would still consider mastectomies without reconstruction. We did discuss that if she underwent lumpectomies however, she would be offered radiation to reduce risks of recurrence. We discussed the risks, benefits, short, and long term effects of radiotherapy, as well as the curative intent, if she had breast conserving surgery. We will see her back as needed moving forward. 2. Possible genetic predisposition to malignancy. The patient is a candidate for genetic testing given  her personal history. She was offered referral and will meet today with genetics.   In a visit lasting 60 minutes, greater than 50% of the time was spent face to face reviewing her case, as well as in preparation of, discussing, and coordinating the patient's care.     Carola Rhine, [facility]    **Disclaimer: This note was dictated with voice recognition software. Similar sounding words can inadvertently be transcribed and this note may contain transcription errors which may not have been corrected upon publication of note.**

## 2021-07-16 ENCOUNTER — Other Ambulatory Visit: Payer: Self-pay

## 2021-07-16 ENCOUNTER — Ambulatory Visit (INDEPENDENT_AMBULATORY_CARE_PROVIDER_SITE_OTHER): Payer: PPO | Admitting: Plastic Surgery

## 2021-07-16 ENCOUNTER — Encounter: Payer: Self-pay | Admitting: Plastic Surgery

## 2021-07-16 VITALS — BP 142/89 | HR 90 | Temp 98.2°F | Ht 63.0 in | Wt 216.0 lb

## 2021-07-16 DIAGNOSIS — D751 Secondary polycythemia: Secondary | ICD-10-CM

## 2021-07-16 DIAGNOSIS — R600 Localized edema: Secondary | ICD-10-CM

## 2021-07-16 DIAGNOSIS — Z170 Estrogen receptor positive status [ER+]: Secondary | ICD-10-CM

## 2021-07-16 DIAGNOSIS — G459 Transient cerebral ischemic attack, unspecified: Secondary | ICD-10-CM

## 2021-07-16 DIAGNOSIS — D0511 Intraductal carcinoma in situ of right breast: Secondary | ICD-10-CM

## 2021-07-16 DIAGNOSIS — C50412 Malignant neoplasm of upper-outer quadrant of left female breast: Secondary | ICD-10-CM | POA: Diagnosis not present

## 2021-07-16 DIAGNOSIS — J410 Simple chronic bronchitis: Secondary | ICD-10-CM | POA: Diagnosis not present

## 2021-07-16 DIAGNOSIS — I5032 Chronic diastolic (congestive) heart failure: Secondary | ICD-10-CM

## 2021-07-16 DIAGNOSIS — M25611 Stiffness of right shoulder, not elsewhere classified: Secondary | ICD-10-CM

## 2021-07-16 NOTE — Progress Notes (Signed)
Patient ID: April [name], female    DOB: Jul 27, 1956, 65 y.o.   MRN: [id number]   Chief Complaint  Patient presents with   Breast Cancer    The patient is a 65 year old female here for consultation for breast reconstruction.  She is 5 feet 3 inches tall and weighs 216 pounds.  Her body mass index is 38.3 kg/m.  She is not sure what bra size she is as she wears sports bras.  She was recently diagnosed with bilateral breast cancer after a palpable mass was found in the left breast.  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  She is estrogen and progesterone positive and HER2 positive in the left breast.  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  She had a stroke and 2011 and she is on Plavix.  She has bad asthma and COPD.  She smokes at least a half a pack per day.  She says she is trying to quit.  She has an echocardiogram scheduled for next week.  She is hoping to have bilateral mastectomies.  She is planning on chemotherapy.  She is also wanting reconstruction.   Review of Systems  Constitutional:  Positive for activity change.  Eyes: Negative.   Respiratory:  Positive for cough and shortness of breath.   Cardiovascular:  Negative for leg swelling.  Gastrointestinal: Negative.   Endocrine: Negative.   Genitourinary: Negative.   Musculoskeletal: Negative.   Neurological:  Positive for weakness.  Hematological:  Bruises/bleeds easily.   Past Medical History:  Diagnosis Date   Anxiety    Arthritis    COPD (chronic obstructive pulmonary disease) ([place])    Coronary artery disease    Depression    GERD (gastroesophageal reflux disease)    Headache(784.0)    Hyperlipidemia    Hypertension    Incontinent of urine    Shortness of breath    Stroke [facility])     Past Surgical History:  Procedure Laterality Date   CAROTID STENT     CHOLECYSTECTOMY     ROTATOR CUFF REPAIR     TUBAL  LIGATION        Current Outpatient Medications:    clopidogrel (PLAVIX) 75 MG tablet, Take 75 mg by mouth daily., Disp: , Rfl:    trazodone (DESYREL) 300 MG tablet, Take 300 mg by mouth at bedtime., Disp: , Rfl:    aspirin-acetaminophen-caffeine (EXCEDRIN MIGRAINE) 250-250-65 MG per tablet, Take 2 tablets by mouth every 6 (six) hours as needed for migraine. , Disp: , Rfl:    clonazePAM (KLONOPIN) 0.5 MG tablet, TAKE 1 TABLET BY MOUTH TWICE DAILY, Disp: , Rfl: 5   fluticasone (FLONASE) 50 MCG/ACT nasal spray, Place 2 sprays into both nostrils daily as needed for allergies or rhinitis., Disp: , Rfl:    Multiple Vitamin (MULTIVITAMIN WITH MINERALS) TABS tablet, Take 1 tablet by mouth daily., Disp: , Rfl:    pantoprazole (PROTONIX) 40 MG tablet, Take 40 mg by mouth daily. , Disp: , Rfl:    sodium chloride (OCEAN) 0.65 % SOLN nasal spray, Place 2 sprays into both nostrils 3 (three) times daily as needed for congestion. , Disp: , Rfl:    tiotropium (SPIRIVA) 18 MCG inhalation capsule, Place 18 mcg into inhaler and inhale daily., Disp: , Rfl:    traMADol (ULTRAM) 50 MG tablet, Take 1 tablet (50 mg total) by mouth every 6 (six) hours as needed (  migraine headaches). Take with phenergan, Disp: 45 tablet, Rfl: 0   Objective:   Vitals:   07/16/21 1147  BP: (!) 142/89  Pulse: 90  Temp: 98.2 F (36.8 C)  SpO2: 99%    Physical Exam Vitals and nursing note reviewed.  HENT:     Head: Normocephalic and atraumatic.  Cardiovascular:     Rate and Rhythm: Normal rate.     Pulses: Normal pulses.  Pulmonary:     Effort: Pulmonary effort is normal.  Abdominal:     General: Abdomen is flat. There is no distension.     Tenderness: There is no abdominal tenderness.  Musculoskeletal:        General: No swelling or deformity.  Skin:    General: Skin is warm.     Coloration: Skin is not jaundiced.     Findings: Bruising present. No lesion.  Neurological:     Mental Status: She is alert and oriented to  person, place, and time.  Psychiatric:        Mood and Affect: Mood normal.        Behavior: Behavior normal.        Thought Content: Thought content normal.    Assessment & Plan:  Malignant neoplasm of upper-outer quadrant of left breast in female, estrogen receptor positive ([place])  Ductal carcinoma in situ (DCIS) of right breast  TIA (transient ischemic attack)  Chronic diastolic CHF (congestive heart failure) (HCC)  Simple chronic bronchitis (HCC)  Polycythemia  The options for reconstruction we explained to the patient / family for breast reconstruction.  There are two general categories of reconstruction.  We can reconstruction a breast with implants or use the patient's own tissue.  These were further discussed as listed.  Breast reconstruction is an optional procedure and eligibility depends on the full spectrum of the health of the patient and any co-morbidities.  More than one surgery is often needed to complete the reconstruction process.  The process can take three to twelve months to complete.  The breasts will not be identical due to many factors such as rib differences, shoulder asymmetry and treatments such as radiation.  The goal is to get the breasts to look normal and symmetrical in clothes.  Scars are a part of surgery and may fade some in time but will always be present under clothes.  Surgery may be an option on the non-cancer breast to achieve more symmetry.  No matter which procedure is chosen there is always the risk of complications and even failure of the body to heal.  This could result in no breast.    The options for reconstruction include:  1. Placement of a tissue expander with Acellular dermal matrix. When the expander is the desired size surgery is performed to remove the expander and place an implant.  In some cases the implant can be placed without an expander.  2. Autologous reconstruction can include using a muscle or tissue from another area of the body  to create a breast.  3. Combined procedures (ie. latissismus dorsi flap) can be done with an expander / implant placed under the muscle.   The risks, benefits, scars and recovery time were discussed for each of the above. Risks include bleeding, infection, hematoma, seroma, scarring, pain, wound healing complications, flap loss, fat necrosis, capsular contracture, need for implant removal, donor site complications, bulge, hernia, umbilical necrosis, need for urgent reoperation, and need for dressing changes.   The procedure the patient selected / that was  best for the patient, was then discussed in further detail.  Total time: 45 minutes. This includes time spent with the patient during the visit as well as time spent before and after the visit reviewing the chart, documenting the encounter, making phone calls and reviewing studies.   The patient is a very high risk for any kind of reconstruction.  Her heavy smoking and stroke history puts her at higher risk.  Reconstruction is not out of the question.  I think that trying to do a reconstruction while she is smoking and has chemotherapy would have a very high risk for infection.  My recommendation is to go ahead with her mastectomies, stop smoking and finished chemotherapy.  Then come back for reevaluation and possible further discussion for reconstruction.  [place], DO

## 2021-07-17 ENCOUNTER — Telehealth: Payer: Self-pay

## 2021-07-17 NOTE — Telephone Encounter (Signed)
Faxed [facility] referral.

## 2021-07-18 ENCOUNTER — Telehealth: Payer: Self-pay | Admitting: Genetic Counselor

## 2021-07-18 ENCOUNTER — Encounter: Payer: Self-pay | Admitting: *Deleted

## 2021-07-18 ENCOUNTER — Telehealth: Payer: Self-pay | Admitting: *Deleted

## 2021-07-18 NOTE — Telephone Encounter (Signed)
I contacted April [name] to discuss her genetic testing results. The BRCAplus panel through [facility] which analyzed 8 genes was negative. We are still waiting on the additional 39 genes and they should be available within the next 1-2 weeks. We will call April [name] with those results as soon as they are available.   Lucille Passy, MS, [facility] Genetic Counselor [email or web address] (P) [phone]

## 2021-07-18 NOTE — Telephone Encounter (Signed)
Spoke with patient to follow up from [facility] 9/14 and assess navigation needs.  Patient denies any questions or concerns at this time.  Patient informed that she will just have the bilateral mastectomies with no immediate reconstruction and port placement.  Encouraged her to call should anything arise. Patient verbalized understanding.

## 2021-07-19 ENCOUNTER — Telehealth: Payer: Self-pay | Admitting: Hematology and Oncology

## 2021-07-19 NOTE — Telephone Encounter (Signed)
Scheduled per sch msg. Called and left msg. Mailed printout  

## 2021-07-24 ENCOUNTER — Encounter: Payer: Self-pay | Admitting: *Deleted

## 2021-07-24 ENCOUNTER — Other Ambulatory Visit: Payer: Self-pay

## 2021-07-24 ENCOUNTER — Ambulatory Visit (HOSPITAL_COMMUNITY)
Admission: RE | Admit: 2021-07-24 | Discharge: 2021-07-24 | Disposition: A | Discharge status: Home / Self Care | Payer: PPO | Source: Ambulatory Visit | Attending: Hematology and Oncology | Admitting: Hematology and Oncology

## 2021-07-24 DIAGNOSIS — C50412 Malignant neoplasm of upper-outer quadrant of left female breast: Secondary | ICD-10-CM | POA: Insufficient documentation

## 2021-07-24 DIAGNOSIS — E785 Hyperlipidemia, unspecified: Secondary | ICD-10-CM | POA: Insufficient documentation

## 2021-07-24 DIAGNOSIS — Z8673 Personal history of transient ischemic attack (TIA), and cerebral infarction without residual deficits: Secondary | ICD-10-CM | POA: Insufficient documentation

## 2021-07-24 DIAGNOSIS — I10 Essential (primary) hypertension: Secondary | ICD-10-CM | POA: Diagnosis not present

## 2021-07-24 DIAGNOSIS — Z0189 Encounter for other specified special examinations: Secondary | ICD-10-CM | POA: Diagnosis not present

## 2021-07-24 DIAGNOSIS — Z170 Estrogen receptor positive status [ER+]: Secondary | ICD-10-CM | POA: Diagnosis not present

## 2021-07-24 DIAGNOSIS — J449 Chronic obstructive pulmonary disease, unspecified: Secondary | ICD-10-CM | POA: Diagnosis not present

## 2021-07-24 DIAGNOSIS — I2510 Atherosclerotic heart disease of native coronary artery without angina pectoris: Secondary | ICD-10-CM | POA: Diagnosis not present

## 2021-07-24 DIAGNOSIS — Z01818 Encounter for other preprocedural examination: Secondary | ICD-10-CM | POA: Insufficient documentation

## 2021-07-24 LAB — ECHOCARDIOGRAM COMPLETE
Area-P 1/2: 2.17 cm2
S' Lateral: 3.1 cm

## 2021-07-24 NOTE — Progress Notes (Signed)
  Echocardiogram 2D Echocardiogram has been performed.  April [name] 07/24/2021, 11:07 AM

## 2021-07-25 ENCOUNTER — Telehealth: Payer: Self-pay | Admitting: Genetic Counselor

## 2021-07-25 NOTE — Telephone Encounter (Signed)
I contacted [name] to discuss her final genetic test results. She did not answer and I left a voicemail requesting she call me back at [phone].  Lucille Passy, MS, [facility] Genetic Counselor [email or web address] (P) [phone]

## 2021-07-26 ENCOUNTER — Ambulatory Visit: Payer: Self-pay | Admitting: Genetic Counselor

## 2021-07-26 ENCOUNTER — Telehealth: Payer: Self-pay | Admitting: Genetic Counselor

## 2021-07-26 ENCOUNTER — Encounter: Payer: Self-pay | Admitting: Genetic Counselor

## 2021-07-26 DIAGNOSIS — Z170 Estrogen receptor positive status [ER+]: Secondary | ICD-10-CM

## 2021-07-26 DIAGNOSIS — Z1379 Encounter for other screening for genetic and chromosomal anomalies: Secondary | ICD-10-CM | POA: Insufficient documentation

## 2021-07-26 DIAGNOSIS — C50412 Malignant neoplasm of upper-outer quadrant of left female breast: Secondary | ICD-10-CM

## 2021-07-26 NOTE — Progress Notes (Signed)
HPI:   [name] was previously seen in the [place] clinic due to a personal and family history of cancer and concerns regarding a hereditary predisposition to cancer. Please refer to our prior cancer genetics clinic note for more information regarding our discussion, assessment and recommendations, at the time. [name] recent genetic test results were disclosed to her, as were recommendations warranted by these results. These results and recommendations are discussed in more detail below.  CANCER HISTORY:  Oncology History  Ductal carcinoma in situ (DCIS) of right breast  06/28/2021 Initial Diagnosis   Palpable mass in the left breast. Diagnostic mammogram and [place]: suspicious 1.7 cm mass in the left breast at the 1 o'clock position and 2 subtle areas of distortion in the upper outer right breast.  Left breast biopsy: Grade 3 IDC high grade focal DCIS Her2+/ER+(90%)/PR+(70%) in the left breast, and DCIS involving sclerosing adenosis with calcifications ER+(100%)/PR+(70%) in the right breast.    07/08/2021 Initial Diagnosis   Ductal carcinoma in situ (DCIS) of right breast   Malignant neoplasm of upper-outer quadrant of left breast in female, estrogen receptor positive ([place])  06/28/2021 Initial Diagnosis   Palpable mass in the left breast. Diagnostic mammogram and [place]: suspicious 1.7 cm mass in the left breast at the 1 o'clock position and 2 subtle areas of distortion in the upper outer right breast.  Left breast biopsy: Grade 3 IDC high grade focal DCIS Her2+/ER+(90%)/PR+(70%) in the left breast, and DCIS involving sclerosing adenosis with calcifications ER+(100%)/PR+(70%) in the right breast.    07/10/2021 Cancer Staging   Staging form: Breast, AJCC 8th Edition - Clinical stage from 07/10/2021: Stage IA (cT1c, cN0, cM0, G3, ER+, PR+, HER2+) - Signed by [name], MD on 07/10/2021 Stage prefix: Initial diagnosis Histologic grading system: 3 grade system    FAMILY HISTORY:   We obtained a detailed, 4-generation family history.  Significant diagnoses are listed below:        Family History  Problem Relation Age of Onset   Bone cancer Maternal Aunt     Bone cancer Maternal Aunt     Breast cancer Cousin     Breast cancer Cousin          [name] has 2 maternal aunts with a history of bone cancer and 2 maternal cousins with a history of breast cancer. Of note, the cousins with breast cancer were not children of the aunts with a history of bone cancer nor were they siblings. [name] has limited information about her paternal family medical history.    [name] is unaware of previous family history of genetic testing for hereditary cancer risks. There no reported Ashkenazi Jewish ancestry.      GENETIC TEST RESULTS:  The [facility] CustomNext Panel found no pathogenic mutations. Therefore, this is a negative result.  The CustomNext-Cancer+RNAinsight panel offered by [facility] includes sequencing and rearrangement analysis for the following 47 genes:  APC, ATM, AXIN2, BARD1, BMPR1A, BRCA1, BRCA2, BRIP1, CDH1, CDK4, CDKN2A, CHEK2, DICER1, EPCAM, GREM1, HOXB13, MEN1, MLH1, MSH2, MSH3, MSH6, MUTYH, NBN, NF1, NF2, NTHL1, PALB2, PMS2, POLD1, POLE, PTEN, RAD51C, RAD51D, RECQL, RET, SDHA, SDHAF2, SDHB, SDHC, SDHD, SMAD4, SMARCA4, STK11, TP53, TSC1, TSC2, and VHL.  RNA data is routinely analyzed for use in variant interpretation for all genes.   The test report has been scanned into EPIC and is located under the Molecular Pathology section of the Results Review tab.  A portion of the result report is included below  for reference. Genetic testing reported out on 07/23/2021.         Even though a pathogenic variant was not identified, possible explanations for the cancer in the family may include: There may be no hereditary risk for cancer in the family. The cancers in [name] and/or her family may be due to other genetic or environmental factors. There may be  a gene mutation in one of these genes that current testing methods cannot detect, but that chance is small. There could be another gene that has not yet been discovered, or that we have not yet tested, that is responsible for the cancer diagnoses in the family.  It is also possible there is a hereditary cause for the cancer in the family that [name] did not inherit.  Therefore, it is important to remain in touch with cancer genetics in the future so that we can continue to offer [name] the most up to date genetic testing.   ADDITIONAL GENETIC TESTING:  We discussed with [name] that her genetic testing was fairly extensive.  If there are genes identified to increase cancer risk that can be analyzed in the future, we would be happy to discuss and coordinate this testing at that time.    CANCER SCREENING RECOMMENDATIONS:  [name] test result is considered negative (normal).  This means that we have not identified a hereditary cause for her personal and family history of cancer at this time. Most cancers happen by chance and this negative test suggests that her cancer may fall into this category.    An individual's cancer risk and medical management are not determined by genetic test results alone. Overall cancer risk assessment incorporates additional factors, including personal medical history, family history, and any available genetic information that may result in a personalized plan for cancer prevention and surveillance. Therefore, it is recommended she continue to follow the cancer management and screening guidelines provided by her oncology and primary healthcare provider.  RECOMMENDATIONS FOR FAMILY MEMBERS:   Since she did not inherit a mutation in a cancer predisposition gene included on this panel, her son could not have inherited a mutation from her in one of these genes. Individuals in this family might be at some increased risk of developing cancer, over the general  population risk, due to the family history of cancer.  We recommend women in this family have a yearly mammogram beginning at age 11, or 37 years younger than the earliest onset of cancer, an annual clinical breast exam, and perform monthly breast self-exams.  Other members of the family may still carry a pathogenic variant in one of these genes that [name] did not inherit. Based on the family history, we recommend individuals with a personal history of cancer consider genetic counseling and testing.    FOLLOW-UP:  Lastly, cancer genetics is a rapidly advancing field and it is possible that new genetic tests will be appropriate for [name] and/or her family members in the future. We encourage her to remain in contact with cancer genetics on an annual basis so we can update her personal and family histories and let her know of advances in cancer genetics that may benefit this family.   Our contact number was provided. [name] questions were answered to her satisfaction, and she knows she is welcome to call us at anytime with additional questions or concerns.   Lucille Passy, MS, [facility] Genetic Counselor [email or web address] (P) [phone]

## 2021-07-26 NOTE — Telephone Encounter (Signed)
I contacted Ms. [place] to discuss her genetic testing results. Additional genes were analyze with a total of 47 genes analyzed and no pathogenic variants were identified. Therefore, her final result is negative. There will be a detailed clinic note to follow and results can be found in the media tab.  Lucille Passy, MS, [facility] Genetic Counselor [email or web address] (P) [phone]

## 2021-07-31 ENCOUNTER — Other Ambulatory Visit: Payer: Self-pay | Admitting: *Deleted

## 2021-07-31 MED ORDER — NICOTINE 21 MG/24HR TD PT24: 21.0000 mg | MEDICATED_PATCH | Freq: Every day | TRANSDERMAL | 0 refills | Status: AC

## 2021-07-31 MED ORDER — NICOTINE 7 MG/24HR TD PT24: 7.0000 mg | MEDICATED_PATCH | Freq: Every day | TRANSDERMAL | 0 refills | Status: DC

## 2021-07-31 MED ORDER — NICOTINE 14 MG/24HR TD PT24: 14.0000 mg | MEDICATED_PATCH | Freq: Every day | TRANSDERMAL | 0 refills | Status: AC

## 2021-07-31 NOTE — Progress Notes (Signed)
Received call from pt requesting prescription for nicotine patch to help pt stop smoking.  Per MD pt to be prescribed Nicoderm 21 mg daily patch x 14 days then decrease to Nicoderm 14 mg daily patch x 14 days and then decrease to Nicoderm 7 mg x 28 days and at that point pt will complete treatment.  Pt educated and verbalized understanding. Prescriptions sent to pharmacy on file.

## 2021-08-08 DIAGNOSIS — J4520 Mild intermittent asthma, uncomplicated: Secondary | ICD-10-CM | POA: Diagnosis not present

## 2021-08-08 DIAGNOSIS — F411 Generalized anxiety disorder: Secondary | ICD-10-CM | POA: Diagnosis not present

## 2021-08-08 DIAGNOSIS — Z0000 Encounter for general adult medical examination without abnormal findings: Secondary | ICD-10-CM | POA: Diagnosis not present

## 2021-08-08 DIAGNOSIS — I10 Essential (primary) hypertension: Secondary | ICD-10-CM | POA: Diagnosis not present

## 2021-08-08 DIAGNOSIS — F3341 Major depressive disorder, recurrent, in partial remission: Secondary | ICD-10-CM | POA: Diagnosis not present

## 2021-08-08 DIAGNOSIS — Z23 Encounter for immunization: Secondary | ICD-10-CM | POA: Diagnosis not present

## 2021-08-08 DIAGNOSIS — C50919 Malignant neoplasm of unspecified site of unspecified female breast: Secondary | ICD-10-CM | POA: Diagnosis not present

## 2021-08-08 DIAGNOSIS — J431 Panlobular emphysema: Secondary | ICD-10-CM | POA: Diagnosis not present

## 2021-08-08 DIAGNOSIS — F17200 Nicotine dependence, unspecified, uncomplicated: Secondary | ICD-10-CM | POA: Diagnosis not present

## 2021-08-14 NOTE — Progress Notes (Signed)
Surgical Instructions    Your procedure is scheduled on Monday October 24th.  Report to [facility] Main Entrance "A" at 8:30 A.M., then check in with the Admitting office.  Call this number if you have problems the morning of surgery:  [phone]   If you have any questions prior to your surgery date call [phone]: Open Monday-Friday 8am-4pm    Remember:  Do not eat after midnight the night before your surgery  You may drink clear liquids until 7:30am the morning of your surgery.   Clear liquids allowed are: Water, Non-Citrus Juices (without pulp), Carbonated Beverages, Clear Tea, Black Coffee ONLY (NO MILK, CREAM OR POWDERED CREAMER of any kind), and Gatorade    Take these medicines the morning of surgery with A SIP OF WATER clonazePAM (KLONOPIN) 0.5 MG tablet pantoprazole (PROTONIX) 40 MG tablet simvastatin (ZOCOR) 40 MG tablet tiotropium (SPIRIVA) 18 MCG inhalation capsule please bring with you to the hospital venlafaxine XR (EFFEXOR-XR) 150 MG 24 hr capsule varenicline (CHANTIX) 0.5 MG tablet  IF NEEDED  acetaminophen (TYLENOL) 500 MG tablet albuterol (VENTOLIN HFA) 108 (90 Base) MCG/ACT inhaler please bring with you to the hospital fluticasone (FLONASE) 50 MCG/ACT nasal spray HYDROcodone-acetaminophen (NORCO/VICODIN) 5-325 MG tablet promethazine (PHENERGAN) 25 MG tablet sodium chloride (OCEAN) 0.65 % SOLN nasal spray  Follow your surgeon's instructions on when to stop Plavix.  If no instructions were given by your surgeon then you will need to call the office to get those instructions.     As of today, STOP taking any Excedrin migraine, Aspirin (unless otherwise instructed by your surgeon) Aleve, Naproxen, Ibuprofen, Motrin, Advil, Goody's, BC's, all herbal medications, fish oil, and all vitamins.     After your COVID test   You are not required to quarantine however you are required to wear a well-fitting mask when you are out and around people not in your  household.  If your mask becomes wet or soiled, replace with a new one.  Wash your hands often with soap and water for 20 seconds or clean your hands with an alcohol-based hand sanitizer that contains at least 60% alcohol.  Do not share personal items.  Notify your provider: if you are in close contact with someone who has COVID  or if you develop a fever of 100.4 or greater, sneezing, cough, sore throat, shortness of breath or body aches.             Do not wear jewelry or makeup Do not wear lotions, powders, perfumes, or deodorant. Do not shave 48 hours prior to surgery.   Do not bring valuables to the hospital. DO Not wear nail polish, gel polish, artificial nails, or any other type of covering on natural nails including finger and toenails. If patients have artificial nails, gel coating, etc. that need to be removed by a nail salon, please have this removed prior to surgery or surgery may need to be canceled/delayed if the surgeon/ anesthesia feels like the patient is unable to be adequately monitored.             [facility] is not responsible for any belongings or valuables.  Do NOT Smoke (Tobacco/Vaping)  24 hours prior to your procedure  If you use a CPAP at night, you may bring your mask for your overnight stay.   Contacts, glasses, hearing aids, dentures or partials may not be worn into surgery, please bring cases for these belongings   For patients admitted to the hospital, discharge time will  be determined by your treatment team.   Patients discharged the day of surgery will not be allowed to drive home, and someone needs to stay with them for 24 hours.  NO VISITORS WILL BE ALLOWED IN PRE-OP WHERE PATIENTS ARE PREPPED FOR SURGERY.  ONLY 1 SUPPORT PERSON MAY BE PRESENT IN THE WAITING ROOM WHILE YOU ARE IN SURGERY.  IF YOU ARE TO BE ADMITTED, ONCE YOU ARE IN YOUR ROOM YOU WILL BE ALLOWED TWO (2) VISITORS. 1 (ONE) VISITOR MAY STAY OVERNIGHT BUT MUST ARRIVE TO THE ROOM BY 8pm.   Minor children may have two parents present. Special consideration for safety and communication needs will be reviewed on a case by case basis.  Special instructions:    Oral Hygiene is also important to reduce your risk of infection.  Remember - BRUSH YOUR TEETH THE MORNING OF SURGERY WITH YOUR REGULAR TOOTHPASTE   [facility]- Preparing For Surgery  Before surgery, you can play an important role. Because skin is not sterile, your skin needs to be as free of germs as possible. You can reduce the number of germs on your skin by washing with CHG (chlorahexidine gluconate) Soap before surgery.  CHG is an antiseptic cleaner which kills germs and bonds with the skin to continue killing germs even after washing.     Please do not use if you have an allergy to CHG or antibacterial soaps. If your skin becomes reddened/irritated stop using the CHG.  Do not shave (including legs and underarms) for at least 48 hours prior to first CHG shower. It is OK to shave your face.  Please follow these instructions carefully.     Shower the NIGHT BEFORE SURGERY and the MORNING OF SURGERY with CHG Soap.   If you chose to wash your hair, wash your hair first as usual with your normal shampoo. After you shampoo, rinse your hair and body thoroughly to remove the shampoo.  Then [employer] and genitals (private parts) with your normal soap and rinse thoroughly to remove soap.  After that Use CHG Soap as you would any other liquid soap. You can apply CHG directly to the skin and wash gently with a scrungie or a clean washcloth.   Apply the CHG Soap to your body ONLY FROM THE NECK DOWN.  Do not use on open wounds or open sores. Avoid contact with your eyes, ears, mouth and genitals (private parts). Wash Face and genitals (private parts)  with your normal soap.   Wash thoroughly, paying special attention to the area where your surgery will be performed.  Thoroughly rinse your body with warm water from the neck  down.  DO NOT shower/wash with your normal soap after using and rinsing off the CHG Soap.  Pat yourself dry with a CLEAN TOWEL.  Wear CLEAN PAJAMAS to bed the night before surgery  Place CLEAN SHEETS on your bed the night before your surgery  DO NOT SLEEP WITH PETS.   Day of Surgery:  Take a shower with CHG soap. Wear Clean/Comfortable clothing the morning of surgery Do not apply any deodorants/lotions.   Remember to brush your teeth WITH YOUR REGULAR TOOTHPASTE.   Please read over the following fact sheets that you were given.

## 2021-08-15 ENCOUNTER — Encounter (HOSPITAL_COMMUNITY): Payer: Self-pay

## 2021-08-15 ENCOUNTER — Other Ambulatory Visit: Payer: Self-pay

## 2021-08-15 ENCOUNTER — Encounter (HOSPITAL_COMMUNITY)
Admission: RE | Admit: 2021-08-15 | Discharge: 2021-08-15 | Disposition: A | Discharge status: Home / Self Care | Payer: PPO | Source: Ambulatory Visit | Attending: General Surgery | Admitting: General Surgery

## 2021-08-15 VITALS — BP 136/73 | HR 90 | Temp 97.5°F | Resp 18 | Ht 63.0 in | Wt 212.6 lb

## 2021-08-15 DIAGNOSIS — I2510 Atherosclerotic heart disease of native coronary artery without angina pectoris: Secondary | ICD-10-CM | POA: Diagnosis not present

## 2021-08-15 DIAGNOSIS — Z20822 Contact with and (suspected) exposure to covid-19: Secondary | ICD-10-CM | POA: Insufficient documentation

## 2021-08-15 DIAGNOSIS — Z01818 Encounter for other preprocedural examination: Secondary | ICD-10-CM | POA: Diagnosis not present

## 2021-08-15 DIAGNOSIS — C50911 Malignant neoplasm of unspecified site of right female breast: Secondary | ICD-10-CM | POA: Diagnosis not present

## 2021-08-15 DIAGNOSIS — C50912 Malignant neoplasm of unspecified site of left female breast: Secondary | ICD-10-CM | POA: Diagnosis not present

## 2021-08-15 HISTORY — DX: Malignant (primary) neoplasm, unspecified: C80.1

## 2021-08-15 LAB — BASIC METABOLIC PANEL
Anion gap: 6 (ref 5–15)
BUN: 9 mg/dL (ref 8–23)
CO2: 34 mmol/L — ABNORMAL HIGH (ref 22–32)
Calcium: 9 mg/dL (ref 8.9–10.3)
Chloride: 98 mmol/L (ref 98–111)
Creatinine, Ser: 0.94 mg/dL (ref 0.44–1.00)
GFR, Estimated: >60 mL/min (ref >60)
Glucose, Bld: 128 mg/dL — ABNORMAL HIGH (ref 70–99)
Potassium: 4.7 mmol/L (ref 3.5–5.1)
Sodium: 138 mmol/L (ref 135–145)

## 2021-08-15 LAB — CBC
HCT: 55.6 % — ABNORMAL HIGH (ref 36.0–46.0)
Hemoglobin: 17.3 g/dL — ABNORMAL HIGH (ref 12.0–15.0)
MCH: 27.7 pg (ref 26.0–34.0)
MCHC: 31.1 g/dL (ref 30.0–36.0)
MCV: 89 fL (ref 80.0–100.0)
Platelets: 182 10*3/uL (ref 150–400)
RBC: 6.25 MIL/uL — ABNORMAL HIGH (ref 3.87–5.11)
RDW: 15.4 % (ref 11.5–15.5)
WBC: 9 10*3/uL (ref 4.0–10.5)
nRBC: 0 % (ref 0.0–0.2)

## 2021-08-15 NOTE — Progress Notes (Signed)
PCP - Kathryne Eriksson MD Cardiologist - denies  PPM/ICD - denies Device Orders -  Rep Notified -   Chest x-ray - 12/07/20 EKG - 08/15/21 Stress Test - none ECHO - 07/24/21 Cardiac Cath - none  Sleep Study - no CPAP - no  Fasting Blood Sugar - n/a Checks Blood Sugar _____ times a day  Blood Thinner Instructions:pt states last dose of Plavix was 10/18 per Dr. Ethlyn Gallery instructions. Aspirin Instructions:n/a  ERAS Protcol - clear liquids until 0730 PRE-SURGERY Ensure or G2- no  COVID TEST- 08/15/21   Anesthesia review: yes-cardiac disease  Patient denies shortness of breath, fever, cough and chest pain at PAT appointment   All instructions explained to the patient, with a verbal understanding of the material. Patient agrees to go over the instructions while at home for a better understanding. Patient also instructed to self quarantine after being tested for COVID-19. The opportunity to ask questions was provided.

## 2021-08-16 ENCOUNTER — Encounter (HOSPITAL_COMMUNITY): Payer: Self-pay

## 2021-08-16 LAB — SARS CORONAVIRUS 2 (TAT 6-24 HRS): SARS Coronavirus 2: NEGATIVE

## 2021-08-16 NOTE — Progress Notes (Signed)
Anesthesia Chart Review:  Case: [id number] Date/Time: 08/19/21 1015   Procedures:      BILATERAL TOTAL MASTECTOMY (Bilateral: Breast)     BILATERAL SENTINEL NODE BIOPSY (Bilateral: Breast)     INSERTION PORT-A-CATH   Anesthesia type: General   Pre-op diagnosis: LEFT BREAST CANCER, RIGHT BREAST DCIS   Location: [facility] OR ROOM 02 / [place] [facility]   Surgeons: [name], MD       DISCUSSION: Patient is a 65 year old female scheduled for the above procedure.  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 BMI is consistent with obesity.   I called and spoke with patient. She denied known coronary artery disease. She said at the time of her CVA, neurology told her she had "CHF". (Echo in 2014 and 06/2021 showed normal LVEF, normal LV/RV systolic function, grade 1 DD.). She has never seen a cardiologist, and denied prior stress test or cath. She denied chest pain, edema, syncope. She does have chronic DOE and occasional dyspnea at rest if the weather is particularly humid. Her COPD is managed by her PCP Dr. Redmond Pulling. She was smoking ~ 6 cig/day, but recently started Chantix and has not smoked in the past week. She does not use home O2. She has not had any recent hospitalizations for COPD exacerbations. Her activity is limited due to legs giving out and DOE. She is able to vacuum, but may have to rest before completing her house. She is compliant with Spiriva. She does notice some wheezing and uses her albuterol MDI about twice/day. Overall, does not notice any significant change  in her breathing in the past year. She knows to use albuterol the evening before and morning of surgery. She knows to contact surgeon and PCP if she develops any changes in her respiratory status between now and surgery.   She had recent annual evaluation by Dr. Redmond Pulling on 08/08/21, and he is aware of plans for double mastectomy on 08/19/21. Reported instructions to hold Plavix after 08/13/21 dose.  08/15/21 preoperative COVID-19 test was negative. Anesthesia team to evaluate on the day of surgery.    VS: BP 136/73   Pulse 90   Temp (!) 36.4 C (Oral)   Resp 18   Ht 5\' 3"  (1.6 m)   Wt 96.4 kg   SpO2 92%   BMI 37.66 kg/m    PROVIDERS: [name], MD is PCP [name], MD is HEM-ONC [name], MD is RAD-ONC Antony Contras, MD is neurologist. Last visit with imaging (stable) in Fall 2019.   LABS: Preoperative labs noted. H/H 17.3/55.6, previously elevated at 16.4/52.2 on 07/10/21. PLT count 182. AST 9 and ALT 7 on 07/10/21.  (all labs ordered are listed, but only abnormal results are displayed)  Labs Reviewed  BASIC METABOLIC PANEL - Abnormal; Notable for the following components:      Result Value   CO2 34 (*)    Glucose, Bld 128 (*)    All other components within normal limits  CBC - Abnormal; Notable for the following components:   RBC 6.25 (*)    Hemoglobin 17.3 (*)    HCT 55.6 (*)    All  other components within normal limits  SARS CORONAVIRUS 2 (TAT 6-24 HRS)     IMAGES: CXR 11/27/20 [facility]): Findings: - Stent material again projects over the right lung apex.  - There are some chronically coarsened interstitial and bronchitic  features which are not significantly changed from comparison imaging  accounting for differences in technique. No new focal consolidative  opacity is seen. No pneumothorax or visible effusion. The aorta is  calcified. The remaining cardiomediastinal contours are  unremarkable. No acute osseous or soft tissue  abnormality. Impression: Chronic interstitial and bronchitic features. No new focal  consolidative opacity or effusion.    MRI Neck 08/19/18: IMPRESSION:  Abnormal MRA of the neck with and without contrast showing chronic occlusion of both internal carotid arteries with partial reconstitution of the left ICA through collaterals with diminutive flow.  Both vertebral arteries show patent stents in the proximal portions with good flow beyond.  No significant change compared with CT angiogram 02/27/2015   MRA Head 08/19/18: IMPRESSION:  Abnormal MRA of the brain showing chronic occlusion of the proximal right internal carotid artery with diminutive flow in the right middle and anterior cerebral arteries through collaterals.  Left internal carotid artery appears to have diminutive but patent antegrade flow.  The left middle and anterior cerebral arteries also have reduced flow.  Both vertebral arteries appear to be patent with the left predominant.  The basilar artery has a dolichoectatic course.  There is mild narrowing of the right vertebral artery in the V4 segment.  Overall no significant change compared with previous MRI dated 11/10/2015   EKG: 08/15/21: Normal sinus rhythm Low voltage QRS Septal infarct , age undetermined Abnormal ECG No significant change since 2017 Confirmed by Jenkins Rouge ([id number]) on 08/15/2021 3:57:41 PM   CV: Echo 07/24/21: IMPRESSIONS   1. Left ventricular ejection fraction, by estimation, is 60 to 65%. The  left ventricle has normal function. The left ventricle has no regional  wall motion abnormalities. Left ventricular diastolic parameters are  consistent with Grade I diastolic  dysfunction (impaired relaxation). The average left ventricular global  longitudinal strain is -19.7 %. The global longitudinal strain is normal.   2. Right ventricular systolic function is normal. The right ventricular  size is normal. Tricuspid regurgitation signal is inadequate for  assessing  PA pressure.   3. The mitral valve is grossly normal. No evidence of mitral valve  regurgitation. No evidence of mitral stenosis.   4. The aortic valve is grossly normal. Aortic valve regurgitation is not  visualized. No aortic stenosis is present.   5. The inferior vena cava is normal in size with greater than 50%  respiratory variability, suggesting right atrial pressure of 3 mmHg.  - Conclusion(s)/Recommendation(s): Normal biventricular function without  evidence of hemodynamically significant valvular heart disease.    Past Medical History:  Diagnosis Date   Anxiety    Arthritis    Cancer ([place])    COPD (chronic obstructive pulmonary disease) ([place])    Depression    GERD (gastroesophageal reflux disease)    Headache(784.0)    Hyperlipidemia    Hypertension    Incontinent of urine    Shortness of breath    Stroke [facility])     Past Surgical History:  Procedure Laterality Date   CAROTID STENT     Has known BICA occlusions since 2011; s/p left vertebral artery stent 12/26/09 & right VA stent 02/12/10   CHOLECYSTECTOMY     ROTATOR CUFF REPAIR     TUBAL  LIGATION      MEDICATIONS:  acetaminophen (TYLENOL) 500 MG tablet   albuterol (VENTOLIN HFA) 108 (90 Base) MCG/ACT inhaler   aspirin-acetaminophen-caffeine (EXCEDRIN MIGRAINE) 250-250-65 MG per tablet   clonazePAM (KLONOPIN) 0.5 MG tablet   clopidogrel (PLAVIX) 75 MG tablet   fluticasone (FLONASE) 50 MCG/ACT nasal spray   HYDROcodone-acetaminophen (NORCO/VICODIN) 5-325 MG tablet   nicotine (NICODERM CQ - DOSED IN MG/24 HOURS) 14 mg/24hr patch   [START ON 08/28/2021] nicotine (NICODERM CQ - DOSED IN MG/24 HR) 7 mg/24hr patch   pantoprazole (PROTONIX) 40 MG tablet   promethazine (PHENERGAN) 25 MG tablet   simvastatin (ZOCOR) 40 MG tablet   sodium chloride (OCEAN) 0.65 % SOLN nasal spray   tiotropium (SPIRIVA) 18 MCG inhalation capsule   trazodone (DESYREL) 300 MG tablet   varenicline (CHANTIX) 0.5 MG tablet    venlafaxine XR (EFFEXOR-XR) 150 MG 24 hr capsule   No current facility-administered medications for this encounter.    Myra Gianotti, PA-C Surgical Short Stay/Anesthesiology [facility] Phone [phone] [facility] Phone [phone] 08/16/2021 12:25 PM

## 2021-08-19 ENCOUNTER — Ambulatory Visit (HOSPITAL_COMMUNITY): Payer: PPO | Admitting: Anesthesiology

## 2021-08-19 ENCOUNTER — Ambulatory Visit (HOSPITAL_COMMUNITY): Payer: PPO

## 2021-08-19 ENCOUNTER — Encounter (HOSPITAL_COMMUNITY)
Admission: RE | Disposition: A | Discharge status: Home / Self Care | Payer: Self-pay | Source: Home / Self Care | Attending: General Surgery

## 2021-08-19 ENCOUNTER — Ambulatory Visit (HOSPITAL_COMMUNITY)
Admission: RE | Admit: 2021-08-19 | Discharge: 2021-08-20 | Disposition: A | Discharge status: Home / Self Care | Payer: PPO | Attending: General Surgery | Admitting: General Surgery

## 2021-08-19 ENCOUNTER — Encounter (HOSPITAL_COMMUNITY): Payer: Self-pay | Admitting: General Surgery

## 2021-08-19 ENCOUNTER — Other Ambulatory Visit: Payer: Self-pay

## 2021-08-19 ENCOUNTER — Ambulatory Visit (HOSPITAL_COMMUNITY): Payer: PPO | Admitting: Vascular Surgery

## 2021-08-19 ENCOUNTER — Ambulatory Visit (HOSPITAL_COMMUNITY)
Admission: RE | Admit: 2021-08-19 | Discharge: 2021-08-19 | Disposition: A | Discharge status: Home / Self Care | Payer: PPO | Source: Ambulatory Visit | Attending: General Surgery | Admitting: General Surgery

## 2021-08-19 DIAGNOSIS — N6489 Other specified disorders of breast: Secondary | ICD-10-CM | POA: Diagnosis not present

## 2021-08-19 DIAGNOSIS — Z95828 Presence of other vascular implants and grafts: Secondary | ICD-10-CM

## 2021-08-19 DIAGNOSIS — Z419 Encounter for procedure for purposes other than remedying health state, unspecified: Secondary | ICD-10-CM

## 2021-08-19 DIAGNOSIS — C50911 Malignant neoplasm of unspecified site of right female breast: Secondary | ICD-10-CM | POA: Diagnosis present

## 2021-08-19 DIAGNOSIS — C50912 Malignant neoplasm of unspecified site of left female breast: Secondary | ICD-10-CM | POA: Diagnosis not present

## 2021-08-19 DIAGNOSIS — F1290 Cannabis use, unspecified, uncomplicated: Secondary | ICD-10-CM | POA: Insufficient documentation

## 2021-08-19 DIAGNOSIS — F17210 Nicotine dependence, cigarettes, uncomplicated: Secondary | ICD-10-CM | POA: Diagnosis not present

## 2021-08-19 DIAGNOSIS — C50412 Malignant neoplasm of upper-outer quadrant of left female breast: Secondary | ICD-10-CM

## 2021-08-19 DIAGNOSIS — J449 Chronic obstructive pulmonary disease, unspecified: Secondary | ICD-10-CM | POA: Diagnosis not present

## 2021-08-19 DIAGNOSIS — D0512 Intraductal carcinoma in situ of left breast: Secondary | ICD-10-CM | POA: Insufficient documentation

## 2021-08-19 DIAGNOSIS — Z7902 Long term (current) use of antithrombotics/antiplatelets: Secondary | ICD-10-CM | POA: Insufficient documentation

## 2021-08-19 DIAGNOSIS — D0511 Intraductal carcinoma in situ of right breast: Secondary | ICD-10-CM

## 2021-08-19 DIAGNOSIS — Z170 Estrogen receptor positive status [ER+]: Secondary | ICD-10-CM

## 2021-08-19 DIAGNOSIS — Z803 Family history of malignant neoplasm of breast: Secondary | ICD-10-CM | POA: Diagnosis not present

## 2021-08-19 DIAGNOSIS — G8918 Other acute postprocedural pain: Secondary | ICD-10-CM | POA: Diagnosis not present

## 2021-08-19 DIAGNOSIS — Z452 Encounter for adjustment and management of vascular access device: Secondary | ICD-10-CM | POA: Diagnosis not present

## 2021-08-19 DIAGNOSIS — N641 Fat necrosis of breast: Secondary | ICD-10-CM | POA: Diagnosis not present

## 2021-08-19 DIAGNOSIS — I5032 Chronic diastolic (congestive) heart failure: Secondary | ICD-10-CM | POA: Diagnosis not present

## 2021-08-19 DIAGNOSIS — Z79899 Other long term (current) drug therapy: Secondary | ICD-10-CM | POA: Insufficient documentation

## 2021-08-19 DIAGNOSIS — I110 Hypertensive heart disease with heart failure: Secondary | ICD-10-CM | POA: Insufficient documentation

## 2021-08-19 DIAGNOSIS — K219 Gastro-esophageal reflux disease without esophagitis: Secondary | ICD-10-CM | POA: Insufficient documentation

## 2021-08-19 DIAGNOSIS — E785 Hyperlipidemia, unspecified: Secondary | ICD-10-CM | POA: Diagnosis not present

## 2021-08-19 DIAGNOSIS — N6021 Fibroadenosis of right breast: Secondary | ICD-10-CM | POA: Diagnosis not present

## 2021-08-19 HISTORY — PX: SENTINEL NODE BIOPSY: SHX6608

## 2021-08-19 HISTORY — PX: TOTAL MASTECTOMY: SHX6129

## 2021-08-19 HISTORY — PX: PORTACATH PLACEMENT: SHX2246

## 2021-08-19 LAB — CREATININE, SERUM
Creatinine, Ser: 1.07 mg/dL — ABNORMAL HIGH (ref 0.44–1.00)
GFR, Estimated: 59 mL/min — ABNORMAL LOW (ref >60)

## 2021-08-19 SURGERY — MASTECTOMY, SIMPLE
Anesthesia: General | Site: Chest | Laterality: Right

## 2021-08-19 MED ORDER — BUPIVACAINE-EPINEPHRINE (PF) 0.25% -1:200000 IJ SOLN
INTRAMUSCULAR | Status: AC
  Filled 2021-08-19: qty 30

## 2021-08-19 MED ORDER — EPHEDRINE SULFATE-NACL 50-0.9 MG/10ML-% IV SOSY
PREFILLED_SYRINGE | INTRAVENOUS | Status: DC | PRN
  Administered 2021-08-19 (×5): 5 mg via INTRAVENOUS

## 2021-08-19 MED ORDER — ALBUTEROL SULFATE HFA 108 (90 BASE) MCG/ACT IN AERS: 2.0000 | INHALATION_SPRAY | Freq: Four times a day (QID) | RESPIRATORY_TRACT | Status: DC | PRN

## 2021-08-19 MED ORDER — FENTANYL CITRATE (PF) 100 MCG/2ML IJ SOLN: 100.0000 ug | Freq: Once | INTRAMUSCULAR | Status: AC

## 2021-08-19 MED ORDER — PHENYLEPHRINE HCL (PRESSORS) 10 MG/ML IV SOLN
INTRAVENOUS | Status: DC | PRN
  Administered 2021-08-19: 100 ug via INTRAVENOUS
  Administered 2021-08-19: 80 ug via INTRAVENOUS
  Administered 2021-08-19: 120 ug via INTRAVENOUS

## 2021-08-19 MED ORDER — HEPARIN SOD (PORK) LOCK FLUSH 100 UNIT/ML IV SOLN
INTRAVENOUS | Status: DC | PRN
  Administered 2021-08-19: 450 [IU] via INTRAVENOUS

## 2021-08-19 MED ORDER — METHOCARBAMOL 500 MG PO TABS
500.0000 mg | ORAL_TABLET | Freq: Four times a day (QID) | ORAL | Status: DC | PRN
  Administered 2021-08-20: 500 mg via ORAL
  Filled 2021-08-19: qty 1

## 2021-08-19 MED ORDER — PROPOFOL 10 MG/ML IV BOLUS
INTRAVENOUS | Status: DC | PRN
  Administered 2021-08-19: 120 mg via INTRAVENOUS
  Administered 2021-08-19: 30 mg via INTRAVENOUS

## 2021-08-19 MED ORDER — BUPIVACAINE HCL (PF) 0.25 % IJ SOLN: INTRAMUSCULAR | Status: DC | PRN

## 2021-08-19 MED ORDER — GABAPENTIN 300 MG PO CAPS
ORAL_CAPSULE | ORAL | Status: AC
  Administered 2021-08-19: 300 mg via ORAL
  Filled 2021-08-19: qty 1

## 2021-08-19 MED ORDER — HEPARIN 6000 UNIT IRRIGATION SOLUTION
Status: AC
  Filled 2021-08-19: qty 500

## 2021-08-19 MED ORDER — DEXAMETHASONE SODIUM PHOSPHATE 10 MG/ML IJ SOLN
INTRAMUSCULAR | Status: DC | PRN
  Administered 2021-08-19: 10 mg via INTRAVENOUS

## 2021-08-19 MED ORDER — CHLORHEXIDINE GLUCONATE CLOTH 2 % EX PADS: 6.0000 | MEDICATED_PAD | Freq: Once | CUTANEOUS | Status: DC

## 2021-08-19 MED ORDER — PHENYLEPHRINE HCL-NACL 20-0.9 MG/250ML-% IV SOLN
INTRAVENOUS | Status: DC | PRN
Start: 2021-08-19 — End: 2021-08-19
  Administered 2021-08-19: 50 ug/min via INTRAVENOUS

## 2021-08-19 MED ORDER — HEPARIN SOD (PORK) LOCK FLUSH 100 UNIT/ML IV SOLN
INTRAVENOUS | Status: AC
  Filled 2021-08-19: qty 5

## 2021-08-19 MED ORDER — TIOTROPIUM BROMIDE MONOHYDRATE 18 MCG IN CAPS: 18.0000 ug | ORAL_CAPSULE | Freq: Every day | RESPIRATORY_TRACT | Status: DC

## 2021-08-19 MED ORDER — LACTATED RINGERS IV SOLN: INTRAVENOUS | Status: DC | PRN

## 2021-08-19 MED ORDER — MIDAZOLAM HCL 2 MG/2ML IJ SOLN: 2.0000 mg | Freq: Once | INTRAMUSCULAR | Status: AC

## 2021-08-19 MED ORDER — MIDAZOLAM HCL 2 MG/2ML IJ SOLN
INTRAMUSCULAR | Status: AC
  Filled 2021-08-19: qty 2

## 2021-08-19 MED ORDER — ROCURONIUM BROMIDE 10 MG/ML (PF) SYRINGE
PREFILLED_SYRINGE | INTRAVENOUS | Status: AC
  Filled 2021-08-19: qty 10

## 2021-08-19 MED ORDER — MIDAZOLAM HCL 5 MG/5ML IJ SOLN
INTRAMUSCULAR | Status: DC | PRN
  Administered 2021-08-19: 2 mg via INTRAVENOUS

## 2021-08-19 MED ORDER — DEXAMETHASONE SODIUM PHOSPHATE 10 MG/ML IJ SOLN
INTRAMUSCULAR | Status: AC
  Filled 2021-08-19: qty 1

## 2021-08-19 MED ORDER — GABAPENTIN 300 MG PO CAPS: 300.0000 mg | ORAL_CAPSULE | ORAL | Status: AC

## 2021-08-19 MED ORDER — CELECOXIB 200 MG PO CAPS: 200.0000 mg | ORAL_CAPSULE | ORAL | Status: AC

## 2021-08-19 MED ORDER — ACETAMINOPHEN 500 MG PO TABS
ORAL_TABLET | ORAL | Status: AC
  Administered 2021-08-19: 1000 mg via ORAL
  Filled 2021-08-19: qty 2

## 2021-08-19 MED ORDER — PROMETHAZINE HCL 25 MG PO TABS: 25.0000 mg | ORAL_TABLET | Freq: Three times a day (TID) | ORAL | Status: DC | PRN

## 2021-08-19 MED ORDER — CHLORHEXIDINE GLUCONATE 0.12 % MT SOLN
15.0000 mL | Freq: Once | OROMUCOSAL | Status: AC
  Administered 2021-08-19: 15 mL via OROMUCOSAL
  Filled 2021-08-19: qty 15

## 2021-08-19 MED ORDER — ACETAMINOPHEN 500 MG PO TABS: 1000.0000 mg | ORAL_TABLET | ORAL | Status: AC

## 2021-08-19 MED ORDER — FENTANYL CITRATE (PF) 100 MCG/2ML IJ SOLN
INTRAMUSCULAR | Status: AC
  Administered 2021-08-19: 100 ug via INTRAVENOUS
  Filled 2021-08-19: qty 2

## 2021-08-19 MED ORDER — SUGAMMADEX SODIUM 200 MG/2ML IV SOLN
INTRAVENOUS | Status: DC | PRN
  Administered 2021-08-19: 200 mg via INTRAVENOUS

## 2021-08-19 MED ORDER — ONDANSETRON HCL 4 MG/2ML IJ SOLN
INTRAMUSCULAR | Status: DC | PRN
  Administered 2021-08-19: 4 mg via INTRAVENOUS

## 2021-08-19 MED ORDER — CELECOXIB 200 MG PO CAPS
ORAL_CAPSULE | ORAL | Status: AC
  Administered 2021-08-19: 200 mg via ORAL
  Filled 2021-08-19: qty 1

## 2021-08-19 MED ORDER — LACTATED RINGERS IV SOLN: INTRAVENOUS | Status: DC

## 2021-08-19 MED ORDER — HYDROCODONE-ACETAMINOPHEN 5-325 MG PO TABS: 1.0000 | ORAL_TABLET | Freq: Four times a day (QID) | ORAL | Status: DC | PRN

## 2021-08-19 MED ORDER — ONDANSETRON HCL 4 MG/2ML IJ SOLN: 4.0000 mg | Freq: Four times a day (QID) | INTRAMUSCULAR | Status: DC | PRN

## 2021-08-19 MED ORDER — ONDANSETRON 4 MG PO TBDP: 4.0000 mg | ORAL_TABLET | Freq: Four times a day (QID) | ORAL | Status: DC | PRN

## 2021-08-19 MED ORDER — IPRATROPIUM-ALBUTEROL 0.5-2.5 (3) MG/3ML IN SOLN: 3.0000 mL | RESPIRATORY_TRACT | Status: DC

## 2021-08-19 MED ORDER — BUPIVACAINE LIPOSOME 1.3 % IJ SUSP
INTRAMUSCULAR | Status: DC | PRN
  Administered 2021-08-19 (×2): 10 mL

## 2021-08-19 MED ORDER — TECHNETIUM TC 99M TILMANOCEPT KIT
2.0000 | PACK | Freq: Once | INTRAVENOUS | Status: AC | PRN
  Administered 2021-08-19: 2 via INTRADERMAL

## 2021-08-19 MED ORDER — ORAL CARE MOUTH RINSE: 15.0000 mL | Freq: Once | OROMUCOSAL | Status: AC

## 2021-08-19 MED ORDER — CLONAZEPAM 0.5 MG PO TABS: 0.5000 mg | ORAL_TABLET | Freq: Three times a day (TID) | ORAL | Status: DC | PRN

## 2021-08-19 MED ORDER — FENTANYL CITRATE (PF) 100 MCG/2ML IJ SOLN
INTRAMUSCULAR | Status: DC | PRN
  Administered 2021-08-19: 50 ug via INTRAVENOUS
  Administered 2021-08-19: 100 ug via INTRAVENOUS

## 2021-08-19 MED ORDER — PANTOPRAZOLE SODIUM 40 MG IV SOLR: 40.0000 mg | Freq: Every day | INTRAVENOUS | Status: DC

## 2021-08-19 MED ORDER — FENTANYL CITRATE (PF) 250 MCG/5ML IJ SOLN
INTRAMUSCULAR | Status: AC
  Filled 2021-08-19: qty 5

## 2021-08-19 MED ORDER — HEPARIN SODIUM (PORCINE) 5000 UNIT/ML IJ SOLN
5000.0000 [IU] | Freq: Three times a day (TID) | INTRAMUSCULAR | Status: DC
  Filled 2021-08-19 (×2): qty 1

## 2021-08-19 MED ORDER — ROCURONIUM BROMIDE 10 MG/ML (PF) SYRINGE
PREFILLED_SYRINGE | INTRAVENOUS | Status: DC | PRN
  Administered 2021-08-19: 100 mg via INTRAVENOUS
  Administered 2021-08-19 (×2): 40 mg via INTRAVENOUS

## 2021-08-19 MED ORDER — PANTOPRAZOLE SODIUM 40 MG PO TBEC
40.0000 mg | DELAYED_RELEASE_TABLET | Freq: Two times a day (BID) | ORAL | Status: DC
  Administered 2021-08-19: 40 mg via ORAL
  Filled 2021-08-19: qty 1

## 2021-08-19 MED ORDER — NALOXONE HCL 0.4 MG/ML IJ SOLN
INTRAMUSCULAR | Status: DC | PRN
  Administered 2021-08-19 (×4): 80 ug via INTRAVENOUS

## 2021-08-19 MED ORDER — MIDAZOLAM HCL 2 MG/2ML IJ SOLN
INTRAMUSCULAR | Status: AC
  Administered 2021-08-19: 2 mg via INTRAVENOUS
  Filled 2021-08-19: qty 2

## 2021-08-19 MED ORDER — UMECLIDINIUM BROMIDE 62.5 MCG/ACT IN AEPB
1.0000 | INHALATION_SPRAY | Freq: Every day | RESPIRATORY_TRACT | Status: DC
  Filled 2021-08-19 (×2): qty 7

## 2021-08-19 MED ORDER — CEFAZOLIN SODIUM-DEXTROSE 2-4 GM/100ML-% IV SOLN
2.0000 g | INTRAVENOUS | Status: AC
  Administered 2021-08-19: 2 g via INTRAVENOUS

## 2021-08-19 MED ORDER — BUPIVACAINE HCL (PF) 0.25 % IJ SOLN
INTRAMUSCULAR | Status: DC | PRN
  Administered 2021-08-19 (×2): 20 mL

## 2021-08-19 MED ORDER — 0.9 % SODIUM CHLORIDE (POUR BTL) OPTIME
TOPICAL | Status: DC | PRN
  Administered 2021-08-19 (×2): 1000 mL

## 2021-08-19 MED ORDER — ALBUTEROL SULFATE (2.5 MG/3ML) 0.083% IN NEBU
INHALATION_SOLUTION | RESPIRATORY_TRACT | Status: AC
  Administered 2021-08-19: 2.5 mg
  Filled 2021-08-19: qty 3

## 2021-08-19 MED ORDER — CEFAZOLIN SODIUM-DEXTROSE 2-4 GM/100ML-% IV SOLN
INTRAVENOUS | Status: AC
  Filled 2021-08-19: qty 100

## 2021-08-19 MED ORDER — BUPIVACAINE-EPINEPHRINE (PF) 0.25% -1:200000 IJ SOLN
INTRAMUSCULAR | Status: DC | PRN
  Administered 2021-08-19: 6 mL

## 2021-08-19 MED ORDER — ALBUTEROL SULFATE (2.5 MG/3ML) 0.083% IN NEBU: 2.5000 mg | INHALATION_SOLUTION | Freq: Four times a day (QID) | RESPIRATORY_TRACT | Status: DC | PRN

## 2021-08-19 MED ORDER — SIMVASTATIN 20 MG PO TABS: 40.0000 mg | ORAL_TABLET | Freq: Every day | ORAL | Status: DC

## 2021-08-19 MED ORDER — TRAZODONE HCL 150 MG PO TABS
300.0000 mg | ORAL_TABLET | Freq: Every day | ORAL | Status: DC
  Administered 2021-08-19: 300 mg via ORAL
  Filled 2021-08-19: qty 2

## 2021-08-19 MED ORDER — HYDROCODONE-ACETAMINOPHEN 5-325 MG PO TABS
1.0000 | ORAL_TABLET | ORAL | Status: DC | PRN
  Administered 2021-08-20: 2 via ORAL
  Filled 2021-08-19: qty 2

## 2021-08-19 MED ORDER — SODIUM CHLORIDE 0.9 % IV SOLN: INTRAVENOUS | Status: DC

## 2021-08-19 MED ORDER — ONDANSETRON HCL 4 MG/2ML IJ SOLN
INTRAMUSCULAR | Status: AC
  Filled 2021-08-19: qty 2

## 2021-08-19 MED ORDER — LIDOCAINE 2% (20 MG/ML) 5 ML SYRINGE
INTRAMUSCULAR | Status: AC
  Filled 2021-08-19: qty 5

## 2021-08-19 MED ORDER — PROPOFOL 10 MG/ML IV BOLUS
INTRAVENOUS | Status: AC
  Filled 2021-08-19: qty 20

## 2021-08-19 MED ORDER — ALBUTEROL SULFATE HFA 108 (90 BASE) MCG/ACT IN AERS
INHALATION_SPRAY | RESPIRATORY_TRACT | Status: DC | PRN
  Administered 2021-08-19: 2 via RESPIRATORY_TRACT
  Administered 2021-08-19: 8 via RESPIRATORY_TRACT

## 2021-08-19 MED ORDER — HEPARIN 6000 UNIT IRRIGATION SOLUTION
Status: DC | PRN
  Administered 2021-08-19: 1

## 2021-08-19 SURGICAL SUPPLY — 63 items
ADH SKN CLS APL DERMABOND .7 (GAUZE/BANDAGES/DRESSINGS) ×4
APL PRP STRL LF DISP 70% ISPRP (MISCELLANEOUS) ×4
APPLIER CLIP 9.375 MED OPEN (MISCELLANEOUS) ×9
APR CLP MED 9.3 20 MLT OPN (MISCELLANEOUS) ×6
BAG COUNTER SPONGE SURGICOUNT (BAG) ×3 IMPLANT
BAG DECANTER FOR FLEXI CONT (MISCELLANEOUS) ×3 IMPLANT
BAG SPNG CNTER NS LX DISP (BAG) ×2
BINDER BREAST XLRG (GAUZE/BANDAGES/DRESSINGS) ×1 IMPLANT
BIOPATCH RED 1 DISK 7.0 (GAUZE/BANDAGES/DRESSINGS) ×6 IMPLANT
CANISTER SUCT 3000ML PPV (MISCELLANEOUS) ×3 IMPLANT
CHLORAPREP W/TINT 26 (MISCELLANEOUS) ×4 IMPLANT
CLIP APPLIE 9.375 MED OPEN (MISCELLANEOUS) ×2 IMPLANT
COVER PROBE W GEL 5X96 (DRAPES) ×1 IMPLANT
COVER SURGICAL LIGHT HANDLE (MISCELLANEOUS) ×3 IMPLANT
COVER TRANSDUCER ULTRASND GEL (DISPOSABLE) ×1 IMPLANT
DECANTER SPIKE VIAL GLASS SM (MISCELLANEOUS) ×3 IMPLANT
DERMABOND ADVANCED (GAUZE/BANDAGES/DRESSINGS) ×2
DERMABOND ADVANCED .7 DNX12 (GAUZE/BANDAGES/DRESSINGS) ×2 IMPLANT
DEVICE DSSCT PLSMBLD 3.0S LGHT (MISCELLANEOUS) IMPLANT
DRAIN CHANNEL 19F RND (DRAIN) ×3 IMPLANT
DRAPE C-ARM 42X120 X-RAY (DRAPES) ×3 IMPLANT
DRAPE LAPAROSCOPIC ABDOMINAL (DRAPES) ×3 IMPLANT
DRAPE ORTHO SPLIT 87X125 STRL (DRAPES) ×2 IMPLANT
DRSG PAD ABDOMINAL 8X10 ST (GAUZE/BANDAGES/DRESSINGS) ×6 IMPLANT
ELECT CAUTERY BLADE 6.4 (BLADE) ×3 IMPLANT
ELECT COATED BLADE 2.86 ST (ELECTRODE) ×3 IMPLANT
ELECT REM PT RETURN 9FT ADLT (ELECTROSURGICAL) ×3
ELECTRODE REM PT RTRN 9FT ADLT (ELECTROSURGICAL) ×2 IMPLANT
EVACUATOR SILICONE 100CC (DRAIN) ×3 IMPLANT
FILTER IN LINE W/DETACHED HOSE (FILTER) ×3 IMPLANT
GAUZE SPONGE 4X4 12PLY STRL (GAUZE/BANDAGES/DRESSINGS) ×3 IMPLANT
GEL ULTRASOUND 20GR AQUASONIC (MISCELLANEOUS) IMPLANT
GLOVE SURG ENC MOIS LTX SZ7.5 (GLOVE) ×4 IMPLANT
GOWN STRL REUS W/ TWL LRG LVL3 (GOWN DISPOSABLE) ×4 IMPLANT
GOWN STRL REUS W/TWL LRG LVL3 (GOWN DISPOSABLE) ×6
KIT BASIN OR (CUSTOM PROCEDURE TRAY) ×3 IMPLANT
KIT PORT POWER 8FR ISP CVUE (Port) ×1 IMPLANT
KIT TURNOVER KIT B (KITS) ×3 IMPLANT
NEEDLE 22X1 1/2 (OR ONLY) (NEEDLE) IMPLANT
NS IRRIG 1000ML POUR BTL (IV SOLUTION) ×3 IMPLANT
PACK GENERAL/GYN (CUSTOM PROCEDURE TRAY) ×3 IMPLANT
PAD ABD 8X10 STRL (GAUZE/BANDAGES/DRESSINGS) ×1 IMPLANT
PAD ARMBOARD 7.5X6 YLW CONV (MISCELLANEOUS) ×3 IMPLANT
PENCIL BUTTON HOLSTER BLD 10FT (ELECTRODE) ×3 IMPLANT
PENCIL SMOKE EVACUATOR (MISCELLANEOUS) ×3 IMPLANT
PLASMABLADE 3.0S W/LIGHT (MISCELLANEOUS) ×3
POSITIONER HEAD DONUT 9IN (MISCELLANEOUS) ×3 IMPLANT
SOMAVAC UNIT (SUSTAINED VACUUM SYSTEM) ×1 IMPLANT
SUT ETHILON 3 0 FSL (SUTURE) ×4 IMPLANT
SUT MNCRL AB 4-0 PS2 18 (SUTURE) ×6 IMPLANT
SUT PROLENE 2 0 SH 30 (SUTURE) ×3 IMPLANT
SUT VIC AB 0 CT1 27 (SUTURE) ×6
SUT VIC AB 0 CT1 27XBRD ANBCTR (SUTURE) IMPLANT
SUT VIC AB 3-0 SH 18 (SUTURE) ×4 IMPLANT
SUT VIC AB 3-0 SH 27 (SUTURE) ×3
SUT VIC AB 3-0 SH 27XBRD (SUTURE) ×2 IMPLANT
SYR 10ML LL (SYRINGE) ×1 IMPLANT
SYR 5ML LUER SLIP (SYRINGE) ×3 IMPLANT
TOWEL GREEN STERILE (TOWEL DISPOSABLE) ×3 IMPLANT
TOWEL GREEN STERILE FF (TOWEL DISPOSABLE) ×3 IMPLANT
TRAY LAPAROSCOPIC MC (CUSTOM PROCEDURE TRAY) ×3 IMPLANT
TUBE CONNECTING 12X1/4 (SUCTIONS) ×1 IMPLANT
WASTE COLLECTION UNITS ×2 IMPLANT

## 2021-08-19 NOTE — Anesthesia Procedure Notes (Signed)
Anesthesia Regional Block: Pectoralis block   Pre-Anesthetic Checklist: , timeout performed,  Correct Patient, Correct Site, Correct Laterality,  Correct Procedure, Correct Position, site marked,  Risks and benefits discussed,  Surgical consent,  Pre-op evaluation,  At surgeon's request and post-op pain management  Laterality: Right  Prep: chloraprep       Needles:  Injection technique: Single-shot  Needle Type: Echogenic Stimulator Needle     Needle Length: 10cm  Needle Gauge: 21     Additional Needles:   Procedures:,,,, ultrasound used (permanent image in chart),,    Narrative:  Start time: 08/19/2021 9:17 AM End time: 08/19/2021 9:27 AM Injection made incrementally with aspirations every 5 mL.  Performed by: Personally  Anesthesiologist: Duane Boston, MD

## 2021-08-19 NOTE — Anesthesia Postprocedure Evaluation (Signed)
Anesthesia Post Note  Patient: April [name]  Procedure(s) Performed: BILATERAL TOTAL MASTECTOMY (Bilateral: Breast) BILATERAL SENTINEL NODE BIOPSY (Bilateral: Breast) INSERTION PORT-A-CATH WITH ULTRASOUND (Right: Chest)     Patient location during evaluation: PACU Anesthesia Type: General Level of consciousness: patient cooperative, oriented and sedated Pain management: pain level controlled Vital Signs Assessment: post-procedure vital signs reviewed and stable Respiratory status: spontaneous breathing, nonlabored ventilation, respiratory function stable and patient connected to nasal cannula oxygen Cardiovascular status: blood pressure returned to baseline and stable Postop Assessment: no apparent nausea or vomiting Anesthetic complications: no Comments: Pt slow to awaken. Now responsive, oriented, following commands   No notable events documented.  Last Vitals:  Vitals:   08/19/21 1630 08/19/21 1650  BP: 135/62 (!) 118/52  Pulse: 86 81  Resp: 20 (!) 21  Temp:    SpO2: 95% 94%    Last Pain:  Vitals:   08/19/21 1650  TempSrc:   PainSc: Asleep                 Amneet Cendejas,E. Manjinder Breau

## 2021-08-19 NOTE — Anesthesia Procedure Notes (Signed)
Procedure Name: Intubation Date/Time: 08/19/2021 10:36 AM Performed by: Terrence Dupont, CRNA Pre-anesthesia Checklist: Patient identified, Emergency Drugs available, Suction available and Patient being monitored Patient Re-evaluated:Patient Re-evaluated prior to induction Oxygen Delivery Method: Circle system utilized Preoxygenation: Pre-oxygenation with 100% oxygen Induction Type: IV induction Ventilation: Mask ventilation without difficulty Laryngoscope Size: Mac and 3 Grade View: Grade I Tube type: Oral Tube size: 7.0 mm Number of attempts: 1 Airway Equipment and Method: Stylet and Oral airway Placement Confirmation: ETT inserted through vocal cords under direct vision, positive ETCO2 and breath sounds checked- equal and bilateral Secured at: 21 cm Tube secured with: Tape Dental Injury: Teeth and Oropharynx as per pre-operative assessment

## 2021-08-19 NOTE — Transfer of Care (Signed)
Immediate Anesthesia Transfer of Care Note  Patient: April [name]  Procedure(s) Performed: BILATERAL TOTAL MASTECTOMY (Bilateral: Breast) BILATERAL SENTINEL NODE BIOPSY (Bilateral: Breast) INSERTION PORT-A-CATH WITH ULTRASOUND (Right: Chest)  Patient Location: PACU  Anesthesia Type:General  Level of Consciousness: drowsy  Airway & Oxygen Therapy: Patient Spontanous Breathing and Patient connected to face mask oxygen  Post-op Assessment: Report given to RN and Post -op Vital signs reviewed and stable  Post vital signs: Reviewed and stable  Last Vitals:  Vitals Value Taken Time  BP 114/65 08/19/21 1449  Temp    Pulse 87 08/19/21 1452  Resp 23 08/19/21 1452  SpO2 97 % 08/19/21 1452  Vitals shown include unvalidated device data.  Last Pain:  Vitals:   08/19/21 0858  TempSrc:   PainSc: 0-No pain         Complications: No notable events documented.

## 2021-08-19 NOTE — Progress Notes (Signed)
Patient more alert at this time arouses easily attempts to say name 02 at 2l via [place] sat.95%

## 2021-08-19 NOTE — Op Note (Signed)
08/19/2021  2:14 PM  PATIENT:  April [name]  65 y.o. female  PRE-OPERATIVE DIAGNOSIS:  LEFT BREAST CANCER, RIGHT BREAST DCIS  POST-OPERATIVE DIAGNOSIS:  LEFT BREAST CANCER, RIGHT BREAST DCIS  PROCEDURE:  Procedure(s): BILATERAL TOTAL MASTECTOMY (Bilateral) BILATERAL SENTINEL NODE BIOPSY (Bilateral) INSERTION PORT-A-CATH WITH ULTRASOUND (Right)  SURGEON:  Surgeon(s) and Role:    * [name], MD - Primary    * Carlena Hurl, PA-C - Assisting  PHYSICIAN ASSISTANT:   ASSISTANTS: as above    ANESTHESIA:   local and general  EBL:  minimal   BLOOD ADMINISTERED:none  DRAINS: (2) Blake drain(s) in the prepectoral space    LOCAL MEDICATIONS USED:  MARCAINE     SPECIMEN:  Source of Specimen:  right mastectomy with sentinel node, left mastectomy with sentinel node  DISPOSITION OF SPECIMEN:  PATHOLOGY  COUNTS:  YES  TOURNIQUET:  * No tourniquets in log *  DICTATION: .Dragon Dictation  After informed consent was obtained the patient was brought to the operating room and placed in the supine position on the operating table.  After adequate induction of general anesthesia probe was placed between the patient's shoulder blades to extend the shoulder slightly.  The right chest and neck area were then prepped with ChloraPrep, allowed to dry, and draped in usual sterile manner.  An appropriate timeout was performed.  The patient was placed in Trendelenburg position.  The area lateral to the bend of the clavicle was infiltrated with quarter percent Marcaine.  A large bore needle from the Port-A-Cath kit was used to slide beneath the bend of the clavicle heading towards the sternal notch and in doing so I was able to access the right subclavian vein.  The wire would not feed into the central venous system.  After several attempts I decided to move to the right neck.  The ultrasound machine was used to visualize the right internal jugular vein and I was able to access this with a large bore  needle from the Port-A-Cath kit under direct vision.  The wire was then fed through the needle with out difficulty using the Seldinger technique.  The wire was confirmed in the central venous system using real-time fluoroscopy.  Next a small incision was made at the wire entry site as well as on the right chest wall.  The incision on the chest wall was carried through the skin and subcutaneous tissue sharply with the electrocautery.  A subcutaneous pocket was created inferior to the incision.  The incision on the right chest wall was then connected to the incision on the right neck by blunt hemostat dissection.  A tunneling device was placed across the tunnel and used to bring the tubing through the tunnel.  The tubing was then placed on the reservoir.  The reservoir was placed in the pocket and the length of the tubing was estimated using real-time fluoroscopy.  The tubing was cut to the appropriate length.  Next a sheath and dilator were fed over the wire using the Seldinger technique without difficulty.  The dilator and wire were removed from the patient.  The tubing was fed through the sheath as far as it would go and then held in place while the sheath was gently cracked and separated.  Another real-time fluoroscopy image showed the tip of the tubing to be in the distal superior vena cava.  The tubing was then permanently anchored to the reservoir.  The reservoir was anchored in the pocket with two 2-0 Prolene stitches.  The port was then aspirated and it aspirated blood easily.  The port was then flushed initially with a dilute heparin solution and then with a more concentrated heparin solution.  The subcutaneous tissue was closed over the port with interrupted 3-0 Vicryl stitches.  The skin was closed with a running 4-0 Monocryl subcuticular stitch.  Dermabond dressings were applied.  The drapes were then removed.  The arms were placed out on arm boards.  The bilateral chest and breast and axillary areas were  then prepped with ChloraPrep, allowed to dry, and draped in usual sterile manner.  Another timeout was performed.  Attention was first turned to the right breast.  An elliptical incision was made around the nipple and areolar complex in order to minimize the excess skin with a 10 blade knife.  The incision was carried through the skin and subcutaneous tissue sharply with the PlasmaBlade.  Breast hooks were used to elevate the skin flaps anteriorly towards the ceiling.  Thin skin flaps were then created by dissecting between the breast tissue and the subcutaneous fat and skin.  This dissection was carried all the way to the chest wall.  Next the breast was removed from the pectoralis muscle with the pectoralis fashion.  The neoprobe was set to technetium and an area of radioactivity was readily identified in the deep right axilla.  This area was excised sharply with the PlasmaBlade.  This was sent as sentinel node #1.  Ex vivo counts on this node were approximately 500.  No other hot or palpable nodes were identified in the right axilla.  Hemostasis was achieved using the PlasmaBlade.  The wound was irrigated with copious amounts of saline.  The lateral axillary tissue was tacked to the chest wall with interrupted 0 Vicryl stitches.  A small stab incision was made near the anterior axillary line inferior to the operative bed.  A tonsil clamp was placed through this opening and used to bring a 19 [place] round Blake drain into the operative bed.  The drain was curled along the chest wall.  The drain was anchored to the skin with a 3-0 nylon stitch.  The superior and inferior skin flaps were then grossly reapproximated with interrupted 3-0 Vicryl stitches.  The skin was then closed with a running 4-0 Monocryl subcuticular stitch.  The drain was placed to suction and there was a good seal.  Attention was then turned to the left breast.  A similar elliptical incision was made with a 10 blade knife.  The incision was  carried through the skin and subcutaneous tissue sharply with the PlasmaBlade.  Breast hooks were used to elevate the skin flaps anteriorly towards the saline.  Then skin flaps were then created by dissecting between the breast tissue and the subcutaneous fat.  This dissection was carried circumferentially all the way to the chest wall.  Next the breast was removed from the pectoralis muscle with the pectoralis fascia.  Once this was accomplished the breast was removed from the patient.  The neoprobe was able to identify an area of radioactivity in the deep left axillary space.  This area was excised sharply with the PlasmaBlade.  Ex vivo counts on this node were approximately 500.  This was sent as sentinel node #1 from the left axilla.  Both breasts were marked with stitches on the lateral skin.  All of the tissue was then sent to pathology for further evaluation.  No other hot or palpable nodes were identified in the left  axilla.  Hemostasis was achieved using the PlasmaBlade.  The wound was irrigated with copious amounts of saline.  A small stab incision was made near the anterior axillary line.  A tonsil clamp was placed through this opening and used to bring a 19 [place] round Blake drain into the operative bed.  The drain was curled along the chest wall.  The drain was anchored to the skin with 3-0 nylon stitch.  Next the superior and inferior skin flaps were grossly reapproximated with interrupted 3-0 Vicryl stitches.  The skin was then closed with a running 4-0 Monocryl subcuticular stitch.  Dermabond dressings were then applied.  The patient tolerated the procedure well.  At the end of the case all needle sponge and instrument counts were correct.  The patient was then awakened and taken to recovery in stable condition.  The drains were placed to a suction device and there was a good seals.  PLAN OF CARE: Admit for overnight observation  PATIENT DISPOSITION:  PACU - hemodynamically stable.   Delay  start of Pharmacological VTE agent (>24hrs) due to surgical blood loss or risk of bleeding: no

## 2021-08-19 NOTE — H&P (Signed)
REFERRING PHYSICIAN: [facility] *  PROVIDER: Landry Corporal, MD  MRN: [id number] DOB: 1956/05/10  Subjective   Chief Complaint: Breast Cancer   History of Present Illness: April [name] is a 65 y.o. female who is seen today as an office consultation at the request of Dr. Imaging for evaluation of Breast Cancer .   We are asked to see the patient in consultation by Dr. Lindi Adie to evaluate her for bilateral breast cancer. The patient is a 65 year old white female who recently felt a mass in the outer aspect of the left breast. She has not had a mammogram in a few years. She went to her medical doctor who evaluated her with further imaging. The mass in the left breast measured 1.7 cm in the axilla looked negative. 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 She has no family history of breast cancer. She does have a personal history of stroke with some left-handed weakness and incontinence. She takes Plavix and is followed by Dr. Willaim Rayas in neurology. She still smokes about 2 packs of cigarettes a day  Review of Systems: A complete review of systems was obtained from the patient. I have reviewed this information and discussed as appropriate with the patient. See HPI as well for other ROS.  ROS   Medical History: Past Medical History:  Diagnosis Date   Anxiety   Arthritis   Asthma, unspecified asthma severity, unspecified whether complicated, unspecified whether persistent   CHF (congestive heart failure) (CMS-HCC)   COPD (chronic obstructive pulmonary disease) (CMS-HCC)   GERD (gastroesophageal reflux disease)   History of cancer   History of  stroke   Hyperlipidemia   Patient Active Problem List  Diagnosis   Malignant neoplasm of upper-outer quadrant of left breast in female, estrogen receptor positive (CMS-HCC)   TIA (transient ischemic attack)   Chronic diastolic (congestive) heart failure (CMS-HCC)   COPD (chronic obstructive pulmonary disease) (CMS-HCC)   Ductal carcinoma in situ (DCIS) of right breast   Past Surgical History:  Procedure Laterality Date   CHOLECYSTECTOMY N/A  Date Unknown   Tubal Ligation Surgery N/A  Date Unknown    No Known Allergies  Current Outpatient Medications on File Prior to Visit  Medication Sig Dispense Refill   clonazePAM (KLONOPIN) 0.5 MG tablet Take 0.5 mg by mouth 3 (three) times daily as needed   clopidogreL (PLAVIX) 75 mg tablet TAKE 1 TABLET EVERY DAY TO PREVENT STROKES   fluticasone propionate (FLONASE) 50 mcg/actuation nasal spray USE 1-2 SPRAYS IN EACH NOSTRIL DAILY   gabapentin (NEURONTIN) 600 MG tablet TAKE ONE TABLET THREE TIMES A DAY FOR BACK PAIN.   pantoprazole (PROTONIX) 40 MG DR tablet TAKE 1 TABLET TWICE A DAY FOR ACID REFLUX   promethazine (PHENERGAN) 25 MG tablet TAKE 1/2 TO ONE TABLET EVERY 4 HOURS AS NEEDED FOR NAUSEA.   simvastatin (ZOCOR) 40 MG tablet TAKE 1 TABLET EVERY EVENING TO CONTROL CHOLESTEROL   tiotropium (SPIRIVA WITH HANDIHALER) 18 mcg inhalation capsule INHALE CONTENTS OF 1 CAPSULE EVERY DAY VIA HANDIHALER   No current facility-administered medications on file prior to visit.   Family History  Problem Relation Age of Onset   Myocardial Infarction (Heart attack) Mother   Migraines Mother   High blood pressure (Hypertension) Mother   Diabetes Father   Hyperlipidemia (Elevated cholesterol) Father   High blood pressure (Hypertension) Father   Obesity Father   Obesity Sister   Breast cancer Other    Social History   Tobacco Use  Smoking Status Current Every Day Smoker   Packs/day: 1.00  Smokeless Tobacco Never Used    Social History    Socioeconomic History   Marital status: Unknown  Tobacco Use   Smoking status: Current Every Day Smoker  Packs/day: 1.00   Smokeless tobacco: Never Used  Vaping Use   Vaping Use: Never used  Substance and Sexual Activity   Alcohol use: Never   Drug use: Yes  Types: Marijuana  Comment: marijuana   Objective:   There were no vitals filed for this visit.  There is no height or weight on file to calculate BMI.  Physical Exam Vitals reviewed.  Constitutional:  General: She is not in acute distress. Appearance: Normal appearance.  HENT:  Head: Normocephalic and atraumatic.  Right Ear: External ear normal.  Left Ear: External ear normal.  Nose: Nose normal.  Mouth/Throat:  Mouth: Mucous membranes are moist.  Pharynx: Oropharynx is clear.  Eyes:  General: No scleral icterus. Extraocular Movements: Extraocular movements intact.  Conjunctiva/sclera: Conjunctivae normal.  Pupils: Pupils are equal, round, and reactive to light.  Cardiovascular:  Rate and Rhythm: Normal rate and regular rhythm.  Pulses: Normal pulses.  Heart sounds: Normal heart sounds.  Pulmonary:  Effort: Pulmonary effort is normal. No respiratory distress.  Breath sounds: Wheezing present.  Abdominal:  General: Bowel sounds are normal.  Palpations: Abdomen is soft.  Tenderness: There is no abdominal tenderness.  Musculoskeletal:  General: No swelling, tenderness or deformity. Normal range of motion.  Cervical back: Normal range of motion and neck supple.  Skin: General: Skin is warm and dry.  Coloration: Skin is not jaundiced.  Neurological:  General: No focal deficit present.  Mental Status: She is alert and oriented to person, place, and time.  Psychiatric:  Mood and Affect: Mood normal.  Behavior: Behavior normal.   Breast: There is a large hematoma involving the outer and central portion of the right breast. There is a subtle palpable mass in the upper outer quadrant of the left breast.  There is no palpable axillary, supraclavicular, or cervical lymphadenopathy.  Labs, Imaging and Diagnostic Testing:  Assessment and Plan:  Diagnoses and all orders for this visit:  Malignant neoplasm of upper-outer quadrant of left breast in female, estrogen receptor positive (CMS-HCC)  Ductal carcinoma in situ (DCIS) of right breast    The patient appears to have a 1.7 cm cancer in the upper outer quadrant of the left breast with clinically negative nodes and a small area of DCIS in the upper outer quadrant of the right breast with 2 nearby areas of complex sclerosing lesion. I have discussed with her in detail the different options for treatment and at this point she favors bilateral mastectomies with sentinel node biopsy on both sides. She is interested in reconstruction. I will refer her to plastic surgery to talk about the options. I did counsel her that she will need to quit smoking. She will also meet with medical and radiation oncology to discuss adjuvant therapy. She will also meet with physical therapy for preoperative lymphedema testing. If she changes her mind and decides on 1 lumpectomy on the left  and 3 on the right then she may need some time for the hematoma to resolve prior to placing seeds. Since she is HER2 positive she will likely need chemotherapy as well and if we can we will place a port at the time of her surgery. We will obtain clearance from her neurologist to have her off Plavix for the surgery.

## 2021-08-19 NOTE — Interval H&P Note (Signed)
History and Physical Interval Note:  08/19/2021 9:46 AM  Art Buff  has presented today for surgery, with the diagnosis of LEFT BREAST CANCER, RIGHT BREAST DCIS.  The various methods of treatment have been discussed with the patient and family. After consideration of risks, benefits and other options for treatment, the patient has consented to  Procedure(s): BILATERAL TOTAL MASTECTOMY (Bilateral) BILATERAL SENTINEL NODE BIOPSY (Bilateral) INSERTION PORT-A-CATH (N/A) as a surgical intervention.  The patient's history has been reviewed, patient examined, no change in status, stable for surgery.  I have reviewed the patient's chart and labs.  Questions were answered to the patient's satisfaction.     [name]

## 2021-08-19 NOTE — Anesthesia Preprocedure Evaluation (Signed)
Anesthesia Evaluation  Patient identified by MRN, date of birth, ID band Patient awake    Reviewed: Allergy & Precautions, NPO status , Patient's Chart, lab work & pertinent test results  History of Anesthesia Complications Negative for: history of anesthetic complications  Airway Mallampati: I  TM Distance: >3 FB Neck ROM: Full    Dental  (+) Edentulous Upper, Missing, Poor Dentition, Dental Advisory Given   Pulmonary Current Smoker and Patient abstained from smoking.,    Pulmonary exam normal        Cardiovascular hypertension, Normal cardiovascular exam     Neuro/Psych Anxiety Depression CVA    GI/Hepatic Neg liver ROS, GERD  ,  Endo/Other  negative endocrine ROS  Renal/GU negative Renal ROS     Musculoskeletal negative musculoskeletal ROS (+)   Abdominal   Peds  Hematology negative hematology ROS (+)   Anesthesia Other Findings   Reproductive/Obstetrics                             Anesthesia Physical Anesthesia Plan  ASA: 3  Anesthesia Plan: General   Post-op Pain Management: GA combined w/ Regional for post-op pain   Induction: Intravenous  PONV Risk Score and Plan: 3 and Ondansetron, Dexamethasone and Midazolam  Airway Management Planned: Oral ETT  Additional Equipment:   Intra-op Plan:   Post-operative Plan:   Informed Consent: I have reviewed the patients History and Physical, chart, labs and discussed the procedure including the risks, benefits and alternatives for the proposed anesthesia with the patient or authorized representative who has indicated his/her understanding and acceptance.     Dental advisory given  Plan Discussed with: CRNA and Anesthesiologist  Anesthesia Plan Comments:         Anesthesia Quick Evaluation

## 2021-08-19 NOTE — Anesthesia Procedure Notes (Signed)
Anesthesia Regional Block: Pectoralis block   Pre-Anesthetic Checklist: , timeout performed,  Correct Patient, Correct Site, Correct Laterality,  Correct Procedure, Correct Position, site marked,  Risks and benefits discussed,  Surgical consent,  Pre-op evaluation,  At surgeon's request and post-op pain management  Laterality: Left  Prep: chloraprep       Needles:  Injection technique: Single-shot  Needle Type: Echogenic Stimulator Needle     Needle Length: 10cm  Needle Gauge: 21     Additional Needles:   Procedures:,,,, ultrasound used (permanent image in chart),,    Narrative:  Start time: 08/19/2021 9:07 AM End time: 08/19/2021 9:17 AM Injection made incrementally with aspirations every 5 mL.  Performed by: Personally  Anesthesiologist: Duane Boston, MD

## 2021-08-20 ENCOUNTER — Encounter (HOSPITAL_COMMUNITY): Payer: Self-pay | Admitting: General Surgery

## 2021-08-20 DIAGNOSIS — D0512 Intraductal carcinoma in situ of left breast: Secondary | ICD-10-CM | POA: Diagnosis not present

## 2021-08-20 MED ORDER — METHOCARBAMOL 500 MG PO TABS: 500.0000 mg | ORAL_TABLET | Freq: Four times a day (QID) | ORAL | 2 refills | Status: DC | PRN

## 2021-08-20 MED ORDER — HYDROCODONE-ACETAMINOPHEN 5-325 MG PO TABS: 1.0000 | ORAL_TABLET | Freq: Four times a day (QID) | ORAL | 0 refills | Status: DC | PRN

## 2021-08-20 NOTE — Discharge Summary (Signed)
Physician Discharge Summary  Patient ID: April [name] MRN: [id number] DOB/AGE: [birth year] 65 y.o.  Admit date: 08/19/2021 Discharge date: 08/20/2021  Admission Diagnoses:  Discharge Diagnoses:  Active Problems:   Bilateral breast cancer [facility])   Discharged Condition: good  Hospital Course: the pt underwent bilateral mastectomies. She tolerated surgery well. She was ready for d/c on pod 1  Consults: None  Significant Diagnostic Studies: none  Treatments: surgery: as above  Discharge Exam: Blood pressure (!) 119/59, pulse 73, temperature 98.4 F (36.9 C), temperature source Oral, resp. rate 17, height 5\' 3"  (1.6 m), weight 96.2 kg, SpO2 91 %. Chest wall: no tenderness, skin flaps look good  Disposition: Discharge disposition: 01-Home or Self Care       Discharge Instructions     Call MD for:  difficulty breathing, headache or visual disturbances   Complete by: As directed    Call MD for:  extreme fatigue   Complete by: As directed    Call MD for:  hives   Complete by: As directed    Call MD for:  persistant dizziness or light-headedness   Complete by: As directed    Call MD for:  persistant nausea and vomiting   Complete by: As directed    Call MD for:  redness, tenderness, or signs of infection (pain, swelling, redness, odor or green/yellow discharge around incision site)   Complete by: As directed    Call MD for:  severe uncontrolled pain   Complete by: As directed    Call MD for:  temperature >100.4   Complete by: As directed    Diet - low sodium heart healthy   Complete by: As directed    Discharge instructions   Complete by: As directed    Sponge bathe while drains are in. No overhead activity. Empty drains, record output daily   Increase activity slowly   Complete by: As directed    No wound care   Complete by: As directed       Allergies as of 08/20/2021   No Known Allergies      Medication List     TAKE these medications     acetaminophen 500 MG tablet Commonly known as: TYLENOL Take 1,000 mg by mouth every 8 (eight) hours as needed for moderate pain.   albuterol 108 (90 Base) MCG/ACT inhaler Commonly known as: VENTOLIN HFA Inhale 2 puffs into the lungs every 6 (six) hours as needed for wheezing or shortness of breath.   aspirin-acetaminophen-caffeine 250-250-65 MG tablet Commonly known as: EXCEDRIN MIGRAINE Take 2 tablets by mouth every 6 (six) hours as needed for migraine.   clonazePAM 0.5 MG tablet Commonly known as: KLONOPIN Take 0.5 mg by mouth in the morning, at noon, and at bedtime.   clopidogrel 75 MG tablet Commonly known as: PLAVIX Take 75 mg by mouth daily.   fluticasone 50 MCG/ACT nasal spray Commonly known as: FLONASE Place 2 sprays into both nostrils daily as needed for allergies or rhinitis.   HYDROcodone-acetaminophen 5-325 MG tablet Commonly known as: NORCO/VICODIN Take 1 tablet by mouth every 6 (six) hours as needed for severe pain.   methocarbamol 500 MG tablet Commonly known as: ROBAXIN Take 1 tablet (500 mg total) by mouth every 6 (six) hours as needed for muscle spasms.   nicotine 14 mg/24hr patch Commonly known as: NICODERM CQ - dosed in mg/24 hours Place 1 patch (14 mg total) onto the skin daily for 14 days.   nicotine 7 mg/24hr patch Commonly known as:  NICODERM CQ - dosed in mg/24 hr Place 1 patch (7 mg total) onto the skin daily. Start taking on: August 28, 2021   pantoprazole 40 MG tablet Commonly known as: PROTONIX Take 40 mg by mouth 2 (two) times daily.   promethazine 25 MG tablet Commonly known as: PHENERGAN Take 25 mg by mouth every 8 (eight) hours as needed for nausea or vomiting.   simvastatin 40 MG tablet Commonly known as: ZOCOR Take 40 mg by mouth daily.   sodium chloride 0.65 % Soln nasal spray Commonly known as: OCEAN Place 2 sprays into both nostrils 3 (three) times daily as needed for congestion.   tiotropium 18 MCG inhalation  capsule Commonly known as: SPIRIVA Place 18 mcg into inhaler and inhale daily.   trazodone 300 MG tablet Commonly known as: DESYREL Take 300 mg by mouth at bedtime.   varenicline 0.5 MG tablet Commonly known as: CHANTIX Take 0.5 mg by mouth daily.   venlafaxine XR 150 MG 24 hr capsule Commonly known as: EFFEXOR-XR Take 150 mg by mouth daily with breakfast.        Follow-up Information     [name], MD Follow up in 2 week(s).   Specialty: General Surgery Contact information: [address] [place] [address] [phone]                 Signed: [name] 08/20/2021, 6:21 AM

## 2021-08-20 NOTE — Progress Notes (Signed)
Art Buff to be D/C'd per MD order. Discussed with the patient and all questions fully answered. ? VSS, Skin clean, dry and intact without evidence of skin break down, no evidence of skin tears noted. ? IV catheter discontinued intact. Site without signs and symptoms of complications. Dressing and pressure applied. ? An After Visit Summary was printed and given to the patient. Patient informed where to pickup prescriptions. ? D/c education completed with patient/family including follow up instructions, medication list, d/c activities limitations if indicated, with other d/c instructions as indicated by MD including care of vac - patient able to verbalize understanding, all questions fully answered.  ? Patient instructed to return to ED, call 911, or call MD for any changes in condition.  ? Patient to be escorted via [place], and D/C home via private auto.

## 2021-08-20 NOTE — Progress Notes (Signed)
1 Day Post-Op   Subjective/Chief Complaint: No complaints   Objective: Vital signs in last 24 hours: Temp:  [97 F (36.1 C)-98.4 F (36.9 C)] 98.4 F (36.9 C) (10/25 0444) Pulse Rate:  [66-88] 73 (10/25 0444) Resp:  [11-34] 17 (10/25 0444) BP: (94-167)/(38-100) 119/59 (10/25 0444) SpO2:  [91 %-100 %] 91 % (10/25 0444) Weight:  [96.2 kg] 96.2 kg (10/24 0840)    Intake/Output from previous day: 10/24 0701 - 10/25 0700 In: 1500 [I.V.:1400; IV Piggyback:100] Out: 240 [Drains:40; Blood:200] Intake/Output this shift: No intake/output data recorded.  General appearance: alert and cooperative Resp: clear to auscultation bilaterally Chest wall: skin flaps look good Cardio: regular rate and rhythm GI: soft, non-tender; bowel sounds normal; no masses,  no organomegaly  Lab Results:  No results for input(s): WBC, HGB, HCT, PLT in the last 72 hours. BMET Recent Labs    08/19/21 1755  CREATININE 1.07*   PT/INR No results for input(s): LABPROT, INR in the last 72 hours. ABG No results for input(s): PHART, HCO3 in the last 72 hours.  Invalid input(s): PCO2, PO2  Studies/Results: NM Sentinel Node Inj-No Rpt (Breast)  Result Date: 08/19/2021 Sulfur Colloid was injected by the Nuclear Medicine Technologist for sentinel lymph node localization.   DG Chest Port 1 View  Result Date: 08/19/2021 CLINICAL DATA:  Status post Port-A-Cath placement. EXAM: PORTABLE CHEST 1 VIEW COMPARISON:  Chest radiograph dated 12/07/2020. FINDINGS: Right-sided Port-A-Cath with tip at the cavoatrial junction. There is mild central vascular and interstitial prominence, likely vascular congestion. No focal consolidation, pleural effusion, or pneumothorax. The cardiac silhouette is within normal limits. No acute osseous pathology. Left chest wall soft tissue air noted. Bilateral chest wall surgical clips and additional curvilinear structures which may represent surgical drain. IMPRESSION: Right-sided  Port-A-Cath with tip at the cavoatrial junction. No pneumothorax. Electronically Signed   By: Anner Crete M.D.   On: 08/19/2021 19:39   DG Fluoro Guide CV Line-No Report  Result Date: 08/19/2021 Fluoroscopy was utilized by the requesting physician.  No radiographic interpretation.    Anti-infectives: Anti-infectives (From admission, onward)    Start     Dose/Rate Route Frequency Ordered Stop   08/19/21 0845  ceFAZolin (ANCEF) IVPB 2g/100 mL premix        2 g 200 mL/hr over 30 Minutes Intravenous To ShortStay [facility] 08/19/21 0841 08/19/21 1048   08/19/21 0845  ceFAZolin (ANCEF) 2-4 GM/100ML-% IVPB       Note to Pharmacy: Alphonsus Sias   : cabinet override      08/19/21 0845 08/19/21 2059       Assessment/Plan: s/p Procedure(s): BILATERAL TOTAL MASTECTOMY (Bilateral) BILATERAL SENTINEL NODE BIOPSY (Bilateral) INSERTION PORT-A-CATH WITH ULTRASOUND (Right) Advance diet Discharge  LOS: 0 days    April [name] III 08/20/2021

## 2021-08-21 LAB — SURGICAL PATHOLOGY

## 2021-08-26 ENCOUNTER — Encounter: Payer: Self-pay | Admitting: *Deleted

## 2021-08-27 NOTE — Progress Notes (Addendum)
Patient Care Team: [name], MD as PCP - General (Family Medicine) [name], RN as Oncology Nurse Navigator [name], RN as Oncology Nurse Navigator [name], MD as Consulting Physician (General Surgery) [name], MD as Consulting Physician (Hematology and Oncology) [name], MD as Consulting Physician (Radiation Oncology)  DIAGNOSIS:    ICD-10-CM   1. Malignant neoplasm of upper-outer quadrant of left breast in female, estrogen receptor positive ([place])  C50.412    Z17.0       SUMMARY OF ONCOLOGIC HISTORY: Oncology History  Ductal carcinoma in situ (DCIS) of right breast  06/28/2021 Initial Diagnosis   Palpable mass in the left breast. Diagnostic mammogram and [place]: suspicious 1.7 cm mass in the left breast at the 1 o'clock position and 2 subtle areas of distortion in the upper outer right breast.  Left breast biopsy: Grade 3 IDC high grade focal DCIS Her2+/ER+(90%)/PR+(70%) in the left breast, and DCIS involving sclerosing adenosis with calcifications ER+(100%)/PR+(70%) in the right breast.    07/08/2021 Initial Diagnosis   Ductal carcinoma in situ (DCIS) of right breast   Malignant neoplasm of upper-outer quadrant of left breast in female, estrogen receptor positive ([place])  06/28/2021 Initial Diagnosis   Palpable mass in the left breast. Diagnostic mammogram and [place]: suspicious 1.7 cm mass in the left breast at the 1 o'clock position and 2 subtle areas of distortion in the upper outer right breast.  Left breast biopsy: Grade 3 IDC high grade focal DCIS Her2+/ER+(90%)/PR+(70%) in the left breast, and DCIS involving sclerosing adenosis with calcifications ER+(100%)/PR+(70%) in the right breast.    07/10/2021 Cancer Staging   Staging form: Breast, AJCC 8th Edition - Clinical stage from 07/10/2021: Stage IA (cT1c, cN0, cM0, G3, ER+, PR+, HER2+) - Signed by [name], MD on 07/10/2021 Stage prefix: Initial diagnosis Histologic grading system: 3 grade  system     Genetic Testing   [facility] CustomNext (47 gene) Panel was negative. No pathogenic variants were identified. The report date is 07/23/2021.  The CustomNext-Cancer+RNAinsight panel offered by [facility] includes sequencing and rearrangement analysis for the following 47 genes:  APC, ATM, AXIN2, BARD1, BMPR1A, BRCA1, BRCA2, BRIP1, CDH1, CDK4, CDKN2A, CHEK2, DICER1, EPCAM, GREM1, HOXB13, MEN1, MLH1, MSH2, MSH3, MSH6, MUTYH, NBN, NF1, NF2, NTHL1, PALB2, PMS2, POLD1, POLE, PTEN, RAD51C, RAD51D, RECQL, RET, SDHA, SDHAF2, SDHB, SDHC, SDHD, SMAD4, SMARCA4, STK11, TP53, TSC1, TSC2, and VHL.  RNA data is routinely analyzed for use in variant interpretation for all genes.     CHIEF COMPLIANT: Follow-up of breast cancer  INTERVAL HISTORY: April [name] is a 65 y.o. with above-mentioned history of bilateral breast cancer. Bilateral mastectomies on 08/19/2021 showed intermediate to high-grade DCIS to the right breast, and grade 3 invasive ductal carcinoma with calcifications and high-grade DCIS with necrosis in the left breast. She presents to the clinic today for follow-up.   ALLERGIES:  has No Known Allergies.  MEDICATIONS:  Current Outpatient Medications  Medication Sig Dispense Refill   acetaminophen (TYLENOL) 500 MG tablet Take 1,000 mg by mouth every 8 (eight) hours as needed for moderate pain.     albuterol (VENTOLIN HFA) 108 (90 Base) MCG/ACT inhaler Inhale 2 puffs into the lungs every 6 (six) hours as needed for wheezing or shortness of breath.     aspirin-acetaminophen-caffeine (EXCEDRIN MIGRAINE) 250-250-65 MG per tablet Take 2 tablets by mouth every 6 (six) hours as needed for migraine.      clonazePAM (KLONOPIN) 0.5 MG tablet Take 0.5 mg  by mouth in the morning, at noon, and at bedtime.  5   clopidogrel (PLAVIX) 75 MG tablet Take 75 mg by mouth daily.     fluticasone (FLONASE) 50 MCG/ACT nasal spray Place 2 sprays into both nostrils daily as needed for allergies or rhinitis.      HYDROcodone-acetaminophen (NORCO/VICODIN) 5-325 MG tablet Take 1 tablet by mouth every 6 (six) hours as needed for severe pain. 15 tablet 0   methocarbamol (ROBAXIN) 500 MG tablet Take 1 tablet (500 mg total) by mouth every 6 (six) hours as needed for muscle spasms. 30 tablet 2   nicotine (NICODERM CQ - DOSED IN MG/24 HOURS) 14 mg/24hr patch Place 1 patch (14 mg total) onto the skin daily for 14 days. (Patient not taking: No sig reported) 14 patch 0   nicotine (NICODERM CQ - DOSED IN MG/24 HR) 7 mg/24hr patch Place 1 patch (7 mg total) onto the skin daily. (Patient not taking: No sig reported) 28 patch 0   pantoprazole (PROTONIX) 40 MG tablet Take 40 mg by mouth 2 (two) times daily.     promethazine (PHENERGAN) 25 MG tablet Take 25 mg by mouth every 8 (eight) hours as needed for nausea or vomiting.     simvastatin (ZOCOR) 40 MG tablet Take 40 mg by mouth daily.     sodium chloride (OCEAN) 0.65 % SOLN nasal spray Place 2 sprays into both nostrils 3 (three) times daily as needed for congestion.      tiotropium (SPIRIVA) 18 MCG inhalation capsule Place 18 mcg into inhaler and inhale daily.     trazodone (DESYREL) 300 MG tablet Take 300 mg by mouth at bedtime.     varenicline (CHANTIX) 0.5 MG tablet Take 0.5 mg by mouth daily.     venlafaxine XR (EFFEXOR-XR) 150 MG 24 hr capsule Take 150 mg by mouth daily with breakfast.     No current facility-administered medications for this visit.    PHYSICAL EXAMINATION: ECOG PERFORMANCE STATUS: 1 - Symptomatic but completely ambulatory  Vitals:   08/28/21 1049  BP: 119/72  Pulse: 83  Resp: 18  Temp: 97.7 F (36.5 C)  SpO2: 92%   Filed Weights   08/28/21 1049  Weight: 209 lb (94.8 kg)    BREAST: No palpable masses or nodules in either right or left breasts. No palpable axillary supraclavicular or infraclavicular adenopathy no breast tenderness or nipple discharge. (exam performed in the presence of a chaperone)  LABORATORY DATA:  I have reviewed  the data as listed CMP Latest Ref Rng & Units 08/19/2021 08/15/2021 07/10/2021  Glucose 70 - 99 mg/dL - 128(H) 96  BUN 8 - 23 mg/dL - 9 8  Creatinine 0.44 - 1.00 mg/dL 1.07(H) 0.94 0.95  Sodium 135 - 145 mmol/L - 138 141  Potassium 3.5 - 5.1 mmol/L - 4.7 3.9  Chloride 98 - 111 mmol/L - 98 101  CO2 22 - 32 mmol/L - 34(H) 32  Calcium 8.9 - 10.3 mg/dL - 9.0 9.4  Total Protein 6.5 - 8.1 g/dL - - 6.9  Total Bilirubin 0.3 - 1.2 mg/dL - - 0.6  Alkaline Phos 38 - 126 U/L - - 34(L)  AST 15 - 41 U/L - - 9(L)  ALT 0 - 44 U/L - - 7    Lab Results  Component Value Date   WBC 9.0 08/15/2021   HGB 17.3 (H) 08/15/2021   HCT 55.6 (H) 08/15/2021   MCV 89.0 08/15/2021   PLT 182 08/15/2021   NEUTROABS 6.6  07/10/2021    ASSESSMENT & PLAN:  Malignant neoplasm of upper-outer quadrant of left breast in female, estrogen receptor positive ([place]) 06/28/2021:Palpable mass in the left breast. Diagnostic mammogram and [place]: suspicious 1.7 cm mass in the left breast at the 1 o'clock position and 2 subtle areas of distortion in the upper outer right breast.  Left breast biopsy: Grade 3 IDC high grade focal DCIS Her2+/ER+(90%)/PR+(70%) in the left breast, and DCIS involving sclerosing adenosis with calcifications ER+(100%)/PR+(70%) in the right breast.    Pathology and radiology counseling: Discussed with the patient, the details of pathology including the type of breast cancer,the clinical staging, the significance of ER, PR and HER-2/neu receptors and the implications for treatment. After reviewing the pathology in detail, we proceeded to discuss the different treatment options between surgery, radiation, chemotherapy, antiestrogen therapies.   Treatment plan: 1.  bilateral mastectomies with left sentinel lymph node biopsy 2. Adjuvant   Herceptin  (not recommending Taxol because of profound peripheral neuropathy) 3.  Followed by adjuvant antiestrogen therapy   Return to clinic after surgery to discuss starting  chemotherapy     All questions were answered. The patient knows to call the clinic with any problems, questions or concerns    No orders of the defined types were placed in this encounter.  The patient has a good understanding of the overall plan. she agrees with it. she will call with any problems that may develop before the next visit here.  Total time spent: 30 mins including face to face time and time spent for planning, charting and coordination of care  Rulon Eisenmenger, MD, MPH 08/28/2021  I, Thana Ates, am acting as scribe for Dr. [name].  I have reviewed the above documentation for accuracy and completeness, and I agree with the above.

## 2021-08-27 NOTE — Assessment & Plan Note (Addendum)
08/19/2021: Bilateral mastectomies Right mastectomy: Foci of intermediate to high-grade DCIS, sclerosing adenosis and complex sclerosing lesion, margins negative, 0/4 lymph nodes negative, ER 100%, PR 70% Left mastectomy: 2.5 cm grade 3 IDC with DCIS high-grade, margins negative, 0/4 lymph nodes negative ER 90%, PR 70%, HER2 positive, Ki-67 40%  Pathology and radiology counseling: Discussed with the patient, the details of pathology including the type of breast cancer,the clinical staging, the significance of ER, PR and HER-2/neu receptors and the implications for treatment. After reviewing the pathology in detail, we proceeded to discuss the different treatment options between surgery, radiation, chemotherapy, antiestrogen therapies.  Treatment plan: 1. Adjuvant chemotherapy with Taxol Herceptin followed by Herceptin maintenance 2.  Followed by adjuvant antiestrogen therapy  Return to clinic to start chemotherapy

## 2021-08-28 ENCOUNTER — Inpatient Hospital Stay: Payer: PPO | Attending: Hematology and Oncology | Admitting: Hematology and Oncology

## 2021-08-28 ENCOUNTER — Inpatient Hospital Stay: Payer: PPO

## 2021-08-28 ENCOUNTER — Other Ambulatory Visit: Payer: Self-pay

## 2021-08-28 VITALS — BP 119/72 | HR 83 | Temp 97.7°F | Resp 18 | Ht 63.0 in | Wt 209.0 lb

## 2021-08-28 DIAGNOSIS — Z9013 Acquired absence of bilateral breasts and nipples: Secondary | ICD-10-CM | POA: Diagnosis not present

## 2021-08-28 DIAGNOSIS — Z170 Estrogen receptor positive status [ER+]: Secondary | ICD-10-CM | POA: Insufficient documentation

## 2021-08-28 DIAGNOSIS — D0511 Intraductal carcinoma in situ of right breast: Secondary | ICD-10-CM | POA: Insufficient documentation

## 2021-08-28 DIAGNOSIS — C50912 Malignant neoplasm of unspecified site of left female breast: Secondary | ICD-10-CM | POA: Diagnosis not present

## 2021-08-28 DIAGNOSIS — C50412 Malignant neoplasm of upper-outer quadrant of left female breast: Secondary | ICD-10-CM | POA: Diagnosis not present

## 2021-08-28 DIAGNOSIS — C50911 Malignant neoplasm of unspecified site of right female breast: Secondary | ICD-10-CM

## 2021-08-28 MED ORDER — HYDROCODONE-ACETAMINOPHEN 5-325 MG PO TABS: 1.0000 | ORAL_TABLET | Freq: Four times a day (QID) | ORAL | 0 refills | Status: DC | PRN

## 2021-08-28 MED ORDER — LIDOCAINE-PRILOCAINE 2.5-2.5 % EX CREA: TOPICAL_CREAM | CUTANEOUS | 3 refills | Status: DC

## 2021-08-28 NOTE — Progress Notes (Signed)
START ON PATHWAY REGIMEN - Breast     Cycle 1: A cycle is 7 days:     Trastuzumab-xxxx      Paclitaxel    Cycles 2 through 12: A cycle is every 7 days:     Trastuzumab-xxxx      Paclitaxel    Cycles 13 through 25: A cycle is every 21 days:     Trastuzumab-xxxx   **Always confirm dose/schedule in your pharmacy ordering system**  Patient Characteristics: Postoperative without Neoadjuvant Therapy (Pathologic Staging), Invasive Disease, Adjuvant Therapy, HER2 Positive, ER Positive, Node Negative, pT2, pN0, Tumor Size ?  3 cm Therapeutic Status: Postoperative without Neoadjuvant Therapy (Pathologic Staging) AJCC Grade: G3 AJCC N Category: pN0 AJCC M Category: cM0 ER Status: Positive (+) AJCC 8 Stage Grouping: IA HER2 Status: Positive (+) Oncotype Dx Recurrence Score: Not Appropriate AJCC T Category: pT2 PR Status: Positive (+) Adjuvant Therapy Status: No Adjuvant Therapy Received Yet or Changing Initial Adjuvant Regimen due to Tolerance Intent of Therapy: Curative Intent, Discussed with Patient 

## 2021-08-28 NOTE — Progress Notes (Signed)
DISCONTINUE ON PATHWAY REGIMEN - Breast     Cycle 1: A cycle is 7 days:     Trastuzumab-xxxx      Paclitaxel    Cycles 2 through 12: A cycle is every 7 days:     Trastuzumab-xxxx      Paclitaxel    Cycles 13 through 25: A cycle is every 21 days:     Trastuzumab-xxxx   **Always confirm dose/schedule in your pharmacy ordering system**  REASON: Other Reason PRIOR TREATMENT: [id number]: Weekly Paclitaxel + Trastuzumab x 12 Weeks, Followed by Trastuzumab Maintenance q21 Days x 13 Cycles TREATMENT RESPONSE: Unable to Evaluate  START OFF PATHWAY REGIMEN - Breast   [id number]:Trastuzumab and hyaluronidase-oysk 600 mg/10,000 units SUBQ D1 q21 Days:   A cycle is every 21 days:     Trastuzumab and hyaluronidase-oysk   **Always confirm dose/schedule in your pharmacy ordering system**  Patient Characteristics: Postoperative without Neoadjuvant Therapy (Pathologic Staging), Invasive Disease, Adjuvant Therapy, HER2 Positive, ER Positive, Node Negative, pT2, pN0, Tumor Size ?  3 cm Therapeutic Status: Postoperative without Neoadjuvant Therapy (Pathologic Staging) AJCC Grade: G3 AJCC N Category: pN0 AJCC M Category: cM0 ER Status: Positive (+) AJCC 8 Stage Grouping: IA HER2 Status: Positive (+) Oncotype Dx Recurrence Score: Not Appropriate AJCC T Category: pT2 PR Status: Positive (+) Adjuvant Therapy Status: No Adjuvant Therapy Received Yet or Changing Initial Adjuvant Regimen due to Tolerance Intent of Therapy: Curative Intent, Discussed with Patient

## 2021-08-29 ENCOUNTER — Encounter: Payer: Self-pay | Admitting: *Deleted

## 2021-08-29 ENCOUNTER — Ambulatory Visit: Payer: PPO | Admitting: Hematology and Oncology

## 2021-08-29 ENCOUNTER — Other Ambulatory Visit: Payer: PPO

## 2021-09-02 ENCOUNTER — Encounter (HOSPITAL_COMMUNITY): Payer: Self-pay | Admitting: General Surgery

## 2021-09-03 ENCOUNTER — Other Ambulatory Visit: Payer: Self-pay | Admitting: Hematology and Oncology

## 2021-09-03 MED ORDER — HYDROCODONE-ACETAMINOPHEN 5-325 MG PO TABS
1.0000 | ORAL_TABLET | Freq: Four times a day (QID) | ORAL | 0 refills | Status: DC | PRN
Start: 2021-09-03 — End: 2021-09-23

## 2021-09-04 ENCOUNTER — Encounter: Payer: Self-pay | Admitting: *Deleted

## 2021-09-09 ENCOUNTER — Ambulatory Visit: Payer: PPO | Admitting: Physical Therapy

## 2021-09-09 ENCOUNTER — Other Ambulatory Visit: Payer: Self-pay | Admitting: Hematology and Oncology

## 2021-09-10 ENCOUNTER — Telehealth: Payer: Self-pay | Admitting: *Deleted

## 2021-09-10 ENCOUNTER — Other Ambulatory Visit: Payer: PPO

## 2021-09-10 NOTE — Telephone Encounter (Signed)
Received call from pt with complaint of sinus pressure and headache x2 days.  Per MD pt needing to be tested for Covid and Flu as well as take OTC Mucinex DM.  Pt educated and verbalized understanding, states she will contact the office with Covid results.

## 2021-09-23 ENCOUNTER — Inpatient Hospital Stay: Payer: PPO

## 2021-09-23 ENCOUNTER — Telehealth: Payer: Self-pay

## 2021-09-23 ENCOUNTER — Other Ambulatory Visit: Payer: Self-pay

## 2021-09-23 ENCOUNTER — Other Ambulatory Visit: Payer: Self-pay | Admitting: Hematology and Oncology

## 2021-09-23 MED ORDER — HYDROCODONE-ACETAMINOPHEN 5-325 MG PO TABS: 1.0000 | ORAL_TABLET | Freq: Four times a day (QID) | ORAL | 0 refills | Status: DC | PRN

## 2021-09-23 NOTE — Telephone Encounter (Signed)
Ms Ojo was here for her Immunotherapy Education Session. She stated that her chest is really sore from doing a lot of lifting and activities this past weekend.  Her pain in ches is an 8/10.  She took 1500 mg of tylenol ~1130 today. Reviewed with Dr. Lindi Adie and he sent in # 20 tabs of hydrocodone for patient.  Left message for Annamary Rummage in financial resource support to reach out to Ms Yeldell to see if she may qualify for the [employer] grant to help with personal and medical bills.

## 2021-09-25 IMAGING — MG MM BREAST BX W/ LOC DEV EA AD LESION IMAG BX SPEC STEREO GUIDE*R
6 of 17 series · 6 of 33 positions shown · non-contrast
Comparison: Previous exams.
COMPARISON: Previous exams.

Addendum:
CLINICAL DATA: 64-year-old female for tissue sampling of a 1.7 cm
UPPER-OUTER LEFT breast mass and 3 areas of distortion within the
UPPER-OUTER RIGHT breast (7 cm, 12 cm and 14 cm from the nipple).
The patient was initially scheduled for tissue sampling of 2
separate areas of UPPER-OUTER RIGHT breast distortion, but on
preprocedure imaging today for the stereotactic guided RIGHT breast
biopsies, an additional area of distortion was identified within the
UPPER-OUTER RIGHT breast (14 cm from the nipple) and also sampled.

EXAM:
ULTRASOUND GUIDED LEFT BREAST CORE NEEDLE BIOPSY
RIGHT BREAST STEREOTACTIC CORE NEEDLE BIOPSY X 3

[R (1 of 6)]
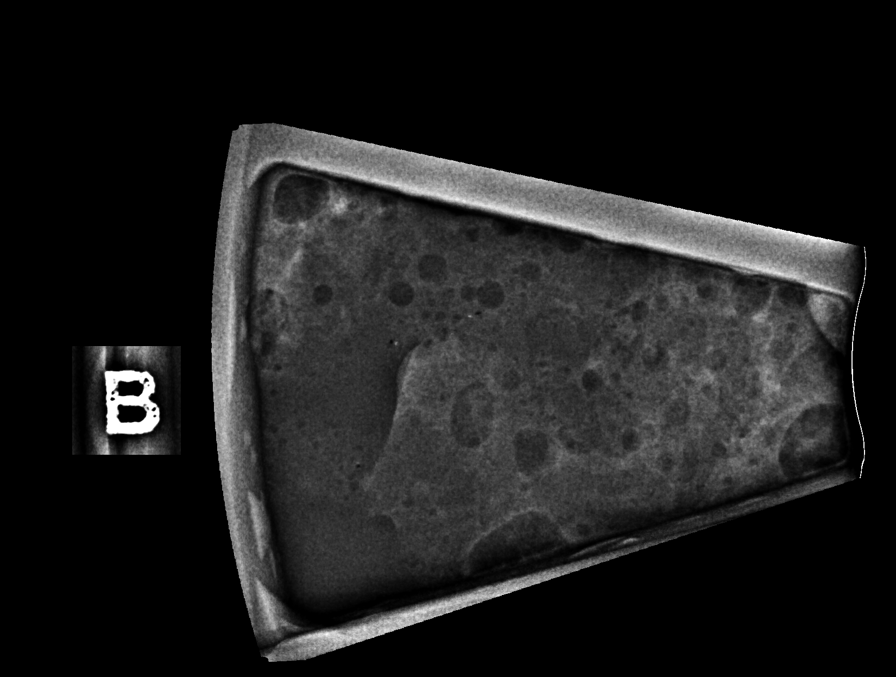

[R (2 of 6)]
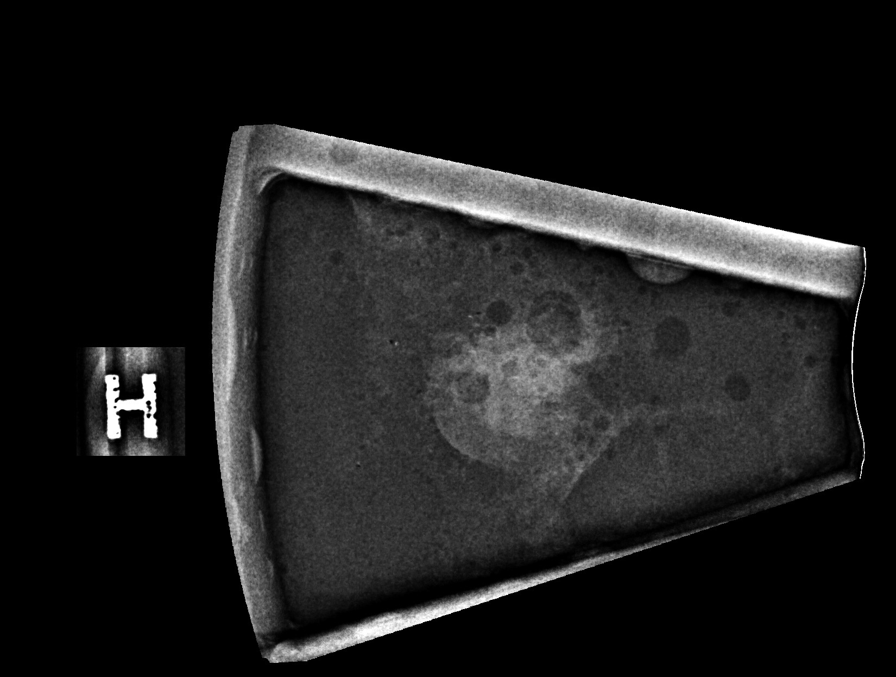

[R (3 of 6)]
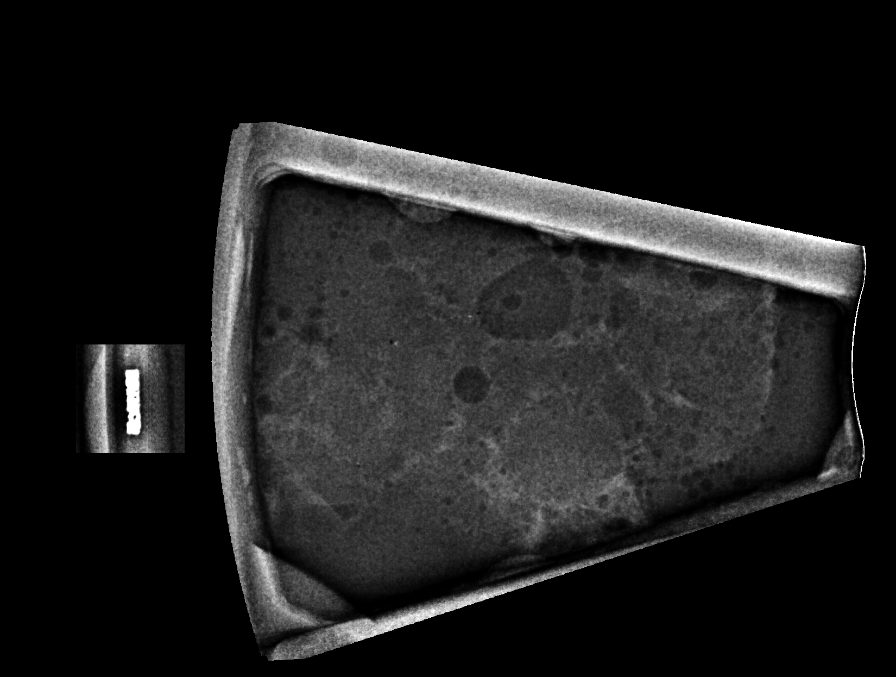

[R (4 of 6)]
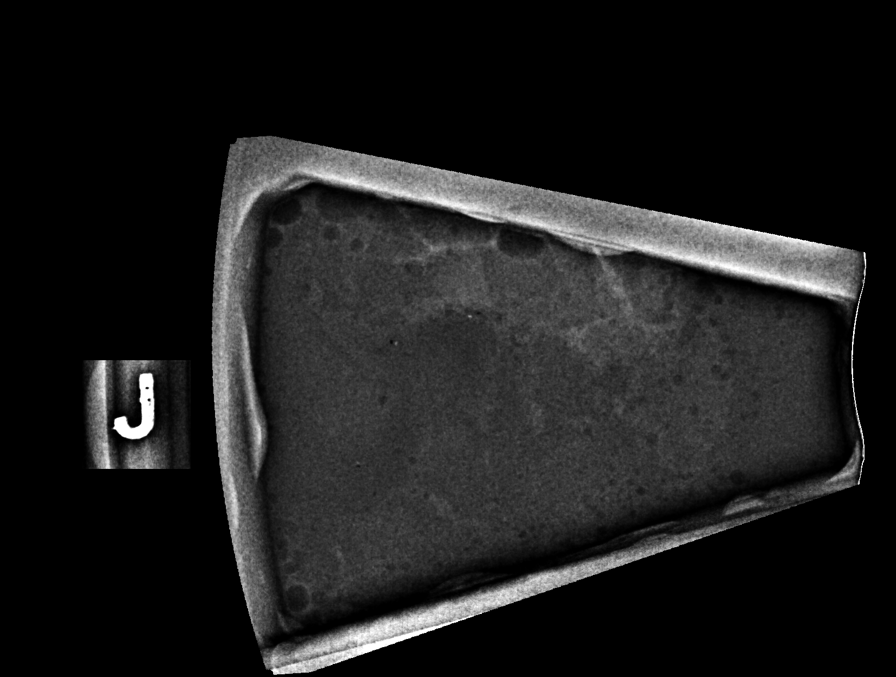

[R (5 of 6)]
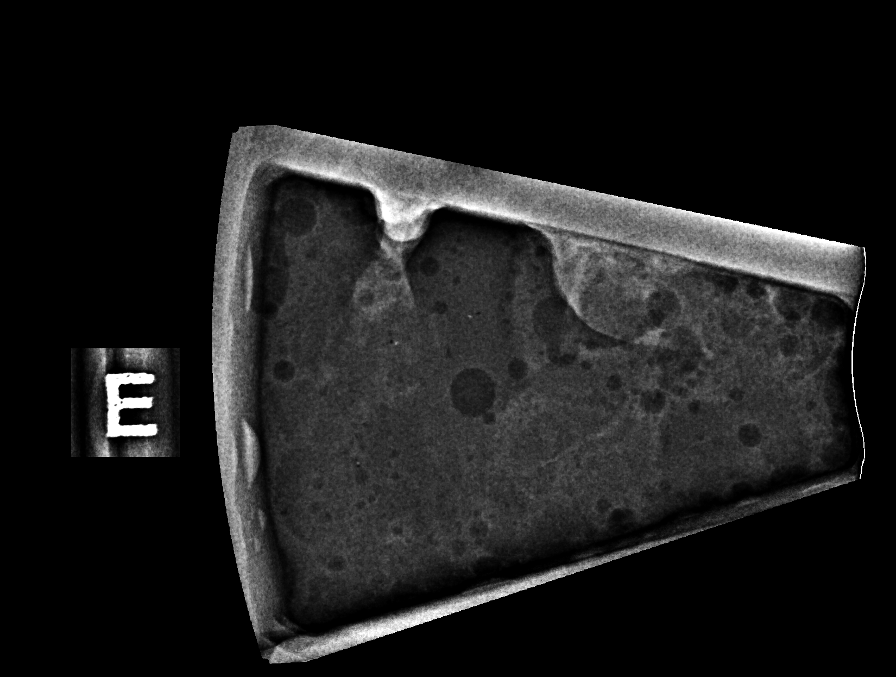

[R (6 of 6)]
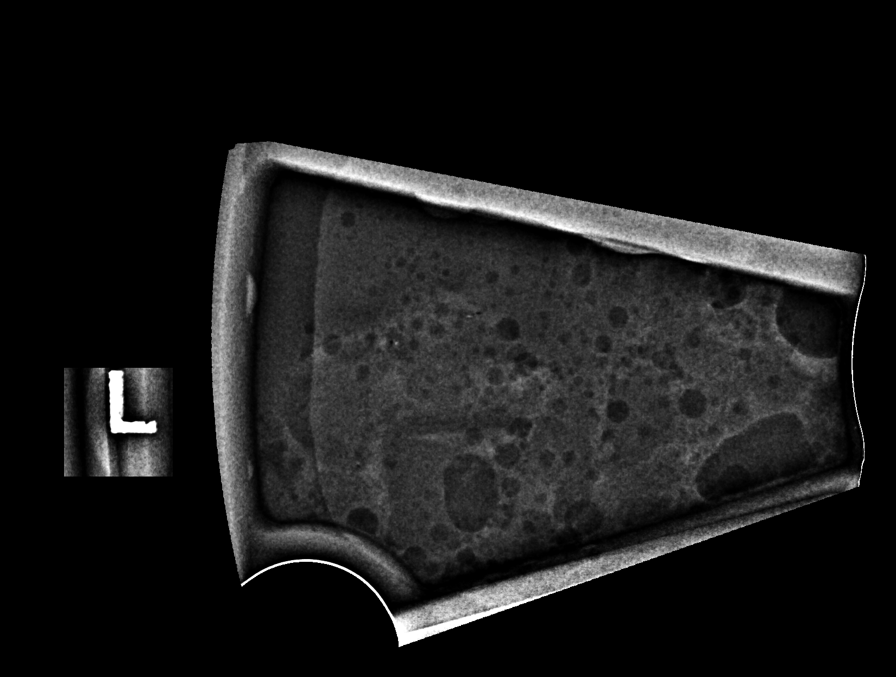

[6 of 33 positions shown; findings below may reference images not displayed]

FINDINGS: The patient and I discussed the procedures of ultrasound-guided and
stereotactic-guided biopsies including benefits and alternatives. We
discussed the high likelihood of successful procedures. We discussed
the risks of the procedures including infection, bleeding, tissue
injury, clip migration, and inadequate sampling. Informed written
consent was given. The usual time out protocol was performed
immediately prior to the procedures.

ULTRASOUND GUIDED LEFT BREAST CORE NEEDLE BIOPSY (RIBBON clip):

Lesion quadrant: UPPER-OUTER LEFT breast

Using sterile technique and 1% Lidocaine with and without
epinephrine as local anesthetic, under direct ultrasound
visualization, a 12 gauge Doro device was used to perform
biopsy of the 1.7 cm mass at the 1 o'clock position of the LEFT
breast using a MEDIAL approach. At the conclusion of the procedure a
RIBBON tissue marker clip was deployed into the biopsy cavity.
Follow up 2 view mammogram was performed and dictated separately.

RIGHT BREAST STEREOTACTIC CORE NEEDLE BIOPSY #1 (UPPER-OUTER RIGHT
breast 14 cm from the nipple-X clip):

Using sterile technique and 1% Lidocaine with and without
epinephrine as local anesthetic, under stereotactic guidance, a 9
gauge vacuum assisted device was used to perform core needle biopsy
of UPPER-OUTER RIGHT breast distortion 14 cm from the nipple using a
SUPERIOR approach. Specimen radiograph was performed.

Lesion quadrant: UPPER-OUTER RIGHT breast

At the conclusion of the procedure, an X tissue marker clip was
deployed into the biopsy cavity. Follow-up 2-view mammogram was
performed and dictated separately.

RIGHT BREAST STEREOTACTIC CORE NEEDLE BIOPSY #1 (UPPER-OUTER RIGHT
breast 12 cm from the nipple-COIL clip):

Using sterile technique and 1% Lidocaine with and without
epinephrine as local anesthetic, under stereotactic guidance, a 9
gauge vacuum assisted device was used to perform core needle biopsy
of UPPER-OUTER RIGHT breast distortion 12 cm from the nipple using a
SUPERIOR approach. Specimen radiograph was performed.

Lesion quadrant: UPPER-OUTER RIGHT breast

At the conclusion of the procedure, a COIL tissue marker clip was
deployed into the biopsy cavity. Follow-up 2-view mammogram was
performed and dictated separately.

RIGHT BREAST STEREOTACTIC CORE NEEDLE BIOPSY #2 (UPPER-OUTER RIGHT
breast distortion 7 cm from the nipple-RIBBON clip):

Using sterile technique and 1% Lidocaine with and without
epinephrine as local anesthetic, under stereotactic guidance, a 9
gauge vacuum assisted device was used to perform core needle biopsy
of UPPER-OUTER RIGHT breast distortion 7 cm from the nipple using a
approach. Specimen radiograph was performed.

Lesion quadrant: UPPER-OUTER RIGHT breast

At the conclusion of the procedure, a RIBBON tissue marker clip was
deployed into the biopsy cavity. Follow-up 2-view mammogram was
performed and dictated separately.

A total of 15 cc of lidocaine without epinephrine and 20 cc of
lidocaine with epinephrine was administered during all procedures.
IMPRESSION: 1. Ultrasound-guided biopsy of 1.7 cm UPPER-OUTER LEFT breast mass
(RIBBON clip).
2. Stereotactic guided biopsy of UPPER-OUTER RIGHT breast distortion
(14 cm from the nipple-X clip).
Stereotactic-guided biopsy of UPPER-OUTER RIGHT breast distortion
(12 cm from the nipple-COIL clip).
3. Stereotactic guided biopsy of UPPER-OUTER RIGHT breast distortion
(7 cm from the nipple-RIBBON clip).
4. No apparent complications.

ADDENDUM:
Pathology revealed GRADE III INVASIVE DUCTAL CARCINOMA WITH
CALCIFICATIONS, FOCAL DUCTAL CARCINOMA IN SITU, HIGH-GRADE of the
LEFT breast, upper outer, (ribbon clip). This was found to be
concordant by Dr. Artika Bc.

Pathology revealed COMPLEX SCLEROSING LESION WITH CALCIFICATIONS,
FIBROCYSTIC CHANGE WITH SCLEROSING ADENOSIS AND USUAL DUCTAL
HYPERPLASIA of the RIGHT breast, upper outer, 63cmfn, (x clip). This
was found to be concordant by Dr. Artika Bc, with excision
recommended.

Pathology revealed COMPLEX SCLEROSING LESION WITH APOCRINE ATYPIA
AND CALCIFICATIONS of the RIGHT breast, upper outer, 13cmfn, (coil
clip). This was found to be concordant by Dr. Artika Bc, with excision
recommended.

Pathology revealed DUCTAL CARCINOMA IN SITU INVOLVING SCLEROSING
ADENOSIS WITH CALCIFICATIONS of the RIGHT breast, upper outer,
3cmfn, (ribbon clip). This was found to be concordant by Dr. Taleski
Goran Geco.

Pathology results were discussed with the patient by telephone. The
patient reported doing well after the biopsies with tenderness and
bruising at the sites, LEFT more than RIGHT. Post biopsy
instructions and care were reviewed and questions were answered. The
patient was encouraged to call [REDACTED] for any additional concerns. My direct phone number was
provided.

The patient was referred to [REDACTED]
[REDACTED] at [REDACTED] on
July 10, 2021.

Pathology results reported by Keshia Borge, RN on 07/02/2021.

*** End of Addendum ***
FINDINGS: The patient and I discussed the procedures of ultrasound-guided and
stereotactic-guided biopsies including benefits and alternatives. We
discussed the high likelihood of successful procedures. We discussed
the risks of the procedures including infection, bleeding, tissue
injury, clip migration, and inadequate sampling. Informed written
consent was given. The usual time out protocol was performed
immediately prior to the procedures.

ULTRASOUND GUIDED LEFT BREAST CORE NEEDLE BIOPSY (RIBBON clip):

Lesion quadrant: UPPER-OUTER LEFT breast

Using sterile technique and 1% Lidocaine with and without
epinephrine as local anesthetic, under direct ultrasound
visualization, a 12 gauge Doro device was used to perform
biopsy of the 1.7 cm mass at the 1 o'clock position of the LEFT
breast using a MEDIAL approach. At the conclusion of the procedure a
RIBBON tissue marker clip was deployed into the biopsy cavity.
Follow up 2 view mammogram was performed and dictated separately.

RIGHT BREAST STEREOTACTIC CORE NEEDLE BIOPSY #1 (UPPER-OUTER RIGHT
breast 14 cm from the nipple-X clip):

Using sterile technique and 1% Lidocaine with and without
epinephrine as local anesthetic, under stereotactic guidance, a 9
gauge vacuum assisted device was used to perform core needle biopsy
of UPPER-OUTER RIGHT breast distortion 14 cm from the nipple using a
SUPERIOR approach. Specimen radiograph was performed.

Lesion quadrant: UPPER-OUTER RIGHT breast

At the conclusion of the procedure, an X tissue marker clip was
deployed into the biopsy cavity. Follow-up 2-view mammogram was
performed and dictated separately.

RIGHT BREAST STEREOTACTIC CORE NEEDLE BIOPSY #1 (UPPER-OUTER RIGHT
breast 12 cm from the nipple-COIL clip):

Using sterile technique and 1% Lidocaine with and without
epinephrine as local anesthetic, under stereotactic guidance, a 9
gauge vacuum assisted device was used to perform core needle biopsy
of UPPER-OUTER RIGHT breast distortion 12 cm from the nipple using a
SUPERIOR approach. Specimen radiograph was performed.

Lesion quadrant: UPPER-OUTER RIGHT breast

At the conclusion of the procedure, a COIL tissue marker clip was
deployed into the biopsy cavity. Follow-up 2-view mammogram was
performed and dictated separately.

RIGHT BREAST STEREOTACTIC CORE NEEDLE BIOPSY #2 (UPPER-OUTER RIGHT
breast distortion 7 cm from the nipple-RIBBON clip):

Using sterile technique and 1% Lidocaine with and without
epinephrine as local anesthetic, under stereotactic guidance, a 9
gauge vacuum assisted device was used to perform core needle biopsy
of UPPER-OUTER RIGHT breast distortion 7 cm from the nipple using a
approach. Specimen radiograph was performed.

Lesion quadrant: UPPER-OUTER RIGHT breast

At the conclusion of the procedure, a RIBBON tissue marker clip was
deployed into the biopsy cavity. Follow-up 2-view mammogram was
performed and dictated separately.

A total of 15 cc of lidocaine without epinephrine and 20 cc of
lidocaine with epinephrine was administered during all procedures.
IMPRESSION: 1. Ultrasound-guided biopsy of 1.7 cm UPPER-OUTER LEFT breast mass
(RIBBON clip).
2. Stereotactic guided biopsy of UPPER-OUTER RIGHT breast distortion
(14 cm from the nipple-X clip).
Stereotactic-guided biopsy of UPPER-OUTER RIGHT breast distortion
(12 cm from the nipple-COIL clip).
3. Stereotactic guided biopsy of UPPER-OUTER RIGHT breast distortion
(7 cm from the nipple-RIBBON clip).
4. No apparent complications.

## 2021-09-25 IMAGING — MG MM BREAST BX W/ LOC DEV 1ST LESION IMAGE BX SPEC STEREO GUIDE*R*
6 of 12 series · 6 of 12 positions shown · non-contrast
Comparison: Previous exams.
COMPARISON: Previous exams.

Addendum:
CLINICAL DATA: 64-year-old female for tissue sampling of a 1.7 cm
UPPER-OUTER LEFT breast mass and 3 areas of distortion within the
UPPER-OUTER RIGHT breast (7 cm, 12 cm and 14 cm from the nipple).
The patient was initially scheduled for tissue sampling of 2
separate areas of UPPER-OUTER RIGHT breast distortion, but on
preprocedure imaging today for the stereotactic guided RIGHT breast
biopsies, an additional area of distortion was identified within the
UPPER-OUTER RIGHT breast (14 cm from the nipple) and also sampled.

EXAM:
ULTRASOUND GUIDED LEFT BREAST CORE NEEDLE BIOPSY
RIGHT BREAST STEREOTACTIC CORE NEEDLE BIOPSY X 3

[R (1 of 6)]
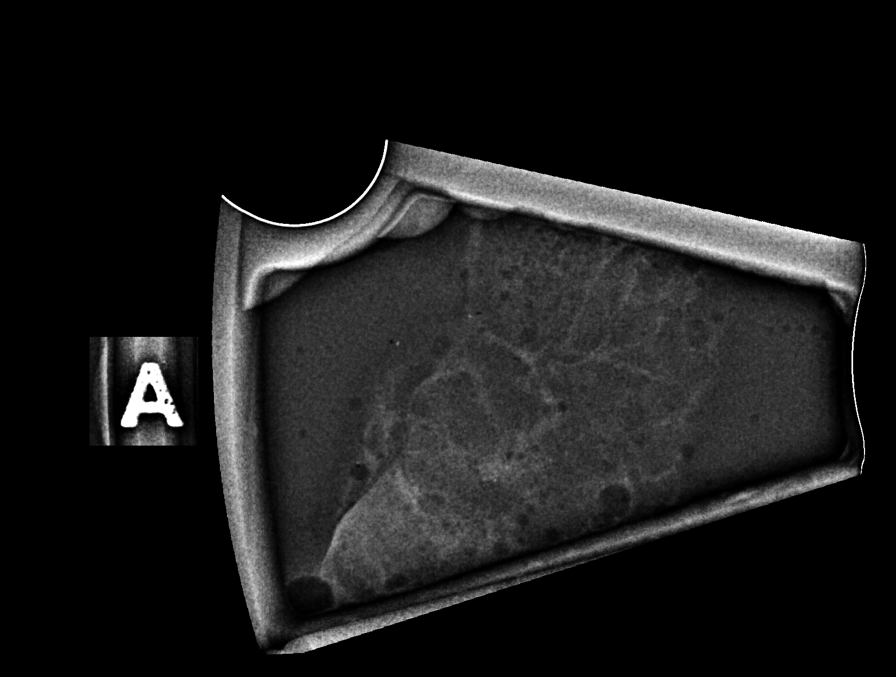

[R (2 of 6)]
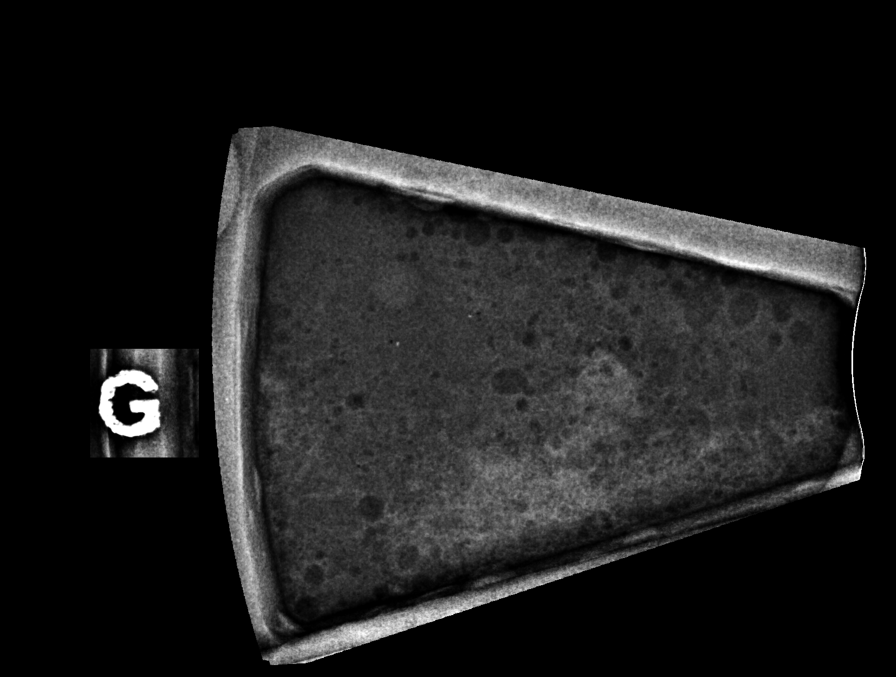

[R (3 of 6)]
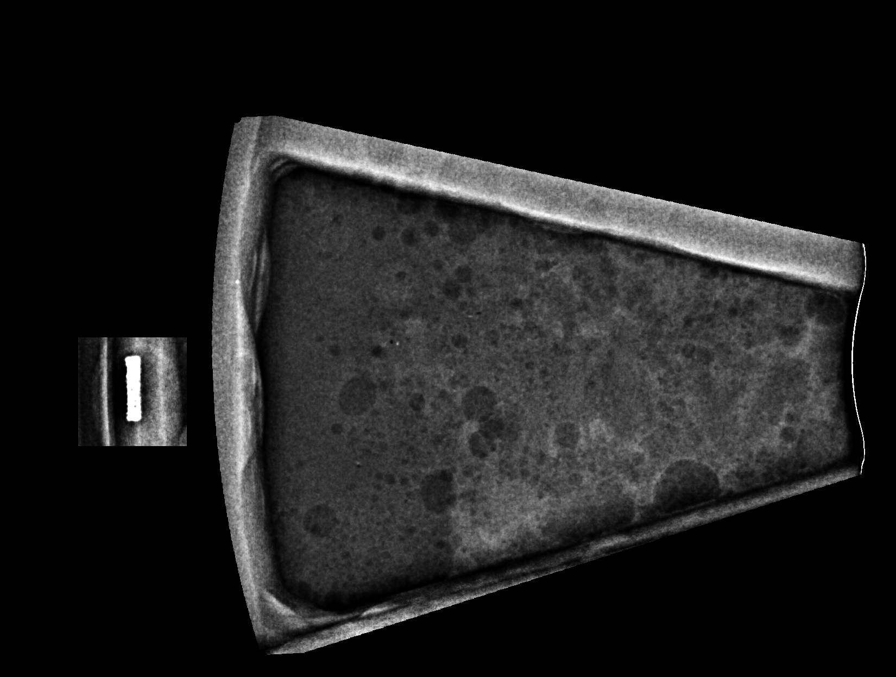

[R (4 of 6)]
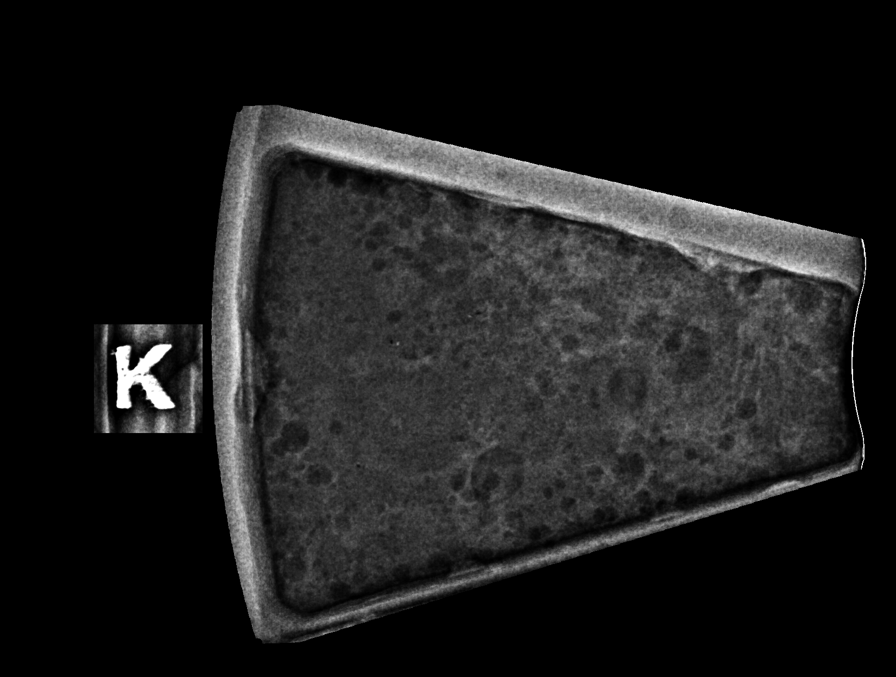

[R (5 of 6)]
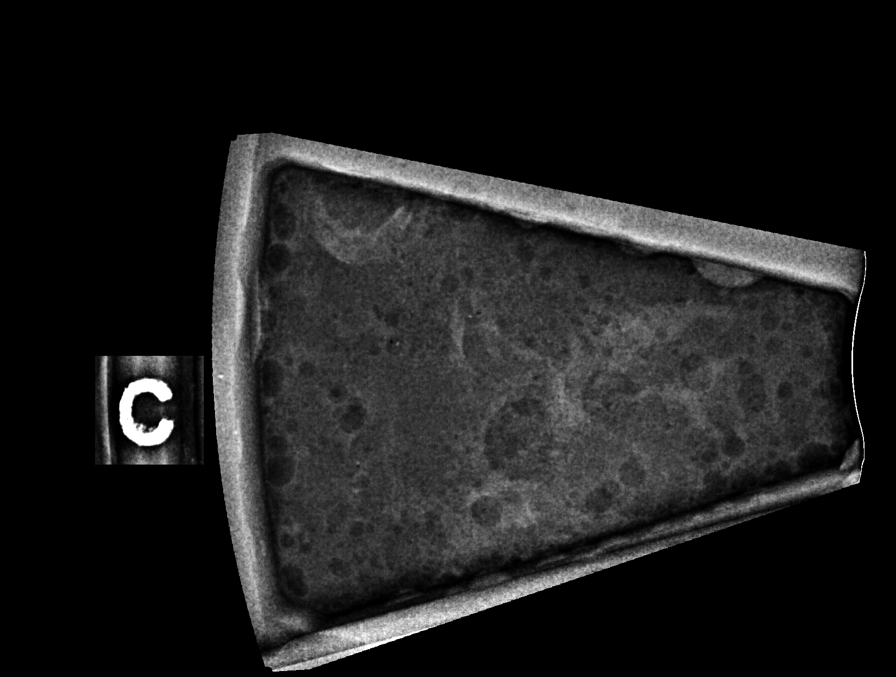

[R (6 of 6)]
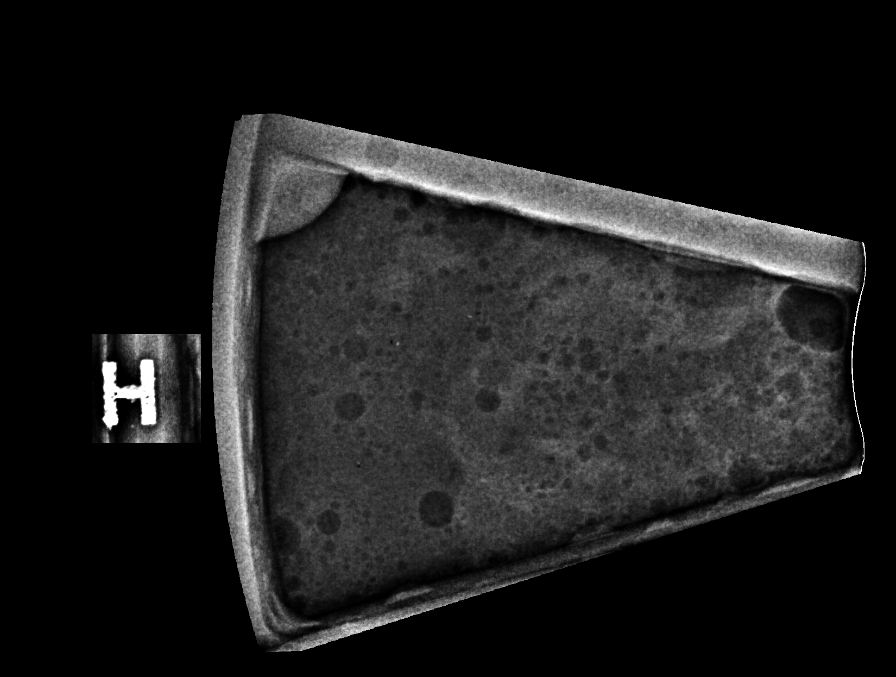

[6 of 12 positions shown; findings below may reference images not displayed]

FINDINGS: The patient and I discussed the procedures of ultrasound-guided and
stereotactic-guided biopsies including benefits and alternatives. We
discussed the high likelihood of successful procedures. We discussed
the risks of the procedures including infection, bleeding, tissue
injury, clip migration, and inadequate sampling. Informed written
consent was given. The usual time out protocol was performed
immediately prior to the procedures.

ULTRASOUND GUIDED LEFT BREAST CORE NEEDLE BIOPSY (RIBBON clip):

Lesion quadrant: UPPER-OUTER LEFT breast

Using sterile technique and 1% Lidocaine with and without
epinephrine as local anesthetic, under direct ultrasound
visualization, a 12 gauge Doro device was used to perform
biopsy of the 1.7 cm mass at the 1 o'clock position of the LEFT
breast using a MEDIAL approach. At the conclusion of the procedure a
RIBBON tissue marker clip was deployed into the biopsy cavity.
Follow up 2 view mammogram was performed and dictated separately.

RIGHT BREAST STEREOTACTIC CORE NEEDLE BIOPSY #1 (UPPER-OUTER RIGHT
breast 14 cm from the nipple-X clip):

Using sterile technique and 1% Lidocaine with and without
epinephrine as local anesthetic, under stereotactic guidance, a 9
gauge vacuum assisted device was used to perform core needle biopsy
of UPPER-OUTER RIGHT breast distortion 14 cm from the nipple using a
SUPERIOR approach. Specimen radiograph was performed.

Lesion quadrant: UPPER-OUTER RIGHT breast

At the conclusion of the procedure, an X tissue marker clip was
deployed into the biopsy cavity. Follow-up 2-view mammogram was
performed and dictated separately.

RIGHT BREAST STEREOTACTIC CORE NEEDLE BIOPSY #1 (UPPER-OUTER RIGHT
breast 12 cm from the nipple-COIL clip):

Using sterile technique and 1% Lidocaine with and without
epinephrine as local anesthetic, under stereotactic guidance, a 9
gauge vacuum assisted device was used to perform core needle biopsy
of UPPER-OUTER RIGHT breast distortion 12 cm from the nipple using a
SUPERIOR approach. Specimen radiograph was performed.

Lesion quadrant: UPPER-OUTER RIGHT breast

At the conclusion of the procedure, a COIL tissue marker clip was
deployed into the biopsy cavity. Follow-up 2-view mammogram was
performed and dictated separately.

RIGHT BREAST STEREOTACTIC CORE NEEDLE BIOPSY #2 (UPPER-OUTER RIGHT
breast distortion 7 cm from the nipple-RIBBON clip):

Using sterile technique and 1% Lidocaine with and without
epinephrine as local anesthetic, under stereotactic guidance, a 9
gauge vacuum assisted device was used to perform core needle biopsy
of UPPER-OUTER RIGHT breast distortion 7 cm from the nipple using a
approach. Specimen radiograph was performed.

Lesion quadrant: UPPER-OUTER RIGHT breast

At the conclusion of the procedure, a RIBBON tissue marker clip was
deployed into the biopsy cavity. Follow-up 2-view mammogram was
performed and dictated separately.

A total of 15 cc of lidocaine without epinephrine and 20 cc of
lidocaine with epinephrine was administered during all procedures.
IMPRESSION: 1. Ultrasound-guided biopsy of 1.7 cm UPPER-OUTER LEFT breast mass
(RIBBON clip).
2. Stereotactic guided biopsy of UPPER-OUTER RIGHT breast distortion
(14 cm from the nipple-X clip).
Stereotactic-guided biopsy of UPPER-OUTER RIGHT breast distortion
(12 cm from the nipple-COIL clip).
3. Stereotactic guided biopsy of UPPER-OUTER RIGHT breast distortion
(7 cm from the nipple-RIBBON clip).
4. No apparent complications.

ADDENDUM:
Pathology revealed GRADE III INVASIVE DUCTAL CARCINOMA WITH
CALCIFICATIONS, FOCAL DUCTAL CARCINOMA IN SITU, HIGH-GRADE of the
LEFT breast, upper outer, (ribbon clip). This was found to be
concordant by Dr. Artika Bc.

Pathology revealed COMPLEX SCLEROSING LESION WITH CALCIFICATIONS,
FIBROCYSTIC CHANGE WITH SCLEROSING ADENOSIS AND USUAL DUCTAL
HYPERPLASIA of the RIGHT breast, upper outer, 63cmfn, (x clip). This
was found to be concordant by Dr. Artika Bc, with excision
recommended.

Pathology revealed COMPLEX SCLEROSING LESION WITH APOCRINE ATYPIA
AND CALCIFICATIONS of the RIGHT breast, upper outer, 13cmfn, (coil
clip). This was found to be concordant by Dr. Artika Bc, with excision
recommended.

Pathology revealed DUCTAL CARCINOMA IN SITU INVOLVING SCLEROSING
ADENOSIS WITH CALCIFICATIONS of the RIGHT breast, upper outer,
3cmfn, (ribbon clip). This was found to be concordant by Dr. Taleski
Goran Geco.

Pathology results were discussed with the patient by telephone. The
patient reported doing well after the biopsies with tenderness and
bruising at the sites, LEFT more than RIGHT. Post biopsy
instructions and care were reviewed and questions were answered. The
patient was encouraged to call [REDACTED] for any additional concerns. My direct phone number was
provided.

The patient was referred to [REDACTED]
[REDACTED] at [REDACTED] on
July 10, 2021.

Pathology results reported by Keshia Borge, RN on 07/02/2021.

*** End of Addendum ***
FINDINGS: The patient and I discussed the procedures of ultrasound-guided and
stereotactic-guided biopsies including benefits and alternatives. We
discussed the high likelihood of successful procedures. We discussed
the risks of the procedures including infection, bleeding, tissue
injury, clip migration, and inadequate sampling. Informed written
consent was given. The usual time out protocol was performed
immediately prior to the procedures.

ULTRASOUND GUIDED LEFT BREAST CORE NEEDLE BIOPSY (RIBBON clip):

Lesion quadrant: UPPER-OUTER LEFT breast

Using sterile technique and 1% Lidocaine with and without
epinephrine as local anesthetic, under direct ultrasound
visualization, a 12 gauge Doro device was used to perform
biopsy of the 1.7 cm mass at the 1 o'clock position of the LEFT
breast using a MEDIAL approach. At the conclusion of the procedure a
RIBBON tissue marker clip was deployed into the biopsy cavity.
Follow up 2 view mammogram was performed and dictated separately.

RIGHT BREAST STEREOTACTIC CORE NEEDLE BIOPSY #1 (UPPER-OUTER RIGHT
breast 14 cm from the nipple-X clip):

Using sterile technique and 1% Lidocaine with and without
epinephrine as local anesthetic, under stereotactic guidance, a 9
gauge vacuum assisted device was used to perform core needle biopsy
of UPPER-OUTER RIGHT breast distortion 14 cm from the nipple using a
SUPERIOR approach. Specimen radiograph was performed.

Lesion quadrant: UPPER-OUTER RIGHT breast

At the conclusion of the procedure, an X tissue marker clip was
deployed into the biopsy cavity. Follow-up 2-view mammogram was
performed and dictated separately.

RIGHT BREAST STEREOTACTIC CORE NEEDLE BIOPSY #1 (UPPER-OUTER RIGHT
breast 12 cm from the nipple-COIL clip):

Using sterile technique and 1% Lidocaine with and without
epinephrine as local anesthetic, under stereotactic guidance, a 9
gauge vacuum assisted device was used to perform core needle biopsy
of UPPER-OUTER RIGHT breast distortion 12 cm from the nipple using a
SUPERIOR approach. Specimen radiograph was performed.

Lesion quadrant: UPPER-OUTER RIGHT breast

At the conclusion of the procedure, a COIL tissue marker clip was
deployed into the biopsy cavity. Follow-up 2-view mammogram was
performed and dictated separately.

RIGHT BREAST STEREOTACTIC CORE NEEDLE BIOPSY #2 (UPPER-OUTER RIGHT
breast distortion 7 cm from the nipple-RIBBON clip):

Using sterile technique and 1% Lidocaine with and without
epinephrine as local anesthetic, under stereotactic guidance, a 9
gauge vacuum assisted device was used to perform core needle biopsy
of UPPER-OUTER RIGHT breast distortion 7 cm from the nipple using a
approach. Specimen radiograph was performed.

Lesion quadrant: UPPER-OUTER RIGHT breast

At the conclusion of the procedure, a RIBBON tissue marker clip was
deployed into the biopsy cavity. Follow-up 2-view mammogram was
performed and dictated separately.

A total of 15 cc of lidocaine without epinephrine and 20 cc of
lidocaine with epinephrine was administered during all procedures.
IMPRESSION: 1. Ultrasound-guided biopsy of 1.7 cm UPPER-OUTER LEFT breast mass
(RIBBON clip).
2. Stereotactic guided biopsy of UPPER-OUTER RIGHT breast distortion
(14 cm from the nipple-X clip).
Stereotactic-guided biopsy of UPPER-OUTER RIGHT breast distortion
(12 cm from the nipple-COIL clip).
3. Stereotactic guided biopsy of UPPER-OUTER RIGHT breast distortion
(7 cm from the nipple-RIBBON clip).
4. No apparent complications.

## 2021-09-25 IMAGING — MG MM BREAST BX W/ LOC DEV EA AD LESION IMAG BX SPEC STEREO GUIDE*R
6 of 19 series · 6 of 39 positions shown · non-contrast
Comparison: Previous exams.
COMPARISON: Previous exams.

Addendum:
CLINICAL DATA: 64-year-old female for tissue sampling of a 1.7 cm
UPPER-OUTER LEFT breast mass and 3 areas of distortion within the
UPPER-OUTER RIGHT breast (7 cm, 12 cm and 14 cm from the nipple).
The patient was initially scheduled for tissue sampling of 2
separate areas of UPPER-OUTER RIGHT breast distortion, but on
preprocedure imaging today for the stereotactic guided RIGHT breast
biopsies, an additional area of distortion was identified within the
UPPER-OUTER RIGHT breast (14 cm from the nipple) and also sampled.

EXAM:
ULTRASOUND GUIDED LEFT BREAST CORE NEEDLE BIOPSY
RIGHT BREAST STEREOTACTIC CORE NEEDLE BIOPSY X 3

[R (1 of 6)]
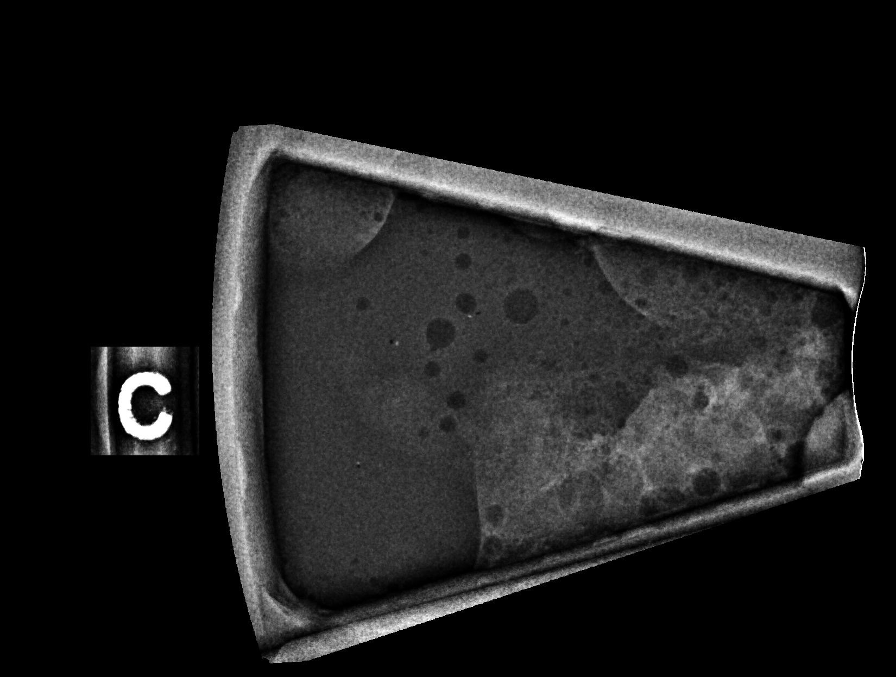

[R (2 of 6)]
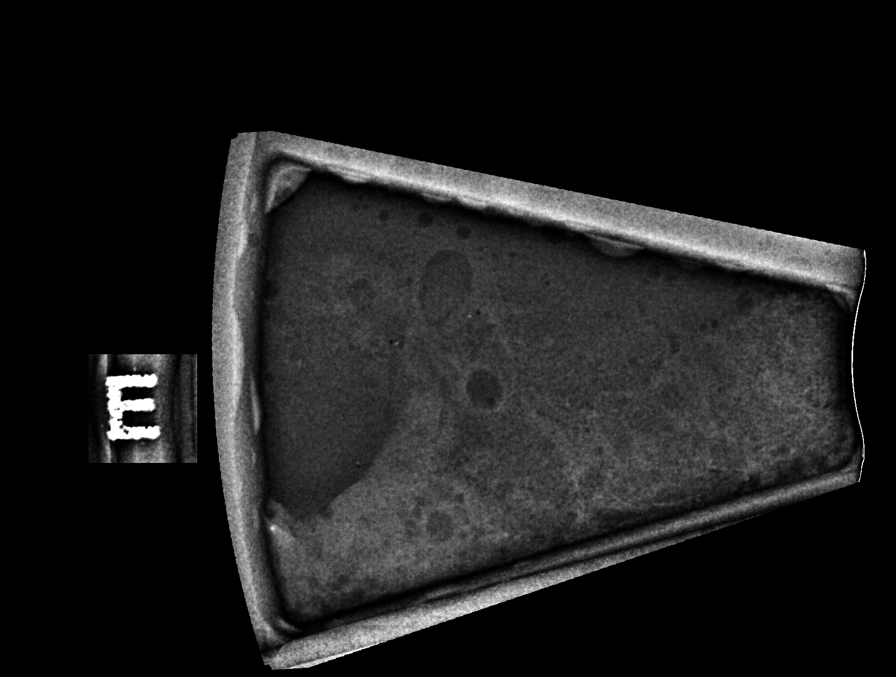

[R (3 of 6)]
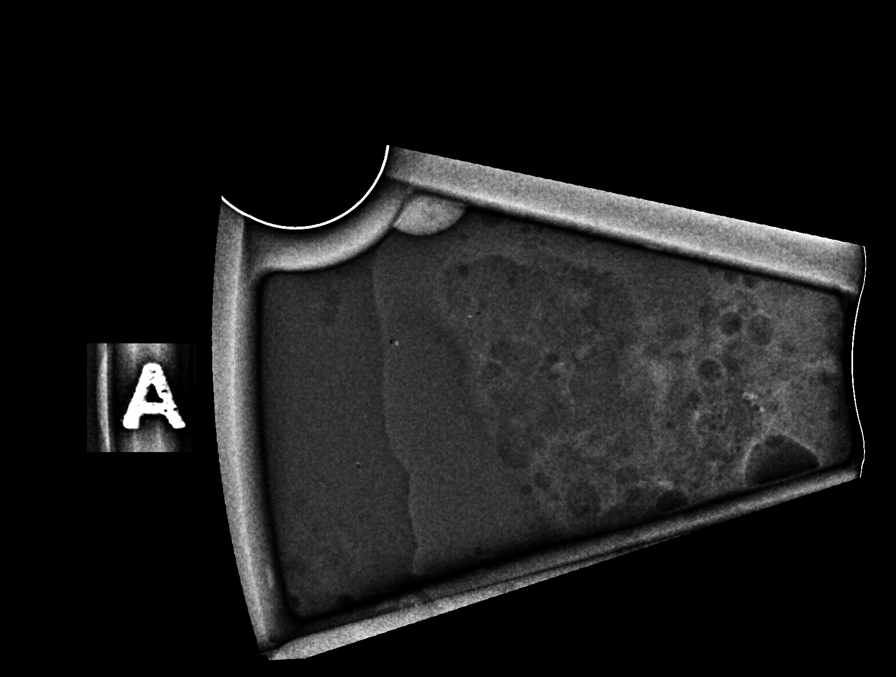

[R (4 of 6)]
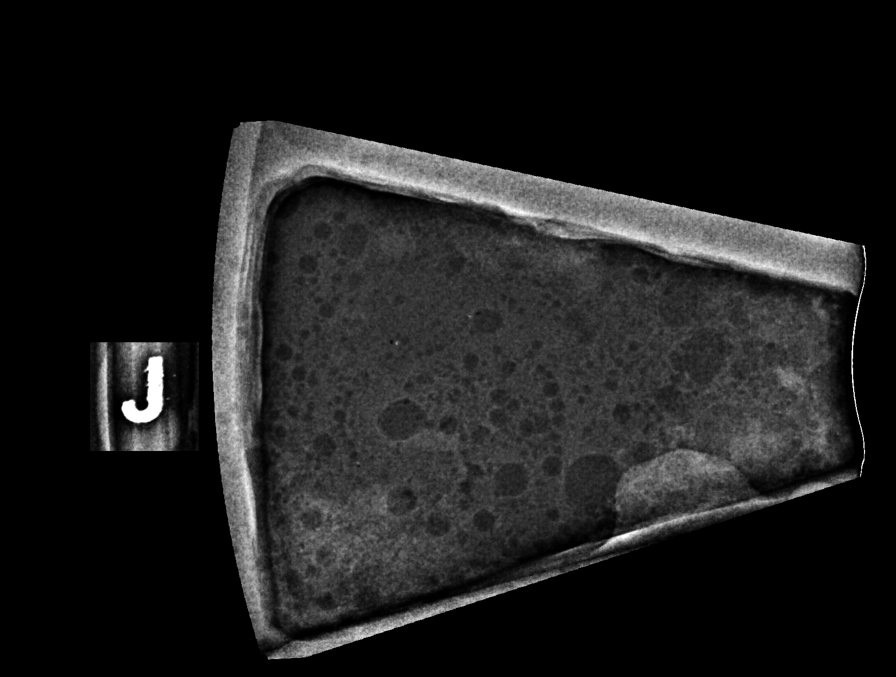

[R (5 of 6)]
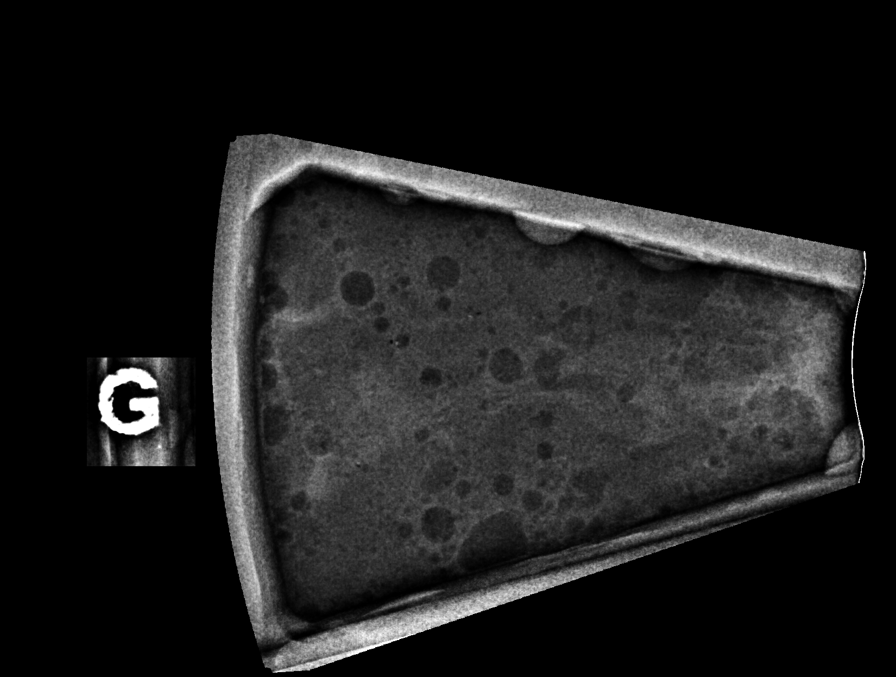

[R (6 of 6)]
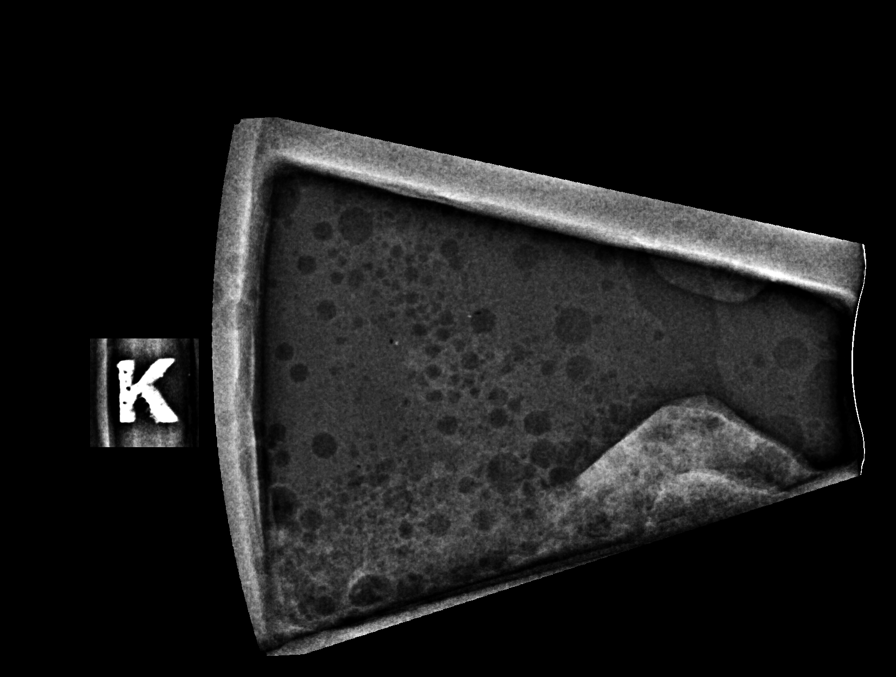

[6 of 39 positions shown; findings below may reference images not displayed]

FINDINGS: The patient and I discussed the procedures of ultrasound-guided and
stereotactic-guided biopsies including benefits and alternatives. We
discussed the high likelihood of successful procedures. We discussed
the risks of the procedures including infection, bleeding, tissue
injury, clip migration, and inadequate sampling. Informed written
consent was given. The usual time out protocol was performed
immediately prior to the procedures.

ULTRASOUND GUIDED LEFT BREAST CORE NEEDLE BIOPSY (RIBBON clip):

Lesion quadrant: UPPER-OUTER LEFT breast

Using sterile technique and 1% Lidocaine with and without
epinephrine as local anesthetic, under direct ultrasound
visualization, a 12 gauge Doro device was used to perform
biopsy of the 1.7 cm mass at the 1 o'clock position of the LEFT
breast using a MEDIAL approach. At the conclusion of the procedure a
RIBBON tissue marker clip was deployed into the biopsy cavity.
Follow up 2 view mammogram was performed and dictated separately.

RIGHT BREAST STEREOTACTIC CORE NEEDLE BIOPSY #1 (UPPER-OUTER RIGHT
breast 14 cm from the nipple-X clip):

Using sterile technique and 1% Lidocaine with and without
epinephrine as local anesthetic, under stereotactic guidance, a 9
gauge vacuum assisted device was used to perform core needle biopsy
of UPPER-OUTER RIGHT breast distortion 14 cm from the nipple using a
SUPERIOR approach. Specimen radiograph was performed.

Lesion quadrant: UPPER-OUTER RIGHT breast

At the conclusion of the procedure, an X tissue marker clip was
deployed into the biopsy cavity. Follow-up 2-view mammogram was
performed and dictated separately.

RIGHT BREAST STEREOTACTIC CORE NEEDLE BIOPSY #1 (UPPER-OUTER RIGHT
breast 12 cm from the nipple-COIL clip):

Using sterile technique and 1% Lidocaine with and without
epinephrine as local anesthetic, under stereotactic guidance, a 9
gauge vacuum assisted device was used to perform core needle biopsy
of UPPER-OUTER RIGHT breast distortion 12 cm from the nipple using a
SUPERIOR approach. Specimen radiograph was performed.

Lesion quadrant: UPPER-OUTER RIGHT breast

At the conclusion of the procedure, a COIL tissue marker clip was
deployed into the biopsy cavity. Follow-up 2-view mammogram was
performed and dictated separately.

RIGHT BREAST STEREOTACTIC CORE NEEDLE BIOPSY #2 (UPPER-OUTER RIGHT
breast distortion 7 cm from the nipple-RIBBON clip):

Using sterile technique and 1% Lidocaine with and without
epinephrine as local anesthetic, under stereotactic guidance, a 9
gauge vacuum assisted device was used to perform core needle biopsy
of UPPER-OUTER RIGHT breast distortion 7 cm from the nipple using a
approach. Specimen radiograph was performed.

Lesion quadrant: UPPER-OUTER RIGHT breast

At the conclusion of the procedure, a RIBBON tissue marker clip was
deployed into the biopsy cavity. Follow-up 2-view mammogram was
performed and dictated separately.

A total of 15 cc of lidocaine without epinephrine and 20 cc of
lidocaine with epinephrine was administered during all procedures.
IMPRESSION: 1. Ultrasound-guided biopsy of 1.7 cm UPPER-OUTER LEFT breast mass
(RIBBON clip).
2. Stereotactic guided biopsy of UPPER-OUTER RIGHT breast distortion
(14 cm from the nipple-X clip).
Stereotactic-guided biopsy of UPPER-OUTER RIGHT breast distortion
(12 cm from the nipple-COIL clip).
3. Stereotactic guided biopsy of UPPER-OUTER RIGHT breast distortion
(7 cm from the nipple-RIBBON clip).
4. No apparent complications.

ADDENDUM:
Pathology revealed GRADE III INVASIVE DUCTAL CARCINOMA WITH
CALCIFICATIONS, FOCAL DUCTAL CARCINOMA IN SITU, HIGH-GRADE of the
LEFT breast, upper outer, (ribbon clip). This was found to be
concordant by Dr. Artika Bc.

Pathology revealed COMPLEX SCLEROSING LESION WITH CALCIFICATIONS,
FIBROCYSTIC CHANGE WITH SCLEROSING ADENOSIS AND USUAL DUCTAL
HYPERPLASIA of the RIGHT breast, upper outer, 63cmfn, (x clip). This
was found to be concordant by Dr. Artika Bc, with excision
recommended.

Pathology revealed COMPLEX SCLEROSING LESION WITH APOCRINE ATYPIA
AND CALCIFICATIONS of the RIGHT breast, upper outer, 13cmfn, (coil
clip). This was found to be concordant by Dr. Artika Bc, with excision
recommended.

Pathology revealed DUCTAL CARCINOMA IN SITU INVOLVING SCLEROSING
ADENOSIS WITH CALCIFICATIONS of the RIGHT breast, upper outer,
3cmfn, (ribbon clip). This was found to be concordant by Dr. Taleski
Goran Geco.

Pathology results were discussed with the patient by telephone. The
patient reported doing well after the biopsies with tenderness and
bruising at the sites, LEFT more than RIGHT. Post biopsy
instructions and care were reviewed and questions were answered. The
patient was encouraged to call [REDACTED] for any additional concerns. My direct phone number was
provided.

The patient was referred to [REDACTED]
[REDACTED] at [REDACTED] on
July 10, 2021.

Pathology results reported by Keshia Borge, RN on 07/02/2021.

*** End of Addendum ***
FINDINGS: The patient and I discussed the procedures of ultrasound-guided and
stereotactic-guided biopsies including benefits and alternatives. We
discussed the high likelihood of successful procedures. We discussed
the risks of the procedures including infection, bleeding, tissue
injury, clip migration, and inadequate sampling. Informed written
consent was given. The usual time out protocol was performed
immediately prior to the procedures.

ULTRASOUND GUIDED LEFT BREAST CORE NEEDLE BIOPSY (RIBBON clip):

Lesion quadrant: UPPER-OUTER LEFT breast

Using sterile technique and 1% Lidocaine with and without
epinephrine as local anesthetic, under direct ultrasound
visualization, a 12 gauge Doro device was used to perform
biopsy of the 1.7 cm mass at the 1 o'clock position of the LEFT
breast using a MEDIAL approach. At the conclusion of the procedure a
RIBBON tissue marker clip was deployed into the biopsy cavity.
Follow up 2 view mammogram was performed and dictated separately.

RIGHT BREAST STEREOTACTIC CORE NEEDLE BIOPSY #1 (UPPER-OUTER RIGHT
breast 14 cm from the nipple-X clip):

Using sterile technique and 1% Lidocaine with and without
epinephrine as local anesthetic, under stereotactic guidance, a 9
gauge vacuum assisted device was used to perform core needle biopsy
of UPPER-OUTER RIGHT breast distortion 14 cm from the nipple using a
SUPERIOR approach. Specimen radiograph was performed.

Lesion quadrant: UPPER-OUTER RIGHT breast

At the conclusion of the procedure, an X tissue marker clip was
deployed into the biopsy cavity. Follow-up 2-view mammogram was
performed and dictated separately.

RIGHT BREAST STEREOTACTIC CORE NEEDLE BIOPSY #1 (UPPER-OUTER RIGHT
breast 12 cm from the nipple-COIL clip):

Using sterile technique and 1% Lidocaine with and without
epinephrine as local anesthetic, under stereotactic guidance, a 9
gauge vacuum assisted device was used to perform core needle biopsy
of UPPER-OUTER RIGHT breast distortion 12 cm from the nipple using a
SUPERIOR approach. Specimen radiograph was performed.

Lesion quadrant: UPPER-OUTER RIGHT breast

At the conclusion of the procedure, a COIL tissue marker clip was
deployed into the biopsy cavity. Follow-up 2-view mammogram was
performed and dictated separately.

RIGHT BREAST STEREOTACTIC CORE NEEDLE BIOPSY #2 (UPPER-OUTER RIGHT
breast distortion 7 cm from the nipple-RIBBON clip):

Using sterile technique and 1% Lidocaine with and without
epinephrine as local anesthetic, under stereotactic guidance, a 9
gauge vacuum assisted device was used to perform core needle biopsy
of UPPER-OUTER RIGHT breast distortion 7 cm from the nipple using a
approach. Specimen radiograph was performed.

Lesion quadrant: UPPER-OUTER RIGHT breast

At the conclusion of the procedure, a RIBBON tissue marker clip was
deployed into the biopsy cavity. Follow-up 2-view mammogram was
performed and dictated separately.

A total of 15 cc of lidocaine without epinephrine and 20 cc of
lidocaine with epinephrine was administered during all procedures.
IMPRESSION: 1. Ultrasound-guided biopsy of 1.7 cm UPPER-OUTER LEFT breast mass
(RIBBON clip).
2. Stereotactic guided biopsy of UPPER-OUTER RIGHT breast distortion
(14 cm from the nipple-X clip).
Stereotactic-guided biopsy of UPPER-OUTER RIGHT breast distortion
(12 cm from the nipple-COIL clip).
3. Stereotactic guided biopsy of UPPER-OUTER RIGHT breast distortion
(7 cm from the nipple-RIBBON clip).
4. No apparent complications.

## 2021-09-26 ENCOUNTER — Other Ambulatory Visit: Payer: Self-pay

## 2021-09-26 ENCOUNTER — Encounter: Payer: Self-pay | Admitting: Physical Therapy

## 2021-09-26 ENCOUNTER — Ambulatory Visit: Payer: PPO | Attending: General Surgery | Admitting: Physical Therapy

## 2021-09-26 DIAGNOSIS — M25611 Stiffness of right shoulder, not elsewhere classified: Secondary | ICD-10-CM | POA: Insufficient documentation

## 2021-09-26 DIAGNOSIS — R600 Localized edema: Secondary | ICD-10-CM | POA: Insufficient documentation

## 2021-09-26 DIAGNOSIS — C50412 Malignant neoplasm of upper-outer quadrant of left female breast: Secondary | ICD-10-CM | POA: Diagnosis present

## 2021-09-26 DIAGNOSIS — M25612 Stiffness of left shoulder, not elsewhere classified: Secondary | ICD-10-CM | POA: Insufficient documentation

## 2021-09-26 DIAGNOSIS — Z170 Estrogen receptor positive status [ER+]: Secondary | ICD-10-CM | POA: Insufficient documentation

## 2021-09-26 DIAGNOSIS — D0511 Intraductal carcinoma in situ of right breast: Secondary | ICD-10-CM | POA: Insufficient documentation

## 2021-09-26 DIAGNOSIS — R293 Abnormal posture: Secondary | ICD-10-CM | POA: Insufficient documentation

## 2021-09-26 NOTE — Therapy (Signed)
[facility] @ [place], [place], [address] Phone: [phone]   Fax:  [phone]  Physical Therapy Treatment  Patient Details  Name: April [name] MRN: [id number] Date of Birth: [birth year] Referring Provider (PT): Dr. Autumn Messing   Encounter Date: 09/26/2021   PT End of Session - 09/26/21 1622     Visit Number 2    Number of Visits 10    Date for PT Re-Evaluation 10/24/21    PT Start Time 44    PT Stop Time 4967    PT Time Calculation (min) 36 min    Activity Tolerance Patient tolerated treatment well    Behavior During Therapy [facility] for tasks assessed/performed             Past Medical History:  Diagnosis Date   Anxiety    Arthritis    Cancer ([place])    COPD (chronic obstructive pulmonary disease) ([place])    Depression    GERD (gastroesophageal reflux disease)    Headache(784.0)    Hyperlipidemia    Hypertension    Incontinent of urine    Shortness of breath    Stroke [facility])     Past Surgical History:  Procedure Laterality Date   CAROTID STENT     Has known BICA occlusions since 2011; s/p left vertebral artery stent 12/26/09 & right VA stent 02/12/10   CHOLECYSTECTOMY     PORTACATH PLACEMENT Right 08/19/2021   Procedure: INSERTION PORT-A-CATH WITH ULTRASOUND;  Surgeon: [name], MD;  Location: [place];  Service: General;  Laterality: Right;   ROTATOR CUFF REPAIR     SENTINEL NODE BIOPSY Bilateral 08/19/2021   Procedure: BILATERAL SENTINEL NODE BIOPSY;  Surgeon: [name], MD;  Location: [place];  Service: General;  Laterality: Bilateral;   TOTAL MASTECTOMY Bilateral 08/19/2021   Procedure: BILATERAL TOTAL MASTECTOMY;  Surgeon: [name], MD;  Location: [place];  Service: General;  Laterality: Bilateral;   TUBAL LIGATION      There were no vitals filed for this visit.   Subjective Assessment - 09/26/21 1621     Subjective Pt reports her ROM is pretty good but she has pain and  tightness in the middle of the chest. Yesterday I took my own trash out and did my own laundry.    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 The left breast mass measures 1.7 cm and is located in the upper outer quadrant. It is triple positive with a Ki67 of 40%. The right breast DCIS is ER/PR positive. 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 She smokes 1 pack/day; bilateral mastectomies on 08/19/21 R 0/4 nodes L 0/3 nodes, TIA, CHF, polycythemia    Patient Stated Goals to do what you want me to do and get out of here                [facility] PT Assessment - 09/26/21 1627       Balance Screen   Has the patient fallen in the past 6 months No    Has the patient had a decrease in activity level because of a fear of falling?  No    Is the patient reluctant to leave their home because of a fear of falling?  No      Observation/Other Assessments   Observations rippling edema across bilateral chest  AROM   Right Shoulder Extension 72 Degrees    Right Shoulder Flexion 147 Degrees    Right Shoulder ABduction 170 Degrees    Right Shoulder Internal Rotation 44 Degrees    Right Shoulder External Rotation 59 Degrees    Left Shoulder Extension 74 Degrees    Left Shoulder Flexion 140 Degrees    Left Shoulder ABduction 126 Degrees    Left Shoulder Internal Rotation 47 Degrees    Left Shoulder External Rotation 72 Degrees               LYMPHEDEMA/ONCOLOGY QUESTIONNAIRE - 09/26/21 1628       Right Upper Extremity Lymphedema   10 cm Proximal to Olecranon Process 32.1 cm    Olecranon Process 27.7 cm    10 cm Proximal to Ulnar Styloid Process 23.4 cm    Just Proximal to Ulnar Styloid Process 18 cm    Across Hand at [employer] 20.2 cm    At [place] of 2nd Digit 6.1 cm      Left Upper Extremity Lymphedema   10 cm Proximal to Olecranon  Process 31.5 cm    Olecranon Process 28.5 cm    10 cm Proximal to Ulnar Styloid Process 23 cm    Just Proximal to Ulnar Styloid Process 18.8 cm    Across Hand at [employer] 19.6 cm    At [place] of 2nd Digit 6.3 cm                                 PT Education - 09/26/21 1621     Education Details anatomy and physiology of the lymphatic system, lymphedema risk reduction practices, ABC class, wear compression bra due to swelling    Person(s) Educated Patient    Methods Explanation;Handout    Comprehension Verbalized understanding                 PT Long Term Goals - 09/26/21 1622       PT LONG TERM GOAL #1   Title Patient will demonstrate she has regained full shoulder ROM and function post operatively compared to baselines.    Time 8    Period Weeks    Status On-going      PT LONG TERM GOAL #2   Title Pt will not demonstrate any rippling edema across bilateral chest to decrease risk of infection.    Time 4    Period Weeks    Status New      PT LONG TERM GOAL #3   Title Pt will be independent in self MLD for long term management of edema    Time 4    Period Weeks    Status New      PT LONG TERM GOAL #4   Title Pt will be independent in a home exercise program for continued stretching and strengthening    Time 4    Period Weeks    Status New      PT LONG TERM GOAL #5   Title Pt will report 75% improvement in tightness and discomfort across medial chest to allow improved comfort.    Time 4    Period Weeks    Status New                   Plan - 09/26/21 1623     Clinical Impression Statement Pt returns to PT after undergoing bilateral mastectomies and  sentinel lymph node biopsies (R0/4 L 0/3). Pt demonstrates rippling edema across bilateral chest. Will send her surgeon an inbasket message regarding this. Her scars are haling well. She has increased sensitivity across medial chest with increased tightness. Educated pt to start  wearing her compression bra again to assist with swelling. Her ROM has mostly improved from baseline but she is limited with R shoulder external rotation. Pt would benefit from skilled PT services to decrease edema, improve shoulder ROM and isntruct pt in a home exercise program for stretching and strengthening.    Rehab Potential Good    PT Frequency 2x / week    PT Duration 4 weeks    PT Treatment/Interventions ADLs/Self Care Home Management;Therapeutic exercise;Patient/family education;Manual techniques;Manual lymph drainage;Compression bandaging;Passive range of motion    PT Next Visit Plan begin MLD across bilateral chest and instruct pt, give bilateral shoulder ROM exercises and eventually supine scap    PT Home Exercise Plan Post op HEP    Consulted and Agree with Plan of Care Patient             Patient will benefit from skilled therapeutic intervention in order to improve the following deficits and impairments:  Postural dysfunction, Decreased range of motion, Decreased knowledge of precautions, Pain, Increased edema  Visit Diagnosis: Stiffness of right shoulder, not elsewhere classified  Stiffness of left shoulder, not elsewhere classified  Abnormal posture  Localized edema  Ductal carcinoma in situ (DCIS) of right breast  Malignant neoplasm of upper-outer quadrant of left breast in female, estrogen receptor positive ([place])     Problem List Patient Active Problem List   Diagnosis Date Noted   Bilateral breast cancer ([place]) 08/19/2021   Genetic testing 07/26/2021   Family history of breast cancer 07/10/2021   Family history of bone cancer 07/10/2021   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   Malignant neoplasm of upper-outer quadrant of left breast in female, estrogen receptor positive ([place]) 07/08/2021   Back pain 04/04/2016   Weakness    Confusion    Orthostatic dizziness    Left-sided weakness 11/10/2015   Polycythemia 11/10/2015   Chronic  diastolic CHF (congestive heart failure) ([place]) 11/10/2015   Hypercarbia 11/10/2015   Respiratory acidosis 11/10/2015   Paresthesias in right hand 07/11/2015   Migraine without aura 01/30/2014   Occlusion of extracranial carotid artery 01/30/2014   Transient cerebral ischemia 01/30/2014   Headache(784.0) 01/04/2013   Dizziness 01/04/2013   TIA (transient ischemic attack) 01/04/2013   Migraine 01/04/2013   COPD (chronic obstructive pulmonary disease) ([place]) 01/04/2013    Allyson Sabal [place], PT 09/26/2021, 4:28 PM  [facility] @ [place], [place], [address] Phone: [phone]   Fax:  [phone]  Name: April [name] MRN: [id number] Date of Birth: [birth year]  Manus Gunning, PT 09/26/21 4:28 PM

## 2021-09-26 NOTE — Therapy (Signed)
[facility] [place], [place], [address] Phone: [phone]   Fax:  [phone]  Physical Therapy Treatment  Patient Details  Name: April [name] MRN: [id number] Date of Birth: 1956/09/03 Referring Provider (PT): Dr. Autumn Messing   Encounter Date: 07/16/2021   PT End of Session - 09/26/21 1544     Visit Number 2    Number of Visits 10    Date for PT Re-Evaluation 10/24/21    PT Start Time 55    PT Stop Time 4765    PT Time Calculation (min) 36 min    Activity Tolerance Patient tolerated treatment well    Behavior During Therapy [facility] for tasks assessed/performed             Past Medical History:  Diagnosis Date   Anxiety    Arthritis    Cancer ([place])    COPD (chronic obstructive pulmonary disease) ([place])    Depression    GERD (gastroesophageal reflux disease)    Headache(784.0)    Hyperlipidemia    Hypertension    Incontinent of urine    Shortness of breath    Stroke [facility])     Past Surgical History:  Procedure Laterality Date   CAROTID STENT     Has known BICA occlusions since 2011; s/p left vertebral artery stent 12/26/09 & right VA stent 02/12/10   CHOLECYSTECTOMY     PORTACATH PLACEMENT Right 08/19/2021   Procedure: INSERTION PORT-A-CATH WITH ULTRASOUND;  Surgeon: [name], MD;  Location: [place];  Service: General;  Laterality: Right;   ROTATOR CUFF REPAIR     SENTINEL NODE BIOPSY Bilateral 08/19/2021   Procedure: BILATERAL SENTINEL NODE BIOPSY;  Surgeon: [name], MD;  Location: [place];  Service: General;  Laterality: Bilateral;   TOTAL MASTECTOMY Bilateral 08/19/2021   Procedure: BILATERAL TOTAL MASTECTOMY;  Surgeon: [name], MD;  Location: [place];  Service: General;  Laterality: Bilateral;   TUBAL LIGATION         Subjective Assessment - 09/26/21 1513     Subjective Pt reports her ROM is pretty good but she has pain and tightness in the middle of the chest. Yesterday I took my own  trash out and did my own laundry.    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 The left breast mass measures 1.7 cm and is located in the upper outer quadrant. It is triple positive with a Ki67 of 40%. The right breast DCIS is ER/PR positive. 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 She smokes 1 pack/day; bilateral mastectomies on 08/19/21 R 0/4 nodes L 0/3 nodes, TIA, CHF, polycythemia    Patient Stated Goals to do what you want me to do and get out of here    Currently in Pain? No/denies    Pain Score 0-No pain                [facility] PT Assessment - 09/26/21 0001       Balance Screen   Has the patient fallen in the past 6 months No    Has the patient had a decrease in activity level because of a fear of falling?  No    Is the patient reluctant to leave their home because of a fear of falling?  No      Observation/Other Assessments  Observations rippling edema across bilateral chest      AROM   Right Shoulder Extension 72 Degrees    Right Shoulder Flexion 147 Degrees    Right Shoulder ABduction 170 Degrees    Right Shoulder Internal Rotation 44 Degrees    Right Shoulder External Rotation 59 Degrees    Left Shoulder Extension 74 Degrees    Left Shoulder Flexion 140 Degrees    Left Shoulder ABduction 126 Degrees    Left Shoulder Internal Rotation 47 Degrees    Left Shoulder External Rotation 72 Degrees               LYMPHEDEMA/ONCOLOGY QUESTIONNAIRE - 09/26/21 0001       Right Upper Extremity Lymphedema   10 cm Proximal to Olecranon Process 32.1 cm    Olecranon Process 27.7 cm    10 cm Proximal to Ulnar Styloid Process 23.4 cm    Just Proximal to Ulnar Styloid Process 18 cm    Across Hand at [employer] 20.2 cm    At [place] of 2nd Digit 6.1 cm      Left Upper Extremity Lymphedema   10 cm Proximal to Olecranon  Process 31.5 cm    Olecranon Process 28.5 cm    10 cm Proximal to Ulnar Styloid Process 23 cm    Just Proximal to Ulnar Styloid Process 18.8 cm    Across Hand at [employer] 19.6 cm    At [place] of 2nd Digit 6.3 cm                                 PT Education - 09/26/21 1540     Education Details anatomy and physiology of the lymphatic system, lymphedema risk reduction practices, ABC class, wear compression bra due to swelling    Person(s) Educated Patient    Methods Explanation;Handout    Comprehension Verbalized understanding                 PT Long Term Goals - 09/26/21 1542       PT LONG TERM GOAL #1   Title Patient will demonstrate she has regained full shoulder ROM and function post operatively compared to baselines.    Time 8    Period Weeks    Status On-going      PT LONG TERM GOAL #2   Title Pt will not demonstrate any rippling edema across bilateral chest to decrease risk of infection.    Time 4    Period Weeks    Status New    Target Date 10/24/21      PT LONG TERM GOAL #3   Title Pt will be independent in self MLD for long term management of edema    Time 4    Period Weeks    Status New    Target Date 10/24/21      PT LONG TERM GOAL #4   Title Pt will be independent in a home exercise program for continued stretching and strengthening    Time 4    Period Weeks    Status New    Target Date 10/24/21      PT LONG TERM GOAL #5   Title Pt will report 75% improvement in tightness and discomfort across medial chest to allow improved comfort.    Time 4    Period Weeks    Status New    Target Date 10/24/21  Plan - 09/26/21 1545     Clinical Impression Statement Pt returns to PT after undergoing bilateral mastectomies and sentinel lymph node biopsies (R 0/4, L 0/3). Pt demonstrate rippling edema across bilateral chest. Will send her surgeon an inbasket message regarding this. Her scars are healing  well. She has increased sensitivity across medial chest with increased tightness. Her ROM has mostly improved from baseline but she is limited with R shoulder external rotation. Pt would benefit from skilled PT services to decrease edema, improve shoulder ROM and instruct pt in a home exercise program for stretching and strengthening.    Rehab Potential Good    PT Frequency 2x / week    PT Duration 4 weeks    PT Treatment/Interventions ADLs/Self Care Home Management;Therapeutic exercise;Patient/family education;Manual techniques;Manual lymph drainage;Compression bandaging;Passive range of motion    PT Next Visit Plan begin MLD across bilateral chest and instruct pt, give bilateral shoulder ROM exercises and eventually supine scap    PT Home Exercise Plan Post op HEP    Consulted and Agree with Plan of Care Patient             Patient will benefit from skilled therapeutic intervention in order to improve the following deficits and impairments:  Postural dysfunction, Decreased range of motion, Decreased knowledge of precautions, Pain, Increased edema  Visit Diagnosis: Malignant neoplasm of upper-outer quadrant of left breast in female, estrogen receptor positive ([place])  Ductal carcinoma in situ (DCIS) of right breast  TIA (transient ischemic attack)  Chronic diastolic CHF (congestive heart failure) ([place])  Simple chronic bronchitis ([place])  Polycythemia     Problem List Patient Active Problem List   Diagnosis Date Noted   Bilateral breast cancer ([place]) 08/19/2021   Genetic testing 07/26/2021   Family history of breast cancer 07/10/2021   Family history of bone cancer 07/10/2021   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   Malignant neoplasm of upper-outer quadrant of left breast in female, estrogen receptor positive ([place]) 07/08/2021   Back pain 04/04/2016   Weakness    Confusion    Orthostatic dizziness    Left-sided weakness 11/10/2015   Polycythemia  11/10/2015   Chronic diastolic CHF (congestive heart failure) ([place]) 11/10/2015   Hypercarbia 11/10/2015   Respiratory acidosis 11/10/2015   Paresthesias in right hand 07/11/2015   Migraine without aura 01/30/2014   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 [place], [place], [address] Phone: [phone]   Fax:  [phone]  Name: April [name] MRN: [id number] Date of Birth: 12/25/1955

## 2021-09-26 NOTE — Patient Instructions (Signed)
[facility]  [address], [address]  [place] [address]  [phone]  After Breast Cancer Class It is recommended you attend the ABC class to be educated on lymphedema risk reduction. This class is free of charge and lasts for 1 hour. It is a 1-time class. You will need to download the Webex app either on your phone or computer. We will send you a link the night before or the morning of the class. You should be able to click on that link to join the class. This is not a confidential class. You don't have to turn your camera on, but other participants may be able to see your email address.  Scar massage You can begin gentle scar massage to you incision sites. Gently place one hand on the incision and move the skin (without sliding on the skin) in various directions. Do this for a few minutes and then you can gently massage either coconut oil or vitamin E cream into the scars.  Compression garment You should continue wearing your compression bra until you feel like you no longer have swelling.  Home exercise Program Continue doing the exercises you were given until you feel like you can do them without feeling any tightness at the end.   Walking Program Studies show that 30 minutes of walking per day (fast enough to elevate your heart rate) can significantly reduce the risk of a cancer recurrence. If you can't walk due to other medical reasons, we encourage you to find another activity you could do (like a stationary bike or water exercise).  Posture After breast cancer surgery, people frequently sit with rounded shoulders posture because it puts their incisions on slack and feels better. If you sit like this and scar tissue forms in that position, you can become very tight and have pain sitting or standing with good posture. Try to be aware of your posture and sit and stand up tall to heal properly.  Follow up PT: It is recommended you return every 3 months for the  first 3 years following surgery to be assessed on the SOZO machine for an L-Dex score. This helps prevent clinically significant lymphedema in 95% of patients. These follow up screens are 10 minute appointments that you are not billed for. 

## 2021-09-26 NOTE — Therapy (Signed)
[facility] @ [place], [place], [address] Phone: [phone]   Fax:  [phone]  Physical Therapy Treatment  Patient Details  Name: April [name] MRN: [id number] Date of Birth: 1955/11/30 Referring Provider (PT): April [name]   Encounter Date: 09/26/2021    Past Medical History:  Diagnosis Date   Anxiety    Arthritis    Cancer ([place])    COPD (chronic obstructive pulmonary disease) ([place])    Depression    GERD (gastroesophageal reflux disease)    Headache(784.0)    Hyperlipidemia    Hypertension    Incontinent of urine    Shortness of breath    Stroke [facility])     Past Surgical History:  Procedure Laterality Date   CAROTID STENT     Has known BICA occlusions since 2011; s/p left vertebral artery stent 12/26/09 & right VA stent 02/12/10   CHOLECYSTECTOMY     PORTACATH PLACEMENT Right 08/19/2021   Procedure: INSERTION PORT-A-CATH WITH ULTRASOUND;  Surgeon: April Kussmaul, MD;  Location: [place];  Service: General;  Laterality: Right;   ROTATOR CUFF REPAIR     SENTINEL NODE BIOPSY Bilateral 08/19/2021   Procedure: BILATERAL SENTINEL NODE BIOPSY;  Surgeon: April Kussmaul, MD;  Location: [place];  Service: General;  Laterality: Bilateral;   TOTAL MASTECTOMY Bilateral 08/19/2021   Procedure: BILATERAL TOTAL MASTECTOMY;  Surgeon: April Kussmaul, MD;  Location: [place];  Service: General;  Laterality: Bilateral;   TUBAL LIGATION      There were no vitals filed for this visit.   Subjective Assessment - 09/26/21 1503     Subjective I am sore after surgery. I have been doing good with lifting my arms. The soreness is across my chest.    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 The left breast mass measures 1.7 cm and is located in the upper outer quadrant. It is triple positive with a Ki67 of 40%. The right breast DCIS is  ER/PR positive. 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 She smokes 1 pack/day. Bilateral total mastectomy with                                             PT Long Term Goals - 09/26/21 1542       PT LONG TERM GOAL #1   Title Patient will demonstrate she has regained full shoulder ROM and function post operatively compared to baselines.    Time 8    Period Weeks    Status On-going      PT LONG TERM GOAL #2   Title Pt will not demonstrate any rippling edema across bilateral chest to decrease risk of infection.    Time 4    Period Weeks    Status New    Target Date 10/24/21      PT LONG TERM GOAL #3   Title Pt will be independent in self MLD for long term management of edema    Time 4    Period Weeks    Status New    Target Date 10/24/21      PT LONG TERM GOAL #4   Title Pt will be independent in a home exercise program for continued  stretching and strengthening    Time 4    Period Weeks    Status New    Target Date 10/24/21      PT LONG TERM GOAL #5   Title Pt will report 75% improvement in tightness and discomfort across medial chest to allow improved comfort.    Time 4    Period Weeks    Status New    Target Date 10/24/21                    Patient will benefit from skilled therapeutic intervention in order to improve the following deficits and impairments:     Visit Diagnosis: Stiffness of right shoulder, not elsewhere classified  Stiffness of left shoulder, not elsewhere classified  Abnormal posture  Localized edema  Ductal carcinoma in situ (DCIS) of right breast  Malignant neoplasm of upper-outer quadrant of left breast in female, estrogen receptor positive ([place])     Problem List Patient Active Problem List   Diagnosis Date Noted   Bilateral breast cancer ([place]) 08/19/2021   Genetic testing 07/26/2021   Family history of breast cancer 07/10/2021    Family history of bone cancer 07/10/2021   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   Malignant neoplasm of upper-outer quadrant of left breast in female, estrogen receptor positive ([place]) 07/08/2021   Back pain 04/04/2016   Weakness    Confusion    Orthostatic dizziness    Left-sided weakness 11/10/2015   Polycythemia 11/10/2015   Chronic diastolic CHF (congestive heart failure) ([place]) 11/10/2015   Hypercarbia 11/10/2015   Respiratory acidosis 11/10/2015   Paresthesias in right hand 07/11/2015   Migraine without aura 01/30/2014   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

## 2021-10-01 ENCOUNTER — Encounter: Payer: Self-pay | Admitting: Hematology and Oncology

## 2021-10-01 ENCOUNTER — Other Ambulatory Visit: Payer: Self-pay

## 2021-10-01 DIAGNOSIS — C50412 Malignant neoplasm of upper-outer quadrant of left female breast: Secondary | ICD-10-CM

## 2021-10-01 DIAGNOSIS — Z170 Estrogen receptor positive status [ER+]: Secondary | ICD-10-CM

## 2021-10-01 NOTE — Progress Notes (Signed)
Called pt to introduce myself as her Arboriculturist.  Unfortunately there aren't any foundations offering copay assistance for her Dx and the type of ins she has.  I informed her of the [employer], went over what it covers and gave her the income requirement.  Pt would like to apply so she will bring her proof of income on 10/02/21.  If approved I will give her an expense sheet and my card for any questions or concerns she may have in the future.

## 2021-10-02 ENCOUNTER — Inpatient Hospital Stay: Payer: PPO

## 2021-10-02 ENCOUNTER — Inpatient Hospital Stay (HOSPITAL_BASED_OUTPATIENT_CLINIC_OR_DEPARTMENT_OTHER): Payer: PPO | Admitting: Adult Health

## 2021-10-02 ENCOUNTER — Inpatient Hospital Stay: Payer: PPO | Attending: Hematology and Oncology

## 2021-10-02 ENCOUNTER — Other Ambulatory Visit: Payer: PPO

## 2021-10-02 ENCOUNTER — Encounter: Payer: Self-pay | Admitting: *Deleted

## 2021-10-02 ENCOUNTER — Encounter: Payer: Self-pay | Admitting: Hematology and Oncology

## 2021-10-02 ENCOUNTER — Other Ambulatory Visit: Payer: Self-pay

## 2021-10-02 ENCOUNTER — Encounter: Payer: Self-pay | Admitting: Adult Health

## 2021-10-02 VITALS — BP 163/78 | HR 89 | Temp 97.2°F | Resp 18 | Ht 63.0 in | Wt 210.0 lb

## 2021-10-02 VITALS — BP 139/89 | HR 79 | Temp 98.0°F | Resp 18

## 2021-10-02 DIAGNOSIS — Z170 Estrogen receptor positive status [ER+]: Secondary | ICD-10-CM | POA: Diagnosis not present

## 2021-10-02 DIAGNOSIS — Z5112 Encounter for antineoplastic immunotherapy: Secondary | ICD-10-CM | POA: Diagnosis not present

## 2021-10-02 DIAGNOSIS — C50412 Malignant neoplasm of upper-outer quadrant of left female breast: Secondary | ICD-10-CM | POA: Insufficient documentation

## 2021-10-02 DIAGNOSIS — Z95828 Presence of other vascular implants and grafts: Secondary | ICD-10-CM | POA: Insufficient documentation

## 2021-10-02 DIAGNOSIS — C50912 Malignant neoplasm of unspecified site of left female breast: Secondary | ICD-10-CM | POA: Diagnosis not present

## 2021-10-02 DIAGNOSIS — C50911 Malignant neoplasm of unspecified site of right female breast: Secondary | ICD-10-CM

## 2021-10-02 DIAGNOSIS — D0511 Intraductal carcinoma in situ of right breast: Secondary | ICD-10-CM | POA: Diagnosis not present

## 2021-10-02 LAB — CMP (CANCER CENTER ONLY)
ALT: 11 U/L (ref 0–44)
AST: 11 U/L — ABNORMAL LOW (ref 15–41)
Albumin: 3.5 g/dL (ref 3.5–5.0)
Alkaline Phosphatase: 35 U/L — ABNORMAL LOW (ref 38–126)
Anion gap: 11 (ref 5–15)
BUN: 11 mg/dL (ref 8–23)
CO2: 25 mmol/L (ref 22–32)
Calcium: 8.7 mg/dL — ABNORMAL LOW (ref 8.9–10.3)
Chloride: 102 mmol/L (ref 98–111)
Creatinine: 0.94 mg/dL (ref 0.44–1.00)
GFR, Estimated: >60 mL/min (ref >60)
Glucose, Bld: 113 mg/dL — ABNORMAL HIGH (ref 70–99)
Potassium: 4.1 mmol/L (ref 3.5–5.1)
Sodium: 138 mmol/L (ref 135–145)
Total Bilirubin: 0.3 mg/dL (ref 0.3–1.2)
Total Protein: 6.9 g/dL (ref 6.5–8.1)

## 2021-10-02 LAB — CBC WITH DIFFERENTIAL (CANCER CENTER ONLY)
Abs Immature Granulocytes: 0.05 10*3/uL (ref 0.00–0.07)
Basophils Absolute: 0 10*3/uL (ref 0.0–0.1)
Basophils Relative: 0 %
Eosinophils Absolute: 0.2 10*3/uL (ref 0.0–0.5)
Eosinophils Relative: 2 %
HCT: 47.4 % — ABNORMAL HIGH (ref 36.0–46.0)
Hemoglobin: 15.3 g/dL — ABNORMAL HIGH (ref 12.0–15.0)
Immature Granulocytes: 1 %
Lymphocytes Relative: 32 %
Lymphs Abs: 3 10*3/uL (ref 0.7–4.0)
MCH: 27.8 pg (ref 26.0–34.0)
MCHC: 32.3 g/dL (ref 30.0–36.0)
MCV: 86.2 fL (ref 80.0–100.0)
Monocytes Absolute: 0.6 10*3/uL (ref 0.1–1.0)
Monocytes Relative: 6 %
Neutro Abs: 5.7 10*3/uL (ref 1.7–7.7)
Neutrophils Relative %: 59 %
Platelet Count: 198 10*3/uL (ref 150–400)
RBC: 5.5 MIL/uL — ABNORMAL HIGH (ref 3.87–5.11)
RDW: 14.9 % (ref 11.5–15.5)
WBC Count: 9.6 10*3/uL (ref 4.0–10.5)
nRBC: 0 % (ref 0.0–0.2)

## 2021-10-02 MED ORDER — SODIUM CHLORIDE 0.9% FLUSH
10.0000 mL | Freq: Once | INTRAVENOUS | Status: AC
  Administered 2021-10-02: 10 mL

## 2021-10-02 MED ORDER — TRASTUZUMAB-DKST CHEMO 150 MG IV SOLR
750.0000 mg | Freq: Once | INTRAVENOUS | Status: AC
  Administered 2021-10-02: 750 mg via INTRAVENOUS
  Filled 2021-10-02: qty 35.72

## 2021-10-02 MED ORDER — TRASTUZUMAB-DKST CHEMO 150 MG IV SOLR: 6.0000 mg/kg | Freq: Once | INTRAVENOUS | Status: DC

## 2021-10-02 MED ORDER — HEPARIN SOD (PORK) LOCK FLUSH 100 UNIT/ML IV SOLN
500.0000 [IU] | Freq: Once | INTRAVENOUS | Status: AC | PRN
  Administered 2021-10-02: 500 [IU]

## 2021-10-02 MED ORDER — DIPHENHYDRAMINE HCL 25 MG PO CAPS
25.0000 mg | ORAL_CAPSULE | Freq: Once | ORAL | Status: AC
  Administered 2021-10-02: 25 mg via ORAL
  Filled 2021-10-02: qty 1

## 2021-10-02 MED ORDER — ACETAMINOPHEN 325 MG PO TABS
650.0000 mg | ORAL_TABLET | Freq: Once | ORAL | Status: AC
  Administered 2021-10-02: 650 mg via ORAL
  Filled 2021-10-02: qty 2

## 2021-10-02 MED ORDER — SODIUM CHLORIDE 0.9% FLUSH
10.0000 mL | INTRAVENOUS | Status: DC | PRN
  Administered 2021-10-02: 10 mL

## 2021-10-02 MED ORDER — SODIUM CHLORIDE 0.9 % IV SOLN: Freq: Once | INTRAVENOUS | Status: AC

## 2021-10-02 NOTE — Progress Notes (Signed)
1st dose of Trastuzumab should be 8 mg/kg, clarified with Mendel Ryder, NP   Larene Beach, PharmD

## 2021-10-02 NOTE — Assessment & Plan Note (Addendum)
08/19/2021: Bilateral mastectomies Right mastectomy: Foci of intermediate to high-grade DCIS, sclerosing adenosis and complex sclerosing lesion, margins negative, 0/4 lymph nodes negative, ER 100%, PR 70% Left mastectomy: 2.5 cm grade 3 IDC with DCIS high-grade, margins negative, 0/4 lymph nodes negative ER 90%, PR 70%, HER2 positive, Ki-67 40%  Pathology and radiology counseling: Discussed with the patient, the details of pathology including the type of breast cancer,the clinical staging, the significance of ER, PR and HER-2/neu receptors and the implications for treatment. After reviewing the pathology in detail, we proceeded to discuss the different treatment options between surgery, radiation, chemotherapy, antiestrogen therapies.  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  She will proceed with adjuvant Herceptin today.  We reviewed this in detail.  I reviewed her echocardiogram which is stable.  She has no questions and understands the treatment plan.  She will return in 3 weeks for labs and Herceptin and in 6 weeks for labs, follow-up with Dr. Payton Mccallum, and Herceptin.  We talked about her activity level and the way the Herceptin works and she understands this and is in agreement with this plan.

## 2021-10-02 NOTE — Progress Notes (Signed)
[facility] Follow up:    [name], MD [address] [place] [address]   DIAGNOSIS:  Cancer Staging  Ductal carcinoma in situ (DCIS) of right breast Staging form: Breast, AJCC 8th Edition - Clinical stage from 07/10/2021: Stage 0 (cTis (DCIS), cN0, cM0, ER+, PR+, HER2: Not Assessed) - Unsigned Stage prefix: Initial diagnosis Nuclear grade: G1 Stage used in treatment planning: Yes National guidelines used in treatment planning: Yes Type of national guideline used in treatment planning: NCCN  Malignant neoplasm of upper-outer quadrant of left breast in female, estrogen receptor positive ([place]) Staging form: Breast, AJCC 8th Edition - Clinical stage from 07/10/2021: Stage IA (cT1c, cN0, cM0, G3, ER+, PR+, HER2+) - Signed by [name], MD on 07/10/2021 Stage prefix: Initial diagnosis Histologic grading system: 3 grade system   SUMMARY OF ONCOLOGIC HISTORY: Oncology History  Ductal carcinoma in situ (DCIS) of right breast  06/28/2021 Initial Diagnosis   Palpable mass in the left breast. Diagnostic mammogram and [place]: suspicious 1.7 cm mass in the left breast at the 1 o'clock position and 2 subtle areas of distortion in the upper outer right breast.  Left breast biopsy: Grade 3 IDC high grade focal DCIS Her2+/ER+(90%)/PR+(70%) in the left breast, and DCIS involving sclerosing adenosis with calcifications ER+(100%)/PR+(70%) in the right breast.    07/08/2021 Initial Diagnosis   Ductal carcinoma in situ (DCIS) of right breast   08/19/2021 Surgery   Right mastectomy: Foci of intermediate to high-grade DCIS, sclerosing adenosis and complex sclerosing lesion, margins negative, 0/4 lymph nodes negative, ER 100%, PR 70% Left mastectomy: 2.5 cm grade 3 IDC with DCIS high-grade, margins negative, 0/4 lymph nodes negative ER 90%, PR 70%, HER2 positive, Ki-67 40%   Malignant neoplasm of upper-outer quadrant of left breast in female, estrogen receptor positive ([facility])   06/28/2021 Initial Diagnosis   Palpable mass in the left breast. Diagnostic mammogram and [place]: suspicious 1.7 cm mass in the left breast at the 1 o'clock position and 2 subtle areas of distortion in the upper outer right breast.  Left breast biopsy: Grade 3 IDC high grade focal DCIS Her2+/ER+(90%)/PR+(70%) in the left breast, and DCIS involving sclerosing adenosis with calcifications ER+(100%)/PR+(70%) in the right breast.    07/10/2021 Cancer Staging   Staging form: Breast, AJCC 8th Edition - Clinical stage from 07/10/2021: Stage IA (cT1c, cN0, cM0, G3, ER+, PR+, HER2+) - Signed by [name], MD on 07/10/2021 Stage prefix: Initial diagnosis Histologic grading system: 3 grade system     Genetic Testing   [facility] CustomNext (47 gene) Panel was negative. No pathogenic variants were identified. The report date is 07/23/2021.  The CustomNext-Cancer+RNAinsight panel offered by [facility] includes sequencing and rearrangement analysis for the following 47 genes:  APC, ATM, AXIN2, BARD1, BMPR1A, BRCA1, BRCA2, BRIP1, CDH1, CDK4, CDKN2A, CHEK2, DICER1, EPCAM, GREM1, HOXB13, MEN1, MLH1, MSH2, MSH3, MSH6, MUTYH, NBN, NF1, NF2, NTHL1, PALB2, PMS2, POLD1, POLE, PTEN, RAD51C, RAD51D, RECQL, RET, SDHA, SDHAF2, SDHB, SDHC, SDHD, SMAD4, SMARCA4, STK11, TP53, TSC1, TSC2, and VHL.  RNA data is routinely analyzed for use in variant interpretation for all genes.   Bilateral breast cancer ([facility])  08/19/2021 Initial Diagnosis   Bilateral breast cancer ([facility])   10/02/2021 -  Chemotherapy   Patient is on Treatment Plan : BREAST Trastuzumab q21d x 13 cycles       CURRENT THERAPY: Herceptin every 3 weeks  INTERVAL HISTORY: Zailah Zagami 65 y.o. female returns for evaluation of her bilateral breast  cancer. She is recovering well from her bilateral mastectomies.  She is here today to proceed with Herceptin alone.  Her most recent echocardiogram was completed on July 24, 2021 and showed an EF of 60 to 65% and normal  global longitudinal strain.  Shandiin continues to see physical therapy following her surgery and is recovering well.  She does have some mild fatigue   Patient Active Problem List   Diagnosis Date Noted   Port-A-Cath in place 10/02/2021   Bilateral breast cancer ([facility]) 08/19/2021   Genetic testing 07/26/2021   Family history of breast cancer 07/10/2021   Family history of bone cancer 07/10/2021   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   Malignant neoplasm of upper-outer quadrant of left breast in female, estrogen receptor positive ([facility]) 07/08/2021   Back pain 04/04/2016   Weakness    Confusion    Orthostatic dizziness    Left-sided weakness 11/10/2015   Polycythemia 11/10/2015   Chronic diastolic CHF (congestive heart failure) ([facility]) 11/10/2015   Hypercarbia 11/10/2015   Respiratory acidosis 11/10/2015   Paresthesias in right hand 07/11/2015   Migraine without aura 01/30/2014   Occlusion of extracranial carotid artery 01/30/2014   Transient cerebral ischemia 01/30/2014   Headache(784.0) 01/04/2013   Dizziness 01/04/2013   TIA (transient ischemic attack) 01/04/2013   Migraine 01/04/2013   COPD (chronic obstructive pulmonary disease) ([facility]) 01/04/2013    has No Known Allergies.  MEDICAL HISTORY: Past Medical History:  Diagnosis Date   Anxiety    Arthritis    Cancer ([facility] [place])    COPD (chronic obstructive pulmonary disease) (H[facility])    Depression    GERD (gastroesophageal reflux disease)    Headache(784.0)    Hyperlipidemia    Hypertension    Incontinent of urine    Shortness of breath    Stroke [facility])     SURGICAL HISTORY: Past Surgical History:  Procedure Laterality Date   CAROTID STENT     Has known BICA occlusions since 2011; s/p left vertebral artery stent 12/26/09 & right VA stent 02/12/10   CHOLECYSTECTOMY     PORTACATH PLACEMENT Right 08/19/2021   Procedure: INSERTION PORT-A-CATH WITH ULTRASOUND;  Surgeon: [name], MD;  Location: [facility];   Service: General;  Laterality: Right;   ROTATOR CUFF REPAIR     SENTINEL NODE BIOPSY Bilateral 08/19/2021   Procedure: BILATERAL SENTINEL NODE BIOPSY;  Surgeon: [name], MD;  Location: [facility];  Service: General;  Laterality: Bilateral;   TOTAL MASTECTOMY Bilateral 08/19/2021   Procedure: BILATERAL TOTAL MASTECTOMY;  Surgeon: [name], MD;  Location: [facility];  Service: General;  Laterality: Bilateral;   TUBAL LIGATION      SOCIAL HISTORY: Social History   Socioeconomic History   Marital status: Divorced    Spouse name: Not on file   Number of children: 1   Years of education: 12th   Highest education level: Not on file  Occupational History   Occupation: disabled  Tobacco Use   Smoking status: Some Days    Packs/day: 0.50    Years: 40.00    Pack years: 20.00    Types: Cigarettes   Smokeless tobacco: Never  Vaping Use   Vaping Use: Never used  Substance and Sexual Activity   Alcohol use: No   Drug use: Yes    Types: Marijuana   Sexual activity: Never  Other Topics Concern   Not on file  Social History Narrative   Patient lives at home alone.  Patient drink diet coke  (4 daily avg).   Social Determinants of Health   Financial Resource Strain: Not on file  Food Insecurity: Not on file  Transportation Needs: Not on file  Physical Activity: Not on file  Stress: Not on file  Social Connections: Not on file  Intimate Partner Violence: Not on file    FAMILY HISTORY: Family History  Problem Relation Age of Onset   Migraines Mother    Heart attack Mother    Bone cancer Maternal Aunt    Bone cancer Maternal Aunt    Breast cancer Cousin    Breast cancer Cousin     Review of Systems  Constitutional:  Positive for fatigue. Negative for appetite change, chills, fever and unexpected weight change.  HENT:   Negative for hearing loss, lump/mass and trouble swallowing.   Eyes:  Negative for eye problems and icterus.  Respiratory:  Negative for chest tightness,  cough and shortness of breath.   Cardiovascular:  Negative for chest pain, leg swelling and palpitations.  Gastrointestinal:  Positive for constipation (She takes fiber for this which relieves her constipation). Negative for abdominal distention, abdominal pain, diarrhea, nausea and vomiting.  Endocrine: Negative for hot flashes.  Genitourinary:  Negative for difficulty urinating.   Musculoskeletal:  Negative for arthralgias.  Skin:  Negative for itching and rash.  Neurological:  Negative for dizziness, extremity weakness, headaches and numbness.  Hematological:  Negative for adenopathy. Does not bruise/bleed easily.  Psychiatric/Behavioral:  Negative for depression. The patient is not nervous/anxious.      PHYSICAL EXAMINATION  ECOG PERFORMANCE STATUS: 1 - Symptomatic but completely ambulatory  Vitals:   10/02/21 1021  BP: (!) 163/78  Pulse: 89  Resp: 18  Temp: (!) 97.2 F (36.2 C)  SpO2: 94%    Physical Exam Constitutional:      General: She is not in acute distress.    Appearance: Normal appearance. She is not toxic-appearing.  HENT:     Head: Normocephalic and atraumatic.  Eyes:     General: No scleral icterus. Cardiovascular:     Rate and Rhythm: Normal rate and regular rhythm.     Pulses: Normal pulses.     Heart sounds: Normal heart sounds.  Pulmonary:     Effort: Pulmonary effort is normal.     Breath sounds: Normal breath sounds.  Chest:     Comments: Status post bilateral mastectomies healing well no sign of local recurrence no sign of infection no sign of swelling. Abdominal:     General: Abdomen is flat. Bowel sounds are normal. There is no distension.     Palpations: Abdomen is soft.     Tenderness: There is no abdominal tenderness.  Musculoskeletal:        General: No swelling.     Cervical back: Neck supple.  Lymphadenopathy:     Cervical: No cervical adenopathy.  Skin:    General: Skin is warm and dry.     Findings: No rash.  Neurological:      General: No focal deficit present.     Mental Status: She is alert.  Psychiatric:        Mood and Affect: Mood normal.        Behavior: Behavior normal.    LABORATORY DATA:  CBC    Component Value Date/Time   WBC 9.6 10/02/2021 1008   WBC 9.0 08/15/2021 1200   RBC 5.50 (H) 10/02/2021 1008   HGB 15.3 (H) 10/02/2021 1008   HCT 47.4 (H)  10/02/2021 1008   HCT 41.2 11/12/2015 0559   PLT 198 10/02/2021 1008   MCV 86.2 10/02/2021 1008   MCH 27.8 10/02/2021 1008   MCHC 32.3 10/02/2021 1008   RDW 14.9 10/02/2021 1008   LYMPHSABS 3.0 10/02/2021 1008   MONOABS 0.6 10/02/2021 1008   EOSABS 0.2 10/02/2021 1008   BASOSABS 0.0 10/02/2021 1008    CMP     Component Value Date/Time   NA 138 10/02/2021 1008   K 4.1 10/02/2021 1008   CL 102 10/02/2021 1008   CO2 25 10/02/2021 1008   GLUCOSE 113 (H) 10/02/2021 1008   BUN 11 10/02/2021 1008   CREATININE 0.94 10/02/2021 1008   CALCIUM 8.7 (L) 10/02/2021 1008   PROT 6.9 10/02/2021 1008   ALBUMIN 3.5 10/02/2021 1008   AST 11 (L) 10/02/2021 1008   ALT 11 10/02/2021 1008   ALKPHOS 35 (L) 10/02/2021 1008   BILITOT 0.3 10/02/2021 1008   GFRNONAA >60 10/02/2021 1008   GFRAA >60 11/12/2015 0559      ASSESSMENT and THERAPY PLAN:   Malignant neoplasm of upper-outer quadrant of left breast in female, estrogen receptor positive ([facility]) 08/19/2021: Bilateral mastectomies Right mastectomy: Foci of intermediate to high-grade DCIS, sclerosing adenosis and complex sclerosing lesion, margins negative, 0/4 lymph nodes negative, ER 100%, PR 70% Left mastectomy: 2.5 cm grade 3 IDC with DCIS high-grade, margins negative, 0/4 lymph nodes negative ER 90%, PR 70%, HER2 positive, Ki-67 40%   Pathology and radiology counseling: Discussed with the patient, the details of pathology including the type of breast cancer,the clinical staging, the significance of ER, PR and HER-2/neu receptors and the implications for treatment. After reviewing the pathology in  detail, we proceeded to discuss the different treatment options between surgery, radiation, chemotherapy, antiestrogen therapies.   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  She will proceed with adjuvant Herceptin today.  We reviewed this in detail.  I reviewed her echocardiogram which is stable.  She has no questions and understands the treatment plan.  She will return in 3 weeks for labs and Herceptin and in 6 weeks for labs, follow-up with Dr. Payton Mccallum, and Herceptin.  We talked about her activity level and the way the Herceptin works and she understands this and is in agreement with this plan.      All questions were answered. The patient knows to call the clinic with any problems, questions or concerns. We can certainly see the patient much sooner if necessary.  Total encounter time: 20 minutes in face-to-face visit time, chart review, lab review, care coordination, and documentation of the encounter.  Wilber Bihari, NP 10/02/21 11:00 AM Medical Oncology and Hematology [facility] [place], [place] [address] Tel. [phone]    Fax. [phone]  *Total Encounter Time as defined by the Centers for Medicare and Medicaid Services includes, in addition to the face-to-face time of a patient visit (documented in the note above) non-face-to-face time: obtaining and reviewing outside history, ordering and reviewing medications, tests or procedures, care coordination (communications with other health care professionals or caregivers) and documentation in the medical record.

## 2021-10-02 NOTE — Patient Instructions (Signed)
[facility] ONCOLOGY  Discharge Instructions: Thank you for choosing [facility] to provide your oncology and hematology care.   If you have a lab appointment with the [facility], please go directly to the [facility] and check in at the registration area.   Wear comfortable clothing and clothing appropriate for easy access to any Portacath or PICC line.   We strive to give you quality time with your provider. You may need to reschedule your appointment if you arrive late (15 or more minutes).  Arriving late affects you and other patients whose appointments are after yours.  Also, if you miss three or more appointments without notifying the office, you may be dismissed from the clinic at the provider's discretion.      For prescription refill requests, have your pharmacy contact our office and allow 72 hours for refills to be completed.    Today you received the following chemotherapy and/or immunotherapy agents: Trastuzumab-dkst ([place])   To help prevent nausea and vomiting after your treatment, we encourage you to take your nausea medication as directed.  BELOW ARE SYMPTOMS THAT SHOULD BE REPORTED IMMEDIATELY: *FEVER GREATER THAN 100.4 F (38 C) OR HIGHER *CHILLS OR SWEATING *NAUSEA AND VOMITING THAT IS NOT CONTROLLED WITH YOUR NAUSEA MEDICATION *UNUSUAL SHORTNESS OF BREATH *UNUSUAL BRUISING OR BLEEDING *URINARY PROBLEMS (pain or burning when urinating, or frequent urination) *BOWEL PROBLEMS (unusual diarrhea, constipation, pain near the anus) TENDERNESS IN MOUTH AND THROAT WITH OR WITHOUT PRESENCE OF ULCERS (sore throat, sores in mouth, or a toothache) UNUSUAL RASH, SWELLING OR PAIN  UNUSUAL VAGINAL DISCHARGE OR ITCHING   Items with * indicate a potential emergency and should be followed up as soon as possible or go to the Emergency Department if any problems should occur.  Please show the CHEMOTHERAPY ALERT CARD or IMMUNOTHERAPY ALERT CARD at  check-in to the Emergency Department and triage nurse.  Should you have questions after your visit or need to cancel or reschedule your appointment, please contact [facility]  Dept: [phone]  and follow the prompts.  Office hours are 8:00 a.m. to 4:30 p.m. Monday - Friday. Please note that voicemails left after 4:00 p.m. may not be returned until the following business day.  We are closed weekends and major holidays. You have access to a nurse at all times for urgent questions. Please call the main number to the clinic Dept: [phone] and follow the prompts.   For any non-urgent questions, you may also contact your provider using MyChart. We now offer e-Visits for anyone 73 and older to request care online for non-urgent symptoms. For details visit [email or web address].   Also download the MyChart app! Go to the app store, search "MyChart", open the app, select [facility], and log in with your MyChart username and password.  Due to Covid, a mask is required upon entering the hospital/clinic. If you do not have a mask, one will be given to you upon arrival. For doctor visits, patients may have 1 support person aged 42 or older with them. For treatment visits, patients cannot have anyone with them due to current Covid guidelines and our immunocompromised population.   Trastuzumab injection for infusion What is this medication? TRASTUZUMAB (tras TOO zoo mab) is a monoclonal antibody. It is used to treat breast cancer and stomach cancer. This medicine may be used for other purposes; ask your health care provider or pharmacist if you have questions. COMMON BRAND NAME(S): Herceptin, Herzuma,  Ronnie Doss What should I tell my care team before I take this medication? They need to know if you have any of these conditions: heart disease heart failure lung or breathing disease, like asthma an unusual or allergic reaction to trastuzumab,  benzyl alcohol, or other medications, foods, dyes, or preservatives pregnant or trying to get pregnant breast-feeding How should I use this medication? This drug is given as an infusion into a vein. It is administered in a hospital or clinic by a specially trained health care professional. Talk to your pediatrician regarding the use of this medicine in children. This medicine is not approved for use in children. Overdosage: If you think you have taken too much of this medicine contact a poison control center or emergency room at once. NOTE: This medicine is only for you. Do not share this medicine with others. What if I miss a dose? It is important not to miss a dose. Call your doctor or health care professional if you are unable to keep an appointment. What may interact with this medication? This medicine may interact with the following medications: certain types of chemotherapy, such as daunorubicin, doxorubicin, epirubicin, and idarubicin This list may not describe all possible interactions. Give your health care provider a list of all the medicines, herbs, non-prescription drugs, or dietary supplements you use. Also tell them if you smoke, drink alcohol, or use illegal drugs. Some items may interact with your medicine. What should I watch for while using this medication? Visit your doctor for checks on your progress. Report any side effects. Continue your course of treatment even though you feel ill unless your doctor tells you to stop. Call your doctor or health care professional for advice if you get a fever, chills or sore throat, or other symptoms of a cold or flu. Do not treat yourself. Try to avoid being around people who are sick. You may experience fever, chills and shaking during your first infusion. These effects are usually mild and can be treated with other medicines. Report any side effects during the infusion to your health care professional. Fever and chills usually do not happen  with later infusions. Do not become pregnant while taking this medicine or for 7 months after stopping it. Women should inform their doctor if they wish to become pregnant or think they might be pregnant. Women of child-bearing potential will need to have a negative pregnancy test before starting this medicine. There is a potential for serious side effects to an unborn child. Talk to your health care professional or pharmacist for more information. Do not breast-feed an infant while taking this medicine or for 7 months after stopping it. Women must use effective birth control with this medicine. What side effects may I notice from receiving this medication? Side effects that you should report to your doctor or health care professional as soon as possible: allergic reactions like skin rash, itching or hives, swelling of the face, lips, or tongue chest pain or palpitations cough dizziness feeling faint or lightheaded, falls fever general ill feeling or flu-like symptoms signs of worsening heart failure like breathing problems; swelling in your legs and feet unusually weak or tired Side effects that usually do not require medical attention (report to your doctor or health care professional if they continue or are bothersome): bone pain changes in taste diarrhea joint pain nausea/vomiting weight loss This list may not describe all possible side effects. Call your doctor for medical advice about side effects. You may  report side effects to FDA at 1-800-FDA-1088. Where should I keep my medication? This drug is given in a hospital or clinic and will not be stored at home. NOTE: This sheet is a summary. It may not cover all possible information. If you have questions about this medicine, talk to your doctor, pharmacist, or health care provider.  2022 Elsevier/Gold Standard (2016-10-28 00:00:00)

## 2021-10-02 NOTE — Progress Notes (Signed)
Pt is approved for the $1000 [employer] grant.  

## 2021-10-11 ENCOUNTER — Ambulatory Visit: Payer: PPO

## 2021-10-14 ENCOUNTER — Other Ambulatory Visit: Payer: Self-pay

## 2021-10-14 ENCOUNTER — Ambulatory Visit: Payer: PPO

## 2021-10-14 DIAGNOSIS — Z170 Estrogen receptor positive status [ER+]: Secondary | ICD-10-CM

## 2021-10-14 DIAGNOSIS — M25611 Stiffness of right shoulder, not elsewhere classified: Secondary | ICD-10-CM

## 2021-10-14 DIAGNOSIS — D0511 Intraductal carcinoma in situ of right breast: Secondary | ICD-10-CM

## 2021-10-14 DIAGNOSIS — M25612 Stiffness of left shoulder, not elsewhere classified: Secondary | ICD-10-CM

## 2021-10-14 DIAGNOSIS — R600 Localized edema: Secondary | ICD-10-CM

## 2021-10-14 DIAGNOSIS — R293 Abnormal posture: Secondary | ICD-10-CM

## 2021-10-14 NOTE — Therapy (Signed)
[facility] @ [address] [place], [place], [address] Phone: [phone]   Fax:  [phone]  Physical Therapy Treatment  Patient Details  Name: April [name] MRN: [id number] Date of Birth: 10/18/1956 Referring Provider (PT): Dr. Autumn Messing   Encounter Date: 10/14/2021   PT End of Session - 10/14/21 1502     Visit Number 3    Number of Visits 10    Date for PT Re-Evaluation 10/24/21    PT Start Time 1406    PT Stop Time 1500    PT Time Calculation (min) 54 min    Activity Tolerance Patient tolerated treatment well    Behavior During Therapy [facility] for tasks assessed/performed             Past Medical History:  Diagnosis Date   Anxiety    Arthritis    Cancer ([place])    COPD (chronic obstructive pulmonary disease) ([place])    Depression    GERD (gastroesophageal reflux disease)    Headache(784.0)    Hyperlipidemia    Hypertension    Incontinent of urine    Shortness of breath    Stroke [facility])     Past Surgical History:  Procedure Laterality Date   CAROTID STENT     Has known BICA occlusions since 2011; s/p left vertebral artery stent 12/26/09 & right VA stent 02/12/10   CHOLECYSTECTOMY     PORTACATH PLACEMENT Right 08/19/2021   Procedure: INSERTION PORT-A-CATH WITH ULTRASOUND;  Surgeon: [name], MD;  Location: [facility];  Service: General;  Laterality: Right;   ROTATOR CUFF REPAIR     SENTINEL NODE BIOPSY Bilateral 08/19/2021   Procedure: BILATERAL SENTINEL NODE BIOPSY;  Surgeon: [name], MD;  Location: [facility];  Service: General;  Laterality: Bilateral;   TOTAL MASTECTOMY Bilateral 08/19/2021   Procedure: BILATERAL TOTAL MASTECTOMY;  Surgeon: [name], MD;  Location: [facility];  Service: General;  Laterality: Bilateral;   TUBAL LIGATION      There were no vitals filed for this visit.   Subjective Assessment - 10/14/21 1409     Subjective I saw Dr. Marlou Starks last week and he drainaed about 60 cc's of  fluid from my Rt chest. I have a small area at the Lt but he didn't think it was enough to get anything out of. I was sore from that last week but am feeling better.    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 The left breast mass measures 1.7 cm and is located in the upper outer quadrant. It is triple positive with a Ki67 of 40%. The right breast DCIS is ER/PR positive. 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 She smokes 1 pack/day; bilateral mastectomies on 08/19/21 R 0/4 nodes L 0/3 nodes, TIA, CHF, polycythemia    Patient Stated Goals to do what you want me to do and get out of here    Currently in Pain? No/denies                               [facility] Adult PT Treatment/Exercise - 10/14/21 0001       Manual Therapy   Manual Therapy Myofascial release;Manual Lymphatic Drainage (MLD);Passive ROM    Myofascial Release To bil axillae and chest walls where tightness palpable  Manual Lymphatic Drainage (MLD) In Supine: Short neck, 5 diaphragmatic breaths, Rt inguinal and Rt axillo-inguinal anastomosis, then same on Lt side beginning to instruct pt in this.    Passive ROM In supine to bil shoulders into flexion, abd and D2 to tolerance with cuing throughout to relax due to muscle guarding                          PT Long Term Goals - 09/26/21 1622       PT LONG TERM GOAL #1   Title Patient will demonstrate she has regained full shoulder ROM and function post operatively compared to baselines.    Time 8    Period Weeks    Status On-going      PT LONG TERM GOAL #2   Title Pt will not demonstrate any rippling edema across bilateral chest to decrease risk of infection.    Time 4    Period Weeks    Status New      PT LONG TERM GOAL #3   Title Pt will be independent in self MLD for long term management of  edema    Time 4    Period Weeks    Status New      PT LONG TERM GOAL #4   Title Pt will be independent in a home exercise program for continued stretching and strengthening    Time 4    Period Weeks    Status New      PT LONG TERM GOAL #5   Title Pt will report 75% improvement in tightness and discomfort across medial chest to allow improved comfort.    Time 4    Period Weeks    Status New                   Plan - 10/14/21 1503     Clinical Impression Statement Continued with manual therapy working to decrease myofascial tightness across bil chest and decrease edema at Rt>Lt lateral chest. Began instructing her in self MLD while performing at [place] anastomosis having her return demo. She was able to return good demo but will benefit from further review of this.    Stability/Clinical Decision Making Stable/Uncomplicated    Rehab Potential Good    PT Frequency 2x / week    PT Duration 4 weeks    PT Treatment/Interventions ADLs/Self Care Home Management;Therapeutic exercise;Patient/family education;Manual techniques;Manual lymph drainage;Compression bandaging;Passive range of motion    PT Next Visit Plan Cont MLD across bilateral chest and review with pt, give bilateral shoulder ROM exercises and eventually supine scap    PT Home Exercise Plan Post op HEP, self MLD    Consulted and Agree with Plan of Care Patient             Patient will benefit from skilled therapeutic intervention in order to improve the following deficits and impairments:  Postural dysfunction, Decreased range of motion, Decreased knowledge of precautions, Pain, Increased edema  Visit Diagnosis: Stiffness of right shoulder, not elsewhere classified  Stiffness of left shoulder, not elsewhere classified  Abnormal posture  Localized edema  Ductal carcinoma in situ (DCIS) of right breast  Malignant neoplasm of upper-outer quadrant of left breast in female, estrogen receptor positive  ([place])     Problem List Patient Active Problem List   Diagnosis Date Noted   Port-A-Cath in place 10/02/2021   Bilateral breast cancer ([place]) 08/19/2021   Genetic testing 07/26/2021  Family history of breast cancer 07/10/2021   Family history of bone cancer 07/10/2021   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   Malignant neoplasm of upper-outer quadrant of left breast in female, estrogen receptor positive ([place]) 07/08/2021   Back pain 04/04/2016   Weakness    Confusion    Orthostatic dizziness    Left-sided weakness 11/10/2015   Polycythemia 11/10/2015   Chronic diastolic CHF (congestive heart failure) ([place]) 11/10/2015   Hypercarbia 11/10/2015   Respiratory acidosis 11/10/2015   Paresthesias in right hand 07/11/2015   Migraine without aura 01/30/2014   Occlusion of extracranial carotid artery 01/30/2014   Transient cerebral ischemia 01/30/2014   Headache(784.0) 01/04/2013   Dizziness 01/04/2013   TIA (transient ischemic attack) 01/04/2013   Migraine 01/04/2013   COPD (chronic obstructive pulmonary disease) ([place]) 01/04/2013    April [name], PTA 10/14/2021, 3:06 PM  [facility] @ [address] [place], [place], [address] Phone: [phone]   Fax:  [phone]  Name: April [name] MRN: [id number] Date of Birth: 1956/08/04

## 2021-10-16 ENCOUNTER — Encounter: Payer: PPO | Admitting: Rehabilitation

## 2021-10-23 ENCOUNTER — Ambulatory Visit: Payer: PPO | Admitting: Hematology and Oncology

## 2021-10-23 ENCOUNTER — Inpatient Hospital Stay: Payer: PPO

## 2021-10-23 ENCOUNTER — Other Ambulatory Visit: Payer: Self-pay

## 2021-10-23 ENCOUNTER — Other Ambulatory Visit: Payer: PPO

## 2021-10-23 VITALS — BP 135/69 | HR 85 | Temp 98.3°F | Resp 18 | Wt 211.8 lb

## 2021-10-23 DIAGNOSIS — C50911 Malignant neoplasm of unspecified site of right female breast: Secondary | ICD-10-CM

## 2021-10-23 DIAGNOSIS — Z5112 Encounter for antineoplastic immunotherapy: Secondary | ICD-10-CM | POA: Diagnosis not present

## 2021-10-23 DIAGNOSIS — Z95828 Presence of other vascular implants and grafts: Secondary | ICD-10-CM

## 2021-10-23 LAB — CBC WITH DIFFERENTIAL (CANCER CENTER ONLY)
Abs Immature Granulocytes: 0.04 10*3/uL (ref 0.00–0.07)
Basophils Absolute: 0 10*3/uL (ref 0.0–0.1)
Basophils Relative: 0 %
Eosinophils Absolute: 0.2 10*3/uL (ref 0.0–0.5)
Eosinophils Relative: 2 %
HCT: 43.8 % (ref 36.0–46.0)
Hemoglobin: 14.5 g/dL (ref 12.0–15.0)
Immature Granulocytes: 0 %
Lymphocytes Relative: 30 %
Lymphs Abs: 2.9 10*3/uL (ref 0.7–4.0)
MCH: 28.3 pg (ref 26.0–34.0)
MCHC: 33.1 g/dL (ref 30.0–36.0)
MCV: 85.5 fL (ref 80.0–100.0)
Monocytes Absolute: 0.6 10*3/uL (ref 0.1–1.0)
Monocytes Relative: 7 %
Neutro Abs: 6 10*3/uL (ref 1.7–7.7)
Neutrophils Relative %: 61 %
Platelet Count: 202 10*3/uL (ref 150–400)
RBC: 5.12 MIL/uL — ABNORMAL HIGH (ref 3.87–5.11)
RDW: 15.3 % (ref 11.5–15.5)
WBC Count: 9.8 10*3/uL (ref 4.0–10.5)
nRBC: 0 % (ref 0.0–0.2)

## 2021-10-23 LAB — CMP (CANCER CENTER ONLY)
ALT: 13 U/L (ref 0–44)
AST: 11 U/L — ABNORMAL LOW (ref 15–41)
Albumin: 3.6 g/dL (ref 3.5–5.0)
Alkaline Phosphatase: 28 U/L — ABNORMAL LOW (ref 38–126)
Anion gap: 6 (ref 5–15)
BUN: 18 mg/dL (ref 8–23)
CO2: 28 mmol/L (ref 22–32)
Calcium: 8.9 mg/dL (ref 8.9–10.3)
Chloride: 104 mmol/L (ref 98–111)
Creatinine: 0.98 mg/dL (ref 0.44–1.00)
GFR, Estimated: >60 mL/min (ref >60)
Glucose, Bld: 109 mg/dL — ABNORMAL HIGH (ref 70–99)
Potassium: 3.9 mmol/L (ref 3.5–5.1)
Sodium: 138 mmol/L (ref 135–145)
Total Bilirubin: 0.5 mg/dL (ref 0.3–1.2)
Total Protein: 7 g/dL (ref 6.5–8.1)

## 2021-10-23 MED ORDER — ACETAMINOPHEN 325 MG PO TABS
650.0000 mg | ORAL_TABLET | Freq: Once | ORAL | Status: AC
  Administered 2021-10-23: 16:00:00 650 mg via ORAL
  Filled 2021-10-23: qty 2

## 2021-10-23 MED ORDER — TRASTUZUMAB-DKST CHEMO 150 MG IV SOLR
6.0000 mg/kg | Freq: Once | INTRAVENOUS | Status: AC
  Administered 2021-10-23: 16:00:00 567 mg via INTRAVENOUS
  Filled 2021-10-23: qty 27

## 2021-10-23 MED ORDER — DIPHENHYDRAMINE HCL 25 MG PO CAPS
25.0000 mg | ORAL_CAPSULE | Freq: Once | ORAL | Status: AC
  Administered 2021-10-23: 16:00:00 25 mg via ORAL
  Filled 2021-10-23: qty 1

## 2021-10-23 MED ORDER — SODIUM CHLORIDE 0.9 % IV SOLN: Freq: Once | INTRAVENOUS | Status: AC

## 2021-10-23 MED ORDER — HEPARIN SOD (PORK) LOCK FLUSH 100 UNIT/ML IV SOLN
500.0000 [IU] | Freq: Once | INTRAVENOUS | Status: AC | PRN
  Administered 2021-10-23: 17:00:00 500 [IU]

## 2021-10-23 MED ORDER — SODIUM CHLORIDE 0.9% FLUSH
10.0000 mL | INTRAVENOUS | Status: DC | PRN
  Administered 2021-10-23: 17:00:00 10 mL

## 2021-10-23 MED ORDER — SODIUM CHLORIDE 0.9% FLUSH
10.0000 mL | Freq: Once | INTRAVENOUS | Status: AC
  Administered 2021-10-23: 14:00:00 10 mL

## 2021-10-23 NOTE — Patient Instructions (Signed)
[facility] MEDICAL ONCOLOGY  Discharge Instructions: ?Thank you for choosing [facility] to provide your oncology and hematology care.  ? ?If you have a lab appointment with the Cancer Center, please go directly to the Cancer Center and check in at the registration area. ?  ?Wear comfortable clothing and clothing appropriate for easy access to any Portacath or PICC line.  ? ?We strive to give you quality time with your provider. You may need to reschedule your appointment if you arrive late (15 or more minutes).  Arriving late affects you and other patients whose appointments are after yours.  Also, if you miss three or more appointments without notifying the office, you may be dismissed from the clinic at the provider?s discretion.    ?  ?For prescription refill requests, have your pharmacy contact our office and allow 72 hours for refills to be completed.   ? ?Today you received the following chemotherapy and/or immunotherapy agents: Trastuzumab    ?  ?To help prevent nausea and vomiting after your treatment, we encourage you to take your nausea medication as directed. ? ?BELOW ARE SYMPTOMS THAT SHOULD BE REPORTED IMMEDIATELY: ?*FEVER GREATER THAN 100.4 F (38 ?C) OR HIGHER ?*CHILLS OR SWEATING ?*NAUSEA AND VOMITING THAT IS NOT CONTROLLED WITH YOUR NAUSEA MEDICATION ?*UNUSUAL SHORTNESS OF BREATH ?*UNUSUAL BRUISING OR BLEEDING ?*URINARY PROBLEMS (pain or burning when urinating, or frequent urination) ?*BOWEL PROBLEMS (unusual diarrhea, constipation, pain near the anus) ?TENDERNESS IN MOUTH AND THROAT WITH OR WITHOUT PRESENCE OF ULCERS (sore throat, sores in mouth, or a toothache) ?UNUSUAL RASH, SWELLING OR PAIN  ?UNUSUAL VAGINAL DISCHARGE OR ITCHING  ? ?Items with * indicate a potential emergency and should be followed up as soon as possible or go to the Emergency Department if any problems should occur. ? ?Please show the CHEMOTHERAPY ALERT CARD or IMMUNOTHERAPY ALERT CARD at check-in  to the Emergency Department and triage nurse. ? ?Should you have questions after your visit or need to cancel or reschedule your appointment, please contact [facility]COL[facility]  Dept: [phone]  and follow the prompts.  Office hours are 8:00 a.m. to 4:30 p.m. Monday - Friday. Please note that voicemails left after 4:00 p.m. may not be returned until the following business day.  We are closed weekends and major holidays. You have access to a nurse at all times for urgent questions. Please call the main number to the clinic Dept: [phone] and follow the prompts. ? ? ?For any non-urgent questions, you may also contact your provider using MyChart. We now offer e-Visits for anyone 18 and older to request care online for non-urgent symptoms. For details visit [email or web address]. ?  ?Also download the MyChart app! Go to the app store, search "MyChart", open the app, select [facility], and log in with your MyChart username and password. ? ?Due to Covid, a mask is required upon entering the hospital/clinic. If you do not have a mask, one will be given to you upon arrival. For doctor visits, patients may have 1 support person aged 18 or older with them. For treatment visits, patients cannot have anyone with them due to current Covid guidelines and our immunocompromised population.  ? ?

## 2021-10-24 ENCOUNTER — Telehealth: Payer: Self-pay | Admitting: *Deleted

## 2021-10-24 NOTE — Telephone Encounter (Signed)
This RN spoke with pt per her call inquiring about obtaining a refill on pain medication -hydrocodone, last filled 09/23/2021 with quantity of 20 tablets.  This RN informed pt MD is out of the office until tomorrow for request but also need to inquire further regarding discomfort for best therapy.  Lakshmi states pain is " like all around my chest area- feeling like I have a lot of fluid under my arms and my breast area is tender to touch " ( pt had bilateral mastectomies ).  She is presently under PT for issues.  Per medication list reviewed noted robaxin - which per pt she is not currently using " I even forgot I had that in my cabinet"  Per discussion pt will use the above presently and this RN will follow up with Dr Lindi Adie tomorrow for further recommendation.

## 2021-10-29 ENCOUNTER — Ambulatory Visit: Payer: PPO | Attending: General Surgery | Admitting: Physical Therapy

## 2021-10-29 ENCOUNTER — Other Ambulatory Visit: Payer: Self-pay

## 2021-10-29 DIAGNOSIS — R293 Abnormal posture: Secondary | ICD-10-CM

## 2021-10-29 DIAGNOSIS — M25611 Stiffness of right shoulder, not elsewhere classified: Secondary | ICD-10-CM | POA: Diagnosis present

## 2021-10-29 DIAGNOSIS — C50412 Malignant neoplasm of upper-outer quadrant of left female breast: Secondary | ICD-10-CM | POA: Diagnosis present

## 2021-10-29 DIAGNOSIS — M25612 Stiffness of left shoulder, not elsewhere classified: Secondary | ICD-10-CM

## 2021-10-29 DIAGNOSIS — D0511 Intraductal carcinoma in situ of right breast: Secondary | ICD-10-CM | POA: Diagnosis present

## 2021-10-29 DIAGNOSIS — R600 Localized edema: Secondary | ICD-10-CM | POA: Diagnosis present

## 2021-10-29 DIAGNOSIS — Z170 Estrogen receptor positive status [ER+]: Secondary | ICD-10-CM | POA: Diagnosis present

## 2021-10-29 NOTE — Therapy (Signed)
[facility] @ [address] [place], [place], [address] Phone: [phone]   Fax:  [phone]  Physical Therapy Treatment  Patient Details  Name: April [name] MRN: [id number] Date of Birth: 1955/12/20 Referring Provider (PT): Dr. Autumn Messing   Encounter Date: 10/29/2021   PT End of Session - 10/29/21 1457     Visit Number 4    Number of Visits 10    Date for PT Re-Evaluation 11/26/21    PT Start Time 7408    PT Stop Time 1448    PT Time Calculation (min) 56 min    Activity Tolerance Patient tolerated treatment well    Behavior During Therapy [facility] for tasks assessed/performed             Past Medical History:  Diagnosis Date   Anxiety    Arthritis    Cancer ([place])    COPD (chronic obstructive pulmonary disease) ([place])    Depression    GERD (gastroesophageal reflux disease)    Headache(784.0)    Hyperlipidemia    Hypertension    Incontinent of urine    Shortness of breath    Stroke [facility])     Past Surgical History:  Procedure Laterality Date   CAROTID STENT     Has known BICA occlusions since 2011; s/p left vertebral artery stent 12/26/09 & right VA stent 02/12/10   CHOLECYSTECTOMY     PORTACATH PLACEMENT Right 08/19/2021   Procedure: INSERTION PORT-A-CATH WITH ULTRASOUND;  Surgeon: [name], MD;  Location: [facility];  Service: General;  Laterality: Right;   ROTATOR CUFF REPAIR     SENTINEL NODE BIOPSY Bilateral 08/19/2021   Procedure: BILATERAL SENTINEL NODE BIOPSY;  Surgeon: [name], MD;  Location: [facility];  Service: General;  Laterality: Bilateral;   TOTAL MASTECTOMY Bilateral 08/19/2021   Procedure: BILATERAL TOTAL MASTECTOMY;  Surgeon: [name], MD;  Location: [facility];  Service: General;  Laterality: Bilateral;   TUBAL LIGATION      There were no vitals filed for this visit.   Subjective Assessment - 10/29/21 1356     Subjective I feel like my chest is just as tight as it can be and it is  sore. I feel like the fluid has come back. I think the swelling in the L side has gotten worse.    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 The left breast mass measures 1.7 cm and is located in the upper outer quadrant. It is triple positive with a Ki67 of 40%. The right breast DCIS is ER/PR positive. 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 She smokes 1 pack/day; bilateral mastectomies on 08/19/21 R 0/4 nodes L 0/3 nodes, TIA, CHF, polycythemia    Patient Stated Goals to do what you want me to do and get out of here    Currently in Pain? Yes    Pain Score 7     Pain Location Chest    Pain Orientation Mid;Left    Pain Descriptors / Indicators Sore    Pain Type Acute pain    Pain Onset More than a month ago    Pain Frequency Intermittent    Aggravating Factors  touching it    Pain Relieving Factors nothing    Effect of Pain on Daily Activities restricts breathing  [facility] Adult PT Treatment/Exercise - 10/29/21 0001       Manual Therapy   Manual Therapy Manual Lymphatic Drainage (MLD);Passive ROM;Soft tissue mobilization    Soft tissue mobilization using cocoa butter to bilateral pecs where pt has increased tightness (L worse than R)    Manual Lymphatic Drainage (MLD) In Supine: Short neck, 5 diaphragmatic breaths, Rt inguinal and Rt axillo-inguinal anastomosis, then same on Lt side beginning to instruct pt in this.    Passive ROM to bilateral shoulders in to flexion, abduction and ER with left shoulder more limited than R                          PT Long Term Goals - 10/29/21 1358       PT LONG TERM GOAL #1   Title Patient will demonstrate she has regained full shoulder ROM and function post operatively compared to baselines.    Time 8    Period Weeks    Status On-going       PT LONG TERM GOAL #2   Title Pt will not demonstrate any rippling edema across bilateral chest to decrease risk of infection.    Time 4    Period Weeks    Status On-going      PT LONG TERM GOAL #3   Title Pt will be independent in self MLD for long term management of edema    Time 4    Period Weeks    Status On-going      PT LONG TERM GOAL #4   Title Pt will be independent in a home exercise program for continued stretching and strengthening    Time 4    Period Weeks    Status On-going      PT LONG TERM GOAL #5   Title Pt will report 75% improvement in tightness and discomfort across medial chest to allow improved comfort.    Baseline 10/29/21- no improvement (pt has only been to 1 treatment session)    Time 4    Period Weeks    Status On-going                   Plan - 10/29/21 1458     Clinical Impression Statement Updated pt's POC this visit. Assessed pt's progress towards goals in therapy. She has not made much progress towards goals because she has only been seen once since her evaluation. She had fluid drained from the R side following her evaluation and today it appears the fluid has returned. The left side has fullness in her lateral trunk and posterior trunk and the R side has rippling edema across her chest. Pt plans on contacting her doctor regarding this to see if it needs to be drained again. She has increased discomfort across her chest and tightness in bilateral pecs which limits her L shoulder ROM. Pt would benefit from continued skilled PT services to improve L shoulder ROM, decrease pec tightness and swelling.    PT Frequency 2x / week    PT Duration 4 weeks    PT Treatment/Interventions ADLs/Self Care Home Management;Therapeutic exercise;Patient/family education;Manual techniques;Manual lymph drainage;Compression bandaging;Passive range of motion;Scar mobilization    PT Next Visit Plan Cont MLD across bilateral chest and review with pt, give bilateral  shoulder ROM exercises and eventually supine scap    PT Home Exercise Plan Post op HEP, self MLD    Consulted and Agree with Plan of Care Patient  Patient will benefit from skilled therapeutic intervention in order to improve the following deficits and impairments:  Postural dysfunction, Decreased range of motion, Decreased knowledge of precautions, Pain, Increased edema  Visit Diagnosis: Stiffness of right shoulder, not elsewhere classified  Stiffness of left shoulder, not elsewhere classified  Abnormal posture  Localized edema  Ductal carcinoma in situ (DCIS) of right breast  Malignant neoplasm of upper-outer quadrant of left breast in female, estrogen receptor positive ([place])     Problem List Patient Active Problem List   Diagnosis Date Noted   Port-A-Cath in place 10/02/2021   Bilateral breast cancer ([place]) 08/19/2021   Genetic testing 07/26/2021   Family history of breast cancer 07/10/2021   Family history of bone cancer 07/10/2021   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   Malignant neoplasm of upper-outer quadrant of left breast in female, estrogen receptor positive ([place]) 07/08/2021   Back pain 04/04/2016   Weakness    Confusion    Orthostatic dizziness    Left-sided weakness 11/10/2015   Polycythemia 11/10/2015   Chronic diastolic CHF (congestive heart failure) ([place]) 11/10/2015   Hypercarbia 11/10/2015   Respiratory acidosis 11/10/2015   Paresthesias in right hand 07/11/2015   Migraine without aura 01/30/2014   Occlusion of extracranial carotid artery 01/30/2014   Transient cerebral ischemia 01/30/2014   Headache(784.0) 01/04/2013   Dizziness 01/04/2013   TIA (transient ischemic attack) 01/04/2013   Migraine 01/04/2013   COPD (chronic obstructive pulmonary disease) ([place]) 01/04/2013    Allyson Sabal [place], PT 10/29/2021, 3:02 PM  [facility] @ [address] [place], [place], [address] Phone: [phone]   Fax:  [phone]  Name: April [name] MRN: [id number] Date of Birth: February 07, 1956   Manus Gunning, PT 10/29/21 3:02 PM

## 2021-10-31 ENCOUNTER — Encounter: Payer: PPO | Admitting: Physical Therapy

## 2021-11-04 ENCOUNTER — Other Ambulatory Visit: Payer: Self-pay

## 2021-11-04 ENCOUNTER — Ambulatory Visit: Payer: PPO | Admitting: Physical Therapy

## 2021-11-04 ENCOUNTER — Encounter: Payer: Self-pay | Admitting: Physical Therapy

## 2021-11-04 DIAGNOSIS — M25612 Stiffness of left shoulder, not elsewhere classified: Secondary | ICD-10-CM

## 2021-11-04 DIAGNOSIS — C50412 Malignant neoplasm of upper-outer quadrant of left female breast: Secondary | ICD-10-CM

## 2021-11-04 DIAGNOSIS — M25611 Stiffness of right shoulder, not elsewhere classified: Secondary | ICD-10-CM | POA: Diagnosis not present

## 2021-11-04 DIAGNOSIS — R293 Abnormal posture: Secondary | ICD-10-CM

## 2021-11-04 DIAGNOSIS — D0511 Intraductal carcinoma in situ of right breast: Secondary | ICD-10-CM

## 2021-11-04 DIAGNOSIS — Z170 Estrogen receptor positive status [ER+]: Secondary | ICD-10-CM

## 2021-11-04 DIAGNOSIS — R600 Localized edema: Secondary | ICD-10-CM

## 2021-11-04 NOTE — Therapy (Signed)
[facility] @ [place], [place], [address] Phone: [phone]   Fax:  [phone]  Physical Therapy Treatment  Patient Details  Name: April [name] MRN: [id number] Date of Birth: 10/26/1956 Referring Provider (PT): Dr. Autumn Messing   Encounter Date: 11/04/2021   PT End of Session - 11/04/21 1402     Visit Number 5    Number of Visits 10    Date for PT Re-Evaluation 11/26/21    PT Start Time 9563    PT Stop Time 1400    PT Time Calculation (min) 23 min    Activity Tolerance Patient tolerated treatment well    Behavior During Therapy [facility] for tasks assessed/performed             Past Medical History:  Diagnosis Date   Anxiety    Arthritis    Cancer ([place])    COPD (chronic obstructive pulmonary disease) ([place])    Depression    GERD (gastroesophageal reflux disease)    Headache(784.0)    Hyperlipidemia    Hypertension    Incontinent of urine    Shortness of breath    Stroke [facility])     Past Surgical History:  Procedure Laterality Date   CAROTID STENT     Has known BICA occlusions since 2011; s/p left vertebral artery stent 12/26/09 & right VA stent 02/12/10   CHOLECYSTECTOMY     PORTACATH PLACEMENT Right 08/19/2021   Procedure: INSERTION PORT-A-CATH WITH ULTRASOUND;  Surgeon: [name], MD;  Location: [facility];  Service: General;  Laterality: Right;   ROTATOR CUFF REPAIR     SENTINEL NODE BIOPSY Bilateral 08/19/2021   Procedure: BILATERAL SENTINEL NODE BIOPSY;  Surgeon: [name], MD;  Location: [facility];  Service: General;  Laterality: Bilateral;   TOTAL MASTECTOMY Bilateral 08/19/2021   Procedure: BILATERAL TOTAL MASTECTOMY;  Surgeon: [name], MD;  Location: [facility];  Service: General;  Laterality: Bilateral;   TUBAL LIGATION      There were no vitals filed for this visit.   Subjective Assessment - 11/04/21 1338     Subjective they drew 80 ccs off the right side of my chest and I have been  wearing a bra to keep it from coming back.    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 The left breast mass measures 1.7 cm and is located in the upper outer quadrant. It is triple positive with a Ki67 of 40%. The right breast DCIS is ER/PR positive. 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 She smokes 1 pack/day; bilateral mastectomies on 08/19/21 R 0/4 nodes L 0/3 nodes, TIA, CHF, polycythemia    Patient Stated Goals to do what you want me to do and get out of here    Currently in Pain? Yes    Pain Score 7     Pain Location Chest    Pain Orientation Left;Mid    Pain Descriptors / Indicators Sore    Pain Type Acute pain    Pain Onset More than a month ago    Pain Frequency Intermittent    Aggravating Factors  touching it    Pain Relieving Factors nothing    Effect of Pain on Daily Activities restricts breathing                [facility] PT Assessment -  11/04/21 0001       Observation/Other Assessments   Observations fullness in R posterior upper quadrant and lateral trunk, redness along lateral mastectomy scar similar to a yeast infection - educated pt to follow up with doctor regarding this                           [facility] Adult PT Treatment/Exercise - 11/04/21 0001       Manual Therapy   Manual Lymphatic Drainage (MLD) In Supine: Short neck, 5 diaphragmatic breaths, Rt inguinal and Rt axillo-inguinal anastomosis then to L s/L to focus on posterior upper quadrant and lateral trunk where pt's swelling is worse then back to supine retracing all steps                          PT Long Term Goals - 10/29/21 1358       PT LONG TERM GOAL #1   Title Patient will demonstrate she has regained full shoulder ROM and function post operatively compared to baselines.    Time 8    Period Weeks    Status On-going       PT LONG TERM GOAL #2   Title Pt will not demonstrate any rippling edema across bilateral chest to decrease risk of infection.    Time 4    Period Weeks    Status On-going      PT LONG TERM GOAL #3   Title Pt will be independent in self MLD for long term management of edema    Time 4    Period Weeks    Status On-going      PT LONG TERM GOAL #4   Title Pt will be independent in a home exercise program for continued stretching and strengthening    Time 4    Period Weeks    Status On-going      PT LONG TERM GOAL #5   Title Pt will report 75% improvement in tightness and discomfort across medial chest to allow improved comfort.    Baseline 10/29/21- no improvement (pt has only been to 1 treatment session)    Time 4    Period Weeks    Status On-going                   Plan - 11/04/21 1509     Clinical Impression Statement Pt arrived on the wrong day today so she had a shortned session because therapist worked her in. She reports she had 80cc of fluid drained from the R chest. She still has considerable edema in the R lateral trunk and posterior trunk. There is no longer rippling edema across her chest which is an improvement from last session. She has developed some redness at lateral mastectomy scar under a small lobule that looks similar to a yeast infection. Pt plans on following up with her doctor regarding this. Spent session focused on MLD to R lateral trunk and posterior upper quadrant to improve comfort.    PT Frequency 2x / week    PT Duration 4 weeks    PT Treatment/Interventions ADLs/Self Care Home Management;Therapeutic exercise;Patient/family education;Manual techniques;Manual lymph drainage;Compression bandaging;Passive range of motion;Scar mobilization    PT Next Visit Plan Cont MLD across bilateral chest and review with pt, give bilateral shoulder ROM exercises and eventually supine scap    PT Home Exercise Plan Post op HEP, self MLD    Consulted and  Agree  with Plan of Care Patient             Patient will benefit from skilled therapeutic intervention in order to improve the following deficits and impairments:  Postural dysfunction, Decreased range of motion, Decreased knowledge of precautions, Pain, Increased edema  Visit Diagnosis: Stiffness of right shoulder, not elsewhere classified  Stiffness of left shoulder, not elsewhere classified  Abnormal posture  Localized edema  Ductal carcinoma in situ (DCIS) of right breast  Malignant neoplasm of upper-outer quadrant of left breast in female, estrogen receptor positive ([place])     Problem List Patient Active Problem List   Diagnosis Date Noted   Port-A-Cath in place 10/02/2021   Bilateral breast cancer ([place]) 08/19/2021   Genetic testing 07/26/2021   Family history of breast cancer 07/10/2021   Family history of bone cancer 07/10/2021   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   Malignant neoplasm of upper-outer quadrant of left breast in female, estrogen receptor positive ([place]) 07/08/2021   Back pain 04/04/2016   Weakness    Confusion    Orthostatic dizziness    Left-sided weakness 11/10/2015   Polycythemia 11/10/2015   Chronic diastolic CHF (congestive heart failure) ([place]) 11/10/2015   Hypercarbia 11/10/2015   Respiratory acidosis 11/10/2015   Paresthesias in right hand 07/11/2015   Migraine without aura 01/30/2014   Occlusion of extracranial carotid artery 01/30/2014   Transient cerebral ischemia 01/30/2014   Headache(784.0) 01/04/2013   Dizziness 01/04/2013   TIA (transient ischemic attack) 01/04/2013   Migraine 01/04/2013   COPD (chronic obstructive pulmonary disease) ([place]) 01/04/2013    Allyson Sabal [place], PT 11/04/2021, 3:11 PM  [facility] @ [place], [place], [address] Phone: [phone]   Fax:  [phone]  Name: April [name] MRN: [id number] Date of Birth:  [birth year]

## 2021-11-05 ENCOUNTER — Encounter: Payer: PPO | Admitting: Physical Therapy

## 2021-11-05 ENCOUNTER — Encounter: Payer: Self-pay | Admitting: Hematology and Oncology

## 2021-11-06 ENCOUNTER — Encounter: Payer: Self-pay | Admitting: Hematology and Oncology

## 2021-11-07 ENCOUNTER — Other Ambulatory Visit: Payer: Self-pay

## 2021-11-07 ENCOUNTER — Ambulatory Visit: Payer: PPO | Admitting: Physical Therapy

## 2021-11-07 ENCOUNTER — Encounter: Payer: Self-pay | Admitting: Physical Therapy

## 2021-11-07 DIAGNOSIS — M25611 Stiffness of right shoulder, not elsewhere classified: Secondary | ICD-10-CM | POA: Diagnosis not present

## 2021-11-07 DIAGNOSIS — Z170 Estrogen receptor positive status [ER+]: Secondary | ICD-10-CM

## 2021-11-07 DIAGNOSIS — M25612 Stiffness of left shoulder, not elsewhere classified: Secondary | ICD-10-CM

## 2021-11-07 DIAGNOSIS — R600 Localized edema: Secondary | ICD-10-CM

## 2021-11-07 DIAGNOSIS — R293 Abnormal posture: Secondary | ICD-10-CM

## 2021-11-07 DIAGNOSIS — C50412 Malignant neoplasm of upper-outer quadrant of left female breast: Secondary | ICD-10-CM

## 2021-11-07 DIAGNOSIS — D0511 Intraductal carcinoma in situ of right breast: Secondary | ICD-10-CM

## 2021-11-07 NOTE — Therapy (Signed)
[facility] @ [place], [place], [address] Phone: [phone]   Fax:  [phone]  Physical Therapy Treatment  Patient Details  Name: April [name] MRN: [id number] Date of Birth: [birth year] Referring Provider (PT): Dr. Autumn Messing   Encounter Date: 11/07/2021   PT End of Session - 11/07/21 1455     Visit Number 6    Number of Visits 10    Date for PT Re-Evaluation 11/26/21    PT Start Time 2080    PT Stop Time 2233    PT Time Calculation (min) 51 min    Activity Tolerance Patient tolerated treatment well    Behavior During Therapy [facility] for tasks assessed/performed             Past Medical History:  Diagnosis Date   Anxiety    Arthritis    Cancer ([place])    COPD (chronic obstructive pulmonary disease) ([place])    Depression    GERD (gastroesophageal reflux disease)    Headache(784.0)    Hyperlipidemia    Hypertension    Incontinent of urine    Shortness of breath    Stroke [facility])     Past Surgical History:  Procedure Laterality Date   CAROTID STENT     Has known BICA occlusions since 2011; s/p left vertebral artery stent 12/26/09 & right VA stent 02/12/10   CHOLECYSTECTOMY     PORTACATH PLACEMENT Right 08/19/2021   Procedure: INSERTION PORT-A-CATH WITH ULTRASOUND;  Surgeon: [name], MD;  Location: [facility];  Service: General;  Laterality: Right;   ROTATOR CUFF REPAIR     SENTINEL NODE BIOPSY Bilateral 08/19/2021   Procedure: BILATERAL SENTINEL NODE BIOPSY;  Surgeon: [name], MD;  Location: [facility];  Service: General;  Laterality: Bilateral;   TOTAL MASTECTOMY Bilateral 08/19/2021   Procedure: BILATERAL TOTAL MASTECTOMY;  Surgeon: [name], MD;  Location: [place] [facility];  Service: General;  Laterality: Bilateral;   TUBAL LIGATION      There were no vitals filed for this visit.   Subjective Assessment - 11/07/21 1415     Subjective Pt reports she is tired today after traveling to [place] for a  funeral.    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 The left breast mass measures 1.7 cm and is located in the upper outer quadrant. It is triple positive with a Ki67 of 40%. The right breast DCIS is ER/PR positive. 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 She smokes 1 pack/day; bilateral mastectomies on 08/19/21 R 0/4 nodes L 0/3 nodes, TIA, CHF, polycythemia    Patient Stated Goals to do what you want me to do and get out of here    Currently in Pain? Yes    Pain Score 7     Pain Location Chest    Pain Orientation Left;Mid    Pain Descriptors / Indicators Sore    Pain Type Acute pain    Pain Onset More than a month ago    Pain Frequency Intermittent    Aggravating Factors  touching    Pain Relieving Factors nothing    Effect of Pain on Daily Activities restricts breathing some  [facility] Adult PT Treatment/Exercise - 11/07/21 0001       Manual Therapy   Soft tissue mobilization using cocoa butter to bilateral pecs where pt has increased tightness (L worse than R)    Myofascial Release to L chest in area of cording where pt has increased tightness    Manual Lymphatic Drainage (MLD) In Supine: Short neck, 5 diaphragmatic breaths, Rt inguinal and Rt axillo-inguinal anastomosis continuing to work on lateral trunk where pt's swelling is worse then retracing all steps    Passive ROM to bilateral shoulders and abduction with tightness across bilateral pecs at end range                          PT Long Term Goals - 10/29/21 1358       PT LONG TERM GOAL #1   Title Patient will demonstrate she has regained full shoulder ROM and function post operatively compared to baselines.    Time 8    Period Weeks    Status On-going      PT LONG TERM GOAL #2   Title Pt will not demonstrate  any rippling edema across bilateral chest to decrease risk of infection.    Time 4    Period Weeks    Status On-going      PT LONG TERM GOAL #3   Title Pt will be independent in self MLD for long term management of edema    Time 4    Period Weeks    Status On-going      PT LONG TERM GOAL #4   Title Pt will be independent in a home exercise program for continued stretching and strengthening    Time 4    Period Weeks    Status On-going      PT LONG TERM GOAL #5   Title Pt will report 75% improvement in tightness and discomfort across medial chest to allow improved comfort.    Baseline 10/29/21- no improvement (pt has only been to 1 treatment session)    Time 4    Period Weeks    Status On-going                   Plan - 11/07/21 1459     Clinical Impression Statement Pt returns to PT. She traveled yesterday for a funeral and is more fatigued today. Pt is still having tightness across her chest. Her edema has worsened especially in the right side and is rippling again. She sees the surgeon tomorrow regarding this. She has cording in her left chest so spent time today doing myofascial release to this area. Also continued with MLD to R lateral trunk especially axillary region where pt has increased fluid. Began AAROM today including pulleys and ball.    PT Frequency 2x / week    PT Duration 4 weeks    PT Treatment/Interventions ADLs/Self Care Home Management;Therapeutic exercise;Patient/family education;Manual techniques;Manual lymph drainage;Compression bandaging;Passive range of motion;Scar mobilization    PT Next Visit Plan Cont MLD across bilateral chest and review with pt, give bilateral shoulder ROM exercises and eventually supine scap    PT Home Exercise Plan Post op HEP, self MLD    Consulted and Agree with Plan of Care Patient             Patient will benefit from skilled therapeutic intervention in order to improve the following deficits and impairments:  Postural  dysfunction, Decreased range of motion, Decreased knowledge  of precautions, Pain, Increased edema  Visit Diagnosis: Stiffness of right shoulder, not elsewhere classified  Stiffness of left shoulder, not elsewhere classified  Localized edema  Abnormal posture  Ductal carcinoma in situ (DCIS) of right breast  Malignant neoplasm of upper-outer quadrant of left breast in female, estrogen receptor positive ([place])     Problem List Patient Active Problem List   Diagnosis Date Noted   Port-A-Cath in place 10/02/2021   Bilateral breast cancer ([place]) 08/19/2021   Genetic testing 07/26/2021   Family history of breast cancer 07/10/2021   Family history of bone cancer 07/10/2021   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   Malignant neoplasm of upper-outer quadrant of left breast in female, estrogen receptor positive ([place]) 07/08/2021   Back pain 04/04/2016   Weakness    Confusion    Orthostatic dizziness    Left-sided weakness 11/10/2015   Polycythemia 11/10/2015   Chronic diastolic CHF (congestive heart failure) ([place]) 11/10/2015   Hypercarbia 11/10/2015   Respiratory acidosis 11/10/2015   Paresthesias in right hand 07/11/2015   Migraine without aura 01/30/2014   Occlusion of extracranial carotid artery 01/30/2014   Transient cerebral ischemia 01/30/2014   Headache(784.0) 01/04/2013   Dizziness 01/04/2013   TIA (transient ischemic attack) 01/04/2013   Migraine 01/04/2013   COPD (chronic obstructive pulmonary disease) ([place]) 01/04/2013    Allyson Sabal [place], PT 11/07/2021, 3:04 PM  [facility] @ [place], [place], [address] Phone: [phone]   Fax:  [phone]  Name: Shaynah Hund MRN: [id number] Date of Birth: 09/12/1956

## 2021-11-11 ENCOUNTER — Encounter: Payer: Self-pay | Admitting: Physical Therapy

## 2021-11-11 ENCOUNTER — Other Ambulatory Visit: Payer: Self-pay | Admitting: *Deleted

## 2021-11-11 ENCOUNTER — Ambulatory Visit: Payer: PPO | Admitting: Physical Therapy

## 2021-11-11 ENCOUNTER — Other Ambulatory Visit: Payer: Self-pay

## 2021-11-11 DIAGNOSIS — R600 Localized edema: Secondary | ICD-10-CM

## 2021-11-11 DIAGNOSIS — M25611 Stiffness of right shoulder, not elsewhere classified: Secondary | ICD-10-CM | POA: Diagnosis not present

## 2021-11-11 DIAGNOSIS — R293 Abnormal posture: Secondary | ICD-10-CM

## 2021-11-11 DIAGNOSIS — Z170 Estrogen receptor positive status [ER+]: Secondary | ICD-10-CM

## 2021-11-11 DIAGNOSIS — D0511 Intraductal carcinoma in situ of right breast: Secondary | ICD-10-CM

## 2021-11-11 DIAGNOSIS — M25612 Stiffness of left shoulder, not elsewhere classified: Secondary | ICD-10-CM

## 2021-11-11 DIAGNOSIS — C50412 Malignant neoplasm of upper-outer quadrant of left female breast: Secondary | ICD-10-CM

## 2021-11-11 NOTE — Progress Notes (Signed)
Patient Care Team: [name], MD as PCP - General (Family Medicine) [name], RN as Oncology Nurse Navigator [name], RN as Oncology Nurse Navigator [name], MD as Consulting Physician (General Surgery) [name], MD as Consulting Physician (Hematology and Oncology) [name], MD as Consulting Physician (Radiation Oncology)  DIAGNOSIS:    ICD-10-CM   1. Malignant neoplasm of upper-outer quadrant of left breast in female, estrogen receptor positive ([place])  C50.412    Z17.0       SUMMARY OF ONCOLOGIC HISTORY: Oncology History  Ductal carcinoma in situ (DCIS) of right breast  06/28/2021 Initial Diagnosis   Palpable mass in the left breast. Diagnostic mammogram and [place]: suspicious 1.7 cm mass in the left breast at the 1 o'clock position and 2 subtle areas of distortion in the upper outer right breast.  Left breast biopsy: Grade 3 IDC high grade focal DCIS Her2+/ER+(90%)/PR+(70%) in the left breast, and DCIS involving sclerosing adenosis with calcifications ER+(100%)/PR+(70%) in the right breast.    07/08/2021 Initial Diagnosis   Ductal carcinoma in situ (DCIS) of right breast   08/19/2021 Surgery   Right mastectomy: Foci of intermediate to high-grade DCIS, sclerosing adenosis and complex sclerosing lesion, margins negative, 0/4 lymph nodes negative, ER 100%, PR 70% Left mastectomy: 2.5 cm grade 3 IDC with DCIS high-grade, margins negative, 0/4 lymph nodes negative ER 90%, PR 70%, HER2 positive, Ki-67 40%   Malignant neoplasm of upper-outer quadrant of left breast in female, estrogen receptor positive ([place])  06/28/2021 Initial Diagnosis   Palpable mass in the left breast. Diagnostic mammogram and [place]: suspicious 1.7 cm mass in the left breast at the 1 o'clock position and 2 subtle areas of distortion in the upper outer right breast.  Left breast biopsy: Grade 3 IDC high grade focal DCIS Her2+/ER+(90%)/PR+(70%) in the left breast, and DCIS involving sclerosing  adenosis with calcifications ER+(100%)/PR+(70%) in the right breast.    07/10/2021 Cancer Staging   Staging form: Breast, AJCC 8th Edition - Clinical stage from 07/10/2021: Stage IA (cT1c, cN0, cM0, G3, ER+, PR+, HER2+) - Signed by [name], MD on 07/10/2021 Stage prefix: Initial diagnosis Histologic grading system: 3 grade system     Genetic Testing   [facility] CustomNext (47 gene) Panel was negative. No pathogenic variants were identified. The report date is 07/23/2021.  The CustomNext-Cancer+RNAinsight panel offered by [facility] includes sequencing and rearrangement analysis for the following 47 genes:  APC, ATM, AXIN2, BARD1, BMPR1A, BRCA1, BRCA2, BRIP1, CDH1, CDK4, CDKN2A, CHEK2, DICER1, EPCAM, GREM1, HOXB13, MEN1, MLH1, MSH2, MSH3, MSH6, MUTYH, NBN, NF1, NF2, NTHL1, PALB2, PMS2, POLD1, POLE, PTEN, RAD51C, RAD51D, RECQL, RET, SDHA, SDHAF2, SDHB, SDHC, SDHD, SMAD4, SMARCA4, STK11, TP53, TSC1, TSC2, and VHL.  RNA data is routinely analyzed for use in variant interpretation for all genes.   Bilateral breast cancer ([place])  08/19/2021 Initial Diagnosis   Bilateral breast cancer ([place])   10/02/2021 -  Chemotherapy   Patient is on Treatment Plan : BREAST Trastuzumab q21d x 13 cycles       CHIEF COMPLIANT: Follow-up of breast cancer  INTERVAL HISTORY: April [name] is a 66 y.o. with above-mentioned history of bilateral breast cancer having undergone bilateral mastectomies. She presents to the clinic today for follow-up.  She received first cycle of trastuzumab and after that she felt like achiness in her joints.  Did not have any diarrhea.  ALLERGIES:  has No Known Allergies.  MEDICATIONS:  Current Outpatient Medications  Medication Sig Dispense Refill  letrozole (FEMARA) 2.5 MG tablet Take 1 tablet (2.5 mg total) by mouth daily. 90 tablet 3   acetaminophen (TYLENOL) 500 MG tablet Take 1,000 mg by mouth every 8 (eight) hours as needed for moderate pain.     albuterol (VENTOLIN HFA) 108  (90 Base) MCG/ACT inhaler Inhale 2 puffs into the lungs every 6 (six) hours as needed for wheezing or shortness of breath.     aspirin-acetaminophen-caffeine (EXCEDRIN MIGRAINE) 250-250-65 MG per tablet Take 2 tablets by mouth every 6 (six) hours as needed for migraine.      clonazePAM (KLONOPIN) 0.5 MG tablet Take 0.5 mg by mouth in the morning, at noon, and at bedtime.  5   clopidogrel (PLAVIX) 75 MG tablet Take 75 mg by mouth daily.     fluticasone (FLONASE) 50 MCG/ACT nasal spray Place 2 sprays into both nostrils daily as needed for allergies or rhinitis.     lidocaine-prilocaine (EMLA) cream Apply to affected area once 30 g 3   methocarbamol (ROBAXIN) 500 MG tablet Take 1 tablet (500 mg total) by mouth every 6 (six) hours as needed for muscle spasms. 30 tablet 2   nicotine (NICODERM CQ - DOSED IN MG/24 HR) 7 mg/24hr patch Place 1 patch (7 mg total) onto the skin daily. (Patient not taking: No sig reported) 28 patch 0   pantoprazole (PROTONIX) 40 MG tablet Take 40 mg by mouth 2 (two) times daily.     promethazine (PHENERGAN) 25 MG tablet Take 25 mg by mouth every 8 (eight) hours as needed for nausea or vomiting.     simvastatin (ZOCOR) 40 MG tablet Take 40 mg by mouth daily.     sodium chloride (OCEAN) 0.65 % SOLN nasal spray Place 2 sprays into both nostrils 3 (three) times daily as needed for congestion.      tiotropium (SPIRIVA) 18 MCG inhalation capsule Place 18 mcg into inhaler and inhale daily.     trazodone (DESYREL) 300 MG tablet Take 300 mg by mouth at bedtime.     varenicline (CHANTIX) 0.5 MG tablet Take 0.5 mg by mouth daily.     venlafaxine XR (EFFEXOR-XR) 150 MG 24 hr capsule Take 150 mg by mouth daily with breakfast.     No current facility-administered medications for this visit.    PHYSICAL EXAMINATION: ECOG PERFORMANCE STATUS: 1 - Symptomatic but completely ambulatory  Vitals:   11/12/21 1350  BP: 137/67  Pulse: 83  Resp: 18  Temp: (!) 97.2 F (36.2 C)  SpO2: 95%    Filed Weights   11/12/21 1350  Weight: 218 lb 1.6 oz (98.9 kg)    BREAST: No palpable masses or nodules in either right or left breasts. No palpable axillary supraclavicular or infraclavicular adenopathy no breast tenderness or nipple discharge. (exam performed in the presence of a chaperone)  LABORATORY DATA:  I have reviewed the data as listed CMP Latest Ref Rng & Units 10/23/2021 10/02/2021 08/19/2021  Glucose 70 - 99 mg/dL 109(H) 113(H) -  BUN 8 - 23 mg/dL 18 11 -  Creatinine 0.44 - 1.00 mg/dL 0.98 0.94 1.07(H)  Sodium 135 - 145 mmol/L 138 138 -  Potassium 3.5 - 5.1 mmol/L 3.9 4.1 -  Chloride 98 - 111 mmol/L 104 102 -  CO2 22 - 32 mmol/L 28 25 -  Calcium 8.9 - 10.3 mg/dL 8.9 8.7(L) -  Total Protein 6.5 - 8.1 g/dL 7.0 6.9 -  Total Bilirubin 0.3 - 1.2 mg/dL 0.5 0.3 -  Alkaline Phos 38 - 126  U/L 28(L) 35(L) -  AST 15 - 41 U/L 11(L) 11(L) -  ALT 0 - 44 U/L 13 11 -    Lab Results  Component Value Date   WBC 7.5 11/12/2021   HGB 14.0 11/12/2021   HCT 43.9 11/12/2021   MCV 87.8 11/12/2021   PLT 171 11/12/2021   NEUTROABS 4.4 11/12/2021    ASSESSMENT & PLAN:  Malignant neoplasm of upper-outer quadrant of left breast in female, estrogen receptor positive ([place]) 08/19/2021: Bilateral mastectomies Right mastectomy: Foci of intermediate to high-grade DCIS, sclerosing adenosis and complex sclerosing lesion, margins negative, 0/4 lymph nodes negative, ER 100%, PR 70% Left mastectomy: 2.5 cm grade 3 IDC with DCIS high-grade, margins negative, 0/4 lymph nodes negative ER 90%, PR 70%, HER2 positive, Ki-67 40% ---------------------------------------------------------------------------------------------------------------------------------------------- Current treatment: Herceptin started 10/02/2021 (patient rejected chemotherapy) Herceptin toxicities: Patient felt achy after her last Herceptin treatment  Letrozole counseling: We discussed the risks and benefits of anti-estrogen therapy  with aromatase inhibitors. These include but not limited to insomnia, hot flashes, mood changes, vaginal dryness, bone density loss, and weight gain. We strongly believe that the benefits far outweigh the risks. Patient understands these risks and consented to starting treatment. Planned treatment duration is 7 years.  Continued chest wall pain: Patient is out of pain medications and I discussed with her about the addiction potential of longstanding pain medication.  She will apply CBD oil and see if it makes a difference.  Return to clinic every 3 weeks for Herceptin every 6 weeks for follow-up with me    No orders of the defined types were placed in this encounter.  The patient has a good understanding of the overall plan. she agrees with it. she will call with any problems that may develop before the next visit here.  Total time spent: 30 mins including face to face time and time spent for planning, charting and coordination of care  Rulon Eisenmenger, MD, MPH 11/12/2021  I, Thana Ates, am acting as scribe for Dr. [name].  I have reviewed the above documentation for accuracy and completeness, and I agree with the above.

## 2021-11-11 NOTE — Therapy (Signed)
[facility] @ [place], [place], [address] Phone: [phone]   Fax:  [phone]  Physical Therapy Treatment  Patient Details  Name: April [name] MRN: [id number] Date of Birth: Mar 25, 1956 Referring Provider (PT): Dr. Autumn Messing   Encounter Date: 11/11/2021   PT End of Session - 11/11/21 1601     Visit Number 7    Number of Visits 10    Date for PT Re-Evaluation 11/26/21    PT Start Time 2947    PT Stop Time 1548   pt wanted to end a little early due to soreness in L side   PT Time Calculation (min) 45 min    Activity Tolerance Patient tolerated treatment well    Behavior During Therapy [facility] for tasks assessed/performed             Past Medical History:  Diagnosis Date   Anxiety    Arthritis    Cancer ([place])    COPD (chronic obstructive pulmonary disease) ([place])    Depression    GERD (gastroesophageal reflux disease)    Headache(784.0)    Hyperlipidemia    Hypertension    Incontinent of urine    Shortness of breath    Stroke [facility])     Past Surgical History:  Procedure Laterality Date   CAROTID STENT     Has known BICA occlusions since 2011; s/p left vertebral artery stent 12/26/09 & right VA stent 02/12/10   CHOLECYSTECTOMY     PORTACATH PLACEMENT Right 08/19/2021   Procedure: INSERTION PORT-A-CATH WITH ULTRASOUND;  Surgeon: [name], MD;  Location: [facility];  Service: General;  Laterality: Right;   ROTATOR CUFF REPAIR     SENTINEL NODE BIOPSY Bilateral 08/19/2021   Procedure: BILATERAL SENTINEL NODE BIOPSY;  Surgeon: [name], MD;  Location: [facility];  Service: General;  Laterality: Bilateral;   TOTAL MASTECTOMY Bilateral 08/19/2021   Procedure: BILATERAL TOTAL MASTECTOMY;  Surgeon: [name], MD;  Location: [facility];  Service: General;  Laterality: Bilateral;   TUBAL LIGATION      There were no vitals filed for this visit.   Subjective Assessment - 11/11/21 1518     Subjective They  did not think I had enough fluid to drain.    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 The left breast mass measures 1.7 cm and is located in the upper outer quadrant. It is triple positive with a Ki67 of 40%. The right breast DCIS is ER/PR positive. 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 She smokes 1 pack/day; bilateral mastectomies on 08/19/21 R 0/4 nodes L 0/3 nodes, TIA, CHF, polycythemia    Patient Stated Goals to do what you want me to do and get out of here    Currently in Pain? Yes    Pain Score 7     Pain Location Chest    Pain Orientation Right;Left;Mid    Pain Descriptors / Indicators Sore    Pain Type Acute pain    Pain Onset More than a month ago    Pain Frequency Intermittent    Aggravating Factors  touching    Pain Relieving Factors nothing    Effect of Pain on Daily Activities restricts breathing at times  [facility] Adult PT Treatment/Exercise - 11/11/21 0001       Shoulder Exercises: Pulleys   Flexion 2 minutes    ABduction 2 minutes      Shoulder Exercises: Therapy Ball   Flexion Both;5 reps    ABduction Both;10 reps      Manual Therapy   Soft tissue mobilization using cocoa butter to L pec where pt has increased tightness (L worse than R)    Myofascial Release to L chest in area of cording where pt has increased tightness    Manual Lymphatic Drainage (MLD) In Supine: Short neck, 5 diaphragmatic breaths, Rt inguinal and Rt axillo-inguinal anastomosis continuing to work on lateral trunk where pt's swelling is worse then retracing all steps    Passive ROM to bilateral shoulders and abduction with tightness across bilateral pecs at end range especially on L                          PT Long Term Goals - 10/29/21 1358       PT LONG TERM GOAL #1   Title  Patient will demonstrate she has regained full shoulder ROM and function post operatively compared to baselines.    Time 8    Period Weeks    Status On-going      PT LONG TERM GOAL #2   Title Pt will not demonstrate any rippling edema across bilateral chest to decrease risk of infection.    Time 4    Period Weeks    Status On-going      PT LONG TERM GOAL #3   Title Pt will be independent in self MLD for long term management of edema    Time 4    Period Weeks    Status On-going      PT LONG TERM GOAL #4   Title Pt will be independent in a home exercise program for continued stretching and strengthening    Time 4    Period Weeks    Status On-going      PT LONG TERM GOAL #5   Title Pt will report 75% improvement in tightness and discomfort across medial chest to allow improved comfort.    Baseline 10/29/21- no improvement (pt has only been to 1 treatment session)    Time 4    Period Weeks    Status On-going                   Plan - 11/11/21 1601     Clinical Impression Statement Pt saw her sugeon and she did not need her seroma drained again. Continued with MLD to bilateral chest. She is more tight today especially on the L side with decreased L shoulder ROM. Performed soft tissue mobilization to L pec to decrease tightness and improve ROM. Pt had a strong stretch with L shoulder PROM. Continued with pulleys and ball and pt feels a good stretch with those.    PT Frequency 2x / week    PT Duration 4 weeks    PT Treatment/Interventions ADLs/Self Care Home Management;Therapeutic exercise;Patient/family education;Manual techniques;Manual lymph drainage;Compression bandaging;Passive range of motion;Scar mobilization    PT Next Visit Plan Cont MLD across bilateral chest and review with pt, give bilateral shoulder ROM exercises and eventually supine scap    PT Home Exercise Plan Post op HEP, self MLD    Consulted and Agree with Plan of Care Patient  Patient  will benefit from skilled therapeutic intervention in order to improve the following deficits and impairments:  Postural dysfunction, Decreased range of motion, Decreased knowledge of precautions, Pain, Increased edema  Visit Diagnosis: Stiffness of right shoulder, not elsewhere classified  Stiffness of left shoulder, not elsewhere classified  Localized edema  Abnormal posture  Ductal carcinoma in situ (DCIS) of right breast  Malignant neoplasm of upper-outer quadrant of left breast in female, estrogen receptor positive ([place])     Problem List Patient Active Problem List   Diagnosis Date Noted   Port-A-Cath in place 10/02/2021   Bilateral breast cancer ([place]) 08/19/2021   Genetic testing 07/26/2021   Family history of breast cancer 07/10/2021   Family history of bone cancer 07/10/2021   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   Malignant neoplasm of upper-outer quadrant of left breast in female, estrogen receptor positive ([place]) 07/08/2021   Back pain 04/04/2016   Weakness    Confusion    Orthostatic dizziness    Left-sided weakness 11/10/2015   Polycythemia 11/10/2015   Chronic diastolic CHF (congestive heart failure) ([place]) 11/10/2015   Hypercarbia 11/10/2015   Respiratory acidosis 11/10/2015   Paresthesias in right hand 07/11/2015   Migraine without aura 01/30/2014   Occlusion of extracranial carotid artery 01/30/2014   Transient cerebral ischemia 01/30/2014   Headache(784.0) 01/04/2013   Dizziness 01/04/2013   TIA (transient ischemic attack) 01/04/2013   Migraine 01/04/2013   COPD (chronic obstructive pulmonary disease) ([place]) 01/04/2013    Allyson Sabal [place], PT 11/11/2021, 4:04 PM  [facility] @ [place], [place], [address] Phone: [phone]   Fax:  [phone]  Name: April [name] MRN: [id number] Date of Birth: 1956-05-07

## 2021-11-12 ENCOUNTER — Inpatient Hospital Stay: Payer: PPO | Attending: Hematology and Oncology

## 2021-11-12 ENCOUNTER — Other Ambulatory Visit: Payer: Self-pay | Admitting: *Deleted

## 2021-11-12 ENCOUNTER — Other Ambulatory Visit: Payer: PPO

## 2021-11-12 ENCOUNTER — Encounter: Payer: Self-pay | Admitting: *Deleted

## 2021-11-12 ENCOUNTER — Inpatient Hospital Stay: Payer: PPO

## 2021-11-12 ENCOUNTER — Inpatient Hospital Stay (HOSPITAL_BASED_OUTPATIENT_CLINIC_OR_DEPARTMENT_OTHER): Payer: PPO | Admitting: Hematology and Oncology

## 2021-11-12 DIAGNOSIS — C50912 Malignant neoplasm of unspecified site of left female breast: Secondary | ICD-10-CM

## 2021-11-12 DIAGNOSIS — C50412 Malignant neoplasm of upper-outer quadrant of left female breast: Secondary | ICD-10-CM | POA: Diagnosis present

## 2021-11-12 DIAGNOSIS — C50911 Malignant neoplasm of unspecified site of right female breast: Secondary | ICD-10-CM

## 2021-11-12 DIAGNOSIS — Z5112 Encounter for antineoplastic immunotherapy: Secondary | ICD-10-CM | POA: Insufficient documentation

## 2021-11-12 DIAGNOSIS — Z170 Estrogen receptor positive status [ER+]: Secondary | ICD-10-CM | POA: Diagnosis not present

## 2021-11-12 DIAGNOSIS — Z79899 Other long term (current) drug therapy: Secondary | ICD-10-CM

## 2021-11-12 DIAGNOSIS — Z5181 Encounter for therapeutic drug level monitoring: Secondary | ICD-10-CM

## 2021-11-12 DIAGNOSIS — Z95828 Presence of other vascular implants and grafts: Secondary | ICD-10-CM

## 2021-11-12 LAB — CMP (CANCER CENTER ONLY)
ALT: 10 U/L (ref 0–44)
AST: 10 U/L — ABNORMAL LOW (ref 15–41)
Albumin: 3.6 g/dL (ref 3.5–5.0)
Alkaline Phosphatase: 31 U/L — ABNORMAL LOW (ref 38–126)
Anion gap: 4 — ABNORMAL LOW (ref 5–15)
BUN: 12 mg/dL (ref 8–23)
CO2: 32 mmol/L (ref 22–32)
Calcium: 8.9 mg/dL (ref 8.9–10.3)
Chloride: 103 mmol/L (ref 98–111)
Creatinine: 0.9 mg/dL (ref 0.44–1.00)
GFR, Estimated: >60 mL/min (ref >60)
Glucose, Bld: 96 mg/dL (ref 70–99)
Potassium: 4.1 mmol/L (ref 3.5–5.1)
Sodium: 139 mmol/L (ref 135–145)
Total Bilirubin: 0.4 mg/dL (ref 0.3–1.2)
Total Protein: 6.7 g/dL (ref 6.5–8.1)

## 2021-11-12 LAB — CBC WITH DIFFERENTIAL (CANCER CENTER ONLY)
Abs Immature Granulocytes: 0.03 10*3/uL (ref 0.00–0.07)
Basophils Absolute: 0 10*3/uL (ref 0.0–0.1)
Basophils Relative: 0 %
Eosinophils Absolute: 0.2 10*3/uL (ref 0.0–0.5)
Eosinophils Relative: 3 %
HCT: 43.9 % (ref 36.0–46.0)
Hemoglobin: 14 g/dL (ref 12.0–15.0)
Immature Granulocytes: 0 %
Lymphocytes Relative: 32 %
Lymphs Abs: 2.4 10*3/uL (ref 0.7–4.0)
MCH: 28 pg (ref 26.0–34.0)
MCHC: 31.9 g/dL (ref 30.0–36.0)
MCV: 87.8 fL (ref 80.0–100.0)
Monocytes Absolute: 0.4 10*3/uL (ref 0.1–1.0)
Monocytes Relative: 6 %
Neutro Abs: 4.4 10*3/uL (ref 1.7–7.7)
Neutrophils Relative %: 59 %
Platelet Count: 171 10*3/uL (ref 150–400)
RBC: 5 MIL/uL (ref 3.87–5.11)
RDW: 15.5 % (ref 11.5–15.5)
WBC Count: 7.5 10*3/uL (ref 4.0–10.5)
nRBC: 0 % (ref 0.0–0.2)

## 2021-11-12 MED ORDER — DIPHENHYDRAMINE HCL 25 MG PO CAPS
25.0000 mg | ORAL_CAPSULE | Freq: Once | ORAL | Status: AC
  Administered 2021-11-12: 25 mg via ORAL
  Filled 2021-11-12: qty 1

## 2021-11-12 MED ORDER — SODIUM CHLORIDE 0.9% FLUSH
10.0000 mL | Freq: Once | INTRAVENOUS | Status: AC
  Administered 2021-11-12: 10 mL

## 2021-11-12 MED ORDER — SODIUM CHLORIDE 0.9 % IV SOLN: Freq: Once | INTRAVENOUS | Status: AC

## 2021-11-12 MED ORDER — SODIUM CHLORIDE 0.9% FLUSH
10.0000 mL | INTRAVENOUS | Status: DC | PRN
  Administered 2021-11-12: 10 mL

## 2021-11-12 MED ORDER — HEPARIN SOD (PORK) LOCK FLUSH 100 UNIT/ML IV SOLN
500.0000 [IU] | Freq: Once | INTRAVENOUS | Status: AC | PRN
  Administered 2021-11-12: 500 [IU]

## 2021-11-12 MED ORDER — TRASTUZUMAB-DKST CHEMO 150 MG IV SOLR
6.0000 mg/kg | Freq: Once | INTRAVENOUS | Status: AC
  Administered 2021-11-12: 567 mg via INTRAVENOUS
  Filled 2021-11-12: qty 27

## 2021-11-12 MED ORDER — LETROZOLE 2.5 MG PO TABS: 2.5000 mg | ORAL_TABLET | Freq: Every day | ORAL | 3 refills | Status: DC

## 2021-11-12 MED ORDER — ACETAMINOPHEN 325 MG PO TABS
650.0000 mg | ORAL_TABLET | Freq: Once | ORAL | Status: AC
  Administered 2021-11-12: 650 mg via ORAL
  Filled 2021-11-12: qty 2

## 2021-11-12 NOTE — Progress Notes (Signed)
Per Dr. Lindi [name] OK to trt today w/ echo from 07/24/21 EF 60-65%

## 2021-11-12 NOTE — Assessment & Plan Note (Addendum)
08/19/2021: Bilateral mastectomies Right mastectomy: Foci of intermediate to high-grade DCIS, sclerosing adenosis and complex sclerosing lesion, margins negative, 0/4 lymph nodes negative, ER 100%, PR 70% Left mastectomy: 2.5 cm grade 3 IDC with DCIS high-grade, margins negative, 0/4 lymph nodes negative ER 90%, PR 70%, HER2 positive, Ki-67 40% ---------------------------------------------------------------------------------------------------------------------------------------------- Current treatment: Herceptin started 10/02/2021 Herceptin toxicities: None  Letrozole counseling: We discussed the risks and benefits of anti-estrogen therapy with aromatase inhibitors. These include but not limited to insomnia, hot flashes, mood changes, vaginal dryness, bone density loss, and weight gain. We strongly believe that the benefits far outweigh the risks. Patient understands these risks and consented to starting treatment. Planned treatment duration is 7 years.  Return to clinic every 3 weeks for Herceptin every 6 weeks for follow-up with me

## 2021-11-12 NOTE — Patient Instructions (Signed)
[facility]  Discharge Instructions: Thank you for choosing [facility] to provide your oncology and hematology care.   If you have a lab appointment with the Cancer Center, please go directly to the Cancer Center and check in at the registration area.   Wear comfortable clothing and clothing appropriate for easy access to any Portacath or PICC line.   We strive to give you quality time with your provider. You may need to reschedule your appointment if you arrive late (15 or more minutes).  Arriving late affects you and other patients whose appointments are after yours.  Also, if you miss three or more appointments without notifying the office, you may be dismissed from the clinic at the provider's discretion.      For prescription refill requests, have your pharmacy contact our office and allow 72 hours for refills to be completed.    Today you received the following chemotherapy and/or immunotherapy agents: Ogivri      To help prevent nausea and vomiting after your treatment, we encourage you to take your nausea medication as directed.  BELOW ARE SYMPTOMS THAT SHOULD BE REPORTED IMMEDIATELY: *FEVER GREATER THAN 100.4 F (38 C) OR HIGHER *CHILLS OR SWEATING *NAUSEA AND VOMITING THAT IS NOT CONTROLLED WITH YOUR NAUSEA MEDICATION *UNUSUAL SHORTNESS OF BREATH *UNUSUAL BRUISING OR BLEEDING *URINARY PROBLEMS (pain or burning when urinating, or frequent urination) *BOWEL PROBLEMS (unusual diarrhea, constipation, pain near the anus) TENDERNESS IN MOUTH AND THROAT WITH OR WITHOUT PRESENCE OF ULCERS (sore throat, sores in mouth, or a toothache) UNUSUAL RASH, SWELLING OR PAIN  UNUSUAL VAGINAL DISCHARGE OR ITCHING   Items with * indicate a potential emergency and should be followed up as soon as possible or go to the Emergency Department if any problems should occur.  Please show the CHEMOTHERAPY ALERT CARD or IMMUNOTHERAPY ALERT CARD at check-in to the  Emergency Department and triage nurse.  Should you have questions after your visit or need to cancel or reschedule your appointment, please contact [facility]  Dept: [phone]  and follow the prompts.  Office hours are 8:00 a.m. to 4:30 p.m. Monday - Friday. Please note that voicemails left after 4:00 p.m. may not be returned until the following business day.  We are closed weekends and major holidays. You have access to a nurse at all times for urgent questions. Please call the main number to the clinic Dept: [phone] and follow the prompts.   For any non-urgent questions, you may also contact your provider using MyChart. We now offer e-Visits for anyone 18 and older to request care online for non-urgent symptoms. For details visit [email or web address].   Also download the MyChart app! Go to the app store, search "MyChart", open the app, select [facility], and log in with your MyChart username and password.  Due to Covid, a mask is required upon entering the hospital/clinic. If you do not have a mask, one will be given to you upon arrival. For doctor visits, patients may have 1 support person aged 18 or older with them. For treatment visits, patients cannot have anyone with them due to current Covid guidelines and our immunocompromised population.   

## 2021-11-13 ENCOUNTER — Telehealth: Payer: Self-pay

## 2021-11-13 NOTE — Telephone Encounter (Signed)
-----   Message from Willis Modena, RN sent at 11/12/2021  3:23 PM EST ----- Regarding: Dr. Alvy Bimler 1st time Ogivri f/u tol well

## 2021-11-13 NOTE — Telephone Encounter (Signed)
LM for patient to call back to Dr. Calton Dach nure if she is having any issues after her treatment yesterday with Ogivri.

## 2021-11-14 ENCOUNTER — Encounter: Payer: PPO | Admitting: Physical Therapy

## 2021-11-18 ENCOUNTER — Encounter: Payer: Self-pay | Admitting: *Deleted

## 2021-11-19 ENCOUNTER — Other Ambulatory Visit: Payer: Self-pay

## 2021-11-19 ENCOUNTER — Encounter: Payer: Self-pay | Admitting: Physical Therapy

## 2021-11-19 ENCOUNTER — Ambulatory Visit: Payer: PPO | Admitting: Physical Therapy

## 2021-11-19 DIAGNOSIS — M25611 Stiffness of right shoulder, not elsewhere classified: Secondary | ICD-10-CM

## 2021-11-19 DIAGNOSIS — D0511 Intraductal carcinoma in situ of right breast: Secondary | ICD-10-CM

## 2021-11-19 DIAGNOSIS — R600 Localized edema: Secondary | ICD-10-CM

## 2021-11-19 DIAGNOSIS — R293 Abnormal posture: Secondary | ICD-10-CM

## 2021-11-19 DIAGNOSIS — M25612 Stiffness of left shoulder, not elsewhere classified: Secondary | ICD-10-CM

## 2021-11-19 DIAGNOSIS — Z170 Estrogen receptor positive status [ER+]: Secondary | ICD-10-CM

## 2021-11-19 NOTE — Therapy (Signed)
[facility] @ [place] [place] [place], [place], [address] Phone: [phone]   Fax:  [phone]  Physical Therapy Treatment  Patient Details  Name: April [name] MRN: [id number] Date of Birth: Nov 08, 1955 Referring Provider (PT): Dr. Autumn Messing   Encounter Date: 11/19/2021   PT End of Session - 11/19/21 1353     Visit Number 8    Number of Visits 10    Date for PT Re-Evaluation 11/26/21    PT Start Time 1304    PT Stop Time 8916    PT Time Calculation (min) 49 min    Activity Tolerance Patient tolerated treatment well    Behavior During Therapy [facility] for tasks assessed/performed             Past Medical History:  Diagnosis Date   Anxiety    Arthritis    Cancer ([place])    COPD (chronic obstructive pulmonary disease) ([place])    Depression    GERD (gastroesophageal reflux disease)    Headache(784.0)    Hyperlipidemia    Hypertension    Incontinent of urine    Shortness of breath    Stroke [facility])     Past Surgical History:  Procedure Laterality Date   CAROTID STENT     Has known BICA occlusions since 2011; s/p left vertebral artery stent 12/26/09 & right VA stent 02/12/10   CHOLECYSTECTOMY     PORTACATH PLACEMENT Right 08/19/2021   Procedure: INSERTION PORT-A-CATH WITH ULTRASOUND;  Surgeon: [name], MD;  Location: [facility];  Service: General;  Laterality: Right;   ROTATOR CUFF REPAIR     SENTINEL NODE BIOPSY Bilateral 08/19/2021   Procedure: BILATERAL SENTINEL NODE BIOPSY;  Surgeon: [name], MD;  Location: [facility];  Service: General;  Laterality: Bilateral;   TOTAL MASTECTOMY Bilateral 08/19/2021   Procedure: BILATERAL TOTAL MASTECTOMY;  Surgeon: [name], MD;  Location: [facility];  Service: General;  Laterality: Bilateral;   TUBAL LIGATION      There were no vitals filed for this visit.   Subjective Assessment - 11/19/21 1315     Subjective My arms are doing better and I am not having the pain across my  chest.    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 The left breast mass measures 1.7 cm and is located in the upper outer quadrant. It is triple positive with a Ki67 of 40%. The right breast DCIS is ER/PR positive. 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 She smokes 1 pack/day; bilateral mastectomies on 08/19/21 R 0/4 nodes L 0/3 nodes, TIA, CHF, polycythemia    Patient Stated Goals to do what you want me to do and get out of here    Currently in Pain? No/denies    Pain Score 0-No pain                [facility] PT Assessment - 11/19/21 0001       AROM   Right Shoulder Flexion 155 Degrees    Right Shoulder ABduction 172 Degrees    Left Shoulder Flexion 138 Degrees    Left Shoulder ABduction 149 Degrees                           [facility] Adult PT Treatment/Exercise - 11/19/21 0001  Shoulder Exercises: Pulleys   Flexion 2 minutes    ABduction 2 minutes      Shoulder Exercises: Therapy Ball   Flexion Both;5 reps      Manual Therapy   Soft tissue mobilization using cocoa butter to bilateral pecs where pt feels tightness    Manual Lymphatic Drainage (MLD) In Supine: Short neck, Rt inguinal and Rt axillo-inguinal anastomosis continuing to work on lateral trunk where pt's swelling is worse then retracing all steps                          PT Long Term Goals - 11/19/21 1316       PT LONG TERM GOAL #1   Title Patient will demonstrate she has regained full shoulder ROM and function post operatively compared to baselines.    Time 8    Period Weeks    Status Achieved      PT LONG TERM GOAL #2   Title Pt will not demonstrate any rippling edema across bilateral chest to decrease risk of infection.    Time 4    Period Weeks    Status On-going      PT LONG TERM GOAL #3   Title Pt will be independent  in self MLD for long term management of edema    Time 4    Period Weeks    Status On-going      PT LONG TERM GOAL #4   Title Pt will be independent in a home exercise program for continued stretching and strengthening    Time 4    Period Weeks    Status On-going      PT LONG TERM GOAL #5   Title Pt will report 75% improvement in tightness and discomfort across medial chest to allow improved comfort.    Baseline 10/29/21- no improvement (pt has only been to 1 treatment session) 11/19/21- at least 50% improvement    Time 4    Period Weeks    Status On-going                   Plan - 11/19/21 1354     Clinical Impression Statement Pt reports her arm is feeling better and she is having less pain across her chest. Her R chest has rippling edema across it this visit so continued with MLD to this side. Also performed soft tissue mobilization to bilateral pecs to help improve ROM and decrease discomfort. Will instruct pt in supine scapular strengthening series at next session.    PT Frequency 2x / week    PT Duration 4 weeks    PT Treatment/Interventions ADLs/Self Care Home Management;Therapeutic exercise;Patient/family education;Manual techniques;Manual lymph drainage;Compression bandaging;Passive range of motion;Scar mobilization    PT Next Visit Plan Cont MLD across bilateral chest and review with pt, instruct in supine scap    PT Home Exercise Plan Post op HEP, self MLD    Consulted and Agree with Plan of Care Patient             Patient will benefit from skilled therapeutic intervention in order to improve the following deficits and impairments:  Postural dysfunction, Decreased range of motion, Decreased knowledge of precautions, Pain, Increased edema  Visit Diagnosis: Stiffness of right shoulder, not elsewhere classified  Stiffness of left shoulder, not elsewhere classified  Localized edema  Abnormal posture  Ductal carcinoma in situ (DCIS) of right  breast  Malignant neoplasm of upper-outer quadrant of left breast  in female, estrogen receptor positive ([place])     Problem List Patient Active Problem List   Diagnosis Date Noted   Port-A-Cath in place 10/02/2021   Bilateral breast cancer ([place]) 08/19/2021   Genetic testing 07/26/2021   Family history of breast cancer 07/10/2021   Family history of bone cancer 07/10/2021   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   Malignant neoplasm of upper-outer quadrant of left breast in female, estrogen receptor positive ([place]) 07/08/2021   Back pain 04/04/2016   Weakness    Confusion    Orthostatic dizziness    Left-sided weakness 11/10/2015   Polycythemia 11/10/2015   Chronic diastolic CHF (congestive heart failure) ([place]) 11/10/2015   Hypercarbia 11/10/2015   Respiratory acidosis 11/10/2015   Paresthesias in right hand 07/11/2015   Migraine without aura 01/30/2014   Occlusion of extracranial carotid artery 01/30/2014   Transient cerebral ischemia 01/30/2014   Headache(784.0) 01/04/2013   Dizziness 01/04/2013   TIA (transient ischemic attack) 01/04/2013   Migraine 01/04/2013   COPD (chronic obstructive pulmonary disease) ([place]) 01/04/2013    Allyson Sabal [place], PT 11/19/2021, 1:58 PM  [facility] @ [place] [place], [place], [address] Phone: [phone]   Fax:  [phone]  Name: April [name] MRN: [id number] Date of Birth: Jun 20, 1956

## 2021-11-21 ENCOUNTER — Encounter: Payer: Self-pay | Admitting: Physical Therapy

## 2021-11-21 ENCOUNTER — Ambulatory Visit: Payer: PPO | Admitting: Physical Therapy

## 2021-11-21 ENCOUNTER — Other Ambulatory Visit: Payer: Self-pay

## 2021-11-21 DIAGNOSIS — C50412 Malignant neoplasm of upper-outer quadrant of left female breast: Secondary | ICD-10-CM

## 2021-11-21 DIAGNOSIS — M25612 Stiffness of left shoulder, not elsewhere classified: Secondary | ICD-10-CM

## 2021-11-21 DIAGNOSIS — D0511 Intraductal carcinoma in situ of right breast: Secondary | ICD-10-CM

## 2021-11-21 DIAGNOSIS — M25611 Stiffness of right shoulder, not elsewhere classified: Secondary | ICD-10-CM | POA: Diagnosis not present

## 2021-11-21 DIAGNOSIS — Z170 Estrogen receptor positive status [ER+]: Secondary | ICD-10-CM

## 2021-11-21 DIAGNOSIS — R293 Abnormal posture: Secondary | ICD-10-CM

## 2021-11-21 DIAGNOSIS — R600 Localized edema: Secondary | ICD-10-CM

## 2021-11-21 NOTE — Therapy (Signed)
[facility] @ [place], [place], [address] Phone: [phone]   Fax:  [phone]  Physical Therapy Treatment  Patient Details  Name: April [name] MRN: [id number] Date of Birth: 05-07-1956 Referring Provider (PT): Dr. Autumn Messing   Encounter Date: 11/21/2021   PT End of Session - 11/21/21 1556     Visit Number 9    Number of Visits 10    Date for PT Re-Evaluation 11/26/21    PT Start Time 9977    PT Stop Time 4142    PT Time Calculation (min) 58 min    Activity Tolerance Patient tolerated treatment well    Behavior During Therapy [facility] for tasks assessed/performed             Past Medical History:  Diagnosis Date   Anxiety    Arthritis    Cancer ([place])    COPD (chronic obstructive pulmonary disease) ([place])    Depression    GERD (gastroesophageal reflux disease)    Headache(784.0)    Hyperlipidemia    Hypertension    Incontinent of urine    Shortness of breath    Stroke [facility])     Past Surgical History:  Procedure Laterality Date   CAROTID STENT     Has known BICA occlusions since 2011; s/p left vertebral artery stent 12/26/09 & right VA stent 02/12/10   CHOLECYSTECTOMY     PORTACATH PLACEMENT Right 08/19/2021   Procedure: INSERTION PORT-A-CATH WITH ULTRASOUND;  Surgeon: [name], MD;  Location: [place];  Service: General;  Laterality: Right;   ROTATOR CUFF REPAIR     SENTINEL NODE BIOPSY Bilateral 08/19/2021   Procedure: BILATERAL SENTINEL NODE BIOPSY;  Surgeon: [name], MD;  Location: [place];  Service: General;  Laterality: Bilateral;   TOTAL MASTECTOMY Bilateral 08/19/2021   Procedure: BILATERAL TOTAL MASTECTOMY;  Surgeon: [name], MD;  Location: [place];  Service: General;  Laterality: Bilateral;   TUBAL LIGATION      There were no vitals filed for this visit.   Subjective Assessment - 11/21/21 1504     Subjective I am not having the pain as bad across my chest.    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 The left breast mass measures 1.7 cm and is located in the upper outer quadrant. It is triple positive with a Ki67 of 40%. The right breast DCIS is ER/PR positive. 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 She smokes 1 pack/day; bilateral mastectomies on 08/19/21 R 0/4 nodes L 0/3 nodes, TIA, CHF, polycythemia    Patient Stated Goals to do what you want me to do and get out of here    Currently in Pain? No/denies    Pain Score 0-No pain                    L-DEX FLOWSHEETS - 11/21/21 1500       L-DEX LYMPHEDEMA SCREENING   Measurement Type Bilateral    L-DEX MEASUREMENT EXTREMITY Upper Extremity    POSITION  Standing    DOMINANT SIDE Right    BASELINE RIGHT -3.9    BASELINE LEFT -4.2    RIGHT SIDE SCORE -1.7    LEFT SIDE SCORE -2.3    VALUE CHANGE RIGHT 2.2    VALUE CHANGE LEFT 1.9  [facility] Adult PT Treatment/Exercise - 11/21/21 0001       Shoulder Exercises: Supine   Horizontal ABduction Strengthening;Both;10 reps;Theraband   pt returned therapist demo   Theraband Level (Shoulder Horizontal ABduction) Level 1 (Yellow)    External Rotation Strengthening;Both;10 reps;Theraband   pt returned therapist demo   Theraband Level (Shoulder External Rotation) Level 1 (Yellow)    Flexion Strengthening;Both;10 reps;Theraband   narrow and wide grip, pt returned therapist grip, v/c to keep elbow straight   Theraband Level (Shoulder Flexion) Level 1 (Yellow)    Diagonals Strengthening;Both;10 reps;Theraband   pt returned therapist demo   Theraband Level (Shoulder Diagonals) Level 1 (Yellow)      Shoulder Exercises: Pulleys   Flexion 2 minutes    ABduction 2 minutes      Manual Therapy   Manual Lymphatic Drainage (MLD) In Supine: Short neck, Rt inguinal and Rt  axillo-inguinal anastomosis continuing to work on lateral trunk where pt's swelling is worse then retracing all steps                          PT Long Term Goals - 11/19/21 1316       PT LONG TERM GOAL #1   Title Patient will demonstrate she has regained full shoulder ROM and function post operatively compared to baselines.    Time 8    Period Weeks    Status Achieved      PT LONG TERM GOAL #2   Title Pt will not demonstrate any rippling edema across bilateral chest to decrease risk of infection.    Time 4    Period Weeks    Status On-going      PT LONG TERM GOAL #3   Title Pt will be independent in self MLD for long term management of edema    Time 4    Period Weeks    Status On-going      PT LONG TERM GOAL #4   Title Pt will be independent in a home exercise program for continued stretching and strengthening    Time 4    Period Weeks    Status On-going      PT LONG TERM GOAL #5   Title Pt will report 75% improvement in tightness and discomfort across medial chest to allow improved comfort.    Baseline 10/29/21- no improvement (pt has only been to 1 treatment session) 11/19/21- at least 50% improvement    Time 4    Period Weeks    Status On-going                   Plan - 11/21/21 1556     Clinical Impression Statement Instructed pt in supine scapular strengthening series and issued yellow theraband to pt to do these at home. Pt's edema across her chest seems somewhat improved from last session. Continued with MLD to R chest. Remeasured L dex score today since she has not had this done since baseline. Her change from baseline on the L was 1.9 and on the R was 2.2 so she is not demonstrating any signs of clinical lymphedema at this time.    PT Frequency 2x / week    PT Duration 4 weeks    PT Treatment/Interventions ADLs/Self Care Home Management;Therapeutic exercise;Patient/family education;Manual techniques;Manual lymph drainage;Compression  bandaging;Passive range of motion;Scar mobilization    PT Next Visit Plan Cont MLD across bilateral chest and review with pt, instruct in supine scap  PT Home Exercise Plan Post op HEP, self MLD    Consulted and Agree with Plan of Care Patient             Patient will benefit from skilled therapeutic intervention in order to improve the following deficits and impairments:  Postural dysfunction, Decreased range of motion, Decreased knowledge of precautions, Pain, Increased edema  Visit Diagnosis: Stiffness of right shoulder, not elsewhere classified  Stiffness of left shoulder, not elsewhere classified  Localized edema  Abnormal posture  Ductal carcinoma in situ (DCIS) of right breast  Malignant neoplasm of upper-outer quadrant of left breast in female, estrogen receptor positive ([place])     Problem List Patient Active Problem List   Diagnosis Date Noted   Port-A-Cath in place 10/02/2021   Bilateral breast cancer ([place]) 08/19/2021   Genetic testing 07/26/2021   Family history of breast cancer 07/10/2021   Family history of bone cancer 07/10/2021   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   Malignant neoplasm of upper-outer quadrant of left breast in female, estrogen receptor positive ([place]) 07/08/2021   Back pain 04/04/2016   Weakness    Confusion    Orthostatic dizziness    Left-sided weakness 11/10/2015   Polycythemia 11/10/2015   Chronic diastolic CHF (congestive heart failure) ([place]) 11/10/2015   Hypercarbia 11/10/2015   Respiratory acidosis 11/10/2015   Paresthesias in right hand 07/11/2015   Migraine without aura 01/30/2014   Occlusion of extracranial carotid artery 01/30/2014   Transient cerebral ischemia 01/30/2014   Headache(784.0) 01/04/2013   Dizziness 01/04/2013   TIA (transient ischemic attack) 01/04/2013   Migraine 01/04/2013   COPD (chronic obstructive pulmonary disease) ([place]) 01/04/2013    Allyson Sabal [place], PT 11/21/2021,  3:59 PM  [facility] @ [place], [place], [address] Phone: [phone]   Fax:  [phone]  Name: April [name] MRN: [id number] Date of Birth: [birth year]

## 2021-11-21 NOTE — Patient Instructions (Signed)
Over Head Pull: Narrow and Wide Grip   Cancer Rehab [phone]   On back, knees bent, feet flat, band across thighs, elbows straight but relaxed. Pull hands apart (start). Keeping elbows straight, bring arms up and over head, hands toward floor. Keep pull steady on band. Hold momentarily. Return slowly, keeping pull steady, back to start. Then do same with a wider grip on the band (past shoulder width) Repeat _10__ times. Band color __yellow____   Side Pull: Double Arm   On back, knees bent, feet flat. Arms perpendicular to body, shoulder level, elbows straight but relaxed. Pull arms out to sides, elbows straight. Resistance band comes across collarbones, hands toward floor. Hold momentarily. Slowly return to starting position. Repeat _10__ times. Band color _yellow____   Sword   On back, knees bent, feet flat, left hand on left hip, right hand above left. Pull right arm DIAGONALLY (hip to shoulder) across chest. Bring right arm along head toward floor. Thumb is pointed down towards opposite hip and then rotates upwards when by head. Hold momentarily. Slowly return to starting position. Repeat _10__ times. Do with left arm. Band color _yellow_____   Shoulder Rotation: Double Arm   On back, knees bent, feet flat, elbows tucked at sides, bent 90, hands palms up. Pull hands apart and down toward floor, keeping elbows near sides. Hold momentarily. Slowly return to starting position. Repeat _10__ times. Band color __yellow____

## 2021-11-26 ENCOUNTER — Other Ambulatory Visit: Payer: Self-pay

## 2021-11-26 ENCOUNTER — Ambulatory Visit (HOSPITAL_COMMUNITY)
Admission: RE | Admit: 2021-11-26 | Discharge: 2021-11-26 | Disposition: A | Discharge status: Home / Self Care | Payer: PPO | Source: Ambulatory Visit | Attending: Hematology and Oncology | Admitting: Hematology and Oncology

## 2021-11-26 DIAGNOSIS — Z5181 Encounter for therapeutic drug level monitoring: Secondary | ICD-10-CM | POA: Insufficient documentation

## 2021-11-26 DIAGNOSIS — R0602 Shortness of breath: Secondary | ICD-10-CM | POA: Diagnosis not present

## 2021-11-26 DIAGNOSIS — Z8673 Personal history of transient ischemic attack (TIA), and cerebral infarction without residual deficits: Secondary | ICD-10-CM | POA: Insufficient documentation

## 2021-11-26 DIAGNOSIS — E785 Hyperlipidemia, unspecified: Secondary | ICD-10-CM | POA: Diagnosis not present

## 2021-11-26 DIAGNOSIS — Z0189 Encounter for other specified special examinations: Secondary | ICD-10-CM | POA: Diagnosis not present

## 2021-11-26 DIAGNOSIS — Z01818 Encounter for other preprocedural examination: Secondary | ICD-10-CM | POA: Diagnosis not present

## 2021-11-26 DIAGNOSIS — I10 Essential (primary) hypertension: Secondary | ICD-10-CM | POA: Diagnosis not present

## 2021-11-26 DIAGNOSIS — Z79899 Other long term (current) drug therapy: Secondary | ICD-10-CM | POA: Insufficient documentation

## 2021-11-26 DIAGNOSIS — J449 Chronic obstructive pulmonary disease, unspecified: Secondary | ICD-10-CM | POA: Diagnosis not present

## 2021-11-26 LAB — ECHOCARDIOGRAM COMPLETE
AR max vel: 2.43 cm2
AV Area VTI: 2.44 cm2
AV Area mean vel: 2.31 cm2
AV Mean grad: 5 mmHg
AV Peak grad: 8.6 mmHg
Ao pk vel: 1.47 m/s
Area-P 1/2: 3.34 cm2
Calc EF: 64.5 %
S' Lateral: 3 cm
Single Plane A2C EF: 66.6 %
Single Plane A4C EF: 60.1 %

## 2021-11-26 NOTE — Progress Notes (Signed)
° °  Echocardiogram 2D Echocardiogram has been performed.  April [name] 11/26/2021, 11:07 AM

## 2021-12-03 ENCOUNTER — Other Ambulatory Visit: Payer: Self-pay

## 2021-12-03 ENCOUNTER — Inpatient Hospital Stay: Payer: PPO | Attending: Hematology and Oncology

## 2021-12-03 VITALS — BP 134/75 | HR 80 | Temp 98.0°F | Resp 18 | Ht 63.0 in | Wt 223.5 lb

## 2021-12-03 DIAGNOSIS — C50412 Malignant neoplasm of upper-outer quadrant of left female breast: Secondary | ICD-10-CM | POA: Diagnosis present

## 2021-12-03 DIAGNOSIS — Z170 Estrogen receptor positive status [ER+]: Secondary | ICD-10-CM | POA: Insufficient documentation

## 2021-12-03 DIAGNOSIS — Z5112 Encounter for antineoplastic immunotherapy: Secondary | ICD-10-CM | POA: Insufficient documentation

## 2021-12-03 DIAGNOSIS — C50911 Malignant neoplasm of unspecified site of right female breast: Secondary | ICD-10-CM

## 2021-12-03 DIAGNOSIS — D0511 Intraductal carcinoma in situ of right breast: Secondary | ICD-10-CM | POA: Insufficient documentation

## 2021-12-03 MED ORDER — SODIUM CHLORIDE 0.9% FLUSH
10.0000 mL | INTRAVENOUS | Status: DC | PRN
  Administered 2021-12-03: 10 mL

## 2021-12-03 MED ORDER — HEPARIN SOD (PORK) LOCK FLUSH 100 UNIT/ML IV SOLN
500.0000 [IU] | Freq: Once | INTRAVENOUS | Status: AC | PRN
  Administered 2021-12-03: 500 [IU]

## 2021-12-03 MED ORDER — TRASTUZUMAB-DKST CHEMO 150 MG IV SOLR
6.0000 mg/kg | Freq: Once | INTRAVENOUS | Status: AC
  Administered 2021-12-03: 567 mg via INTRAVENOUS
  Filled 2021-12-03: qty 27

## 2021-12-03 MED ORDER — ACETAMINOPHEN 325 MG PO TABS: 650.0000 mg | ORAL_TABLET | Freq: Once | ORAL | Status: DC

## 2021-12-03 MED ORDER — SODIUM CHLORIDE 0.9 % IV SOLN: Freq: Once | INTRAVENOUS | Status: AC

## 2021-12-03 MED ORDER — DIPHENHYDRAMINE HCL 25 MG PO CAPS
25.0000 mg | ORAL_CAPSULE | Freq: Once | ORAL | Status: AC
  Administered 2021-12-03: 25 mg via ORAL
  Filled 2021-12-03: qty 1

## 2021-12-03 NOTE — Patient Instructions (Addendum)
[facility]COL[facility]  Discharge Instructions: Thank you for choosing [facility] to provide your oncology and hematology care.   If you have a lab appointment with the [facility], please go directly to the [facility] and check in at the registration area.   Wear comfortable clothing and clothing appropriate for easy access to any Portacath or PICC line.   We strive to give you quality time with your provider. You may need to reschedule your appointment if you arrive late (15 or more minutes).  Arriving late affects you and other patients whose appointments are after yours.  Also, if you miss three or more appointments without notifying the office, you may be dismissed from the clinic at the providers discretion.      For prescription refill requests, have your pharmacy contact our office and allow 72 hours for refills to be completed.    Today you received the following chemotherapy and/or immunotherapy agents: trastuzumab      To help prevent nausea and vomiting after your treatment, we encourage you to take your nausea medication as directed.  BELOW ARE SYMPTOMS THAT SHOULD BE REPORTED IMMEDIATELY: *FEVER GREATER THAN 100.4 F (38 C) OR HIGHER *CHILLS OR SWEATING *NAUSEA AND VOMITING THAT IS NOT CONTROLLED WITH YOUR NAUSEA MEDICATION *UNUSUAL SHORTNESS OF BREATH *UNUSUAL BRUISING OR BLEEDING *URINARY PROBLEMS (pain or burning when urinating, or frequent urination) *BOWEL PROBLEMS (unusual diarrhea, constipation, pain near the anus) TENDERNESS IN MOUTH AND THROAT WITH OR WITHOUT PRESENCE OF ULCERS (sore throat, sores in mouth, or a toothache) UNUSUAL RASH, SWELLING OR PAIN  UNUSUAL VAGINAL DISCHARGE OR ITCHING   Items with * indicate a potential emergency and should be followed up as soon as possible or go to the Emergency Department if any problems should occur.  Please show the CHEMOTHERAPY ALERT CARD or IMMUNOTHERAPY ALERT CARD at check-in  to the Emergency Department and triage nurse.  Should you have questions after your visit or need to cancel or reschedule your appointment, please contact [facility]  Dept: [phone]  and follow the prompts.  Office hours are 8:00 a.m. to 4:30 p.m. Monday - Friday. Please note that voicemails left after 4:00 p.m. may not be returned until the following business day.  We are closed weekends and major holidays. You have access to a nurse at all times for urgent questions. Please call the main number to the clinic Dept: [phone] and follow the prompts.   For any non-urgent questions, you may also contact your provider using MyChart. We now offer e-Visits for anyone 39 and older to request care online for non-urgent symptoms. For details visit [email or web address].   Also download the MyChart app! Go to the app store, search "MyChart", open the app, select [facility], and log in with your MyChart username and password.  Due to Covid, a mask is required upon entering the hospital/clinic. If you do not have a mask, one will be given to you upon arrival. For doctor visits, patients may have 1 support person aged 18 or older with them. For treatment visits, patients cannot have anyone with them due to current Covid guidelines and our immunocompromised population.   Trastuzumab injection for infusion What is this medication? TRASTUZUMAB (tras TOO zoo mab) is a monoclonal antibody. It is used to treat breast cancer and stomach cancer. This medicine may be used for other purposes; ask your health care provider or pharmacist if you have questions. COMMON BRAND NAME(S):  Herceptin, Donnald Garre What should I tell my care team before I take this medication? They need to know if you have any of these conditions: heart disease heart failure lung or breathing disease, like asthma an unusual or allergic reaction to trastuzumab, benzyl  alcohol, or other medications, foods, dyes, or preservatives pregnant or trying to get pregnant breast-feeding How should I use this medication? This drug is given as an infusion into a vein. It is administered in a hospital or clinic by a specially trained health care professional. Talk to your pediatrician regarding the use of this medicine in children. This medicine is not approved for use in children. Overdosage: If you think you have taken too much of this medicine contact a poison control center or emergency room at once. NOTE: This medicine is only for you. Do not share this medicine with others. What if I miss a dose? It is important not to miss a dose. Call your doctor or health care professional if you are unable to keep an appointment. What may interact with this medication? This medicine may interact with the following medications: certain types of chemotherapy, such as daunorubicin, doxorubicin, epirubicin, and idarubicin This list may not describe all possible interactions. Give your health care provider a list of all the medicines, herbs, non-prescription drugs, or dietary supplements you use. Also tell them if you smoke, drink alcohol, or use illegal drugs. Some items may interact with your medicine. What should I watch for while using this medication? Visit your doctor for checks on your progress. Report any side effects. Continue your course of treatment even though you feel ill unless your doctor tells you to stop. Call your doctor or health care professional for advice if you get a fever, chills or sore throat, or other symptoms of a cold or flu. Do not treat yourself. Try to avoid being around people who are sick. You may experience fever, chills and shaking during your first infusion. These effects are usually mild and can be treated with other medicines. Report any side effects during the infusion to your health care professional. Fever and chills usually do not happen with  later infusions. Do not become pregnant while taking this medicine or for 7 months after stopping it. Women should inform their doctor if they wish to become pregnant or think they might be pregnant. Women of child-bearing potential will need to have a negative pregnancy test before starting this medicine. There is a potential for serious side effects to an unborn child. Talk to your health care professional or pharmacist for more information. Do not breast-feed an infant while taking this medicine or for 7 months after stopping it. Women must use effective birth control with this medicine. What side effects may I notice from receiving this medication? Side effects that you should report to your doctor or health care professional as soon as possible: allergic reactions like skin rash, itching or hives, swelling of the face, lips, or tongue chest pain or palpitations cough dizziness feeling faint or lightheaded, falls fever general ill feeling or flu-like symptoms signs of worsening heart failure like breathing problems; swelling in your legs and feet unusually weak or tired Side effects that usually do not require medical attention (report to your doctor or health care professional if they continue or are bothersome): bone pain changes in taste diarrhea joint pain nausea/vomiting weight loss This list may not describe all possible side effects. Call your doctor for medical advice about side effects.  You may report side effects to FDA at 1-800-FDA-1088. Where should I keep my medication? This drug is given in a hospital or clinic and will not be stored at home. NOTE: This sheet is a summary. It may not cover all possible information. If you have questions about this medicine, talk to your doctor, pharmacist, or health care provider.  2022 Elsevier/Gold Standard (2016-10-28 00:00:00)

## 2021-12-04 ENCOUNTER — Ambulatory Visit: Payer: PPO | Admitting: Physical Therapy

## 2021-12-09 ENCOUNTER — Encounter: Payer: Self-pay | Admitting: Physical Therapy

## 2021-12-09 ENCOUNTER — Ambulatory Visit: Payer: PPO | Attending: General Surgery | Admitting: Physical Therapy

## 2021-12-09 ENCOUNTER — Other Ambulatory Visit: Payer: Self-pay

## 2021-12-09 DIAGNOSIS — M25612 Stiffness of left shoulder, not elsewhere classified: Secondary | ICD-10-CM | POA: Insufficient documentation

## 2021-12-09 DIAGNOSIS — C50412 Malignant neoplasm of upper-outer quadrant of left female breast: Secondary | ICD-10-CM | POA: Diagnosis present

## 2021-12-09 DIAGNOSIS — R293 Abnormal posture: Secondary | ICD-10-CM | POA: Diagnosis present

## 2021-12-09 DIAGNOSIS — R600 Localized edema: Secondary | ICD-10-CM | POA: Diagnosis present

## 2021-12-09 DIAGNOSIS — M25611 Stiffness of right shoulder, not elsewhere classified: Secondary | ICD-10-CM | POA: Insufficient documentation

## 2021-12-09 DIAGNOSIS — D0511 Intraductal carcinoma in situ of right breast: Secondary | ICD-10-CM | POA: Insufficient documentation

## 2021-12-09 DIAGNOSIS — Z170 Estrogen receptor positive status [ER+]: Secondary | ICD-10-CM | POA: Insufficient documentation

## 2021-12-09 NOTE — Therapy (Signed)
[facility] @ [place], [place], [address] Phone: [phone]   Fax:  [phone]  Physical Therapy Treatment  Patient Details  Name: April [name] MRN: [id number] Date of Birth: Feb 16, 1956 Referring Provider (PT): Dr. Autumn Messing   Encounter Date: 12/09/2021   PT End of Session - 12/09/21 1407     Visit Number 10    Number of Visits 18    Date for PT Re-Evaluation 01/06/22    PT Start Time 1406    PT Stop Time 1452    PT Time Calculation (min) 46 min    Activity Tolerance Patient tolerated treatment well    Behavior During Therapy [facility] for tasks assessed/performed             Past Medical History:  Diagnosis Date   Anxiety    Arthritis    Cancer ([place])    COPD (chronic obstructive pulmonary disease) ([place])    Depression    GERD (gastroesophageal reflux disease)    Headache(784.0)    Hyperlipidemia    Hypertension    Incontinent of urine    Shortness of breath    Stroke [facility])     Past Surgical History:  Procedure Laterality Date   CAROTID STENT     Has known BICA occlusions since 2011; s/p left vertebral artery stent 12/26/09 & right VA stent 02/12/10   CHOLECYSTECTOMY     PORTACATH PLACEMENT Right 08/19/2021   Procedure: INSERTION PORT-A-CATH WITH ULTRASOUND;  Surgeon: [name], MD;  Location: [place];  Service: General;  Laterality: Right;   ROTATOR CUFF REPAIR     SENTINEL NODE BIOPSY Bilateral 08/19/2021   Procedure: BILATERAL SENTINEL NODE BIOPSY;  Surgeon: [name], MD;  Location: [employer];  Service: General;  Laterality: Bilateral;   TOTAL MASTECTOMY Bilateral 08/19/2021   Procedure: BILATERAL TOTAL MASTECTOMY;  Surgeon: [name], MD;  Location: [place];  Service: General;  Laterality: Bilateral;   TUBAL LIGATION      There were no vitals filed for this visit.       [facility] PT Assessment - 12/09/21 0001       AROM   Right Shoulder Flexion 155 Degrees    Right Shoulder  ABduction 173 Degrees    Left Shoulder Flexion 148 Degrees    Left Shoulder ABduction 158 Degrees                           [facility] Adult PT Treatment/Exercise - 12/09/21 0001       Manual Therapy   Manual Lymphatic Drainage (MLD) In Supine: Short neck, Rt inguinal and Rt axillo-inguinal anastomosisl, anterior R chest and continuing to work on lateral trunk where pt's swelling is worse then retracing all steps while instructing pt in self MLD technique and having pt return demonstrate                          PT Long Term Goals - 12/09/21 1410       PT LONG TERM GOAL #1   Title Patient will demonstrate she has regained full shoulder ROM and function post operatively compared to baselines.    Time 8    Period Weeks    Status Achieved      PT LONG TERM GOAL #2   Title Pt will not demonstrate any rippling edema across bilateral chest to decrease risk of infection.  Baseline 12/09/21- pt still has rippling edema on R side    Time 4    Period Weeks    Status On-going      PT LONG TERM GOAL #3   Title Pt will be independent in self MLD for long term management of edema    Baseline 12/09/21- pt does not feel independent with this    Time 4    Period Weeks    Status On-going      PT LONG TERM GOAL #4   Title Pt will be independent in a home exercise program for continued stretching and strengthening    Time 4    Period Weeks    Status On-going      PT LONG TERM GOAL #5   Title Pt will report 75% improvement in tightness and discomfort across medial chest to allow improved comfort.    Baseline 10/29/21- no improvement (pt has only been to 1 treatment session) 11/19/21- at least 50% improvement 12/09/21- pt reports this is 75% improved    Time 4    Period Weeks    Status Achieved                   Plan - 12/09/21 1454     Clinical Impression Statement Assessed pt's progress towards goals in therapy. Pt has met nearly all goals for therapy  but is still demonstrating rippling edema across her R chest. Pt did not feel independent with self MLD. Will extend pt's plan of care to instruct pt in self MLD and to see if her doctor would like her to continue PT to try to decrease swelling in R chest. Pt plans on reaching out to her doctor to see if she needs to have the fluid drained again. Pt would benefit from additional skilled PT services to decrease edema in R chest to decrease risk of infection and instruct pt in self MLD.    PT Frequency 2x / week    PT Duration 4 weeks    PT Treatment/Interventions ADLs/Self Care Home Management;Therapeutic exercise;Patient/family education;Manual techniques;Manual lymph drainage;Compression bandaging;Passive range of motion;Scar mobilization    PT Next Visit Plan Cont MLD across bilateral chest and review with pt    PT Home Exercise Plan Post op HEP, self MLD    Consulted and Agree with Plan of Care Patient             Patient will benefit from skilled therapeutic intervention in order to improve the following deficits and impairments:  Postural dysfunction, Decreased range of motion, Decreased knowledge of precautions, Pain, Increased edema  Visit Diagnosis: Stiffness of right shoulder, not elsewhere classified  Stiffness of left shoulder, not elsewhere classified  Localized edema  Abnormal posture  Ductal carcinoma in situ (DCIS) of right breast  Malignant neoplasm of upper-outer quadrant of left breast in female, estrogen receptor positive ([place])     Problem List Patient Active Problem List   Diagnosis Date Noted   Port-A-Cath in place 10/02/2021   Bilateral breast cancer ([place]) 08/19/2021   Genetic testing 07/26/2021   Family history of breast cancer 07/10/2021   Family history of bone cancer 07/10/2021   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   Malignant neoplasm of upper-outer quadrant of left breast in female, estrogen receptor positive ([place]) 07/08/2021    Back pain 04/04/2016   Weakness    Confusion    Orthostatic dizziness    Left-sided weakness 11/10/2015   Polycythemia 11/10/2015  Chronic diastolic CHF (congestive heart failure) ([place]) 11/10/2015   Hypercarbia 11/10/2015   Respiratory acidosis 11/10/2015   Paresthesias in right hand 07/11/2015   Migraine without aura 01/30/2014   Occlusion of extracranial carotid artery 01/30/2014   Transient cerebral ischemia 01/30/2014   Headache(784.0) 01/04/2013   Dizziness 01/04/2013   TIA (transient ischemic attack) 01/04/2013   Migraine 01/04/2013   COPD (chronic obstructive pulmonary disease) ([place]) 01/04/2013    Allyson Sabal [place], PT 12/09/2021, 2:58 PM  [facility] @ [place], [place], [address] Phone: [phone]   Fax:  [phone]  Name: Keitha Kolk MRN: [id number] Date of Birth: 06-05-1956

## 2021-12-12 ENCOUNTER — Ambulatory Visit: Payer: PPO | Admitting: Physical Therapy

## 2021-12-17 ENCOUNTER — Telehealth: Payer: Self-pay | Admitting: *Deleted

## 2021-12-17 NOTE — Telephone Encounter (Signed)
Received VM from pt, RN attempt x1 to contact pt.  No answer, LVM for pt to return call to the office.

## 2021-12-23 NOTE — Progress Notes (Signed)
Patient Care Team: [name], MD as PCP - General (Family Medicine) [name], RN as Oncology Nurse Navigator [name], RN as Oncology Nurse Navigator [name], MD as Consulting Physician (General Surgery) [name], MD as Consulting Physician (Hematology and Oncology) [name], MD as Consulting Physician (Radiation Oncology)  DIAGNOSIS:    ICD-10-CM   1. Malignant neoplasm of upper-outer quadrant of left breast in female, estrogen receptor positive ([name])  C50.412    Z17.0       SUMMARY OF ONCOLOGIC HISTORY: Oncology History  Ductal carcinoma in situ (DCIS) of right breast  06/28/2021 Initial Diagnosis   Palpable mass in the left breast. Diagnostic mammogram and [place]: suspicious 1.7 cm mass in the left breast at the 1 o'clock position and 2 subtle areas of distortion in the upper outer right breast.  Left breast biopsy: Grade 3 IDC high grade focal DCIS Her2+/ER+(90%)/PR+(70%) in the left breast, and DCIS involving sclerosing adenosis with calcifications ER+(100%)/PR+(70%) in the right breast.    07/08/2021 Initial Diagnosis   Ductal carcinoma in situ (DCIS) of right breast   08/19/2021 Surgery   Right mastectomy: Foci of intermediate to high-grade DCIS, sclerosing adenosis and complex sclerosing lesion, margins negative, 0/4 lymph nodes negative, ER 100%, PR 70% Left mastectomy: 2.5 cm grade 3 IDC with DCIS high-grade, margins negative, 0/4 lymph nodes negative ER 90%, PR 70%, HER2 positive, Ki-67 40%   Malignant neoplasm of upper-outer quadrant of left breast in female, estrogen receptor positive ([name])  06/28/2021 Initial Diagnosis   Palpable mass in the left breast. Diagnostic mammogram and [place]: suspicious 1.7 cm mass in the left breast at the 1 o'clock position and 2 subtle areas of distortion in the upper outer right breast.  Left breast biopsy: Grade 3 IDC high grade focal DCIS Her2+/ER+(90%)/PR+(70%) in the left breast, and DCIS involving sclerosing  adenosis with calcifications ER+(100%)/PR+(70%) in the right breast.    07/10/2021 Cancer Staging   Staging form: Breast, AJCC 8th Edition - Clinical stage from 07/10/2021: Stage IA (cT1c, cN0, cM0, G3, ER+, PR+, HER2+) - Signed by [name], MD on 07/10/2021 Stage prefix: Initial diagnosis Histologic grading system: 3 grade system     Genetic Testing   [facility] CustomNext (47 gene) Panel was negative. No pathogenic variants were identified. The report date is 07/23/2021.  The CustomNext-Cancer+RNAinsight panel offered by [facility] includes sequencing and rearrangement analysis for the following 47 genes:  APC, ATM, AXIN2, BARD1, BMPR1A, BRCA1, BRCA2, BRIP1, CDH1, CDK4, CDKN2A, CHEK2, DICER1, EPCAM, GREM1, HOXB13, MEN1, MLH1, MSH2, MSH3, MSH6, MUTYH, NBN, NF1, NF2, NTHL1, PALB2, PMS2, POLD1, POLE, PTEN, RAD51C, RAD51D, RECQL, RET, SDHA, SDHAF2, SDHB, SDHC, SDHD, SMAD4, SMARCA4, STK11, TP53, TSC1, TSC2, and VHL.  RNA data is routinely analyzed for use in variant interpretation for all genes.   Bilateral breast cancer ([name])  08/19/2021 Initial Diagnosis   Bilateral breast cancer ([name])   10/02/2021 -  Chemotherapy   Patient is on Treatment Plan : BREAST Trastuzumab q21d x 13 cycles       CHIEF COMPLIANT: Cycle 5 trastuzumab  INTERVAL HISTORY: April [name] is a 66 y.o. with above-mentioned history of bilateral breast cancer having undergone bilateral mastectomies, currently on chemotherapy with trastuzumab. She presents to the clinic today for treatment.  Tolerating Herceptin extremely well without any problems or concerns.  She does get slightly fatigued.  She is also tolerating letrozole reasonably well.  She gets hot flashes 4-5 times per day.  ALLERGIES:  has  No Known Allergies.  MEDICATIONS:  Current Outpatient Medications  Medication Sig Dispense Refill   acetaminophen (TYLENOL) 500 MG tablet Take 1,000 mg by mouth every 8 (eight) hours as needed for moderate pain.     albuterol  (VENTOLIN HFA) 108 (90 Base) MCG/ACT inhaler Inhale 2 puffs into the lungs every 6 (six) hours as needed for wheezing or shortness of breath.     aspirin-acetaminophen-caffeine (EXCEDRIN MIGRAINE) 250-250-65 MG per tablet Take 2 tablets by mouth every 6 (six) hours as needed for migraine.      clonazePAM (KLONOPIN) 0.5 MG tablet Take 0.5 mg by mouth in the morning, at noon, and at bedtime.  5   clopidogrel (PLAVIX) 75 MG tablet Take 75 mg by mouth daily.     fluticasone (FLONASE) 50 MCG/ACT nasal spray Place 2 sprays into both nostrils daily as needed for allergies or rhinitis.     letrozole (FEMARA) 2.5 MG tablet Take 1 tablet (2.5 mg total) by mouth daily. 90 tablet 3   lidocaine-prilocaine (EMLA) cream Apply to affected area once 30 g 3   methocarbamol (ROBAXIN) 500 MG tablet Take 1 tablet (500 mg total) by mouth every 6 (six) hours as needed for muscle spasms. 30 tablet 2   nicotine (NICODERM CQ - DOSED IN MG/24 HR) 7 mg/24hr patch Place 1 patch (7 mg total) onto the skin daily. (Patient not taking: No sig reported) 28 patch 0   pantoprazole (PROTONIX) 40 MG tablet Take 40 mg by mouth 2 (two) times daily.     promethazine (PHENERGAN) 25 MG tablet Take 25 mg by mouth every 8 (eight) hours as needed for nausea or vomiting.     simvastatin (ZOCOR) 40 MG tablet Take 40 mg by mouth daily.     sodium chloride (OCEAN) 0.65 % SOLN nasal spray Place 2 sprays into both nostrils 3 (three) times daily as needed for congestion.      tiotropium (SPIRIVA) 18 MCG inhalation capsule Place 18 mcg into inhaler and inhale daily.     trazodone (DESYREL) 300 MG tablet Take 300 mg by mouth at bedtime.     varenicline (CHANTIX) 0.5 MG tablet Take 0.5 mg by mouth daily.     venlafaxine XR (EFFEXOR-XR) 150 MG 24 hr capsule Take 150 mg by mouth daily with breakfast.     No current facility-administered medications for this visit.    PHYSICAL EXAMINATION: ECOG PERFORMANCE STATUS: 1 - Symptomatic but completely  ambulatory  Vitals:   12/24/21 1409  BP: (!) 143/70  Pulse: 80  Resp: 16  Temp: 97.9 F (36.6 C)  SpO2: 92%   Filed Weights   12/24/21 1409  Weight: 222 lb 1.6 oz (100.7 kg)    LABORATORY DATA:  I have reviewed the data as listed CMP Latest Ref Rng & Units 12/24/2021 11/12/2021 10/23/2021  Glucose 70 - 99 mg/dL 102(H) 96 109(H)  BUN 8 - 23 mg/dL _0 Creatinine 0.44 - 1.00 mg/dL 1.01(H) 0.90 0.98  Sodium 135 - 145 mmol/L 139 139 138  Potassium 3.5 - 5.1 mmol/L 4.1 4.1 3.9  Chloride 98 - 111 mmol/L 101 103 104  CO2 22 - 32 mmol/L 34(H) 32 28  Calcium 8.9 - 10.3 mg/dL 9.1 8.9 8.9  Total Protein 6.5 - 8.1 g/dL 6.9 6.7 7.0  Total Bilirubin 0.3 - 1.2 mg/dL 0.4 0.4 0.5  Alkaline Phos 38 - 126 U/L 28(L) 31(L) 28(L)  AST 15 - 41 U/L 10(L) 10(L) 11(L)  ALT 0 - 44  U/L _0 Lab Results  Component Value Date   WBC 7.7 12/24/2021   HGB 14.5 12/24/2021   HCT 45.5 12/24/2021   MCV 91.2 12/24/2021   PLT 166 12/24/2021   NEUTROABS 4.1 12/24/2021    ASSESSMENT & PLAN:  Malignant neoplasm of upper-outer quadrant of left breast in female, estrogen receptor positive ([place]) 08/19/2021: Bilateral mastectomies Right mastectomy: Foci of intermediate to high-grade DCIS, sclerosing adenosis and complex sclerosing lesion, margins negative, 0/4 lymph nodes negative, ER 100%, PR 70% Left mastectomy: 2.5 cm grade 3 IDC with DCIS high-grade, margins negative, 0/4 lymph nodes negative ER 90%, PR 70%, HER2 positive, Ki-67 40% --------------------------------------------------------------------------------------------------------------------------------------------- Current treatment: Herceptin started 10/02/2021 (patient rejected chemotherapy) Herceptin toxicities: Patient felt achy after her last Herceptin treatment   Letrozole Toxicities:  Planned treatment duration is 7 years. Complains of hot flashes 4-5 times a day.  I instructed her to take letrozole at bedtime.   Continued chest  wall pain: She will apply CBD oil and see if it makes a difference.   Return to clinic every 3 weeks for Herceptin every 6 weeks for follow-up with me    No orders of the defined types were placed in this encounter.  The patient has a good understanding of the overall plan. she agrees with it. she will call with any problems that may develop before the next visit here.  Total time spent: 30 mins including face to face time and time spent for planning, charting and coordination of care  Rulon Eisenmenger, MD, MPH 12/24/2021  I, Thana Ates, am acting as scribe for Dr. [name].  I have reviewed the above documentation for accuracy and completeness, and I agree with the above.

## 2021-12-24 ENCOUNTER — Inpatient Hospital Stay: Payer: PPO

## 2021-12-24 ENCOUNTER — Other Ambulatory Visit: Payer: Self-pay

## 2021-12-24 ENCOUNTER — Inpatient Hospital Stay (HOSPITAL_BASED_OUTPATIENT_CLINIC_OR_DEPARTMENT_OTHER): Payer: PPO | Admitting: Hematology and Oncology

## 2021-12-24 DIAGNOSIS — C50412 Malignant neoplasm of upper-outer quadrant of left female breast: Secondary | ICD-10-CM | POA: Diagnosis not present

## 2021-12-24 DIAGNOSIS — Z170 Estrogen receptor positive status [ER+]: Secondary | ICD-10-CM | POA: Diagnosis not present

## 2021-12-24 DIAGNOSIS — C50911 Malignant neoplasm of unspecified site of right female breast: Secondary | ICD-10-CM

## 2021-12-24 DIAGNOSIS — Z5112 Encounter for antineoplastic immunotherapy: Secondary | ICD-10-CM | POA: Diagnosis not present

## 2021-12-24 DIAGNOSIS — Z95828 Presence of other vascular implants and grafts: Secondary | ICD-10-CM

## 2021-12-24 LAB — CBC WITH DIFFERENTIAL (CANCER CENTER ONLY)
Abs Immature Granulocytes: 0.01 10*3/uL (ref 0.00–0.07)
Basophils Absolute: 0 10*3/uL (ref 0.0–0.1)
Basophils Relative: 0 %
Eosinophils Absolute: 0.2 10*3/uL (ref 0.0–0.5)
Eosinophils Relative: 2 %
HCT: 45.5 % (ref 36.0–46.0)
Hemoglobin: 14.5 g/dL (ref 12.0–15.0)
Immature Granulocytes: 0 %
Lymphocytes Relative: 39 %
Lymphs Abs: 3 10*3/uL (ref 0.7–4.0)
MCH: 29.1 pg (ref 26.0–34.0)
MCHC: 31.9 g/dL (ref 30.0–36.0)
MCV: 91.2 fL (ref 80.0–100.0)
Monocytes Absolute: 0.4 10*3/uL (ref 0.1–1.0)
Monocytes Relative: 6 %
Neutro Abs: 4.1 10*3/uL (ref 1.7–7.7)
Neutrophils Relative %: 53 %
Platelet Count: 166 10*3/uL (ref 150–400)
RBC: 4.99 MIL/uL (ref 3.87–5.11)
RDW: 14.1 % (ref 11.5–15.5)
WBC Count: 7.7 10*3/uL (ref 4.0–10.5)
nRBC: 0 % (ref 0.0–0.2)

## 2021-12-24 LAB — CMP (CANCER CENTER ONLY)
ALT: 9 U/L (ref 0–44)
AST: 10 U/L — ABNORMAL LOW (ref 15–41)
Albumin: 3.9 g/dL (ref 3.5–5.0)
Alkaline Phosphatase: 28 U/L — ABNORMAL LOW (ref 38–126)
Anion gap: 4 — ABNORMAL LOW (ref 5–15)
BUN: 11 mg/dL (ref 8–23)
CO2: 34 mmol/L — ABNORMAL HIGH (ref 22–32)
Calcium: 9.1 mg/dL (ref 8.9–10.3)
Chloride: 101 mmol/L (ref 98–111)
Creatinine: 1.01 mg/dL — ABNORMAL HIGH (ref 0.44–1.00)
GFR, Estimated: >60 mL/min (ref >60)
Glucose, Bld: 102 mg/dL — ABNORMAL HIGH (ref 70–99)
Potassium: 4.1 mmol/L (ref 3.5–5.1)
Sodium: 139 mmol/L (ref 135–145)
Total Bilirubin: 0.4 mg/dL (ref 0.3–1.2)
Total Protein: 6.9 g/dL (ref 6.5–8.1)

## 2021-12-24 MED ORDER — ACETAMINOPHEN 325 MG PO TABS
650.0000 mg | ORAL_TABLET | Freq: Once | ORAL | Status: AC
  Administered 2021-12-24: 650 mg via ORAL
  Filled 2021-12-24: qty 2

## 2021-12-24 MED ORDER — SODIUM CHLORIDE 0.9% FLUSH
10.0000 mL | Freq: Once | INTRAVENOUS | Status: AC
  Administered 2021-12-24: 10 mL

## 2021-12-24 MED ORDER — HEPARIN SOD (PORK) LOCK FLUSH 100 UNIT/ML IV SOLN
500.0000 [IU] | Freq: Once | INTRAVENOUS | Status: AC | PRN
  Administered 2021-12-24: 500 [IU]

## 2021-12-24 MED ORDER — TRASTUZUMAB-DKST CHEMO 150 MG IV SOLR
6.0000 mg/kg | Freq: Once | INTRAVENOUS | Status: AC
  Administered 2021-12-24: 567 mg via INTRAVENOUS
  Filled 2021-12-24: qty 27

## 2021-12-24 MED ORDER — SODIUM CHLORIDE 0.9 % IV SOLN: Freq: Once | INTRAVENOUS | Status: AC

## 2021-12-24 MED ORDER — SODIUM CHLORIDE 0.9% FLUSH
10.0000 mL | INTRAVENOUS | Status: DC | PRN
  Administered 2021-12-24: 10 mL

## 2021-12-24 MED ORDER — DIPHENHYDRAMINE HCL 25 MG PO CAPS
25.0000 mg | ORAL_CAPSULE | Freq: Once | ORAL | Status: AC
  Administered 2021-12-24: 25 mg via ORAL
  Filled 2021-12-24: qty 1

## 2021-12-24 NOTE — Assessment & Plan Note (Signed)
08/19/2021: Bilateral mastectomies Right mastectomy: Foci of intermediate to high-grade DCIS, sclerosing adenosis and complex sclerosing lesion, margins negative, 0/4 lymph nodes negative, ER 100%, PR 70% Left mastectomy: 2.5 cm grade 3 IDC with DCIS high-grade, margins negative, 0/4 lymph nodes negative ER 90%, PR 70%, HER2 positive, Ki-67 40% --------------------------------------------------------------------------------------------------------------------------------------------- Current treatment: Herceptin started 10/02/2021 (patient rejected chemotherapy) Herceptin toxicities: Patient felt achy after her last Herceptin treatment  Letrozole Toxicities:  Planned treatment duration is 7 years.  Continued chest wall pain: Patient is out of pain medications and I discussed with her about the addiction potential of longstanding pain medication.  She will apply CBD oil and see if it makes a difference.  Return to clinic every 3 weeks for Herceptin every 6 weeks for follow-up with me

## 2021-12-24 NOTE — Patient Instructions (Signed)
[facility]   Discharge Instructions: Thank you for choosing [facility] to provide your oncology and hematology care.   If you have a lab appointment with the Cancer Center, please go directly to the Cancer Center and check in at the registration area.   Wear comfortable clothing and clothing appropriate for easy access to any Portacath or PICC line.   We strive to give you quality time with your provider. You may need to reschedule your appointment if you arrive late (15 or more minutes).  Arriving late affects you and other patients whose appointments are after yours.  Also, if you miss three or more appointments without notifying the office, you may be dismissed from the clinic at the provider's discretion.      For prescription refill requests, have your pharmacy contact our office and allow 72 hours for refills to be completed.    Today you received the following chemotherapy and/or immunotherapy agents: trastuzumab-dkst.      To help prevent nausea and vomiting after your treatment, we encourage you to take your nausea medication as directed.  BELOW ARE SYMPTOMS THAT SHOULD BE REPORTED IMMEDIATELY: *FEVER GREATER THAN 100.4 F (38 C) OR HIGHER *CHILLS OR SWEATING *NAUSEA AND VOMITING THAT IS NOT CONTROLLED WITH YOUR NAUSEA MEDICATION *UNUSUAL SHORTNESS OF BREATH *UNUSUAL BRUISING OR BLEEDING *URINARY PROBLEMS (pain or burning when urinating, or frequent urination) *BOWEL PROBLEMS (unusual diarrhea, constipation, pain near the anus) TENDERNESS IN MOUTH AND THROAT WITH OR WITHOUT PRESENCE OF ULCERS (sore throat, sores in mouth, or a toothache) UNUSUAL RASH, SWELLING OR PAIN  UNUSUAL VAGINAL DISCHARGE OR ITCHING   Items with * indicate a potential emergency and should be followed up as soon as possible or go to the Emergency Department if any problems should occur.  Please show the CHEMOTHERAPY ALERT CARD or IMMUNOTHERAPY ALERT CARD at  check-in to the Emergency Department and triage nurse.  Should you have questions after your visit or need to cancel or reschedule your appointment, please contact [facility]  Dept: [phone]  and follow the prompts.  Office hours are 8:00 a.m. to 4:30 p.m. Monday - Friday. Please note that voicemails left after 4:00 p.m. may not be returned until the following business day.  We are closed weekends and major holidays. You have access to a nurse at all times for urgent questions. Please call the main number to the clinic Dept: [phone] and follow the prompts.   For any non-urgent questions, you may also contact your provider using MyChart. We now offer e-Visits for anyone 18 and older to request care online for non-urgent symptoms. For details visit [email or web address].   Also download the MyChart app! Go to the app store, search "MyChart", open the app, select [facility], and log in with your MyChart username and password.  Due to Covid, a mask is required upon entering the hospital/clinic. If you do not have a mask, one will be given to you upon arrival. For doctor visits, patients may have 1 support person aged 18 or older with them. For treatment visits, patients cannot have anyone with them due to current Covid guidelines and our immunocompromised population.    

## 2021-12-30 ENCOUNTER — Ambulatory Visit: Payer: PPO | Admitting: Physical Therapy

## 2022-01-02 ENCOUNTER — Ambulatory Visit: Payer: PPO | Attending: General Surgery | Admitting: Physical Therapy

## 2022-01-02 ENCOUNTER — Other Ambulatory Visit: Payer: Self-pay

## 2022-01-02 ENCOUNTER — Encounter: Payer: Self-pay | Admitting: Physical Therapy

## 2022-01-02 DIAGNOSIS — R600 Localized edema: Secondary | ICD-10-CM | POA: Diagnosis present

## 2022-01-02 DIAGNOSIS — M25611 Stiffness of right shoulder, not elsewhere classified: Secondary | ICD-10-CM | POA: Diagnosis present

## 2022-01-02 DIAGNOSIS — M25612 Stiffness of left shoulder, not elsewhere classified: Secondary | ICD-10-CM | POA: Diagnosis present

## 2022-01-02 DIAGNOSIS — Z170 Estrogen receptor positive status [ER+]: Secondary | ICD-10-CM | POA: Insufficient documentation

## 2022-01-02 DIAGNOSIS — D0511 Intraductal carcinoma in situ of right breast: Secondary | ICD-10-CM | POA: Insufficient documentation

## 2022-01-02 DIAGNOSIS — C50412 Malignant neoplasm of upper-outer quadrant of left female breast: Secondary | ICD-10-CM | POA: Diagnosis present

## 2022-01-02 DIAGNOSIS — R293 Abnormal posture: Secondary | ICD-10-CM | POA: Diagnosis present

## 2022-01-02 NOTE — Therapy (Signed)
[facility] @ [address] [place], [place], [address] Phone: [phone]   Fax:  [phone]  Physical Therapy Treatment  Patient Details  Name: April [name] MRN: [id number] Date of Birth: 26-Oct-1956 Referring Provider (PT): Dr. Autumn Messing   Encounter Date: 01/02/2022   PT End of Session - 01/02/22 1303     Visit Number 11    Number of Visits 18    Date for PT Re-Evaluation 01/06/22    PT Start Time 1303    PT Stop Time 1340    PT Time Calculation (min) 37 min    Activity Tolerance Patient tolerated treatment well    Behavior During Therapy [facility] for tasks assessed/performed             Past Medical History:  Diagnosis Date   Anxiety    Arthritis    Cancer ([place])    COPD (chronic obstructive pulmonary disease) ([place])    Depression    GERD (gastroesophageal reflux disease)    Headache(784.0)    Hyperlipidemia    Hypertension    Incontinent of urine    Shortness of breath    Stroke [facility])     Past Surgical History:  Procedure Laterality Date   CAROTID STENT     Has known BICA occlusions since 2011; s/p left vertebral artery stent 12/26/09 & right VA stent 02/12/10   CHOLECYSTECTOMY     PORTACATH PLACEMENT Right 08/19/2021   Procedure: INSERTION PORT-A-CATH WITH ULTRASOUND;  Surgeon: [name], MD;  Location: [facility];  Service: General;  Laterality: Right;   ROTATOR CUFF REPAIR     SENTINEL NODE BIOPSY Bilateral 08/19/2021   Procedure: BILATERAL SENTINEL NODE BIOPSY;  Surgeon: [name], MD;  Location: [facility];  Service: General;  Laterality: Bilateral;   TOTAL MASTECTOMY Bilateral 08/19/2021   Procedure: BILATERAL TOTAL MASTECTOMY;  Surgeon: [name], MD;  Location: [facility];  Service: General;  Laterality: Bilateral;   TUBAL LIGATION      There were no vitals filed for this visit.   Subjective Assessment - 01/02/22 1303     Subjective I just finished antibiotics for a sinus infection. I had the R  side drained again. I can't tell if the swelling has come back.    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 The left breast mass measures 1.7 cm and is located in the upper outer quadrant. It is triple positive with a Ki67 of 40%. The right breast DCIS is ER/PR positive. 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 She smokes 1 pack/day; bilateral mastectomies on 08/19/21 R 0/4 nodes L 0/3 nodes, TIA, CHF, polycythemia    Patient Stated Goals to do what you want me to do and get out of here    Currently in Pain? No/denies    Pain Score 0-No pain                               [facility] Adult PT Treatment/Exercise - 01/02/22 0001       Shoulder Exercises: Pulleys   Flexion 2 minutes    ABduction 2 minutes      Manual Therapy   Manual Lymphatic Drainage (MLD) In Supine: Short neck, Rt inguinal and Rt axillo-inguinal anastomosisl, anterior R chest and continuing to work on  lateral trunk where pt's swelling is worse then retracing all steps                          PT Long Term Goals - 01/02/22 1347       PT LONG TERM GOAL #1   Title Patient will demonstrate she has regained full shoulder ROM and function post operatively compared to baselines.    Time 8    Period Weeks    Status Achieved      PT LONG TERM GOAL #2   Title Pt will not demonstrate any rippling edema across bilateral chest to decrease risk of infection.    Baseline 12/09/21- pt still has rippling edema on R side; 01/02/22- no rippling present but some slight fluid still palpable in R side    Time 4    Period Weeks    Status Achieved      PT LONG TERM GOAL #3   Title Pt will be independent in self MLD for long term management of edema    Baseline 12/09/21- pt does not feel independent with this, 01/02/22- pt feels independent with this    Time 4     Period Weeks    Status Achieved      PT LONG TERM GOAL #4   Title Pt will be independent in a home exercise program for continued stretching and strengthening    Time 4    Period Weeks    Status Achieved      PT LONG TERM GOAL #5   Title Pt will report 75% improvement in tightness and discomfort across medial chest to allow improved comfort.    Baseline 10/29/21- no improvement (pt has only been to 1 treatment session) 11/19/21- at least 50% improvement 12/09/21- pt reports this is 75% improved    Time 4    Period Weeks    Status Achieved                   Plan - 01/02/22 1348     Clinical Impression Statement Pt had seroma on R side drained again on 12/13/21. She reports the swelling has gone down and does not seem to be coming back. Therapist was able to palpable at small amount of swelling today but there was no rippling on the R side. Assessed pt's progress towards goals in therapy and currently pt has met all goals for therapy. She has an appointment to see her sugeon on 3/14. Will place pt on hold and see if the surgeon wants her to continue PT for the fluid that remains on the lateral R chest and if not she will be discharged from skilled PT services at that time.    PT Frequency 2x / week    PT Duration 4 weeks    PT Treatment/Interventions ADLs/Self Care Home Management;Therapeutic exercise;Patient/family education;Manual techniques;Manual lymph drainage;Compression bandaging;Passive range of motion;Scar mobilization    PT Next Visit Plan Cont MLD across bilateral chest and review with pt    PT Home Exercise Plan Post op HEP, self MLD    Consulted and Agree with Plan of Care Patient             Patient will benefit from skilled therapeutic intervention in order to improve the following deficits and impairments:  Postural dysfunction, Decreased range of motion, Decreased knowledge of precautions, Pain, Increased edema  Visit Diagnosis: Localized edema  Stiffness of  right shoulder, not elsewhere classified  Stiffness  of left shoulder, not elsewhere classified  Abnormal posture  Ductal carcinoma in situ (DCIS) of right breast  Malignant neoplasm of upper-outer quadrant of left breast in female, estrogen receptor positive ([place])     Problem List Patient Active Problem List   Diagnosis Date Noted   Port-A-Cath in place 10/02/2021   Bilateral breast cancer ([place]) 08/19/2021   Genetic testing 07/26/2021   Family history of breast cancer 07/10/2021   Family history of bone cancer 07/10/2021   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   Malignant neoplasm of upper-outer quadrant of left breast in female, estrogen receptor positive ([place]) 07/08/2021   Back pain 04/04/2016   Weakness    Confusion    Orthostatic dizziness    Left-sided weakness 11/10/2015   Polycythemia 11/10/2015   Chronic diastolic CHF (congestive heart failure) ([place]) 11/10/2015   Hypercarbia 11/10/2015   Respiratory acidosis 11/10/2015   Paresthesias in right hand 07/11/2015   Migraine without aura 01/30/2014   Occlusion of extracranial carotid artery 01/30/2014   Transient cerebral ischemia 01/30/2014   Headache(784.0) 01/04/2013   Dizziness 01/04/2013   TIA (transient ischemic attack) 01/04/2013   Migraine 01/04/2013   COPD (chronic obstructive pulmonary disease) ([place]) 01/04/2013    Allyson Sabal Patten, PT 01/02/2022, 2:06 PM  [facility] @ [address] [place], [place], [address] Phone: [phone]   Fax:  [phone]  Name: April [name] MRN: [id number] Date of Birth: Mar 17, 1956

## 2022-01-07 ENCOUNTER — Ambulatory Visit: Payer: PPO | Admitting: Physical Therapy

## 2022-01-09 ENCOUNTER — Encounter: Payer: PPO | Admitting: Physical Therapy

## 2022-01-13 ENCOUNTER — Encounter: Payer: PPO | Admitting: Physical Therapy

## 2022-01-14 ENCOUNTER — Inpatient Hospital Stay: Payer: PPO | Attending: Hematology and Oncology

## 2022-01-14 ENCOUNTER — Other Ambulatory Visit: Payer: Self-pay

## 2022-01-14 VITALS — BP 132/70 | HR 83 | Temp 98.2°F | Resp 14 | Ht 63.0 in | Wt 224.8 lb

## 2022-01-14 DIAGNOSIS — C50911 Malignant neoplasm of unspecified site of right female breast: Secondary | ICD-10-CM

## 2022-01-14 DIAGNOSIS — D0511 Intraductal carcinoma in situ of right breast: Secondary | ICD-10-CM | POA: Diagnosis not present

## 2022-01-14 DIAGNOSIS — Z170 Estrogen receptor positive status [ER+]: Secondary | ICD-10-CM | POA: Diagnosis not present

## 2022-01-14 DIAGNOSIS — Z5112 Encounter for antineoplastic immunotherapy: Secondary | ICD-10-CM | POA: Insufficient documentation

## 2022-01-14 DIAGNOSIS — C50412 Malignant neoplasm of upper-outer quadrant of left female breast: Secondary | ICD-10-CM | POA: Insufficient documentation

## 2022-01-14 MED ORDER — TRASTUZUMAB-DKST CHEMO 150 MG IV SOLR
6.0000 mg/kg | Freq: Once | INTRAVENOUS | Status: AC
  Administered 2022-01-14: 567 mg via INTRAVENOUS
  Filled 2022-01-14: qty 27

## 2022-01-14 MED ORDER — HEPARIN SOD (PORK) LOCK FLUSH 100 UNIT/ML IV SOLN
500.0000 [IU] | Freq: Once | INTRAVENOUS | Status: AC | PRN
  Administered 2022-01-14: 500 [IU]

## 2022-01-14 MED ORDER — DIPHENHYDRAMINE HCL 25 MG PO CAPS
25.0000 mg | ORAL_CAPSULE | Freq: Once | ORAL | Status: AC
  Administered 2022-01-14: 25 mg via ORAL
  Filled 2022-01-14: qty 1

## 2022-01-14 MED ORDER — SODIUM CHLORIDE 0.9% FLUSH
10.0000 mL | INTRAVENOUS | Status: DC | PRN
  Administered 2022-01-14: 10 mL

## 2022-01-14 MED ORDER — ACETAMINOPHEN 325 MG PO TABS
650.0000 mg | ORAL_TABLET | Freq: Once | ORAL | Status: AC
  Administered 2022-01-14: 650 mg via ORAL
  Filled 2022-01-14: qty 2

## 2022-01-14 MED ORDER — SODIUM CHLORIDE 0.9 % IV SOLN: Freq: Once | INTRAVENOUS | Status: AC

## 2022-01-14 NOTE — Patient Instructions (Signed)
[facility]  Discharge Instructions: Thank you for choosing [facility] to provide your oncology and hematology care.   If you have a lab appointment with the Cancer Center, please go directly to the Cancer Center and check in at the registration area.   Wear comfortable clothing and clothing appropriate for easy access to any Portacath or PICC line.   We strive to give you quality time with your provider. You may need to reschedule your appointment if you arrive late (15 or more minutes).  Arriving late affects you and other patients whose appointments are after yours.  Also, if you miss three or more appointments without notifying the office, you may be dismissed from the clinic at the provider's discretion.      For prescription refill requests, have your pharmacy contact our office and allow 72 hours for refills to be completed.    Today you received the following chemotherapy and/or immunotherapy agent: Trastuzumab ([place])   To help prevent nausea and vomiting after your treatment, we encourage you to take your nausea medication as directed.  BELOW ARE SYMPTOMS THAT SHOULD BE REPORTED IMMEDIATELY: *FEVER GREATER THAN 100.4 F (38 C) OR HIGHER *CHILLS OR SWEATING *NAUSEA AND VOMITING THAT IS NOT CONTROLLED WITH YOUR NAUSEA MEDICATION *UNUSUAL SHORTNESS OF BREATH *UNUSUAL BRUISING OR BLEEDING *URINARY PROBLEMS (pain or burning when urinating, or frequent urination) *BOWEL PROBLEMS (unusual diarrhea, constipation, pain near the anus) TENDERNESS IN MOUTH AND THROAT WITH OR WITHOUT PRESENCE OF ULCERS (sore throat, sores in mouth, or a toothache) UNUSUAL RASH, SWELLING OR PAIN  UNUSUAL VAGINAL DISCHARGE OR ITCHING   Items with * indicate a potential emergency and should be followed up as soon as possible or go to the Emergency Department if any problems should occur.  Please show the CHEMOTHERAPY ALERT CARD or IMMUNOTHERAPY ALERT CARD at  check-in to the Emergency Department and triage nurse.  Should you have questions after your visit or need to cancel or reschedule your appointment, please contact [facility]  Dept: [phone]  and follow the prompts.  Office hours are 8:00 a.m. to 4:30 p.m. Monday - Friday. Please note that voicemails left after 4:00 p.m. may not be returned until the following business day.  We are closed weekends and major holidays. You have access to a nurse at all times for urgent questions. Please call the main number to the clinic Dept: [phone] and follow the prompts.   For any non-urgent questions, you may also contact your provider using MyChart. We now offer e-Visits for anyone 18 and older to request care online for non-urgent symptoms. For details visit [email or web address].   Also download the MyChart app! Go to the app store, search "MyChart", open the app, select , and log in with your MyChart username and password.  Due to Covid, a mask is required upon entering the hospital/clinic. If you do not have a mask, one will be given to you upon arrival. For doctor visits, patients may have 1 support person aged 18 or older with them. For treatment visits, patients cannot have anyone with them due to current Covid guidelines and our immunocompromised population.  

## 2022-01-16 ENCOUNTER — Encounter: Payer: PPO | Admitting: Physical Therapy

## 2022-01-21 ENCOUNTER — Encounter: Payer: PPO | Admitting: Physical Therapy

## 2022-01-23 ENCOUNTER — Encounter: Payer: PPO | Admitting: Physical Therapy

## 2022-01-31 ENCOUNTER — Telehealth: Payer: Self-pay | Admitting: Hematology and Oncology

## 2022-01-31 NOTE — Telephone Encounter (Signed)
Called patient to inform her that she will be seeing Dr.Iruku and not Wilber Bihari due to Clarence having PAL. Left message. ?

## 2022-02-04 ENCOUNTER — Other Ambulatory Visit: Payer: Self-pay

## 2022-02-04 ENCOUNTER — Encounter: Payer: Self-pay | Admitting: Hematology and Oncology

## 2022-02-04 ENCOUNTER — Inpatient Hospital Stay: Payer: PPO | Attending: Hematology and Oncology

## 2022-02-04 ENCOUNTER — Inpatient Hospital Stay (HOSPITAL_BASED_OUTPATIENT_CLINIC_OR_DEPARTMENT_OTHER): Payer: PPO | Admitting: Hematology and Oncology

## 2022-02-04 ENCOUNTER — Inpatient Hospital Stay: Payer: PPO

## 2022-02-04 VITALS — BP 135/63 | HR 91 | Temp 97.2°F | Resp 18 | Ht 63.0 in | Wt 222.9 lb

## 2022-02-04 DIAGNOSIS — C50412 Malignant neoplasm of upper-outer quadrant of left female breast: Secondary | ICD-10-CM | POA: Diagnosis present

## 2022-02-04 DIAGNOSIS — Z170 Estrogen receptor positive status [ER+]: Secondary | ICD-10-CM | POA: Insufficient documentation

## 2022-02-04 DIAGNOSIS — Z5112 Encounter for antineoplastic immunotherapy: Secondary | ICD-10-CM | POA: Diagnosis present

## 2022-02-04 DIAGNOSIS — Z95828 Presence of other vascular implants and grafts: Secondary | ICD-10-CM

## 2022-02-04 DIAGNOSIS — D0511 Intraductal carcinoma in situ of right breast: Secondary | ICD-10-CM | POA: Diagnosis not present

## 2022-02-04 DIAGNOSIS — C50911 Malignant neoplasm of unspecified site of right female breast: Secondary | ICD-10-CM

## 2022-02-04 LAB — CBC WITH DIFFERENTIAL (CANCER CENTER ONLY)
Abs Immature Granulocytes: 0.03 10*3/uL (ref 0.00–0.07)
Basophils Absolute: 0 10*3/uL (ref 0.0–0.1)
Basophils Relative: 0 %
Eosinophils Absolute: 0.1 10*3/uL (ref 0.0–0.5)
Eosinophils Relative: 2 %
HCT: 47.5 % — ABNORMAL HIGH (ref 36.0–46.0)
Hemoglobin: 15.3 g/dL — ABNORMAL HIGH (ref 12.0–15.0)
Immature Granulocytes: 0 %
Lymphocytes Relative: 26 %
Lymphs Abs: 2.3 10*3/uL (ref 0.7–4.0)
MCH: 28.6 pg (ref 26.0–34.0)
MCHC: 32.2 g/dL (ref 30.0–36.0)
MCV: 88.8 fL (ref 80.0–100.0)
Monocytes Absolute: 0.5 10*3/uL (ref 0.1–1.0)
Monocytes Relative: 6 %
Neutro Abs: 5.9 10*3/uL (ref 1.7–7.7)
Neutrophils Relative %: 66 %
Platelet Count: 182 10*3/uL (ref 150–400)
RBC: 5.35 MIL/uL — ABNORMAL HIGH (ref 3.87–5.11)
RDW: 13.6 % (ref 11.5–15.5)
WBC Count: 8.9 10*3/uL (ref 4.0–10.5)
nRBC: 0 % (ref 0.0–0.2)

## 2022-02-04 LAB — CMP (CANCER CENTER ONLY)
ALT: 10 U/L (ref 0–44)
AST: 14 U/L — ABNORMAL LOW (ref 15–41)
Albumin: 3.6 g/dL (ref 3.5–5.0)
Alkaline Phosphatase: 25 U/L — ABNORMAL LOW (ref 38–126)
Anion gap: 5 (ref 5–15)
BUN: 13 mg/dL (ref 8–23)
CO2: 32 mmol/L (ref 22–32)
Calcium: 8.6 mg/dL — ABNORMAL LOW (ref 8.9–10.3)
Chloride: 103 mmol/L (ref 98–111)
Creatinine: 1.07 mg/dL — ABNORMAL HIGH (ref 0.44–1.00)
GFR, Estimated: 58 mL/min — ABNORMAL LOW (ref >60)
Glucose, Bld: 117 mg/dL — ABNORMAL HIGH (ref 70–99)
Potassium: 3.7 mmol/L (ref 3.5–5.1)
Sodium: 140 mmol/L (ref 135–145)
Total Bilirubin: 0.3 mg/dL (ref 0.3–1.2)
Total Protein: 7.1 g/dL (ref 6.5–8.1)

## 2022-02-04 MED ORDER — HEPARIN SOD (PORK) LOCK FLUSH 100 UNIT/ML IV SOLN
500.0000 [IU] | Freq: Once | INTRAVENOUS | Status: AC | PRN
  Administered 2022-02-04: 500 [IU]

## 2022-02-04 MED ORDER — SODIUM CHLORIDE 0.9% FLUSH
10.0000 mL | Freq: Once | INTRAVENOUS | Status: AC
  Administered 2022-02-04: 10 mL

## 2022-02-04 MED ORDER — SODIUM CHLORIDE 0.9% FLUSH
10.0000 mL | INTRAVENOUS | Status: DC | PRN
  Administered 2022-02-04: 10 mL

## 2022-02-04 MED ORDER — TRASTUZUMAB-DKST CHEMO 150 MG IV SOLR
6.0000 mg/kg | Freq: Once | INTRAVENOUS | Status: AC
  Administered 2022-02-04: 567 mg via INTRAVENOUS
  Filled 2022-02-04: qty 27

## 2022-02-04 MED ORDER — DIPHENHYDRAMINE HCL 25 MG PO CAPS
25.0000 mg | ORAL_CAPSULE | Freq: Once | ORAL | Status: AC
  Administered 2022-02-04: 25 mg via ORAL
  Filled 2022-02-04: qty 1

## 2022-02-04 MED ORDER — SODIUM CHLORIDE 0.9 % IV SOLN: Freq: Once | INTRAVENOUS | Status: AC

## 2022-02-04 MED ORDER — ACETAMINOPHEN 325 MG PO TABS
650.0000 mg | ORAL_TABLET | Freq: Once | ORAL | Status: AC
  Administered 2022-02-04: 650 mg via ORAL
  Filled 2022-02-04: qty 2

## 2022-02-04 NOTE — Progress Notes (Signed)
? ?Patient Care Team: ?[name], MD as PCP - General (Family Medicine) ?[name], RN as Oncology Nurse Navigator ?[name], RN as Oncology Nurse Navigator ?[name], MD as Consulting Physician (General Surgery) ?[name], MD as Consulting Physician (Hematology and Oncology) ?[name], MD as Consulting Physician (Radiation Oncology) ? ?DIAGNOSIS:  ?No diagnosis found. ? ? ?SUMMARY OF ONCOLOGIC HISTORY: ?Oncology History  ?Ductal carcinoma in situ (DCIS) of right breast  ?06/28/2021 Initial Diagnosis  ? Palpable mass in the left breast. Diagnostic mammogram and [place]: suspicious 1.7 cm mass in the left breast at the 1 o'clock position and 2 subtle areas of distortion in the upper outer right breast.  Left breast biopsy: Grade 3 IDC high grade focal DCIS Her2+/ER+(90%)/PR+(70%) in the left breast, and DCIS involving sclerosing adenosis with calcifications ER+(100%)/PR+(70%) in the right breast.  ?  ?07/08/2021 Initial Diagnosis  ? Ductal carcinoma in situ (DCIS) of right breast ?  ?08/19/2021 Surgery  ? Right mastectomy: Foci of intermediate to high-grade DCIS, sclerosing adenosis and complex sclerosing lesion, margins negative, 0/4 lymph nodes negative, ER 100%, PR 70% ?Left mastectomy: 2.5 cm grade 3 IDC with DCIS high-grade, margins negative, 0/4 lymph nodes negative ER 90%, PR 70%, HER2 positive, Ki-67 40% ?  ?Malignant neoplasm of upper-outer quadrant of left breast in female, estrogen receptor positive ([place])  ?06/28/2021 Initial Diagnosis  ? Palpable mass in the left breast. Diagnostic mammogram and [place]: suspicious 1.7 cm mass in the left breast at the 1 o'clock position and 2 subtle areas of distortion in the upper outer right breast.  Left breast biopsy: Grade 3 IDC high grade focal DCIS Her2+/ER+(90%)/PR+(70%) in the left breast, and DCIS involving sclerosing adenosis with calcifications ER+(100%)/PR+(70%) in the right breast.  ?  ?07/10/2021 Cancer Staging  ? Staging form: Breast,  AJCC 8th Edition ?- Clinical stage from 07/10/2021: Stage IA (cT1c, cN0, cM0, G3, ER+, PR+, HER2+) - Signed by [name], MD on 07/10/2021 ?Stage prefix: Initial diagnosis ?Histologic grading system: 3 grade system ? ?  ? Genetic Testing  ? [facility] CustomNext (47 gene) Panel was negative. No pathogenic variants were identified. The report date is 07/23/2021. ? ?The CustomNext-Cancer+RNAinsight panel offered by [facility] includes sequencing and rearrangement analysis for the following 47 genes:  APC, ATM, AXIN2, BARD1, BMPR1A, BRCA1, BRCA2, BRIP1, CDH1, CDK4, CDKN2A, CHEK2, DICER1, EPCAM, GREM1, HOXB13, MEN1, MLH1, MSH2, MSH3, MSH6, MUTYH, NBN, NF1, NF2, NTHL1, PALB2, PMS2, POLD1, POLE, PTEN, RAD51C, RAD51D, RECQL, RET, SDHA, SDHAF2, SDHB, SDHC, SDHD, SMAD4, SMARCA4, STK11, TP53, TSC1, TSC2, and VHL.  RNA data is routinely analyzed for use in variant interpretation for all genes. ?  ?Bilateral breast cancer ([place])  ?08/19/2021 Initial Diagnosis  ? Bilateral breast cancer [facility]) ?  ?10/02/2021 -  Chemotherapy  ? Patient is on Treatment Plan : BREAST Trastuzumab q21d x 13 cycles  ?   ? ? ?CHIEF COMPLIANT: Cycle 5 trastuzumab ? ?INTERVAL HISTORY: April [name] is a 66 y.o. with above-mentioned history of bilateral breast cancer having undergone bilateral mastectomies, currently on chemotherapy with trastuzumab.  She is tolerating Herceptin well, has some hot flashes with letrozole, tolerable.  No other concerns.  She recently saw her primary care doctor and was started on some pain medication and allergy medication.  She also felt a lump in the right inner thigh which was felt by her PCP and was not thought to be concerning.  She however wants [place] to examine this area today.  Rest of  the pertinent 10 point ROS reviewed and negative. ?ALLERGIES:  has No Known Allergies. ? ?MEDICATIONS:  ?Current Outpatient Medications  ?Medication Sig Dispense Refill  ? acetaminophen (TYLENOL) 500 MG tablet Take 1,000 mg by mouth every 8  (eight) hours as needed for moderate pain.    ? albuterol (VENTOLIN HFA) 108 (90 Base) MCG/ACT inhaler Inhale 2 puffs into the lungs every 6 (six) hours as needed for wheezing or shortness of breath.    ? aspirin-acetaminophen-caffeine (EXCEDRIN MIGRAINE) 250-250-65 MG per tablet Take 2 tablets by mouth every 6 (six) hours as needed for migraine.     ? clonazePAM (KLONOPIN) 0.5 MG tablet Take 0.5 mg by mouth in the morning, at noon, and at bedtime.  5  ? clopidogrel (PLAVIX) 75 MG tablet Take 75 mg by mouth daily.    ? fluticasone (FLONASE) 50 MCG/ACT nasal spray Place 2 sprays into both nostrils daily as needed for allergies or rhinitis.    ? letrozole (FEMARA) 2.5 MG tablet Take 1 tablet (2.5 mg total) by mouth daily. 90 tablet 3  ? lidocaine-prilocaine (EMLA) cream Apply to affected area once 30 g 3  ? methocarbamol (ROBAXIN) 500 MG tablet Take 1 tablet (500 mg total) by mouth every 6 (six) hours as needed for muscle spasms. 30 tablet 2  ? nicotine (NICODERM CQ - DOSED IN MG/24 HR) 7 mg/24hr patch Place 1 patch (7 mg total) onto the skin daily. (Patient not taking: No sig reported) 28 patch 0  ? pantoprazole (PROTONIX) 40 MG tablet Take 40 mg by mouth 2 (two) times daily.    ? promethazine (PHENERGAN) 25 MG tablet Take 25 mg by mouth every 8 (eight) hours as needed for nausea or vomiting.    ? simvastatin (ZOCOR) 40 MG tablet Take 40 mg by mouth daily.    ? sodium chloride (OCEAN) 0.65 % SOLN nasal spray Place 2 sprays into both nostrils 3 (three) times daily as needed for congestion.     ? tiotropium (SPIRIVA) 18 MCG inhalation capsule Place 18 mcg into inhaler and inhale daily.    ? trazodone (DESYREL) 300 MG tablet Take 300 mg by mouth at bedtime.    ? varenicline (CHANTIX) 0.5 MG tablet Take 0.5 mg by mouth daily.    ? venlafaxine XR (EFFEXOR-XR) 150 MG 24 hr capsule Take 150 mg by mouth daily with breakfast.    ? ?No current facility-administered medications for this visit.  ? ? ?PHYSICAL EXAMINATION: ?ECOG  PERFORMANCE STATUS: 1 - Symptomatic but completely ambulatory ? ?Vitals:  ? 02/04/22 1431  ?BP: 135/63  ?Pulse: 91  ?Resp: 18  ?Temp: (!) 97.2 ?F (36.2 ?C)  ?SpO2: 95%  ? ?Filed Weights  ? 02/04/22 1431  ?Weight: 222 lb 14.4 oz (101.1 kg)  ? ?Physical Exam ?Constitutional:   ?   Appearance: Normal appearance.  ?Cardiovascular:  ?   Rate and Rhythm: Normal rate and regular rhythm.  ?Pulmonary:  ?   Effort: Pulmonary effort is normal.  ?   Comments: Scattered bronchial breathing ?Musculoskeletal:     ?   General: No swelling or tenderness.  ?   Cervical back: Normal range of motion and neck supple. No rigidity.  ?Lymphadenopathy:  ?   Cervical: No cervical adenopathy.  ?Skin: ?   General: Skin is warm and dry.  ?   Comments: Lump in the right inner thigh did not quite feel pathologic or related to the breast cancer.  It does not have the consistency of her typical  lipoma either.  I encouraged her to watch it.  ?Neurological:  ?   Mental Status: She is alert.  ? ? ?LABORATORY DATA:  ?I have reviewed the data as listed ? ?  Latest Ref Rng & Units 12/24/2021  ?  1:51 PM 11/12/2021  ?  1:35 PM 10/23/2021  ?  2:32 PM  ?CMP  ?Glucose 70 - 99 mg/dL 102   96   109    ?BUN 8 - 23 mg/dL _0 ?Creatinine 0.44 - 1.00 mg/dL 1.01   0.90   0.98    ?Sodium 135 - 145 mmol/L 139   139   138    ?Potassium 3.5 - 5.1 mmol/L 4.1   4.1   3.9    ?Chloride 98 - 111 mmol/L 101   103   104    ?CO2 22 - 32 mmol/L 34   32   28    ?Calcium 8.9 - 10.3 mg/dL 9.1   8.9   8.9    ?Total Protein 6.5 - 8.1 g/dL 6.9   6.7   7.0    ?Total Bilirubin 0.3 - 1.2 mg/dL 0.4   0.4   0.5    ?Alkaline Phos 38 - 126 U/L _1 ?AST 15 - 41 U/L _2 ?ALT 0 - 44 U/L _3 ? ? ?Lab Results  ?Component Value Date  ? WBC 8.9 02/04/2022  ? HGB 15.3 (H) 02/04/2022  ? HCT 47.5 (H) 02/04/2022  ? MCV 88.8 02/04/2022  ? PLT 182 02/04/2022  ? NEUTROABS 5.9 02/04/2022  ? ? ?ASSESSMENT & PLAN:  ?Malignant neoplasm of upper-outer quadrant of  left breast in female, estrogen receptor positive ([place]) ?08/19/2021: Bilateral mastectomies ?Right mastectomy: Foci of intermediate to high-grade DCIS, sclerosing adenosis and complex sclerosing lesion,

## 2022-02-04 NOTE — Patient Instructions (Signed)
[facility]ICAL [facility]COLOG[facility]  Discharge Instructions: Thank you for choosing [facility] to provide your oncology and hematology care.   If you have a lab appointment with the Cancer Center, please go directly to the Cancer Center and check in at the registration area.   Wear comfortable clothing and clothing appropriate for easy access to any Portacath or PICC line.   We strive to give you quality time with your provider. You may need to reschedule your appointment if you arrive late (15 or more minutes).  Arriving late affects you and other patients whose appointments are after yours.  Also, if you miss three or more appointments without notifying the office, you may be dismissed from the clinic at the provider's discretion.      For prescription refill requests, have your pharmacy contact our office and allow 72 hours for refills to be completed.    Today you received the following chemotherapy and/or immunotherapy agent: Trastuzumab ([place])   To help prevent nausea and vomiting after your treatment, we encourage you to take your nausea medication as directed.  BELOW ARE SYMPTOMS THAT SHOULD BE REPORTED IMMEDIATELY: *FEVER GREATER THAN 100.4 F (38 C) OR HIGHER *CHILLS OR SWEATING *NAUSEA AND VOMITING THAT IS NOT CONTROLLED WITH YOUR NAUSEA MEDICATION *UNUSUAL SHORTNESS OF BREATH *UNUSUAL BRUISING OR BLEEDING *URINARY PROBLEMS (pain or burning when urinating, or frequent urination) *BOWEL PROBLEMS (unusual diarrhea, constipation, pain near the anus) TENDERNESS IN MOUTH AND THROAT WITH OR WITHOUT PRESENCE OF ULCERS (sore throat, sores in mouth, or a toothache) UNUSUAL RASH, SWELLING OR PAIN  UNUSUAL VAGINAL DISCHARGE OR ITCHING   Items with * indicate a potential emergency and should be followed up as soon as possible or go to the Emergency Department if any problems should occur.  Please show the CHEMOTHERAPY ALERT CARD or IMMUNOTHERAPY ALERT CARD at  check-in to the Emergency Department and triage nurse.  Should you have questions after your visit or need to cancel or reschedule your appointment, please contact [facility]  Dept: [phone]  and follow the prompts.  Office hours are 8:00 a.m. to 4:30 p.m. Monday - Friday. Please note that voicemails left after 4:00 p.m. may not be returned until the following business day.  We are closed weekends and major holidays. You have access to a nurse at all times for urgent questions. Please call the main number to the clinic Dept: [phone] and follow the prompts.   For any non-urgent questions, you may also contact your provider using MyChart. We now offer e-Visits for anyone 18 and older to request care online for non-urgent symptoms. For details visit [email or web address].   Also download the MyChart app! Go to the app store, search "MyChart", open the app, select [facility], and log in with your MyChart username and password.  Due to Covid, a mask is required upon entering the hospital/clinic. If you do not have a mask, one will be given to you upon arrival. For doctor visits, patients may have 1 support person aged 18 or older with them. For treatment visits, patients cannot have anyone with them due to current Covid guidelines and our immunocompromised population.  

## 2022-02-07 ENCOUNTER — Other Ambulatory Visit: Payer: Self-pay | Admitting: *Deleted

## 2022-02-09 DIAGNOSIS — J302 Other seasonal allergic rhinitis: Secondary | ICD-10-CM | POA: Insufficient documentation

## 2022-02-10 ENCOUNTER — Other Ambulatory Visit (HOSPITAL_COMMUNITY): Payer: PPO

## 2022-02-14 ENCOUNTER — Other Ambulatory Visit (HOSPITAL_COMMUNITY): Payer: PPO

## 2022-02-25 ENCOUNTER — Inpatient Hospital Stay: Payer: PPO | Attending: Hematology and Oncology

## 2022-02-25 ENCOUNTER — Encounter: Payer: Self-pay | Admitting: *Deleted

## 2022-02-25 ENCOUNTER — Other Ambulatory Visit: Payer: Self-pay

## 2022-02-25 VITALS — BP 149/81 | HR 87 | Temp 98.5°F | Resp 18 | Ht 63.0 in | Wt 222.8 lb

## 2022-02-25 DIAGNOSIS — D0511 Intraductal carcinoma in situ of right breast: Secondary | ICD-10-CM | POA: Insufficient documentation

## 2022-02-25 DIAGNOSIS — Z170 Estrogen receptor positive status [ER+]: Secondary | ICD-10-CM | POA: Insufficient documentation

## 2022-02-25 DIAGNOSIS — Z5112 Encounter for antineoplastic immunotherapy: Secondary | ICD-10-CM | POA: Diagnosis present

## 2022-02-25 DIAGNOSIS — C50911 Malignant neoplasm of unspecified site of right female breast: Secondary | ICD-10-CM

## 2022-02-25 DIAGNOSIS — C50412 Malignant neoplasm of upper-outer quadrant of left female breast: Secondary | ICD-10-CM | POA: Insufficient documentation

## 2022-02-25 MED ORDER — TRASTUZUMAB-DKST CHEMO 150 MG IV SOLR
6.0000 mg/kg | Freq: Once | INTRAVENOUS | Status: AC
  Administered 2022-02-25: 567 mg via INTRAVENOUS
  Filled 2022-02-25: qty 27

## 2022-02-25 MED ORDER — SODIUM CHLORIDE 0.9 % IV SOLN: Freq: Once | INTRAVENOUS | Status: AC

## 2022-02-25 MED ORDER — DIPHENHYDRAMINE HCL 25 MG PO CAPS
25.0000 mg | ORAL_CAPSULE | Freq: Once | ORAL | Status: AC
  Administered 2022-02-25: 25 mg via ORAL
  Filled 2022-02-25: qty 1

## 2022-02-25 MED ORDER — HEPARIN SOD (PORK) LOCK FLUSH 100 UNIT/ML IV SOLN
500.0000 [IU] | Freq: Once | INTRAVENOUS | Status: AC | PRN
  Administered 2022-02-25: 500 [IU]

## 2022-02-25 MED ORDER — SODIUM CHLORIDE 0.9% FLUSH
10.0000 mL | INTRAVENOUS | Status: DC | PRN
  Administered 2022-02-25: 10 mL

## 2022-02-25 MED ORDER — ACETAMINOPHEN 325 MG PO TABS
650.0000 mg | ORAL_TABLET | Freq: Once | ORAL | Status: AC
  Administered 2022-02-25: 650 mg via ORAL
  Filled 2022-02-25: qty 2

## 2022-02-25 NOTE — Patient Instructions (Signed)
[facility]COL[facility]  Discharge Instructions: Thank you for choosing [facility] to provide your oncology and hematology care.   If you have a lab appointment with the Cancer Center, please go directly to the Cancer Center and check in at the registration area.   Wear comfortable clothing and clothing appropriate for easy access to any Portacath or PICC line.   We strive to give you quality time with your provider. You may need to reschedule your appointment if you arrive late (15 or more minutes).  Arriving late affects you and other patients whose appointments are after yours.  Also, if you miss three or more appointments without notifying the office, you may be dismissed from the clinic at the provider's discretion.      For prescription refill requests, have your pharmacy contact our office and allow 72 hours for refills to be completed.    Today you received the following chemotherapy and/or immunotherapy agent: Trastuzumab ([place]givri)   To help prevent nausea and vomiting after your treatment, we encourage you to take your nausea medication as directed.  BELOW ARE SYMPTOMS THAT SHOULD BE REPORTED IMMEDIATELY: *FEVER GREATER THAN 100.4 F (38 C) OR HIGHER *CHILLS OR SWEATING *NAUSEA AND VOMITING THAT IS NOT CONTROLLED WITH YOUR NAUSEA MEDICATION *UNUSUAL SHORTNESS OF BREATH *UNUSUAL BRUISING OR BLEEDING *URINARY PROBLEMS (pain or burning when urinating, or frequent urination) *BOWEL PROBLEMS (unusual diarrhea, constipation, pain near the anus) TENDERNESS IN MOUTH AND THROAT WITH OR WITHOUT PRESENCE OF ULCERS (sore throat, sores in mouth, or a toothache) UNUSUAL RASH, SWELLING OR PAIN  UNUSUAL VAGINAL DISCHARGE OR ITCHING   Items with * indicate a potential emergency and should be followed up as soon as possible or go to the Emergency Department if any problems should occur.  Please show the CHEMOTHERAPY ALERT CARD or IMMUNOTHERAPY ALERT CARD at  check-in to the Emergency Department and triage nurse.  Should you have questions after your visit or need to cancel or reschedule your appointment, please contact [facility]  Dept: [phone]  and follow the prompts.  Office hours are 8:00 a.m. to 4:30 p.m. Monday - Friday. Please note that voicemails left after 4:00 p.m. may not be returned until the following business day.  We are closed weekends and major holidays. You have access to a nurse at all times for urgent questions. Please call the main number to the clinic Dept: [phone] and follow the prompts.   For any non-urgent questions, you may also contact your provider using MyChart. We now offer e-Visits for anyone 18 and older to request care online for non-urgent symptoms. For details visit [email or web address].   Also download the MyChart app! Go to the app store, search "MyChart", open the app, select [facility], and log in with your MyChart username and password.  Due to Covid, a mask is required upon entering the hospital/clinic. If you do not have a mask, one will be given to you upon arrival. For doctor visits, patients may have 1 support person aged 18 or older with them. For treatment visits, patients cannot have anyone with them due to current Covid guidelines and our immunocompromised population.  

## 2022-02-26 ENCOUNTER — Other Ambulatory Visit (HOSPITAL_COMMUNITY): Payer: PPO

## 2022-02-28 ENCOUNTER — Inpatient Hospital Stay (HOSPITAL_COMMUNITY): Admission: RE | Admit: 2022-02-28 | Payer: PPO | Source: Ambulatory Visit

## 2022-03-03 ENCOUNTER — Ambulatory Visit: Payer: PPO | Attending: General Surgery

## 2022-03-03 VITALS — Wt 227.2 lb

## 2022-03-03 DIAGNOSIS — Z483 Aftercare following surgery for neoplasm: Secondary | ICD-10-CM | POA: Insufficient documentation

## 2022-03-03 NOTE — Therapy (Signed)
?OUTPATIENT PHYSICAL THERAPY SOZO SCREENING NOTE ? ? ?Patient Name: April [name] ?MRN: [id number] ?DOB:12-Jun-1956, 66 y.o., female ?Today's Date: 03/03/2022 ? ?PCP: [name], MD ?REFERRING PROVIDER: [name], MD ? ? PT End of Session - 03/03/22 1523   ? ? Visit Number 11   # unchanged due to screen only  ? PT Start Time 1520   ? PT Stop Time 1525   ? PT Time Calculation (min) 5 min   ? Activity Tolerance Patient tolerated treatment well   ? Behavior During Therapy [facility] for tasks assessed/performed   ? ?  ?  ? ?  ? ? ?Past Medical History:  ?Diagnosis Date  ? Anxiety   ? Arthritis   ? Cancer [facility])   ? COPD (chronic obstructive pulmonary disease) ([place])   ? Depression   ? GERD (gastroesophageal reflux disease)   ? Headache(784.0)   ? Hyperlipidemia   ? Hypertension   ? Incontinent of urine   ? Shortness of breath   ? Stroke [facility])   ? ?Past Surgical History:  ?Procedure Laterality Date  ? CAROTID STENT    ? Has known BICA occlusions since 2011; s/p left vertebral artery stent 12/26/09 & right VA stent 02/12/10  ? CHOLECYSTECTOMY    ? PORTACATH PLACEMENT Right 08/19/2021  ? Procedure: INSERTION PORT-A-CATH WITH ULTRASOUND;  Surgeon: [name], MD;  Location: [place];  Service: General;  Laterality: Right;  ? ROTATOR CUFF REPAIR    ? SENTINEL NODE BIOPSY Bilateral 08/19/2021  ? Procedure: BILATERAL SENTINEL NODE BIOPSY;  Surgeon: [name], MD;  Location: [place];  Service: General;  Laterality: Bilateral;  ? TOTAL MASTECTOMY Bilateral 08/19/2021  ? Procedure: BILATERAL TOTAL MASTECTOMY;  Surgeon: [name], MD;  Location: [place];  Service: General;  Laterality: Bilateral;  ? TUBAL LIGATION    ? ?Patient Active Problem List  ? Diagnosis Date Noted  ? Port-A-Cath in place 10/02/2021  ? Bilateral breast cancer ([place]) 08/19/2021  ? Genetic testing 07/26/2021  ? Family history of breast cancer 07/10/2021  ? Family history of bone cancer 07/10/2021  ? 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   ? Malignant neoplasm of upper-outer quadrant of left breast in female, estrogen receptor positive ([place]) 07/08/2021  ? Back pain 04/04/2016  ? Weakness   ? Confusion   ? Orthostatic dizziness   ? Left-sided weakness 11/10/2015  ? Polycythemia 11/10/2015  ? Chronic diastolic CHF (congestive heart failure) ([place]) 11/10/2015  ? Hypercarbia 11/10/2015  ? Respiratory acidosis 11/10/2015  ? Paresthesias in right hand 07/11/2015  ? Migraine without aura 01/30/2014  ? 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 Transient cerebral ischemia 01/30/2014  ? Headache(784.0) 01/04/2013  ? Dizziness 01/04/2013  ? TIA (transient ischemic attack) 01/04/2013  ? Migraine 01/04/2013  ? COPD (chronic obstructive pulmonary disease) ([place]) 01/04/2013  ? ? ?REFERRING DIAG: bilateral breast cancer at risk for lymphedema ? ?THERAPY DIAG:  ?Aftercare following surgery for neoplasm ? ?PERTINENT HISTORY: Patient was diagnosed on 06/19/2021 with left grade III invasive ductal carcinoma breast cancer and right DCIS breast cancer. The left breast mass measures 1.7 cm and is located in the upper outer quadrant. It is triple positive with a Ki67 of 40%. The right breast DCIS is ER/PR positive. 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 She smokes 1 pack/day; bilateral mastectomies on 08/19/21 R 0/4 nodes L 0/3 nodes, TIA, CHF, polycythemia  ? ?  PRECAUTIONS: bilateral UE Lymphedema risk, None ? ?SUBJECTIVE: Pt returns for her 3 month L-Dex screen.  ? ?PAIN:  ?Are you having pain? No ? ?SOZO SCREENING: ?Patient was assessed today using the SOZO machine to determine the lymphedema index score. This was compared to her baseline score. It was determined that she is within the recommended range when compared to her baseline and no further action is needed at this time. She will continue SOZO screenings. These are done every 3 months for 2 years post operatively followed by every 6 months for 2 years,  and then annually. ? ? ? ?Otelia Limes, PTA ?03/03/2022, 3:27 PM ? ?  ? ?

## 2022-03-11 NOTE — Progress Notes (Signed)
Patient Care Team: [name], MD as PCP - General (Family Medicine) [name], RN as Oncology Nurse Navigator [name], RN as Oncology Nurse Navigator [name], MD as Consulting Physician (General Surgery) [name], MD as Consulting Physician (Hematology and Oncology) [name], MD as Consulting Physician (Radiation Oncology)  DIAGNOSIS:  Encounter Diagnosis  Name Primary?   Malignant neoplasm of upper-outer quadrant of left breast in female, estrogen receptor positive ([place])     SUMMARY OF ONCOLOGIC HISTORY: Oncology History  Ductal carcinoma in situ (DCIS) of right breast  06/28/2021 Initial Diagnosis   Palpable mass in the left breast. Diagnostic mammogram and [place]: suspicious 1.7 cm mass in the left breast at the 1 o'clock position and 2 subtle areas of distortion in the upper outer right breast.  Left breast biopsy: Grade 3 IDC high grade focal DCIS Her2+/ER+(90%)/PR+(70%) in the left breast, and DCIS involving sclerosing adenosis with calcifications ER+(100%)/PR+(70%) in the right breast.    07/08/2021 Initial Diagnosis   Ductal carcinoma in situ (DCIS) of right breast   08/19/2021 Surgery   Right mastectomy: Foci of intermediate to high-grade DCIS, sclerosing adenosis and complex sclerosing lesion, margins negative, 0/4 lymph nodes negative, ER 100%, PR 70% Left mastectomy: 2.5 cm grade 3 IDC with DCIS high-grade, margins negative, 0/4 lymph nodes negative ER 90%, PR 70%, HER2 positive, Ki-67 40%   Malignant neoplasm of upper-outer quadrant of left breast in female, estrogen receptor positive ([place])  06/28/2021 Initial Diagnosis   Palpable mass in the left breast. Diagnostic mammogram and [place]: suspicious 1.7 cm mass in the left breast at the 1 o'clock position and 2 subtle areas of distortion in the upper outer right breast.  Left breast biopsy: Grade 3 IDC high grade focal DCIS Her2+/ER+(90%)/PR+(70%) in the left breast, and DCIS involving sclerosing  adenosis with calcifications ER+(100%)/PR+(70%) in the right breast.    07/10/2021 Cancer Staging   Staging form: Breast, AJCC 8th Edition - Clinical stage from 07/10/2021: Stage IA (cT1c, cN0, cM0, G3, ER+, PR+, HER2+) - Signed by [name], MD on 07/10/2021 Stage prefix: Initial diagnosis Histologic grading system: 3 grade system     Genetic Testing   [facility] CustomNext (47 gene) Panel was negative. No pathogenic variants were identified. The report date is 07/23/2021.  The CustomNext-Cancer+RNAinsight panel offered by [facility] includes sequencing and rearrangement analysis for the following 47 genes:  APC, ATM, AXIN2, BARD1, BMPR1A, BRCA1, BRCA2, BRIP1, CDH1, CDK4, CDKN2A, CHEK2, DICER1, EPCAM, GREM1, HOXB13, MEN1, MLH1, MSH2, MSH3, MSH6, MUTYH, NBN, NF1, NF2, NTHL1, PALB2, PMS2, POLD1, POLE, PTEN, RAD51C, RAD51D, RECQL, RET, SDHA, SDHAF2, SDHB, SDHC, SDHD, SMAD4, SMARCA4, STK11, TP53, TSC1, TSC2, and VHL.  RNA data is routinely analyzed for use in variant interpretation for all genes.   Bilateral breast cancer ([place])  08/19/2021 Initial Diagnosis   Bilateral breast cancer ([place])    10/02/2021 -  Chemotherapy   Patient is on Treatment Plan : BREAST Trastuzumab q21d x 13 cycles        CHIEF COMPLIANT: Herceptin follow-up    INTERVAL HISTORY: April [name] is a 67 y.o. with above-mentioned history of bilateral breast cancer having undergone bilateral mastectomies, currently on chemotherapy with trastuzumab. She presents to the clinic today for treatment. States that her bowels is under control. States that she is having hot flashes, states that it wakes her up at night but it's manageable. States that she has some fatigue but it's normal. States that she has dry skin.  ALLERGIES:  has No Known Allergies.  MEDICATIONS:  Current Outpatient Medications  Medication Sig Dispense Refill   acetaminophen (TYLENOL) 500 MG tablet Take 1,000 mg by mouth every 8 (eight) hours as needed for  moderate pain.     albuterol (VENTOLIN HFA) 108 (90 Base) MCG/ACT inhaler Inhale 2 puffs into the lungs every 6 (six) hours as needed for wheezing or shortness of breath.     aspirin-acetaminophen-caffeine (EXCEDRIN MIGRAINE) 250-250-65 MG per tablet Take 2 tablets by mouth every 6 (six) hours as needed for migraine.      clonazePAM (KLONOPIN) 0.5 MG tablet Take 0.5 mg by mouth in the morning, at noon, and at bedtime.  5   clopidogrel (PLAVIX) 75 MG tablet Take 75 mg by mouth daily.     fluticasone (FLONASE) 50 MCG/ACT nasal spray Place 2 sprays into both nostrils daily as needed for allergies or rhinitis.     letrozole (FEMARA) 2.5 MG tablet Take 1 tablet (2.5 mg total) by mouth daily. 90 tablet 3   lidocaine-prilocaine (EMLA) cream Apply to affected area once 30 g 3   methocarbamol (ROBAXIN) 500 MG tablet Take 1 tablet (500 mg total) by mouth every 6 (six) hours as needed for muscle spasms. 30 tablet 2   nicotine (NICODERM CQ - DOSED IN MG/24 HR) 7 mg/24hr patch Place 1 patch (7 mg total) onto the skin daily. (Patient not taking: No sig reported) 28 patch 0   pantoprazole (PROTONIX) 40 MG tablet Take 40 mg by mouth 2 (two) times daily.     promethazine (PHENERGAN) 25 MG tablet Take 25 mg by mouth every 8 (eight) hours as needed for nausea or vomiting.     simvastatin (ZOCOR) 40 MG tablet Take 40 mg by mouth daily.     sodium chloride (OCEAN) 0.65 % SOLN nasal spray Place 2 sprays into both nostrils 3 (three) times daily as needed for congestion.      tiotropium (SPIRIVA) 18 MCG inhalation capsule Place 18 mcg into inhaler and inhale daily.     trazodone (DESYREL) 300 MG tablet Take 300 mg by mouth at bedtime.     varenicline (CHANTIX) 0.5 MG tablet Take 0.5 mg by mouth daily.     venlafaxine XR (EFFEXOR-XR) 150 MG 24 hr capsule Take 150 mg by mouth daily with breakfast.     No current facility-administered medications for this visit.    PHYSICAL EXAMINATION: ECOG PERFORMANCE STATUS: 1 -  Symptomatic but completely ambulatory  Vitals:   03/19/22 1419  BP: (!) 148/86  Pulse: 88  Resp: 18  Temp: (!) 97.2 F (36.2 C)  SpO2: 96%   Filed Weights   03/19/22 1419  Weight: 227 lb (103 kg)      LABORATORY DATA:  I have reviewed the data as listed    Latest Ref Rng & Units 02/04/2022    4:15 PM 12/24/2021    1:51 PM 11/12/2021    1:35 PM  CMP  Glucose 70 - 99 mg/dL 117   102   96    BUN 8 - 23 mg/dL _0 Creatinine 0.44 - 1.00 mg/dL 1.07   1.01   0.90    Sodium 135 - 145 mmol/L 140   139   139    Potassium 3.5 - 5.1 mmol/L 3.7   4.1   4.1    Chloride 98 - 111 mmol/L 103   101   103    CO2 22 -  32 mmol/L 32   34   32    Calcium 8.9 - 10.3 mg/dL 8.6   9.1   8.9    Total Protein 6.5 - 8.1 g/dL 7.1   6.9   6.7    Total Bilirubin 0.3 - 1.2 mg/dL 0.3   0.4   0.4    Alkaline Phos 38 - 126 U/L _0 AST 15 - 41 U/L _1 ALT 0 - 44 U/L _2 Lab Results  Component Value Date   WBC 8.9 02/04/2022   HGB 15.3 (H) 02/04/2022   HCT 47.5 (H) 02/04/2022   MCV 88.8 02/04/2022   PLT 182 02/04/2022   NEUTROABS 5.9 02/04/2022    ASSESSMENT & PLAN:  Malignant neoplasm of upper-outer quadrant of left breast in female, estrogen receptor positive ([place]) 08/19/2021: Bilateral mastectomies Right mastectomy: Foci of intermediate to high-grade DCIS, sclerosing adenosis and complex sclerosing lesion, margins negative, 0/4 lymph nodes negative, ER 100%, PR 70% Left mastectomy: 2.5 cm grade 3 IDC with DCIS high-grade, margins negative, 0/4 lymph nodes negative ER 90%, PR 70%, HER2 positive, Ki-67 40% --------------------------------------------------------------------------------------------------------------------------------------------- Current treatment: Herceptin started 10/02/2021 (patient rejected chemotherapy) Herceptin toxicities: None.   Letrozole Toxicities:  Planned treatment duration is 7 years. Complains of hot flashes 4-5 times  a day.  I instructed her to take letrozole at bedtime. Dryness of the skin: Bothering her   Lump in the right thigh inner surface, doesn't feel pathologic, doesn't entirely feel like lipoma either. She said there is no change in size in the past 2 to 3 months.  I encouraged her to keep an eye on it and let us know if it starts growing. Echo 03/13/2022: EF 60 to 65%  Return to clinic every 3 weeks for Herceptin every 6 weeks for follow-up with me    No orders of the defined types were placed in this encounter.  The patient has a good understanding of the overall plan. she agrees with it. she will call with any problems that may develop before the next visit here. Total time spent: 30 mins including face to face time and time spent for planning, charting and co-ordination of care   Harriette Ohara, MD 03/19/22    I Gardiner Coins am scribing for Dr. Lindi Adie  I have reviewed the above documentation for accuracy and completeness, and I agree with the above.

## 2022-03-13 ENCOUNTER — Ambulatory Visit (HOSPITAL_COMMUNITY)
Admission: RE | Admit: 2022-03-13 | Discharge: 2022-03-13 | Disposition: A | Discharge status: Home / Self Care | Payer: PPO | Source: Ambulatory Visit | Attending: Hematology and Oncology | Admitting: Hematology and Oncology

## 2022-03-13 DIAGNOSIS — J449 Chronic obstructive pulmonary disease, unspecified: Secondary | ICD-10-CM | POA: Insufficient documentation

## 2022-03-13 DIAGNOSIS — E785 Hyperlipidemia, unspecified: Secondary | ICD-10-CM | POA: Diagnosis not present

## 2022-03-13 DIAGNOSIS — Z170 Estrogen receptor positive status [ER+]: Secondary | ICD-10-CM | POA: Diagnosis not present

## 2022-03-13 DIAGNOSIS — G459 Transient cerebral ischemic attack, unspecified: Secondary | ICD-10-CM | POA: Insufficient documentation

## 2022-03-13 DIAGNOSIS — E119 Type 2 diabetes mellitus without complications: Secondary | ICD-10-CM | POA: Insufficient documentation

## 2022-03-13 DIAGNOSIS — Z0189 Encounter for other specified special examinations: Secondary | ICD-10-CM

## 2022-03-13 DIAGNOSIS — C50412 Malignant neoplasm of upper-outer quadrant of left female breast: Secondary | ICD-10-CM | POA: Diagnosis not present

## 2022-03-13 DIAGNOSIS — I3139 Other pericardial effusion (noninflammatory): Secondary | ICD-10-CM | POA: Diagnosis not present

## 2022-03-13 DIAGNOSIS — I10 Essential (primary) hypertension: Secondary | ICD-10-CM | POA: Diagnosis not present

## 2022-03-13 DIAGNOSIS — R0602 Shortness of breath: Secondary | ICD-10-CM | POA: Insufficient documentation

## 2022-03-13 DIAGNOSIS — Z5111 Encounter for antineoplastic chemotherapy: Secondary | ICD-10-CM | POA: Insufficient documentation

## 2022-03-13 LAB — ECHOCARDIOGRAM COMPLETE
AR max vel: 2.69 cm2
AV Area VTI: 2.71 cm2
AV Area mean vel: 2.71 cm2
AV Mean grad: 4 mmHg
AV Peak grad: 7.6 mmHg
Ao pk vel: 1.38 m/s
Area-P 1/2: 3.66 cm2
Calc EF: 67.2 %
S' Lateral: 3.5 cm
Single Plane A2C EF: 68.9 %
Single Plane A4C EF: 65.9 %

## 2022-03-19 ENCOUNTER — Other Ambulatory Visit: Payer: Self-pay

## 2022-03-19 ENCOUNTER — Inpatient Hospital Stay (HOSPITAL_BASED_OUTPATIENT_CLINIC_OR_DEPARTMENT_OTHER): Payer: PPO | Admitting: Hematology and Oncology

## 2022-03-19 ENCOUNTER — Inpatient Hospital Stay: Payer: PPO

## 2022-03-19 DIAGNOSIS — Z95828 Presence of other vascular implants and grafts: Secondary | ICD-10-CM

## 2022-03-19 DIAGNOSIS — Z170 Estrogen receptor positive status [ER+]: Secondary | ICD-10-CM | POA: Diagnosis not present

## 2022-03-19 DIAGNOSIS — C50412 Malignant neoplasm of upper-outer quadrant of left female breast: Secondary | ICD-10-CM

## 2022-03-19 DIAGNOSIS — C50911 Malignant neoplasm of unspecified site of right female breast: Secondary | ICD-10-CM

## 2022-03-19 DIAGNOSIS — Z5112 Encounter for antineoplastic immunotherapy: Secondary | ICD-10-CM | POA: Diagnosis not present

## 2022-03-19 LAB — CBC WITH DIFFERENTIAL (CANCER CENTER ONLY)
Abs Immature Granulocytes: 0.04 10*3/uL (ref 0.00–0.07)
Basophils Absolute: 0 10*3/uL (ref 0.0–0.1)
Basophils Relative: 0 %
Eosinophils Absolute: 0.2 10*3/uL (ref 0.0–0.5)
Eosinophils Relative: 2 %
HCT: 48.4 % — ABNORMAL HIGH (ref 36.0–46.0)
Hemoglobin: 15.4 g/dL — ABNORMAL HIGH (ref 12.0–15.0)
Immature Granulocytes: 0 %
Lymphocytes Relative: 31 %
Lymphs Abs: 2.9 10*3/uL (ref 0.7–4.0)
MCH: 27.8 pg (ref 26.0–34.0)
MCHC: 31.8 g/dL (ref 30.0–36.0)
MCV: 87.5 fL (ref 80.0–100.0)
Monocytes Absolute: 0.6 10*3/uL (ref 0.1–1.0)
Monocytes Relative: 6 %
Neutro Abs: 5.5 10*3/uL (ref 1.7–7.7)
Neutrophils Relative %: 61 %
Platelet Count: 188 10*3/uL (ref 150–400)
RBC: 5.53 MIL/uL — ABNORMAL HIGH (ref 3.87–5.11)
RDW: 13.9 % (ref 11.5–15.5)
WBC Count: 9.3 10*3/uL (ref 4.0–10.5)
nRBC: 0 % (ref 0.0–0.2)

## 2022-03-19 LAB — CMP (CANCER CENTER ONLY)
ALT: 11 U/L (ref 0–44)
AST: 12 U/L — ABNORMAL LOW (ref 15–41)
Albumin: 3.7 g/dL (ref 3.5–5.0)
Alkaline Phosphatase: 28 U/L — ABNORMAL LOW (ref 38–126)
Anion gap: 7 (ref 5–15)
BUN: 13 mg/dL (ref 8–23)
CO2: 33 mmol/L — ABNORMAL HIGH (ref 22–32)
Calcium: 9 mg/dL (ref 8.9–10.3)
Chloride: 100 mmol/L (ref 98–111)
Creatinine: 1.04 mg/dL — ABNORMAL HIGH (ref 0.44–1.00)
GFR, Estimated: >60 mL/min (ref >60)
Glucose, Bld: 114 mg/dL — ABNORMAL HIGH (ref 70–99)
Potassium: 3.9 mmol/L (ref 3.5–5.1)
Sodium: 140 mmol/L (ref 135–145)
Total Bilirubin: 0.5 mg/dL (ref 0.3–1.2)
Total Protein: 7.2 g/dL (ref 6.5–8.1)

## 2022-03-19 MED ORDER — SODIUM CHLORIDE 0.9% FLUSH
10.0000 mL | INTRAVENOUS | Status: DC | PRN
  Administered 2022-03-19: 10 mL

## 2022-03-19 MED ORDER — ACETAMINOPHEN 325 MG PO TABS
650.0000 mg | ORAL_TABLET | Freq: Once | ORAL | Status: AC
  Administered 2022-03-19: 650 mg via ORAL
  Filled 2022-03-19: qty 2

## 2022-03-19 MED ORDER — HEPARIN SOD (PORK) LOCK FLUSH 100 UNIT/ML IV SOLN
500.0000 [IU] | Freq: Once | INTRAVENOUS | Status: AC | PRN
  Administered 2022-03-19: 500 [IU]

## 2022-03-19 MED ORDER — SODIUM CHLORIDE 0.9 % IV SOLN: Freq: Once | INTRAVENOUS | Status: AC

## 2022-03-19 MED ORDER — SODIUM CHLORIDE 0.9% FLUSH
10.0000 mL | Freq: Once | INTRAVENOUS | Status: AC
  Administered 2022-03-19: 10 mL

## 2022-03-19 MED ORDER — DIPHENHYDRAMINE HCL 25 MG PO CAPS
25.0000 mg | ORAL_CAPSULE | Freq: Once | ORAL | Status: AC
  Administered 2022-03-19: 25 mg via ORAL
  Filled 2022-03-19: qty 1

## 2022-03-19 MED ORDER — TRASTUZUMAB-DKST CHEMO 150 MG IV SOLR
6.0000 mg/kg | Freq: Once | INTRAVENOUS | Status: AC
  Administered 2022-03-19: 567 mg via INTRAVENOUS
  Filled 2022-03-19: qty 27

## 2022-03-19 NOTE — Assessment & Plan Note (Addendum)
08/19/2021: Bilateral mastectomies Right mastectomy: Foci of intermediate to high-grade DCIS, sclerosing adenosis and complex sclerosing lesion, margins negative, 0/4 lymph nodes negative, ER 100%, PR 70% Left mastectomy: 2.5 cm grade 3 IDC with DCIS high-grade, margins negative, 0/4 lymph nodes negative ER 90%, PR 70%, HER2 positive, Ki-67 40% --------------------------------------------------------------------------------------------------------------------------------------------- Current treatment: Herceptin started 10/02/2021(patient rejected chemotherapy) Herceptin toxicities:None.  Letrozole Toxicities: Planned treatment duration is 7years. Complains of hot flashes 4-5 times a day.  I instructed her to take letrozole at bedtime. Dryness of the skin: Bothering her  Lump in the right thigh inner surface, doesn't feel pathologic, doesn't entirely feel like lipoma either. She said there is no change in size in the past 2 to 3 months.  I encouraged her to keep an eye on it and let us know if it starts growing. Echo 03/13/2022: EF 60 to 65%  Return to clinic every 3 weeks for Herceptin every 6 weeks for follow-up withme

## 2022-03-19 NOTE — Patient Instructions (Signed)
[facility] MEDICAL [facility]COLOG[facility]  Discharge Instructions: Thank you for choosing [facility] to provide your oncology and hematology care.   If you have a lab appointment with the Cancer Center, please go directly to the Cancer Center and check in at the registration area.   Wear comfortable clothing and clothing appropriate for easy access to any Portacath or PICC line.   We strive to give you quality time with your provider. You may need to reschedule your appointment if you arrive late (15 or more minutes).  Arriving late affects you and other patients whose appointments are after yours.  Also, if you miss three or more appointments without notifying the office, you may be dismissed from the clinic at the provider's discretion.      For prescription refill requests, have your pharmacy contact our office and allow 72 hours for refills to be completed.    Today you received the following chemotherapy and/or immunotherapy agent: Trastuzumab ([place])   To help prevent nausea and vomiting after your treatment, we encourage you to take your nausea medication as directed.  BELOW ARE SYMPTOMS THAT SHOULD BE REPORTED IMMEDIATELY: *FEVER GREATER THAN 100.4 F (38 C) OR HIGHER *CHILLS OR SWEATING *NAUSEA AND VOMITING THAT IS NOT CONTROLLED WITH YOUR NAUSEA MEDICATION *UNUSUAL SHORTNESS OF BREATH *UNUSUAL BRUISING OR BLEEDING *URINARY PROBLEMS (pain or burning when urinating, or frequent urination) *BOWEL PROBLEMS (unusual diarrhea, constipation, pain near the anus) TENDERNESS IN MOUTH AND THROAT WITH OR WITHOUT PRESENCE OF ULCERS (sore throat, sores in mouth, or a toothache) UNUSUAL RASH, SWELLING OR PAIN  UNUSUAL VAGINAL DISCHARGE OR ITCHING   Items with * indicate a potential emergency and should be followed up as soon as possible or go to the Emergency Department if any problems should occur.  Please show the CHEMOTHERAPY ALERT CARD or IMMUNOTHERAPY ALERT CARD at  check-in to the Emergency Department and triage nurse.  Should you have questions after your visit or need to cancel or reschedule your appointment, please contact [facility]  Dept: [phone]  and follow the prompts.  Office hours are 8:00 a.m. to 4:30 p.m. Monday - Friday. Please note that voicemails left after 4:00 p.m. may not be returned until the following business day.  We are closed weekends and major holidays. You have access to a nurse at all times for urgent questions. Please call the main number to the clinic Dept: [phone] and follow the prompts.   For any non-urgent questions, you may also contact your provider using MyChart. We now offer e-Visits for anyone 18 and older to request care online for non-urgent symptoms. For details visit [email or web address].   Also download the MyChart app! Go to the app store, search "MyChart", open the app, select , and log in with your MyChart username and password.  Due to Covid, a mask is required upon entering the hospital/clinic. If you do not have a mask, one will be given to you upon arrival. For doctor visits, patients may have 1 support person aged 18 or older with them. For treatment visits, patients cannot have anyone with them due to current Covid guidelines and our immunocompromised population.  

## 2022-03-20 ENCOUNTER — Telehealth: Payer: Self-pay | Admitting: Hematology and Oncology

## 2022-03-20 NOTE — Telephone Encounter (Signed)
Scheduled appointment per 5/24 los. Called [phone] and got the message, "please enter your remote access code. Scheduled hung up the phone and called [phone]. The phone rang and got a message that said. "Your call can't not be completed at this time." Patient will be mailed an updated calendar.

## 2022-03-31 ENCOUNTER — Encounter: Payer: Self-pay | Admitting: *Deleted

## 2022-04-01 ENCOUNTER — Inpatient Hospital Stay: Payer: PPO

## 2022-04-08 ENCOUNTER — Other Ambulatory Visit: Payer: Self-pay

## 2022-04-08 ENCOUNTER — Inpatient Hospital Stay: Payer: PPO | Attending: Hematology and Oncology

## 2022-04-08 VITALS — BP 129/71 | HR 75 | Temp 97.8°F | Resp 18 | Wt 228.8 lb

## 2022-04-08 DIAGNOSIS — C50412 Malignant neoplasm of upper-outer quadrant of left female breast: Secondary | ICD-10-CM | POA: Insufficient documentation

## 2022-04-08 DIAGNOSIS — Z5112 Encounter for antineoplastic immunotherapy: Secondary | ICD-10-CM | POA: Diagnosis present

## 2022-04-08 DIAGNOSIS — D0511 Intraductal carcinoma in situ of right breast: Secondary | ICD-10-CM | POA: Insufficient documentation

## 2022-04-08 DIAGNOSIS — C50911 Malignant neoplasm of unspecified site of right female breast: Secondary | ICD-10-CM

## 2022-04-08 DIAGNOSIS — Z170 Estrogen receptor positive status [ER+]: Secondary | ICD-10-CM | POA: Diagnosis not present

## 2022-04-08 MED ORDER — ACETAMINOPHEN 325 MG PO TABS
650.0000 mg | ORAL_TABLET | Freq: Once | ORAL | Status: AC
  Administered 2022-04-08: 650 mg via ORAL
  Filled 2022-04-08: qty 2

## 2022-04-08 MED ORDER — SODIUM CHLORIDE 0.9% FLUSH
10.0000 mL | INTRAVENOUS | Status: DC | PRN
  Administered 2022-04-08: 10 mL

## 2022-04-08 MED ORDER — HEPARIN SOD (PORK) LOCK FLUSH 100 UNIT/ML IV SOLN
500.0000 [IU] | Freq: Once | INTRAVENOUS | Status: AC | PRN
  Administered 2022-04-08: 500 [IU]

## 2022-04-08 MED ORDER — TRASTUZUMAB-DKST CHEMO 150 MG IV SOLR
6.0000 mg/kg | Freq: Once | INTRAVENOUS | Status: AC
  Administered 2022-04-08: 567 mg via INTRAVENOUS
  Filled 2022-04-08: qty 27

## 2022-04-08 MED ORDER — DIPHENHYDRAMINE HCL 25 MG PO CAPS
25.0000 mg | ORAL_CAPSULE | Freq: Once | ORAL | Status: AC
  Administered 2022-04-08: 25 mg via ORAL
  Filled 2022-04-08: qty 1

## 2022-04-08 MED ORDER — SODIUM CHLORIDE 0.9 % IV SOLN: Freq: Once | INTRAVENOUS | Status: AC

## 2022-05-01 ENCOUNTER — Inpatient Hospital Stay: Payer: PPO

## 2022-05-01 ENCOUNTER — Telehealth: Payer: Self-pay

## 2022-05-01 ENCOUNTER — Inpatient Hospital Stay: Payer: PPO | Attending: Hematology and Oncology | Admitting: Hematology and Oncology

## 2022-05-01 DIAGNOSIS — D0511 Intraductal carcinoma in situ of right breast: Secondary | ICD-10-CM | POA: Diagnosis not present

## 2022-05-01 DIAGNOSIS — Z5112 Encounter for antineoplastic immunotherapy: Secondary | ICD-10-CM | POA: Insufficient documentation

## 2022-05-01 DIAGNOSIS — C50911 Malignant neoplasm of unspecified site of right female breast: Secondary | ICD-10-CM | POA: Diagnosis not present

## 2022-05-01 DIAGNOSIS — C50412 Malignant neoplasm of upper-outer quadrant of left female breast: Secondary | ICD-10-CM | POA: Insufficient documentation

## 2022-05-01 DIAGNOSIS — Z79899 Other long term (current) drug therapy: Secondary | ICD-10-CM | POA: Diagnosis not present

## 2022-05-01 DIAGNOSIS — Z9013 Acquired absence of bilateral breasts and nipples: Secondary | ICD-10-CM | POA: Insufficient documentation

## 2022-05-01 DIAGNOSIS — Z170 Estrogen receptor positive status [ER+]: Secondary | ICD-10-CM | POA: Insufficient documentation

## 2022-05-01 DIAGNOSIS — C50912 Malignant neoplasm of unspecified site of left female breast: Secondary | ICD-10-CM | POA: Diagnosis not present

## 2022-05-01 MED ORDER — DIPHENHYDRAMINE HCL 25 MG PO CAPS
25.0000 mg | ORAL_CAPSULE | Freq: Once | ORAL | Status: AC
  Administered 2022-05-01: 25 mg via ORAL

## 2022-05-01 MED ORDER — LIDOCAINE-PRILOCAINE 2.5-2.5 % EX CREA: TOPICAL_CREAM | CUTANEOUS | 3 refills | Status: DC

## 2022-05-01 MED ORDER — HEPARIN SOD (PORK) LOCK FLUSH 100 UNIT/ML IV SOLN
500.0000 [IU] | Freq: Once | INTRAVENOUS | Status: AC | PRN
  Administered 2022-05-01: 500 [IU]

## 2022-05-01 MED ORDER — SODIUM CHLORIDE 0.9 % IV SOLN: Freq: Once | INTRAVENOUS | Status: AC

## 2022-05-01 MED ORDER — ACETAMINOPHEN 325 MG PO TABS
650.0000 mg | ORAL_TABLET | Freq: Once | ORAL | Status: AC
  Administered 2022-05-01: 650 mg via ORAL

## 2022-05-01 MED ORDER — TRASTUZUMAB-DKST CHEMO 150 MG IV SOLR
6.0000 mg/kg | Freq: Once | INTRAVENOUS | Status: AC
  Administered 2022-05-01: 567 mg via INTRAVENOUS
  Filled 2022-05-01: qty 27

## 2022-05-01 MED ORDER — SODIUM CHLORIDE 0.9% FLUSH
10.0000 mL | INTRAVENOUS | Status: DC | PRN
  Administered 2022-05-01: 10 mL

## 2022-05-01 NOTE — Assessment & Plan Note (Addendum)
08/19/2021: Bilateral mastectomies Right mastectomy: Foci of intermediate to high-grade DCIS, sclerosing adenosis and complex sclerosing lesion, margins negative, 0/4 lymph nodes negative, ER 100%, PR 70% Left mastectomy: 2.5 cm grade 3 IDC with DCIS high-grade, margins negative, 0/4 lymph nodes negative ER 90%, PR 70%, HER2 positive, Ki-67 40% --------------------------------------------------------------------------------------------------------------------------------------------- Current treatment: Herceptin started 10/02/2021(patient rejected chemotherapy) Herceptin toxicities:None.  Letrozole Toxicities: 1.Severe fatigue 2.weight gain: Although I believe that the weight gain is coming from her dietary habits. 3.Hot flashes  Planned treatment duration is 7years.  Echo 03/13/2022: EF 60 to 65%  Return to clinic every 3 weeks for Herceptin every 6 weeks for follow-up withme 

## 2022-05-01 NOTE — Progress Notes (Signed)
Patient Care Team: [name], MD as PCP - General (Family Medicine) [name], RN as Oncology Nurse Navigator [name], RN as Oncology Nurse Navigator [name], MD as Consulting Physician (General Surgery) [name], MD as Consulting Physician (Hematology and Oncology) [name], MD as Consulting Physician (Radiation Oncology)  DIAGNOSIS:  Encounter Diagnoses  Name Primary?   Malignant neoplasm of upper-outer quadrant of left breast in female, estrogen receptor positive ([name])    Bilateral malignant neoplasm of breast in female, estrogen receptor positive, unspecified site of breast (April [name])     SUMMARY OF ONCOLOGIC HISTORY: Oncology History  Ductal carcinoma in situ (DCIS) of right breast  06/28/2021 Initial Diagnosis   Palpable mass in the left breast. Diagnostic mammogram and [place]: suspicious 1.7 cm mass in the left breast at the 1 o'clock position and 2 subtle areas of distortion in the upper outer right breast.  Left breast biopsy: Grade 3 IDC high grade focal DCIS Her2+/ER+(90%)/PR+(70%) in the left breast, and DCIS involving sclerosing adenosis with calcifications ER+(100%)/PR+(70%) in the right breast.    07/08/2021 Initial Diagnosis   Ductal carcinoma in situ (DCIS) of right breast   08/19/2021 Surgery   Right mastectomy: Foci of intermediate to high-grade DCIS, sclerosing adenosis and complex sclerosing lesion, margins negative, 0/4 lymph nodes negative, ER 100%, PR 70% Left mastectomy: 2.5 cm grade 3 IDC with DCIS high-grade, margins negative, 0/4 lymph nodes negative ER 90%, PR 70%, HER2 positive, Ki-67 40%   Malignant neoplasm of upper-outer quadrant of left breast in female, estrogen receptor positive ([name])  06/28/2021 Initial Diagnosis   Palpable mass in the left breast. Diagnostic mammogram and [place]: suspicious 1.7 cm mass in the left breast at the 1 o'clock position and 2 subtle areas of distortion in the upper outer right breast.  Left breast  biopsy: Grade 3 IDC high grade focal DCIS Her2+/ER+(90%)/PR+(70%) in the left breast, and DCIS involving sclerosing adenosis with calcifications ER+(100%)/PR+(70%) in the right breast.    07/10/2021 Cancer Staging   Staging form: Breast, AJCC 8th Edition - Clinical stage from 07/10/2021: Stage IA (cT1c, cN0, cM0, G3, ER+, PR+, HER2+) - Signed by [name], MD on 07/10/2021 Stage prefix: Initial diagnosis Histologic grading system: 3 grade system    Genetic Testing   [facility] CustomNext (47 gene) Panel was negative. No pathogenic variants were identified. The report date is 07/23/2021.  The CustomNext-Cancer+RNAinsight panel offered by [facility] includes sequencing and rearrangement analysis for the following 47 genes:  APC, ATM, AXIN2, BARD1, BMPR1A, BRCA1, BRCA2, BRIP1, CDH1, CDK4, CDKN2A, CHEK2, DICER1, EPCAM, GREM1, HOXB13, MEN1, MLH1, MSH2, MSH3, MSH6, MUTYH, NBN, NF1, NF2, NTHL1, PALB2, PMS2, POLD1, POLE, PTEN, RAD51C, RAD51D, RECQL, RET, SDHA, SDHAF2, SDHB, SDHC, SDHD, SMAD4, SMARCA4, STK11, TP53, TSC1, TSC2, and VHL.  RNA data is routinely analyzed for use in variant interpretation for all genes.   Bilateral breast cancer (April [name])  08/19/2021 Initial Diagnosis   Bilateral breast cancer ([name])   10/02/2021 -  Chemotherapy   Patient is on Treatment Plan : BREAST Trastuzumab q21d x 13 cycles       CHIEF COMPLIANT: Herceptin follow-up  INTERVAL HISTORY: April [name] is a  66 y.o. with above-mentioned history of bilateral breast cancer having undergone bilateral mastectomies, currently on chemotherapy with trastuzumab. She presents to the clinic today for follow-up. She states that she is tired all the time and has no energy. The tiredness has been going on for about a month. She is trying to  get over a  sinus infection. She notice the fatigue every time after treatment. She also have concerns about weight gain. She also is eating a lot of sweets.   ALLERGIES:  has No Known  Allergies.  MEDICATIONS:  Current Outpatient Medications  Medication Sig Dispense Refill   acetaminophen (TYLENOL) 500 MG tablet Take 1,000 mg by mouth every 8 (eight) hours as needed for moderate pain.     albuterol (VENTOLIN HFA) 108 (90 Base) MCG/ACT inhaler Inhale 2 puffs into the lungs every 6 (six) hours as needed for wheezing or shortness of breath.     aspirin-acetaminophen-caffeine (EXCEDRIN MIGRAINE) 250-250-65 MG per tablet Take 2 tablets by mouth every 6 (six) hours as needed for migraine.      clonazePAM (KLONOPIN) 0.5 MG tablet Take 0.5 mg by mouth in the morning, at noon, and at bedtime.  5   clopidogrel (PLAVIX) 75 MG tablet Take 75 mg by mouth daily.     fluticasone (FLONASE) 50 MCG/ACT nasal spray Place 2 sprays into both nostrils daily as needed for allergies or rhinitis.     letrozole (FEMARA) 2.5 MG tablet Take 1 tablet (2.5 mg total) by mouth daily. 90 tablet 3   lidocaine-prilocaine (EMLA) cream Apply to affected area once 30 g 3   methocarbamol (ROBAXIN) 500 MG tablet Take 1 tablet (500 mg total) by mouth every 6 (six) hours as needed for muscle spasms. 30 tablet 2   nicotine (NICODERM CQ - DOSED IN MG/24 HR) 7 mg/24hr patch Place 1 patch (7 mg total) onto the skin daily. (Patient not taking: No sig reported) 28 patch 0   pantoprazole (PROTONIX) 40 MG tablet Take 40 mg by mouth 2 (two) times daily.     promethazine (PHENERGAN) 25 MG tablet Take 25 mg by mouth every 8 (eight) hours as needed for nausea or vomiting.     simvastatin (ZOCOR) 40 MG tablet Take 40 mg by mouth daily.     sodium chloride (OCEAN) 0.65 % SOLN nasal spray Place 2 sprays into both nostrils 3 (three) times daily as needed for congestion.      tiotropium (SPIRIVA) 18 MCG inhalation capsule Place 18 mcg into inhaler and inhale daily.     trazodone (DESYREL) 300 MG tablet Take 300 mg by mouth at bedtime.     varenicline (CHANTIX) 0.5 MG tablet Take 0.5 mg by mouth daily.     venlafaxine XR (EFFEXOR-XR)  150 MG 24 hr capsule Take 150 mg by mouth daily with breakfast.     No current facility-administered medications for this visit.    PHYSICAL EXAMINATION: ECOG PERFORMANCE STATUS: 1 - Symptomatic but completely ambulatory  Vitals:   05/01/22 1506  BP: 139/78  Pulse: 72  Resp: 18  Temp: (!) 97.3 F (36.3 C)  SpO2: 100%   Filed Weights   05/01/22 1506  Weight: 233 lb 12.8 oz (106.1 kg)      LABORATORY DATA:  I have reviewed the data as listed    Latest Ref Rng & Units 03/19/2022    1:51 PM 02/04/2022    4:15 PM 12/24/2021    1:51 PM  CMP  Glucose 70 - 99 mg/dL 114  117  102   BUN 8 - 23 mg/dL 13  13  11    Creatinine 0.44 - 1.00 mg/dL 1.04  1.07  1.01   Sodium 135 - 145 mmol/L 140  140  139   Potassium 3.5 - 5.1 mmol/L 3.9  3.7  4.1  Chloride 98 - 111 mmol/L 100  103  101   CO2 22 - 32 mmol/L 33  32  34   Calcium 8.9 - 10.3 mg/dL 9.0  8.6  9.1   Total Protein 6.5 - 8.1 g/dL 7.2  7.1  6.9   Total Bilirubin 0.3 - 1.2 mg/dL 0.5  0.3  0.4   Alkaline Phos 38 - 126 U/L 28  25  28    AST 15 - 41 U/L 12  14  10    ALT 0 - 44 U/L 11  10  9      Lab Results  Component Value Date   WBC 9.3 03/19/2022   HGB 15.4 (H) 03/19/2022   HCT 48.4 (H) 03/19/2022   MCV 87.5 03/19/2022   PLT 188 03/19/2022   NEUTROABS 5.5 03/19/2022    ASSESSMENT & PLAN:  Malignant neoplasm of upper-outer quadrant of left breast in female, estrogen receptor positive ([place]) 08/19/2021: Bilateral mastectomies Right mastectomy: Foci of intermediate to high-grade DCIS, sclerosing adenosis and complex sclerosing lesion, margins negative, 0/4 lymph nodes negative, ER 100%, PR 70% Left mastectomy: 2.5 cm grade 3 IDC with DCIS high-grade, margins negative, 0/4 lymph nodes negative ER 90%, PR 70%, HER2 positive, Ki-67 40% --------------------------------------------------------------------------------------------------------------------------------------------- Current treatment: Herceptin started 10/02/2021  (patient rejected chemotherapy) Herceptin toxicities: None.   Letrozole Toxicities:  1.  Severe fatigue 2. weight gain: Although I believe that the weight gain is coming from her dietary habits. 3.  Hot flashes  Planned treatment duration is 7 years.     Echo 03/13/2022: EF 60 to 65%   Return to clinic every 3 weeks for Herceptin every 6 weeks for follow-up with me    No orders of the defined types were placed in this encounter.  The patient has a good understanding of the overall plan. she agrees with it. she will call with any problems that may develop before the next visit here. Total time spent: 30 mins including face to face time and time spent for planning, charting and co-ordination of care   Harriette Ohara, MD 05/01/22    I Gardiner Coins am scribing for Dr. Lindi Adie  I have reviewed the above documentation for accuracy and completeness, and I agree with the above.

## 2022-05-01 NOTE — Telephone Encounter (Signed)
Attempted to call pt 3X regarding appt scheduled for today. Pt did not answer. LVM for call back.

## 2022-05-01 NOTE — Progress Notes (Signed)
Per Dr. Lindi Adie continue trastuzumab 567 mg dosing despite calculated dose being >10% difference.   Larene Beach, PharmD

## 2022-05-01 NOTE — Patient Instructions (Signed)
[facility]  Discharge Instructions: Thank you for choosing [facility] to provide your oncology and hematology care.   If you have a lab appointment with the [facility], please go directly to the [facility] and check in at the registration area.   Wear comfortable clothing and clothing appropriate for easy access to any Portacath or PICC line.   We strive to give you quality time with your provider. You may need to reschedule your appointment if you arrive late (15 or more minutes).  Arriving late affects you and other patients whose appointments are after yours.  Also, if you miss three or more appointments without notifying the office, you may be dismissed from the clinic at the provider's discretion.      For prescription refill requests, have your pharmacy contact our office and allow 72 hours for refills to be completed.    Today you received the following chemotherapy and/or immunotherapy agent: Trastuzumab (Ogivri)   To help prevent nausea and vomiting after your treatment, we encourage you to take your nausea medication as directed.  BELOW ARE SYMPTOMS THAT SHOULD BE REPORTED IMMEDIATELY: *FEVER GREATER THAN 100.4 F (38 C) OR HIGHER *CHILLS OR SWEATING *NAUSEA AND VOMITING THAT IS NOT CONTROLLED WITH YOUR NAUSEA MEDICATION *UNUSUAL SHORTNESS OF BREATH *UNUSUAL BRUISING OR BLEEDING *URINARY PROBLEMS (pain or burning when urinating, or frequent urination) *BOWEL PROBLEMS (unusual diarrhea, constipation, pain near the anus) TENDERNESS IN MOUTH AND THROAT WITH OR WITHOUT PRESENCE OF ULCERS (sore throat, sores in mouth, or a toothache) UNUSUAL RASH, SWELLING OR PAIN  UNUSUAL VAGINAL DISCHARGE OR ITCHING   Items with * indicate a potential emergency and should be followed up as soon as possible or go to the Emergency Department if any problems should occur.  Please show the CHEMOTHERAPY ALERT CARD or IMMUNOTHERAPY ALERT CARD at  check-in to the Emergency Department and triage nurse.  Should you have questions after your visit or need to cancel or reschedule your appointment, please contact [facility]  Dept: [phone]  and follow the prompts.  Office hours are 8:00 a.m. to 4:30 p.m. Monday - Friday. Please note that voicemails left after 4:00 p.m. may not be returned until the following business day.  We are closed weekends and major holidays. You have access to a nurse at all times for urgent questions. Please call the main number to the clinic Dept: [phone] and follow the prompts.   For any non-urgent questions, you may also contact your provider using MyChart. We now offer e-Visits for anyone 72 and older to request care online for non-urgent symptoms. For details visit [email or web address].   Also download the MyChart app! Go to the app store, search "MyChart", open the app, select [facility], and log in with your MyChart username and password.  Due to Covid, a mask is required upon entering the hospital/clinic. If you do not have a mask, one will be given to you upon arrival. For doctor visits, patients may have 1 support person aged 19 or older with them. For treatment visits, patients cannot have anyone with them due to current Covid guidelines and our immunocompromised population.

## 2022-05-19 ENCOUNTER — Other Ambulatory Visit: Payer: Self-pay

## 2022-05-20 ENCOUNTER — Other Ambulatory Visit: Payer: Self-pay

## 2022-05-22 ENCOUNTER — Inpatient Hospital Stay: Payer: PPO

## 2022-05-27 ENCOUNTER — Other Ambulatory Visit: Payer: Self-pay

## 2022-05-30 ENCOUNTER — Other Ambulatory Visit: Payer: Self-pay

## 2022-06-11 ENCOUNTER — Encounter: Payer: Self-pay | Admitting: Physical Therapy

## 2022-06-11 NOTE — Progress Notes (Signed)
Patient Care Team: [name], MD as PCP - General (Family Medicine) [name], RN as Oncology Nurse Navigator [name], RN as Oncology Nurse Navigator [name], MD as Consulting Physician (General Surgery) [name], MD as Consulting Physician (Hematology and Oncology) [name], MD as Consulting Physician (Radiation Oncology)  DIAGNOSIS: No diagnosis found.  SUMMARY OF ONCOLOGIC HISTORY: Oncology History  Ductal carcinoma in situ (DCIS) of right breast  06/28/2021 Initial Diagnosis   Palpable mass in the left breast. Diagnostic mammogram and [place]: suspicious 1.7 cm mass in the left breast at the 1 o'clock position and 2 subtle areas of distortion in the upper outer right breast.  Left breast biopsy: Grade 3 IDC high grade focal DCIS Her2+/ER+(90%)/PR+(70%) in the left breast, and DCIS involving sclerosing adenosis with calcifications ER+(100%)/PR+(70%) in the right breast.    07/08/2021 Initial Diagnosis   Ductal carcinoma in situ (DCIS) of right breast   08/19/2021 Surgery   Right mastectomy: Foci of intermediate to high-grade DCIS, sclerosing adenosis and complex sclerosing lesion, margins negative, 0/4 lymph nodes negative, ER 100%, PR 70% Left mastectomy: 2.5 cm grade 3 IDC with DCIS high-grade, margins negative, 0/4 lymph nodes negative ER 90%, PR 70%, HER2 positive, Ki-67 40%   Malignant neoplasm of upper-outer quadrant of left breast in female, estrogen receptor positive ([place])  06/28/2021 Initial Diagnosis   Palpable mass in the left breast. Diagnostic mammogram and [place]: suspicious 1.7 cm mass in the left breast at the 1 o'clock position and 2 subtle areas of distortion in the upper outer right breast.  Left breast biopsy: Grade 3 IDC high grade focal DCIS Her2+/ER+(90%)/PR+(70%) in the left breast, and DCIS involving sclerosing adenosis with calcifications ER+(100%)/PR+(70%) in the right breast.    07/10/2021 Cancer Staging   Staging form: Breast, AJCC  8th Edition - Clinical stage from 07/10/2021: Stage IA (cT1c, cN0, cM0, G3, ER+, PR+, HER2+) - Signed by [name], MD on 07/10/2021 Stage prefix: Initial diagnosis Histologic grading system: 3 grade system    Genetic Testing   [facility] CustomNext (47 gene) Panel was negative. No pathogenic variants were identified. The report date is 07/23/2021.  The CustomNext-Cancer+RNAinsight panel offered by [employer] includes sequencing and rearrangement analysis for the following 47 genes:  APC, ATM, AXIN2, BARD1, BMPR1A, BRCA1, BRCA2, BRIP1, CDH1, CDK4, CDKN2A, CHEK2, DICER1, EPCAM, GREM1, HOXB13, MEN1, MLH1, MSH2, MSH3, MSH6, MUTYH, NBN, NF1, NF2, NTHL1, PALB2, PMS2, POLD1, POLE, PTEN, RAD51C, RAD51D, RECQL, RET, SDHA, SDHAF2, SDHB, SDHC, SDHD, SMAD4, SMARCA4, STK11, TP53, TSC1, TSC2, and VHL.  RNA data is routinely analyzed for use in variant interpretation for all genes.   Bilateral breast cancer ([place])  08/19/2021 Initial Diagnosis   Bilateral breast cancer ([place])   10/02/2021 -  Chemotherapy   Patient is on Treatment Plan : BREAST Trastuzumab q21d x 13 cycles       CHIEF COMPLIANT:  Herceptin follow-up  INTERVAL HISTORY: April [name] is a 66 y.o. with above-mentioned history of bilateral breast cancer having undergone bilateral mastectomies, currently on chemotherapy with trastuzumab. She presents to the clinic today for follow-up.    ALLERGIES:  has No Known Allergies.  MEDICATIONS:  Current Outpatient Medications  Medication Sig Dispense Refill   acetaminophen (TYLENOL) 500 MG tablet Take 1,000 mg by mouth every 8 (eight) hours as needed for moderate pain.     albuterol (VENTOLIN HFA) 108 (90 Base) MCG/ACT inhaler Inhale 2 puffs into the lungs every 6 (six) hours as needed for wheezing  or shortness of breath.     aspirin-acetaminophen-caffeine (EXCEDRIN MIGRAINE) 250-250-65 MG per tablet Take 2 tablets by mouth every 6 (six) hours as needed for migraine.      clonazePAM (KLONOPIN) 0.5  MG tablet Take 0.5 mg by mouth in the morning, at noon, and at bedtime.  5   clopidogrel (PLAVIX) 75 MG tablet Take 75 mg by mouth daily.     fluticasone (FLONASE) 50 MCG/ACT nasal spray Place 2 sprays into both nostrils daily as needed for allergies or rhinitis.     letrozole (FEMARA) 2.5 MG tablet Take 1 tablet (2.5 mg total) by mouth daily. 90 tablet 3   lidocaine-prilocaine (EMLA) cream Apply to affected area once 30 g 3   methocarbamol (ROBAXIN) 500 MG tablet Take 1 tablet (500 mg total) by mouth every 6 (six) hours as needed for muscle spasms. 30 tablet 2   nicotine (NICODERM CQ - DOSED IN MG/24 HR) 7 mg/24hr patch Place 1 patch (7 mg total) onto the skin daily. (Patient not taking: No sig reported) 28 patch 0   pantoprazole (PROTONIX) 40 MG tablet Take 40 mg by mouth 2 (two) times daily.     promethazine (PHENERGAN) 25 MG tablet Take 25 mg by mouth every 8 (eight) hours as needed for nausea or vomiting.     simvastatin (ZOCOR) 40 MG tablet Take 40 mg by mouth daily.     sodium chloride (OCEAN) 0.65 % SOLN nasal spray Place 2 sprays into both nostrils 3 (three) times daily as needed for congestion.      tiotropium (SPIRIVA) 18 MCG inhalation capsule Place 18 mcg into inhaler and inhale daily.     trazodone (DESYREL) 300 MG tablet Take 300 mg by mouth at bedtime.     varenicline (CHANTIX) 0.5 MG tablet Take 0.5 mg by mouth daily.     venlafaxine XR (EFFEXOR-XR) 150 MG 24 hr capsule Take 150 mg by mouth daily with breakfast.     No current facility-administered medications for this visit.    PHYSICAL EXAMINATION: ECOG PERFORMANCE STATUS: {CHL ONC ECOG [phone]}  There were no vitals filed for this visit. There were no vitals filed for this visit.  BREAST:*** No palpable masses or nodules in either right or left breasts. No palpable axillary supraclavicular or infraclavicular adenopathy no breast tenderness or nipple discharge. (exam performed in the presence of a  chaperone)  LABORATORY DATA:  I have reviewed the data as listed    Latest Ref Rng & Units 03/19/2022    1:51 PM 02/04/2022    4:15 PM 12/24/2021    1:51 PM  CMP  Glucose 70 - 99 mg/dL 114  117  102   BUN 8 - 23 mg/dL 13  13  11    Creatinine 0.44 - 1.00 mg/dL 1.04  1.07  1.01   Sodium 135 - 145 mmol/L 140  140  139   Potassium 3.5 - 5.1 mmol/L 3.9  3.7  4.1   Chloride 98 - 111 mmol/L 100  103  101   CO2 22 - 32 mmol/L 33  32  34   Calcium 8.9 - 10.3 mg/dL 9.0  8.6  9.1   Total Protein 6.5 - 8.1 g/dL 7.2  7.1  6.9   Total Bilirubin 0.3 - 1.2 mg/dL 0.5  0.3  0.4   Alkaline Phos 38 - 126 U/L 28  25  28    AST 15 - 41 U/L 12  14  10    ALT 0 - 44  U/L 11  10  9      Lab Results  Component Value Date   WBC 9.3 03/19/2022   HGB 15.4 (H) 03/19/2022   HCT 48.4 (H) 03/19/2022   MCV 87.5 03/19/2022   PLT 188 03/19/2022   NEUTROABS 5.5 03/19/2022    ASSESSMENT & PLAN:  No problem-specific Assessment & Plan notes found for this encounter.    No orders of the defined types were placed in this encounter.  The patient has a good understanding of the overall plan. she agrees with it. she will call with any problems that may develop before the next visit here. Total time spent: 30 mins including face to face time and time spent for planning, charting and co-ordination of care   Suzzette Righter, [facility] 06/11/22    I Gardiner Coins am scribing for Dr. Lindi Adie  ***

## 2022-06-12 ENCOUNTER — Inpatient Hospital Stay: Payer: PPO

## 2022-06-12 ENCOUNTER — Inpatient Hospital Stay: Payer: PPO | Attending: Hematology and Oncology

## 2022-06-12 ENCOUNTER — Other Ambulatory Visit: Payer: Self-pay

## 2022-06-12 ENCOUNTER — Inpatient Hospital Stay (HOSPITAL_BASED_OUTPATIENT_CLINIC_OR_DEPARTMENT_OTHER): Payer: PPO | Admitting: Hematology and Oncology

## 2022-06-12 ENCOUNTER — Other Ambulatory Visit: Payer: Self-pay | Admitting: *Deleted

## 2022-06-12 DIAGNOSIS — Z452 Encounter for adjustment and management of vascular access device: Secondary | ICD-10-CM | POA: Insufficient documentation

## 2022-06-12 DIAGNOSIS — C50412 Malignant neoplasm of upper-outer quadrant of left female breast: Secondary | ICD-10-CM

## 2022-06-12 DIAGNOSIS — D0511 Intraductal carcinoma in situ of right breast: Secondary | ICD-10-CM | POA: Insufficient documentation

## 2022-06-12 DIAGNOSIS — Z5181 Encounter for therapeutic drug level monitoring: Secondary | ICD-10-CM

## 2022-06-12 DIAGNOSIS — C50911 Malignant neoplasm of unspecified site of right female breast: Secondary | ICD-10-CM

## 2022-06-12 DIAGNOSIS — Z170 Estrogen receptor positive status [ER+]: Secondary | ICD-10-CM | POA: Diagnosis not present

## 2022-06-12 DIAGNOSIS — Z5112 Encounter for antineoplastic immunotherapy: Secondary | ICD-10-CM | POA: Insufficient documentation

## 2022-06-12 DIAGNOSIS — Z9013 Acquired absence of bilateral breasts and nipples: Secondary | ICD-10-CM | POA: Insufficient documentation

## 2022-06-12 DIAGNOSIS — Z79811 Long term (current) use of aromatase inhibitors: Secondary | ICD-10-CM | POA: Insufficient documentation

## 2022-06-12 DIAGNOSIS — Z79899 Other long term (current) drug therapy: Secondary | ICD-10-CM | POA: Insufficient documentation

## 2022-06-12 DIAGNOSIS — Z95828 Presence of other vascular implants and grafts: Secondary | ICD-10-CM

## 2022-06-12 LAB — CBC WITH DIFFERENTIAL (CANCER CENTER ONLY)
Abs Immature Granulocytes: 0.02 10*3/uL (ref 0.00–0.07)
Basophils Absolute: 0 10*3/uL (ref 0.0–0.1)
Basophils Relative: 0 %
Eosinophils Absolute: 0.1 10*3/uL (ref 0.0–0.5)
Eosinophils Relative: 2 %
HCT: 51.5 % — ABNORMAL HIGH (ref 36.0–46.0)
Hemoglobin: 16.4 g/dL — ABNORMAL HIGH (ref 12.0–15.0)
Immature Granulocytes: 0 %
Lymphocytes Relative: 30 %
Lymphs Abs: 2.5 10*3/uL (ref 0.7–4.0)
MCH: 27.9 pg (ref 26.0–34.0)
MCHC: 31.8 g/dL (ref 30.0–36.0)
MCV: 87.6 fL (ref 80.0–100.0)
Monocytes Absolute: 0.5 10*3/uL (ref 0.1–1.0)
Monocytes Relative: 6 %
Neutro Abs: 5.1 10*3/uL (ref 1.7–7.7)
Neutrophils Relative %: 62 %
Platelet Count: 178 10*3/uL (ref 150–400)
RBC: 5.88 MIL/uL — ABNORMAL HIGH (ref 3.87–5.11)
RDW: 15.5 % (ref 11.5–15.5)
WBC Count: 8.3 10*3/uL (ref 4.0–10.5)
nRBC: 0 % (ref 0.0–0.2)

## 2022-06-12 MED ORDER — ALTEPLASE 2 MG IJ SOLR
2.0000 mg | Freq: Once | INTRAMUSCULAR | Status: AC
  Administered 2022-06-12: 2 mg
  Filled 2022-06-12: qty 2

## 2022-06-12 MED ORDER — HEPARIN SOD (PORK) LOCK FLUSH 100 UNIT/ML IV SOLN
500.0000 [IU] | Freq: Once | INTRAVENOUS | Status: AC | PRN
  Administered 2022-06-12: 500 [IU]

## 2022-06-12 MED ORDER — SODIUM CHLORIDE 0.9 % IV SOLN: Freq: Once | INTRAVENOUS | Status: AC

## 2022-06-12 MED ORDER — SODIUM CHLORIDE 0.9% FLUSH: 10.0000 mL | Freq: Once | INTRAVENOUS | Status: DC

## 2022-06-12 MED ORDER — ACETAMINOPHEN 325 MG PO TABS
650.0000 mg | ORAL_TABLET | Freq: Once | ORAL | Status: AC
  Administered 2022-06-12: 650 mg via ORAL
  Filled 2022-06-12: qty 2

## 2022-06-12 MED ORDER — DIPHENHYDRAMINE HCL 25 MG PO CAPS
25.0000 mg | ORAL_CAPSULE | Freq: Once | ORAL | Status: AC
  Administered 2022-06-12: 25 mg via ORAL
  Filled 2022-06-12: qty 1

## 2022-06-12 MED ORDER — SODIUM CHLORIDE 0.9% FLUSH
10.0000 mL | INTRAVENOUS | Status: DC | PRN
  Administered 2022-06-12: 10 mL

## 2022-06-12 MED ORDER — TRASTUZUMAB-DKST CHEMO 150 MG IV SOLR
6.0000 mg/kg | Freq: Once | INTRAVENOUS | Status: AC
  Administered 2022-06-12: 567 mg via INTRAVENOUS
  Filled 2022-06-12: qty 27

## 2022-06-12 NOTE — Assessment & Plan Note (Signed)
08/19/2021: Bilateral mastectomies Right mastectomy: Foci of intermediate to high-grade DCIS, sclerosing adenosis and complex sclerosing lesion, margins negative, 0/4 lymph nodes negative, ER 100%, PR 70% Left mastectomy: 2.5 cm grade 3 IDC with DCIS high-grade, margins negative, 0/4 lymph nodes negative ER 90%, PR 70%, HER2 positive, Ki-67 40% --------------------------------------------------------------------------------------------------------------------------------------------- Current treatment: Herceptin started 10/02/2021(patient rejected chemotherapy) Herceptin toxicities:None.  Letrozole Toxicities: 1.Severe fatigue 2.weight gain: Although I believe that the weight gain is coming from her dietary habits. 3.Hot flashes  Planned treatment duration is 7years.  Echo 03/13/2022: EF 60 to 65%  Return to clinic every 3 weeks for Herceptin every 6 weeks for follow-up withme 

## 2022-06-12 NOTE — Patient Instructions (Signed)
[facility]  Discharge Instructions: Thank you for choosing [facility] to provide your oncology and hematology care.   If you have a lab appointment with the Cancer Center, please go directly to the Cancer Center and check in at the registration area.   Wear comfortable clothing and clothing appropriate for easy access to any Portacath or PICC line.   We strive to give you quality time with your provider. You may need to reschedule your appointment if you arrive late (15 or more minutes).  Arriving late affects you and other patients whose appointments are after yours.  Also, if you miss three or more appointments without notifying the office, you may be dismissed from the clinic at the provider's discretion.      For prescription refill requests, have your pharmacy contact our office and allow 72 hours for refills to be completed.    Today you received the following chemotherapy and/or immunotherapy agent: Ogivri      To help prevent nausea and vomiting after your treatment, we encourage you to take your nausea medication as directed.  BELOW ARE SYMPTOMS THAT SHOULD BE REPORTED IMMEDIATELY: *FEVER GREATER THAN 100.4 F (38 C) OR HIGHER *CHILLS OR SWEATING *NAUSEA AND VOMITING THAT IS NOT CONTROLLED WITH YOUR NAUSEA MEDICATION *UNUSUAL SHORTNESS OF BREATH *UNUSUAL BRUISING OR BLEEDING *URINARY PROBLEMS (pain or burning when urinating, or frequent urination) *BOWEL PROBLEMS (unusual diarrhea, constipation, pain near the anus) TENDERNESS IN MOUTH AND THROAT WITH OR WITHOUT PRESENCE OF ULCERS (sore throat, sores in mouth, or a toothache) UNUSUAL RASH, SWELLING OR PAIN  UNUSUAL VAGINAL DISCHARGE OR ITCHING   Items with * indicate a potential emergency and should be followed up as soon as possible or go to the Emergency Department if any problems should occur.  Please show the CHEMOTHERAPY ALERT CARD or IMMUNOTHERAPY ALERT CARD at check-in to the  Emergency Department and triage nurse.  Should you have questions after your visit or need to cancel or reschedule your appointment, please contact [facility]  Dept: [phone]  and follow the prompts.  Office hours are 8:00 a.m. to 4:30 p.m. Monday - Friday. Please note that voicemails left after 4:00 p.m. may not be returned until the following business day.  We are closed weekends and major holidays. You have access to a nurse at all times for urgent questions. Please call the main number to the clinic Dept: [phone] and follow the prompts.   For any non-urgent questions, you may also contact your provider using MyChart. We now offer e-Visits for anyone 18 and older to request care online for non-urgent symptoms. For details visit [email or web address].   Also download the MyChart app! Go to the app store, search "MyChart", open the app, select [facility], and log in with your MyChart username and password.  Masks are optional in the cancer centers. If you would like for your care team to wear a mask while they are taking care of you, please let them know. You may have one support person who is at least 64 years old accompany you for your appointments. 

## 2022-06-12 NOTE — Progress Notes (Signed)
No reload of Trastuzumab today per Dr. Lindi Adie.  Kennith Center, Pharm.D., CPP 06/12/2022'@4'$ :16 PM

## 2022-06-19 ENCOUNTER — Inpatient Hospital Stay (HOSPITAL_COMMUNITY): Admission: RE | Admit: 2022-06-19 | Payer: PPO | Source: Ambulatory Visit

## 2022-06-30 ENCOUNTER — Other Ambulatory Visit: Payer: Self-pay | Admitting: Hematology and Oncology

## 2022-07-03 ENCOUNTER — Other Ambulatory Visit: Payer: Self-pay

## 2022-07-03 ENCOUNTER — Inpatient Hospital Stay: Payer: PPO | Attending: Hematology and Oncology

## 2022-07-03 ENCOUNTER — Encounter: Payer: Self-pay | Admitting: *Deleted

## 2022-07-03 VITALS — BP 124/63 | HR 92 | Temp 98.6°F | Resp 20 | Wt 233.5 lb

## 2022-07-03 DIAGNOSIS — Z170 Estrogen receptor positive status [ER+]: Secondary | ICD-10-CM | POA: Diagnosis not present

## 2022-07-03 DIAGNOSIS — C50911 Malignant neoplasm of unspecified site of right female breast: Secondary | ICD-10-CM

## 2022-07-03 DIAGNOSIS — D0511 Intraductal carcinoma in situ of right breast: Secondary | ICD-10-CM | POA: Diagnosis not present

## 2022-07-03 DIAGNOSIS — Z5112 Encounter for antineoplastic immunotherapy: Secondary | ICD-10-CM | POA: Insufficient documentation

## 2022-07-03 DIAGNOSIS — C50412 Malignant neoplasm of upper-outer quadrant of left female breast: Secondary | ICD-10-CM | POA: Diagnosis present

## 2022-07-03 MED ORDER — ACETAMINOPHEN 325 MG PO TABS
650.0000 mg | ORAL_TABLET | Freq: Once | ORAL | Status: AC
  Administered 2022-07-03: 650 mg via ORAL
  Filled 2022-07-03: qty 2

## 2022-07-03 MED ORDER — TRASTUZUMAB-DKST CHEMO 150 MG IV SOLR
6.0000 mg/kg | Freq: Once | INTRAVENOUS | Status: AC
  Administered 2022-07-03: 567 mg via INTRAVENOUS
  Filled 2022-07-03: qty 27

## 2022-07-03 MED ORDER — SODIUM CHLORIDE 0.9 % IV SOLN: Freq: Once | INTRAVENOUS | Status: AC

## 2022-07-03 MED ORDER — SODIUM CHLORIDE 0.9% FLUSH
10.0000 mL | INTRAVENOUS | Status: DC | PRN
  Administered 2022-07-03: 10 mL

## 2022-07-03 MED ORDER — HEPARIN SOD (PORK) LOCK FLUSH 100 UNIT/ML IV SOLN
500.0000 [IU] | Freq: Once | INTRAVENOUS | Status: AC | PRN
  Administered 2022-07-03: 500 [IU]

## 2022-07-03 MED ORDER — DIPHENHYDRAMINE HCL 25 MG PO CAPS
25.0000 mg | ORAL_CAPSULE | Freq: Once | ORAL | Status: AC
  Administered 2022-07-03: 25 mg via ORAL
  Filled 2022-07-03: qty 1

## 2022-07-03 NOTE — Progress Notes (Signed)
Per MD okay to treat today with Echo from 03/13/22.

## 2022-07-04 ENCOUNTER — Other Ambulatory Visit: Payer: Self-pay | Admitting: *Deleted

## 2022-07-04 DIAGNOSIS — Z5181 Encounter for therapeutic drug level monitoring: Secondary | ICD-10-CM

## 2022-07-07 ENCOUNTER — Ambulatory Visit: Payer: PPO

## 2022-07-18 ENCOUNTER — Other Ambulatory Visit (HOSPITAL_COMMUNITY): Payer: PPO

## 2022-07-21 NOTE — Progress Notes (Signed)
Patient Care Team: [name], MD as PCP - General (Family Medicine) [name], RN as Oncology Nurse Navigator [name], RN as Oncology Nurse Navigator [name], MD as Consulting Physician (General Surgery) [name], MD as Consulting Physician (Hematology and Oncology) [name], MD as Consulting Physician (Radiation Oncology)  DIAGNOSIS: No diagnosis found.  SUMMARY OF ONCOLOGIC HISTORY: Oncology History  Ductal carcinoma in situ (DCIS) of right breast  06/28/2021 Initial Diagnosis   Palpable mass in the left breast. Diagnostic mammogram and [place]: suspicious 1.7 cm mass in the left breast at the 1 o'clock position and 2 subtle areas of distortion in the upper outer right breast.  Left breast biopsy: Grade 3 IDC high grade focal DCIS Her2+/ER+(90%)/PR+(70%) in the left breast, and DCIS involving sclerosing adenosis with calcifications ER+(100%)/PR+(70%) in the right breast.    07/08/2021 Initial Diagnosis   Ductal carcinoma in situ (DCIS) of right breast   08/19/2021 Surgery   Right mastectomy: Foci of intermediate to high-grade DCIS, sclerosing adenosis and complex sclerosing lesion, margins negative, 0/4 lymph nodes negative, ER 100%, PR 70% Left mastectomy: 2.5 cm grade 3 IDC with DCIS high-grade, margins negative, 0/4 lymph nodes negative ER 90%, PR 70%, HER2 positive, Ki-67 40%   Malignant neoplasm of upper-outer quadrant of left breast in female, estrogen receptor positive ([place])  06/28/2021 Initial Diagnosis   Palpable mass in the left breast. Diagnostic mammogram and [place]: suspicious 1.7 cm mass in the left breast at the 1 o'clock position and 2 subtle areas of distortion in the upper outer right breast.  Left breast biopsy: Grade 3 IDC high grade focal DCIS Her2+/ER+(90%)/PR+(70%) in the left breast, and DCIS involving sclerosing adenosis with calcifications ER+(100%)/PR+(70%) in the right breast.    07/10/2021 Cancer Staging   Staging form: Breast, AJCC  8th Edition - Clinical stage from 07/10/2021: Stage IA (cT1c, cN0, cM0, G3, ER+, PR+, HER2+) - Signed by [name], MD on 07/10/2021 Stage prefix: Initial diagnosis Histologic grading system: 3 grade system    Genetic Testing   [facility] CustomNext (47 gene) Panel was negative. No pathogenic variants were identified. The report date is 07/23/2021.  The CustomNext-Cancer+RNAinsight panel offered by [facility] includes sequencing and rearrangement analysis for the following 47 genes:  APC, ATM, AXIN2, BARD1, BMPR1A, BRCA1, BRCA2, BRIP1, CDH1, CDK4, CDKN2A, CHEK2, DICER1, EPCAM, GREM1, HOXB13, MEN1, MLH1, MSH2, MSH3, MSH6, MUTYH, NBN, NF1, NF2, NTHL1, PALB2, PMS2, POLD1, POLE, PTEN, RAD51C, RAD51D, RECQL, RET, SDHA, SDHAF2, SDHB, SDHC, SDHD, SMAD4, SMARCA4, STK11, TP53, TSC1, TSC2, and VHL.  RNA data is routinely analyzed for use in variant interpretation for all genes.   Bilateral breast cancer ([place])  08/19/2021 Initial Diagnosis   Bilateral breast cancer ([place])   10/02/2021 -  Chemotherapy   Patient is on Treatment Plan : BREAST Trastuzumab q21d x 13 cycles       CHIEF COMPLIANT: Herceptin follow-up    INTERVAL HISTORY: April [name] is a 66 y.o. with above-mentioned history of bilateral breast cancer having undergone bilateral mastectomies, She presents to the clinic for a follow-up.    ALLERGIES:  has No Known Allergies.  MEDICATIONS:  Current Outpatient Medications  Medication Sig Dispense Refill   acetaminophen (TYLENOL) 500 MG tablet Take 1,000 mg by mouth every 8 (eight) hours as needed for moderate pain.     albuterol (VENTOLIN HFA) 108 (90 Base) MCG/ACT inhaler Inhale 2 puffs into the lungs every 6 (six) hours as needed for wheezing or shortness of breath.  aspirin-acetaminophen-caffeine (EXCEDRIN MIGRAINE) 250-250-65 MG per tablet Take 2 tablets by mouth every 6 (six) hours as needed for migraine.      clonazePAM (KLONOPIN) 0.5 MG tablet Take 0.5 mg by mouth in the  morning, at noon, and at bedtime.  5   clopidogrel (PLAVIX) 75 MG tablet Take 75 mg by mouth daily.     fluticasone (FLONASE) 50 MCG/ACT nasal spray Place 2 sprays into both nostrils daily as needed for allergies or rhinitis.     letrozole (FEMARA) 2.5 MG tablet Take 1 tablet (2.5 mg total) by mouth daily. 90 tablet 3   lidocaine-prilocaine (EMLA) cream Apply to affected area once 30 g 3   methocarbamol (ROBAXIN) 500 MG tablet Take 1 tablet (500 mg total) by mouth every 6 (six) hours as needed for muscle spasms. 30 tablet 2   nicotine (NICODERM CQ - DOSED IN MG/24 HR) 7 mg/24hr patch Place 1 patch (7 mg total) onto the skin daily. (Patient not taking: No sig reported) 28 patch 0   pantoprazole (PROTONIX) 40 MG tablet Take 40 mg by mouth 2 (two) times daily.     promethazine (PHENERGAN) 25 MG tablet Take 25 mg by mouth every 8 (eight) hours as needed for nausea or vomiting.     simvastatin (ZOCOR) 40 MG tablet Take 40 mg by mouth daily.     sodium chloride (OCEAN) 0.65 % SOLN nasal spray Place 2 sprays into both nostrils 3 (three) times daily as needed for congestion.      tiotropium (SPIRIVA) 18 MCG inhalation capsule Place 18 mcg into inhaler and inhale daily.     trazodone (DESYREL) 300 MG tablet Take 300 mg by mouth at bedtime.     varenicline (CHANTIX) 0.5 MG tablet Take 0.5 mg by mouth daily.     venlafaxine XR (EFFEXOR-XR) 150 MG 24 hr capsule Take 150 mg by mouth daily with breakfast.     No current facility-administered medications for this visit.    PHYSICAL EXAMINATION: ECOG PERFORMANCE STATUS: {CHL ONC ECOG PS:[id number]}  There were no vitals filed for this visit. There were no vitals filed for this visit.  BREAST:*** No palpable masses or nodules in either right or left breasts. No palpable axillary supraclavicular or infraclavicular adenopathy no breast tenderness or nipple discharge. (exam performed in the presence of a chaperone)  LABORATORY DATA:  I have reviewed the data  as listed    Latest Ref Rng & Units 03/19/2022    1:51 PM 02/04/2022    4:15 PM 12/24/2021    1:51 PM  CMP  Glucose 70 - 99 mg/dL 114  117  102   BUN 8 - 23 mg/dL _0 Creatinine 0.44 - 1.00 mg/dL 1.04  1.07  1.01   Sodium 135 - 145 mmol/L 140  140  139   Potassium 3.5 - 5.1 mmol/L 3.9  3.7  4.1   Chloride 98 - 111 mmol/L 100  103  101   CO2 22 - 32 mmol/L 33  32  34   Calcium 8.9 - 10.3 mg/dL 9.0  8.6  9.1   Total Protein 6.5 - 8.1 g/dL 7.2  7.1  6.9   Total Bilirubin 0.3 - 1.2 mg/dL 0.5  0.3  0.4   Alkaline Phos 38 - 126 U/L _1 AST 15 - 41 U/L _2 ALT 0 - 44 U/L 11  10  9  Lab Results  Component Value Date   WBC 8.3 06/12/2022   HGB 16.4 (H) 06/12/2022   HCT 51.5 (H) 06/12/2022   MCV 87.6 06/12/2022   PLT 178 06/12/2022   NEUTROABS 5.1 06/12/2022    ASSESSMENT & PLAN:  No problem-specific Assessment & Plan notes found for this encounter.    No orders of the defined types were placed in this encounter.  The patient has a good understanding of the overall plan. she agrees with it. she will call with any problems that may develop before the next visit here. Total time spent: 30 mins including face to face time and time spent for planning, charting and co-ordination of care   Suzzette Righter, [facility] 07/21/22    I Gardiner Coins am scribing for Dr. Lindi Adie  ***

## 2022-07-24 ENCOUNTER — Inpatient Hospital Stay (HOSPITAL_BASED_OUTPATIENT_CLINIC_OR_DEPARTMENT_OTHER): Payer: PPO | Admitting: Hematology and Oncology

## 2022-07-24 ENCOUNTER — Ambulatory Visit (HOSPITAL_COMMUNITY)
Admission: RE | Admit: 2022-07-24 | Discharge: 2022-07-24 | Disposition: A | Discharge status: Home / Self Care | Payer: PPO | Source: Ambulatory Visit | Attending: Hematology and Oncology | Admitting: Hematology and Oncology

## 2022-07-24 ENCOUNTER — Inpatient Hospital Stay: Payer: PPO

## 2022-07-24 ENCOUNTER — Other Ambulatory Visit: Payer: Self-pay

## 2022-07-24 VITALS — BP 130/61 | HR 70 | Resp 18

## 2022-07-24 DIAGNOSIS — R42 Dizziness and giddiness: Secondary | ICD-10-CM | POA: Insufficient documentation

## 2022-07-24 DIAGNOSIS — Z170 Estrogen receptor positive status [ER+]: Secondary | ICD-10-CM | POA: Diagnosis not present

## 2022-07-24 DIAGNOSIS — G459 Transient cerebral ischemic attack, unspecified: Secondary | ICD-10-CM | POA: Diagnosis not present

## 2022-07-24 DIAGNOSIS — Z5181 Encounter for therapeutic drug level monitoring: Secondary | ICD-10-CM | POA: Diagnosis not present

## 2022-07-24 DIAGNOSIS — J449 Chronic obstructive pulmonary disease, unspecified: Secondary | ICD-10-CM | POA: Diagnosis not present

## 2022-07-24 DIAGNOSIS — I509 Heart failure, unspecified: Secondary | ICD-10-CM | POA: Insufficient documentation

## 2022-07-24 DIAGNOSIS — Z95828 Presence of other vascular implants and grafts: Secondary | ICD-10-CM

## 2022-07-24 DIAGNOSIS — Z79899 Other long term (current) drug therapy: Secondary | ICD-10-CM | POA: Diagnosis not present

## 2022-07-24 DIAGNOSIS — Z0189 Encounter for other specified special examinations: Secondary | ICD-10-CM

## 2022-07-24 DIAGNOSIS — F17210 Nicotine dependence, cigarettes, uncomplicated: Secondary | ICD-10-CM | POA: Diagnosis not present

## 2022-07-24 DIAGNOSIS — Z853 Personal history of malignant neoplasm of breast: Secondary | ICD-10-CM | POA: Diagnosis not present

## 2022-07-24 DIAGNOSIS — Z5112 Encounter for antineoplastic immunotherapy: Secondary | ICD-10-CM | POA: Diagnosis not present

## 2022-07-24 DIAGNOSIS — C50412 Malignant neoplasm of upper-outer quadrant of left female breast: Secondary | ICD-10-CM

## 2022-07-24 DIAGNOSIS — C50911 Malignant neoplasm of unspecified site of right female breast: Secondary | ICD-10-CM

## 2022-07-24 LAB — CBC WITH DIFFERENTIAL (CANCER CENTER ONLY)
Abs Immature Granulocytes: 0.02 10*3/uL (ref 0.00–0.07)
Basophils Absolute: 0 10*3/uL (ref 0.0–0.1)
Basophils Relative: 0 %
Eosinophils Absolute: 0.2 10*3/uL (ref 0.0–0.5)
Eosinophils Relative: 2 %
HCT: 51.5 % — ABNORMAL HIGH (ref 36.0–46.0)
Hemoglobin: 16.2 g/dL — ABNORMAL HIGH (ref 12.0–15.0)
Immature Granulocytes: 0 %
Lymphocytes Relative: 26 %
Lymphs Abs: 2.5 10*3/uL (ref 0.7–4.0)
MCH: 27.4 pg (ref 26.0–34.0)
MCHC: 31.5 g/dL (ref 30.0–36.0)
MCV: 87 fL (ref 80.0–100.0)
Monocytes Absolute: 0.6 10*3/uL (ref 0.1–1.0)
Monocytes Relative: 7 %
Neutro Abs: 6.3 10*3/uL (ref 1.7–7.7)
Neutrophils Relative %: 65 %
Platelet Count: 169 10*3/uL (ref 150–400)
RBC: 5.92 MIL/uL — ABNORMAL HIGH (ref 3.87–5.11)
RDW: 15 % (ref 11.5–15.5)
WBC Count: 9.7 10*3/uL (ref 4.0–10.5)
nRBC: 0 % (ref 0.0–0.2)

## 2022-07-24 LAB — ECHOCARDIOGRAM COMPLETE
Area-P 1/2: 3.37 cm2
Calc EF: 72.7 %
S' Lateral: 2.8 cm
Single Plane A2C EF: 72.4 %
Single Plane A4C EF: 65.2 %

## 2022-07-24 MED ORDER — ACETAMINOPHEN 325 MG PO TABS
650.0000 mg | ORAL_TABLET | Freq: Once | ORAL | Status: AC
  Administered 2022-07-24: 650 mg via ORAL
  Filled 2022-07-24: qty 2

## 2022-07-24 MED ORDER — VARENICLINE TARTRATE 0.5 MG PO TABS: 0.5000 mg | ORAL_TABLET | Freq: Two times a day (BID) | ORAL | 0 refills | Status: DC

## 2022-07-24 MED ORDER — SODIUM CHLORIDE 0.9% FLUSH
10.0000 mL | INTRAVENOUS | Status: DC | PRN
  Administered 2022-07-24: 10 mL

## 2022-07-24 MED ORDER — SODIUM CHLORIDE 0.9% FLUSH
10.0000 mL | Freq: Once | INTRAVENOUS | Status: AC
  Administered 2022-07-24: 10 mL

## 2022-07-24 MED ORDER — TRASTUZUMAB-DKST CHEMO 150 MG IV SOLR
6.0000 mg/kg | Freq: Once | INTRAVENOUS | Status: AC
  Administered 2022-07-24: 567 mg via INTRAVENOUS
  Filled 2022-07-24: qty 27

## 2022-07-24 MED ORDER — HEPARIN SOD (PORK) LOCK FLUSH 100 UNIT/ML IV SOLN
500.0000 [IU] | Freq: Once | INTRAVENOUS | Status: AC | PRN
  Administered 2022-07-24: 500 [IU]

## 2022-07-24 MED ORDER — DIPHENHYDRAMINE HCL 25 MG PO CAPS
25.0000 mg | ORAL_CAPSULE | Freq: Once | ORAL | Status: AC
  Administered 2022-07-24: 25 mg via ORAL
  Filled 2022-07-24: qty 1

## 2022-07-24 MED ORDER — SODIUM CHLORIDE 0.9 % IV SOLN: Freq: Once | INTRAVENOUS | Status: AC

## 2022-07-24 NOTE — Patient Instructions (Signed)
[facility]  Discharge Instructions: Thank you for choosing [facility] to provide your oncology and hematology care.   If you have a lab appointment with the Cancer Center, please go directly to the Cancer Center and check in at the registration area.   Wear comfortable clothing and clothing appropriate for easy access to any Portacath or PICC line.   We strive to give you quality time with your provider. You may need to reschedule your appointment if you arrive late (15 or more minutes).  Arriving late affects you and other patients whose appointments are after yours.  Also, if you miss three or more appointments without notifying the office, you may be dismissed from the clinic at the provider's discretion.      For prescription refill requests, have your pharmacy contact our office and allow 72 hours for refills to be completed.    Today you received the following chemotherapy and/or immunotherapy agent: Ogivri      To help prevent nausea and vomiting after your treatment, we encourage you to take your nausea medication as directed.  BELOW ARE SYMPTOMS THAT SHOULD BE REPORTED IMMEDIATELY: *FEVER GREATER THAN 100.4 F (38 C) OR HIGHER *CHILLS OR SWEATING *NAUSEA AND VOMITING THAT IS NOT CONTROLLED WITH YOUR NAUSEA MEDICATION *UNUSUAL SHORTNESS OF BREATH *UNUSUAL BRUISING OR BLEEDING *URINARY PROBLEMS (pain or burning when urinating, or frequent urination) *BOWEL PROBLEMS (unusual diarrhea, constipation, pain near the anus) TENDERNESS IN MOUTH AND THROAT WITH OR WITHOUT PRESENCE OF ULCERS (sore throat, sores in mouth, or a toothache) UNUSUAL RASH, SWELLING OR PAIN  UNUSUAL VAGINAL DISCHARGE OR ITCHING   Items with * indicate a potential emergency and should be followed up as soon as possible or go to the Emergency Department if any problems should occur.  Please show the CHEMOTHERAPY ALERT CARD or IMMUNOTHERAPY ALERT CARD at check-in to the  Emergency Department and triage nurse.  Should you have questions after your visit or need to cancel or reschedule your appointment, please contact [facility]  Dept: [phone]  and follow the prompts.  Office hours are 8:00 a.m. to 4:30 p.m. Monday - Friday. Please note that voicemails left after 4:00 p.m. may not be returned until the following business day.  We are closed weekends and major holidays. You have access to a nurse at all times for urgent questions. Please call the main number to the clinic Dept: [phone] and follow the prompts.   For any non-urgent questions, you may also contact your provider using MyChart. We now offer e-Visits for anyone 18 and older to request care online for non-urgent symptoms. For details visit [email or web address].   Also download the MyChart app! Go to the app store, search "MyChart", open the app, select [facility], and log in with your MyChart username and password.  Masks are optional in the cancer centers. If you would like for your care team to wear a mask while they are taking care of you, please let them know. You may have one support person who is at least 64 years old accompany you for your appointments. 

## 2022-07-24 NOTE — Progress Notes (Signed)
  Echocardiogram 2D Echocardiogram has been performed.  April [name] 07/24/2022, 2:21 PM

## 2022-07-24 NOTE — Assessment & Plan Note (Signed)
08/19/2021: Bilateral mastectomies Right mastectomy: Foci of intermediate to high-grade DCIS, sclerosing adenosis and complex sclerosing lesion, margins negative, 0/4 lymph nodes negative, ER 100%, PR 70% Left mastectomy: 2.5 cm grade 3 IDC with DCIS high-grade, margins negative, 0/4 lymph nodes negative ER 90%, PR 70%, HER2 positive, Ki-67 40% --------------------------------------------------------------------------------------------------------------------------------------------- Current treatment: Herceptin started 10/02/2021(patient rejected chemotherapy) Herceptin toxicities:None.  Letrozole Toxicities: 1.Severe fatigue 2.weight gain: Although I believe that the weight gain is coming from her dietary habits. 3.Hot flashes  Planned treatment duration is 7years.  Echo 03/13/2022: EF 60 to 65%  Return to clinic every 3 weeks for Herceptin every 6 weeks for follow-up withme

## 2022-07-25 ENCOUNTER — Encounter: Payer: Self-pay | Admitting: *Deleted

## 2022-07-28 ENCOUNTER — Other Ambulatory Visit: Payer: Self-pay | Admitting: Hematology and Oncology

## 2022-07-28 DIAGNOSIS — C50911 Malignant neoplasm of unspecified site of right female breast: Secondary | ICD-10-CM

## 2022-07-30 ENCOUNTER — Telehealth: Payer: Self-pay | Admitting: Hematology and Oncology

## 2022-07-30 NOTE — Telephone Encounter (Signed)
Rescheduled appointment per provider on call. Left voicemail.  

## 2022-07-31 ENCOUNTER — Other Ambulatory Visit: Payer: Self-pay

## 2022-08-01 ENCOUNTER — Other Ambulatory Visit: Payer: Self-pay

## 2022-08-09 ENCOUNTER — Other Ambulatory Visit: Payer: Self-pay | Admitting: Hematology and Oncology

## 2022-08-11 ENCOUNTER — Other Ambulatory Visit: Payer: Self-pay | Admitting: *Deleted

## 2022-08-11 NOTE — Telephone Encounter (Signed)
Refills too soon to fill

## 2022-08-13 ENCOUNTER — Other Ambulatory Visit: Payer: Self-pay | Admitting: *Deleted

## 2022-08-13 DIAGNOSIS — C50412 Malignant neoplasm of upper-outer quadrant of left female breast: Secondary | ICD-10-CM

## 2022-08-14 ENCOUNTER — Inpatient Hospital Stay: Payer: PPO

## 2022-08-14 ENCOUNTER — Inpatient Hospital Stay: Payer: PPO | Admitting: Hematology and Oncology

## 2022-08-15 ENCOUNTER — Inpatient Hospital Stay (HOSPITAL_BASED_OUTPATIENT_CLINIC_OR_DEPARTMENT_OTHER): Payer: PPO | Admitting: Physician Assistant

## 2022-08-15 ENCOUNTER — Inpatient Hospital Stay: Payer: PPO | Attending: Hematology and Oncology

## 2022-08-15 ENCOUNTER — Inpatient Hospital Stay: Payer: PPO

## 2022-08-15 VITALS — BP 134/68 | HR 88 | Temp 97.7°F | Resp 16 | Wt 232.3 lb

## 2022-08-15 DIAGNOSIS — C50911 Malignant neoplasm of unspecified site of right female breast: Secondary | ICD-10-CM

## 2022-08-15 DIAGNOSIS — F17200 Nicotine dependence, unspecified, uncomplicated: Secondary | ICD-10-CM

## 2022-08-15 DIAGNOSIS — C50412 Malignant neoplasm of upper-outer quadrant of left female breast: Secondary | ICD-10-CM | POA: Diagnosis present

## 2022-08-15 DIAGNOSIS — Z5112 Encounter for antineoplastic immunotherapy: Secondary | ICD-10-CM | POA: Diagnosis present

## 2022-08-15 DIAGNOSIS — Z170 Estrogen receptor positive status [ER+]: Secondary | ICD-10-CM

## 2022-08-15 DIAGNOSIS — C50912 Malignant neoplasm of unspecified site of left female breast: Secondary | ICD-10-CM | POA: Diagnosis not present

## 2022-08-15 DIAGNOSIS — Z79899 Other long term (current) drug therapy: Secondary | ICD-10-CM | POA: Insufficient documentation

## 2022-08-15 DIAGNOSIS — Z9013 Acquired absence of bilateral breasts and nipples: Secondary | ICD-10-CM | POA: Diagnosis not present

## 2022-08-15 DIAGNOSIS — Z79811 Long term (current) use of aromatase inhibitors: Secondary | ICD-10-CM | POA: Diagnosis not present

## 2022-08-15 LAB — CBC WITH DIFFERENTIAL (CANCER CENTER ONLY)
Abs Immature Granulocytes: 0.03 10*3/uL (ref 0.00–0.07)
Basophils Absolute: 0 10*3/uL (ref 0.0–0.1)
Basophils Relative: 0 %
Eosinophils Absolute: 0.1 10*3/uL (ref 0.0–0.5)
Eosinophils Relative: 1 %
HCT: 49.2 % — ABNORMAL HIGH (ref 36.0–46.0)
Hemoglobin: 15.7 g/dL — ABNORMAL HIGH (ref 12.0–15.0)
Immature Granulocytes: 0 %
Lymphocytes Relative: 29 %
Lymphs Abs: 2.4 10*3/uL (ref 0.7–4.0)
MCH: 28.1 pg (ref 26.0–34.0)
MCHC: 31.9 g/dL (ref 30.0–36.0)
MCV: 88.2 fL (ref 80.0–100.0)
Monocytes Absolute: 0.5 10*3/uL (ref 0.1–1.0)
Monocytes Relative: 6 %
Neutro Abs: 5.3 10*3/uL (ref 1.7–7.7)
Neutrophils Relative %: 64 %
Platelet Count: 184 10*3/uL (ref 150–400)
RBC: 5.58 MIL/uL — ABNORMAL HIGH (ref 3.87–5.11)
RDW: 15.4 % (ref 11.5–15.5)
WBC Count: 8.4 10*3/uL (ref 4.0–10.5)
nRBC: 0 % (ref 0.0–0.2)

## 2022-08-15 LAB — CMP (CANCER CENTER ONLY)
ALT: 9 U/L (ref 0–44)
AST: 11 U/L — ABNORMAL LOW (ref 15–41)
Albumin: 3.8 g/dL (ref 3.5–5.0)
Alkaline Phosphatase: 34 U/L — ABNORMAL LOW (ref 38–126)
Anion gap: 4 — ABNORMAL LOW (ref 5–15)
BUN: 13 mg/dL (ref 8–23)
CO2: 34 mmol/L — ABNORMAL HIGH (ref 22–32)
Calcium: 9.1 mg/dL (ref 8.9–10.3)
Chloride: 100 mmol/L (ref 98–111)
Creatinine: 1.03 mg/dL — ABNORMAL HIGH (ref 0.44–1.00)
GFR, Estimated: >60 mL/min (ref >60)
Glucose, Bld: 105 mg/dL — ABNORMAL HIGH (ref 70–99)
Potassium: 3.9 mmol/L (ref 3.5–5.1)
Sodium: 138 mmol/L (ref 135–145)
Total Bilirubin: 0.5 mg/dL (ref 0.3–1.2)
Total Protein: 6.9 g/dL (ref 6.5–8.1)

## 2022-08-15 MED ORDER — DIPHENHYDRAMINE HCL 25 MG PO CAPS
50.0000 mg | ORAL_CAPSULE | Freq: Once | ORAL | Status: AC
  Administered 2022-08-15: 50 mg via ORAL
  Filled 2022-08-15: qty 2

## 2022-08-15 MED ORDER — SODIUM CHLORIDE 0.9 % IV SOLN: Freq: Once | INTRAVENOUS | Status: AC

## 2022-08-15 MED ORDER — HEPARIN SOD (PORK) LOCK FLUSH 100 UNIT/ML IV SOLN
500.0000 [IU] | Freq: Once | INTRAVENOUS | Status: AC | PRN
  Administered 2022-08-15: 500 [IU]

## 2022-08-15 MED ORDER — ACETAMINOPHEN 325 MG PO TABS
650.0000 mg | ORAL_TABLET | Freq: Once | ORAL | Status: AC
  Administered 2022-08-15: 650 mg via ORAL
  Filled 2022-08-15: qty 2

## 2022-08-15 MED ORDER — TRASTUZUMAB-DKST CHEMO 150 MG IV SOLR
6.0000 mg/kg | Freq: Once | INTRAVENOUS | Status: AC
  Administered 2022-08-15: 630 mg via INTRAVENOUS
  Filled 2022-08-15: qty 30

## 2022-08-15 MED ORDER — SODIUM CHLORIDE 0.9% FLUSH
10.0000 mL | INTRAVENOUS | Status: DC | PRN
  Administered 2022-08-15: 10 mL

## 2022-08-15 NOTE — Patient Instructions (Signed)
[name]  Discharge Instructions: Thank you for choosing [facility] to provide your oncology and hematology care.   If you have a lab appointment with the Cancer Center, please go directly to the Cancer Center and check in at the registration area.   Wear comfortable clothing and clothing appropriate for easy access to any Portacath or PICC line.   We strive to give you quality time with your provider. You may need to reschedule your appointment if you arrive late (15 or more minutes).  Arriving late affects you and other patients whose appointments are after yours.  Also, if you miss three or more appointments without notifying the office, you may be dismissed from the clinic at the provider's discretion.      For prescription refill requests, have your pharmacy contact our office and allow 72 hours for refills to be completed.    Today you received the following chemotherapy and/or immunotherapy agent: Ogivri      To help prevent nausea and vomiting after your treatment, we encourage you to take your nausea medication as directed.  BELOW ARE SYMPTOMS THAT SHOULD BE REPORTED IMMEDIATELY: *FEVER GREATER THAN 100.4 F (38 C) OR HIGHER *CHILLS OR SWEATING *NAUSEA AND VOMITING THAT IS NOT CONTROLLED WITH YOUR NAUSEA MEDICATION *UNUSUAL SHORTNESS OF BREATH *UNUSUAL BRUISING OR BLEEDING *URINARY PROBLEMS (pain or burning when urinating, or frequent urination) *BOWEL PROBLEMS (unusual diarrhea, constipation, pain near the anus) TENDERNESS IN MOUTH AND THROAT WITH OR WITHOUT PRESENCE OF ULCERS (sore throat, sores in mouth, or a toothache) UNUSUAL RASH, SWELLING OR PAIN  UNUSUAL VAGINAL DISCHARGE OR ITCHING   Items with * indicate a potential emergency and should be followed up as soon as possible or go to the Emergency Department if any problems should occur.  Please show the CHEMOTHERAPY ALERT CARD or IMMUNOTHERAPY ALERT CARD at check-in to the  Emergency Department and triage nurse.  Should you have questions after your visit or need to cancel or reschedule your appointment, please contact [facility]  Dept: [phone]  and follow the prompts.  Office hours are 8:00 a.m. to 4:30 p.m. Monday - Friday. Please note that voicemails left after 4:00 p.m. may not be returned until the following business day.  We are closed weekends and major holidays. You have access to a nurse at all times for urgent questions. Please call the main number to the clinic Dept: [phone] and follow the prompts.   For any non-urgent questions, you may also contact your provider using MyChart. We now offer e-Visits for anyone 18 and older to request care online for non-urgent symptoms. For details visit [email or web address].   Also download the MyChart app! Go to the app store, search "MyChart", open the app, select [facility], and log in with your MyChart username and password.  Masks are optional in the cancer centers. If you would like for your care team to wear a mask while they are taking care of you, please let them know. You may have one support April [name] who is at least 64 years old accompany you for your appointments. 

## 2022-08-17 ENCOUNTER — Encounter: Payer: Self-pay | Admitting: Hematology and Oncology

## 2022-08-17 NOTE — Progress Notes (Signed)
Patient Care Team: [name], MD as PCP - General (Family Medicine) [name], RN as Oncology Nurse Navigator [name], RN as Oncology Nurse Navigator [name], MD as Consulting Physician (General Surgery) [name], MD as Consulting Physician (Hematology and Oncology) [name], MD as Consulting Physician (Radiation Oncology)  DIAGNOSIS:  Encounter Diagnoses  Name Primary?   Bilateral malignant neoplasm of breast in female, estrogen receptor positive, unspecified site of breast ([place])    Smoker Yes    SUMMARY OF ONCOLOGIC HISTORY: Oncology History  Ductal carcinoma in situ (DCIS) of right breast  06/28/2021 Initial Diagnosis   Palpable mass in the left breast. Diagnostic mammogram and [place]: suspicious 1.7 cm mass in the left breast at the 1 o'clock position and 2 subtle areas of distortion in the upper outer right breast.  Left breast biopsy: Grade 3 IDC high grade focal DCIS Her2+/ER+(90%)/PR+(70%) in the left breast, and DCIS involving sclerosing adenosis with calcifications ER+(100%)/PR+(70%) in the right breast.    07/08/2021 Initial Diagnosis   Ductal carcinoma in situ (DCIS) of right breast   08/19/2021 Surgery   Right mastectomy: Foci of intermediate to high-grade DCIS, sclerosing adenosis and complex sclerosing lesion, margins negative, 0/4 lymph nodes negative, ER 100%, PR 70% Left mastectomy: 2.5 cm grade 3 IDC with DCIS high-grade, margins negative, 0/4 lymph nodes negative ER 90%, PR 70%, HER2 positive, Ki-67 40%   Malignant neoplasm of upper-outer quadrant of left breast in female, estrogen receptor positive ([place])  06/28/2021 Initial Diagnosis   Palpable mass in the left breast. Diagnostic mammogram and [place]: suspicious 1.7 cm mass in the left breast at the 1 o'clock position and 2 subtle areas of distortion in the upper outer right breast.  Left breast biopsy: Grade 3 IDC high grade focal DCIS Her2+/ER+(90%)/PR+(70%) in the left breast, and DCIS  involving sclerosing adenosis with calcifications ER+(100%)/PR+(70%) in the right breast.    07/10/2021 Cancer Staging   Staging form: Breast, AJCC 8th Edition - Clinical stage from 07/10/2021: Stage IA (cT1c, cN0, cM0, G3, ER+, PR+, HER2+) - Signed by [name], MD on 07/10/2021 Stage prefix: Initial diagnosis Histologic grading system: 3 grade system    Genetic Testing   [facility] CustomNext (47 gene) Panel was negative. No pathogenic variants were identified. The report date is 07/23/2021.  The CustomNext-Cancer+RNAinsight panel offered by [facility] includes sequencing and rearrangement analysis for the following 47 genes:  APC, ATM, AXIN2, BARD1, BMPR1A, BRCA1, BRCA2, BRIP1, CDH1, CDK4, CDKN2A, CHEK2, DICER1, EPCAM, GREM1, HOXB13, MEN1, MLH1, MSH2, MSH3, MSH6, MUTYH, NBN, NF1, NF2, NTHL1, PALB2, PMS2, POLD1, POLE, PTEN, RAD51C, RAD51D, RECQL, RET, SDHA, SDHAF2, SDHB, SDHC, SDHD, SMAD4, SMARCA4, STK11, TP53, TSC1, TSC2, and VHL.  RNA data is routinely analyzed for use in variant interpretation for all genes.   Bilateral breast cancer ([place])  08/19/2021 Initial Diagnosis   Bilateral breast cancer ([place])   10/02/2021 - 07/24/2022 Chemotherapy   Patient is on Treatment Plan : BREAST Trastuzumab q21d x 13 cycles     10/02/2021 -  Chemotherapy   Patient is on Treatment Plan : BREAST Trastuzumab IV (8/6) or SQ (600) D1 q21d       CHIEF COMPLIANT: Herceptin follow-up on Letrozole    INTERVAL HISTORY: April [name] is a 66 y.o. with above-mentioned history of bilateral breast cancer having undergone bilateral mastectomies, She presents to the clinic for a follow-up. She reports her energy levels are fairly stable. She does experience occasional episodes of nausea after treatment  lasting 3-4 days. She takes phenergan as needed with relief. She reports intermittent episodes of diarrhea versus constipation. She takes OTC supportive medications as needed with improvement. She denies easy bruising or  signs of active bleeding. She has stable shortness of breath mainly with exertion such as going up the stairs. She denies fevers, chills, sweats, chest pain or cough. She has no other complaints.   Rest of the review of systems was reviewed and negative.    ALLERGIES:  has No Known Allergies.  MEDICATIONS:  Current Outpatient Medications  Medication Sig Dispense Refill   acetaminophen (TYLENOL) 500 MG tablet Take 1,000 mg by mouth every 8 (eight) hours as needed for moderate pain.     albuterol (VENTOLIN HFA) 108 (90 Base) MCG/ACT inhaler Inhale 2 puffs into the lungs every 6 (six) hours as needed for wheezing or shortness of breath.     aspirin-acetaminophen-caffeine (EXCEDRIN MIGRAINE) 250-250-65 MG per tablet Take 2 tablets by mouth every 6 (six) hours as needed for migraine.      clonazePAM (KLONOPIN) 0.5 MG tablet Take 0.5 mg by mouth in the morning, at noon, and at bedtime.  5   clopidogrel (PLAVIX) 75 MG tablet Take 75 mg by mouth daily.     fluticasone (FLONASE) 50 MCG/ACT nasal spray Place 2 sprays into both nostrils daily as needed for allergies or rhinitis.     gabapentin (NEURONTIN) 600 MG tablet Take 600 mg by mouth 3 (three) times daily as needed.     HYDROcodone-acetaminophen (NORCO/VICODIN) 5-325 MG tablet Take 1 tablet by mouth daily as needed.     letrozole (FEMARA) 2.5 MG tablet Take 1 tablet (2.5 mg total) by mouth daily. 90 tablet 3   montelukast (SINGULAIR) 10 MG tablet Take 10 mg by mouth daily.     pantoprazole (PROTONIX) 40 MG tablet Take 40 mg by mouth 2 (two) times daily.     promethazine (PHENERGAN) 25 MG tablet Take 25 mg by mouth every 8 (eight) hours as needed for nausea or vomiting.     simvastatin (ZOCOR) 40 MG tablet Take 40 mg by mouth daily.     sodium chloride (OCEAN) 0.65 % SOLN nasal spray Place 2 sprays into both nostrils 3 (three) times daily as needed for congestion.      tiotropium (SPIRIVA) 18 MCG inhalation capsule Place 18 mcg into inhaler and  inhale daily.     trazodone (DESYREL) 300 MG tablet Take 300 mg by mouth at bedtime.     varenicline (CHANTIX) 0.5 MG tablet Take 1 tablet (0.5 mg total) by mouth 2 (two) times daily. 60 tablet 0   venlafaxine XR (EFFEXOR-XR) 150 MG 24 hr capsule Take 150 mg by mouth daily with breakfast.     methocarbamol (ROBAXIN) 500 MG tablet Take 1 tablet (500 mg total) by mouth every 6 (six) hours as needed for muscle spasms. (Patient not taking: Reported on 08/15/2022) 30 tablet 2   nicotine (NICODERM CQ - DOSED IN MG/24 HR) 7 mg/24hr patch Place 1 patch (7 mg total) onto the skin daily. (Patient not taking: Reported on 08/12/2021) 28 patch 0   No current facility-administered medications for this visit.    PHYSICAL EXAMINATION: ECOG PERFORMANCE STATUS: 1 - Symptomatic but completely ambulatory  Vitals:   08/15/22 1339  BP: 134/68  Pulse: 88  Resp: 16  Temp: 97.7 F (36.5 C)  SpO2: 91%   Filed Weights   08/15/22 1339  Weight: 232 lb 4.8 oz (105.4 kg)  LABORATORY DATA:  I have reviewed the data as listed    Latest Ref Rng & Units 08/15/2022    1:20 PM 03/19/2022    1:51 PM 02/04/2022    4:15 PM  CMP  Glucose 70 - 99 mg/dL 105  114  117   BUN 8 - 23 mg/dL 13  13  13    Creatinine 0.44 - 1.00 mg/dL 1.03  1.04  1.07   Sodium 135 - 145 mmol/L 138  140  140   Potassium 3.5 - 5.1 mmol/L 3.9  3.9  3.7   Chloride 98 - 111 mmol/L 100  100  103   CO2 22 - 32 mmol/L 34  33  32   Calcium 8.9 - 10.3 mg/dL 9.1  9.0  8.6   Total Protein 6.5 - 8.1 g/dL 6.9  7.2  7.1   Total Bilirubin 0.3 - 1.2 mg/dL 0.5  0.5  0.3   Alkaline Phos 38 - 126 U/L 34  28  25   AST 15 - 41 U/L 11  12  14    ALT 0 - 44 U/L 9  11  10      Lab Results  Component Value Date   WBC 8.4 08/15/2022   HGB 15.7 (H) 08/15/2022   HCT 49.2 (H) 08/15/2022   MCV 88.2 08/15/2022   PLT 184 08/15/2022   NEUTROABS 5.3 08/15/2022    ASSESSMENT & PLAN:  Malignant neoplasm of upper-outer quadrant of left breast in female,  estrogen receptor positive ([place]) 08/19/2021: Bilateral mastectomies Right mastectomy: Foci of intermediate to high-grade DCIS, sclerosing adenosis and complex sclerosing lesion, margins negative, 0/4 lymph nodes negative, ER 100%, PR 70% Left mastectomy: 2.5 cm grade 3 IDC with DCIS high-grade, margins negative, 0/4 lymph nodes negative ER 90%, PR 70%, HER2 positive, Ki-67 40% --------------------------------------------------------------------------------------------------------------------------------------------- Current treatment: Herceptin started 10/02/2021 (patient rejected chemotherapy) Herceptin toxicities: None.   Letrozole Toxicities:  1.  Severe fatigue 2. weight gain: Although I believe that the weight gain is coming from her dietary habits. 3.  Hot flashes   Planned treatment duration is 7 years.   Patient is tolerating treatment well without any prohibitive toxicities. Labs from today were reviewed and require no intervention. Most recent ECHO from 07/24/2022 showed EF was 65-70%. Proceed with treatment today without any dose modifications.   Patient's O2 saturation was 91%. Review of record, O2 saturation has been chronically low. Suspect COPD due to smoking history. Patient has albuterol inhaler and is not established with pulmonologist. Sent referral to pulmonology for further evaluation.   RTC in 3 weeks with labs, f/u visit with Dr. Lindi Adie before next Herceptin treatment.   Orders Placed This Encounter  Procedures   Ambulatory referral to Pulmonology    Referral Priority:   Routine    Referral Type:   Consultation    Referral Reason:   Specialty Services Required    Requested Specialty:   Pulmonary Disease    Number of Visits Requested:   1   The patient has a good understanding of the overall plan. she agrees with it. she will call with any problems that may develop before the next visit here.  I have spent a total of 30 minutes minutes of face-to-face and  non-face-to-face time, preparing to see the patient,  performing a medically appropriate examination, counseling and educating the patient, referring and communicating with other health care professionals, documenting clinical information in the electronic health record, and care coordination.   Dede Query PA-C Dept of  Hematology and [place] at [facility] Phone: [phone]

## 2022-08-18 ENCOUNTER — Encounter: Payer: Self-pay | Admitting: *Deleted

## 2022-09-03 NOTE — Progress Notes (Signed)
Patient Care Team: [name], MD as PCP - General (Family Medicine) [name], RN as Oncology Nurse Navigator [name], RN as Oncology Nurse Navigator [name], MD as Consulting Physician (General Surgery) [name], MD as Consulting Physician (Hematology and Oncology) [name], MD as Consulting Physician (Radiation Oncology)  DIAGNOSIS: No diagnosis found.  SUMMARY OF ONCOLOGIC HISTORY: Oncology History  Ductal carcinoma in situ (DCIS) of right breast  06/28/2021 Initial Diagnosis   Palpable mass in the left breast. Diagnostic mammogram and [place]: suspicious 1.7 cm mass in the left breast at the 1 o'clock position and 2 subtle areas of distortion in the upper outer right breast.  Left breast biopsy: Grade 3 IDC high grade focal DCIS Her2+/ER+(90%)/PR+(70%) in the left breast, and DCIS involving sclerosing adenosis with calcifications ER+(100%)/PR+(70%) in the right breast.    07/08/2021 Initial Diagnosis   Ductal carcinoma in situ (DCIS) of right breast   08/19/2021 Surgery   Right mastectomy: Foci of intermediate to high-grade DCIS, sclerosing adenosis and complex sclerosing lesion, margins negative, 0/4 lymph nodes negative, ER 100%, PR 70% Left mastectomy: 2.5 cm grade 3 IDC with DCIS high-grade, margins negative, 0/4 lymph nodes negative ER 90%, PR 70%, HER2 positive, Ki-67 40%   Malignant neoplasm of upper-outer quadrant of left breast in female, estrogen receptor positive ([place])  06/28/2021 Initial Diagnosis   Palpable mass in the left breast. Diagnostic mammogram and [place]: suspicious 1.7 cm mass in the left breast at the 1 o'clock position and 2 subtle areas of distortion in the upper outer right breast.  Left breast biopsy: Grade 3 IDC high grade focal DCIS Her2+/ER+(90%)/PR+(70%) in the left breast, and DCIS involving sclerosing adenosis with calcifications ER+(100%)/PR+(70%) in the right breast.    07/10/2021 Cancer Staging   Staging form: Breast, AJCC  8th Edition - Clinical stage from 07/10/2021: Stage IA (cT1c, cN0, cM0, G3, ER+, PR+, HER2+) - Signed by [name], MD on 07/10/2021 Stage prefix: Initial diagnosis Histologic grading system: 3 grade system    Genetic Testing   [facility] CustomNext (47 gene) Panel was negative. No pathogenic variants were identified. The report date is 07/23/2021.  The CustomNext-Cancer+RNAinsight panel offered by [employer] includes sequencing and rearrangement analysis for the following 47 genes:  APC, ATM, AXIN2, BARD1, BMPR1A, BRCA1, BRCA2, BRIP1, CDH1, CDK4, CDKN2A, CHEK2, DICER1, EPCAM, GREM1, HOXB13, MEN1, MLH1, MSH2, MSH3, MSH6, MUTYH, NBN, NF1, NF2, NTHL1, PALB2, PMS2, POLD1, POLE, PTEN, RAD51C, RAD51D, RECQL, RET, SDHA, SDHAF2, SDHB, SDHC, SDHD, SMAD4, SMARCA4, STK11, TP53, TSC1, TSC2, and VHL.  RNA data is routinely analyzed for use in variant interpretation for all genes.   Bilateral breast cancer ([place])  08/19/2021 Initial Diagnosis   Bilateral breast cancer ([place])   10/02/2021 - 07/24/2022 Chemotherapy   Patient is on Treatment Plan : BREAST Trastuzumab q21d x 13 cycles     10/02/2021 -  Chemotherapy   Patient is on Treatment Plan : BREAST Trastuzumab IV (8/6) or SQ (600) D1 q21d       CHIEF COMPLIANT:   INTERVAL HISTORY: April [name] is a   ALLERGIES:  has No Known Allergies.  MEDICATIONS:  Current Outpatient Medications  Medication Sig Dispense Refill   acetaminophen (TYLENOL) 500 MG tablet Take 1,000 mg by mouth every 8 (eight) hours as needed for moderate pain.     albuterol (VENTOLIN HFA) 108 (90 Base) MCG/ACT inhaler Inhale 2 puffs into the lungs every 6 (six) hours as needed for wheezing or shortness of breath.  aspirin-acetaminophen-caffeine (EXCEDRIN MIGRAINE) 250-250-65 MG per tablet Take 2 tablets by mouth every 6 (six) hours as needed for migraine.      clonazePAM (KLONOPIN) 0.5 MG tablet Take 0.5 mg by mouth in the morning, at noon, and at bedtime.  5   clopidogrel  (PLAVIX) 75 MG tablet Take 75 mg by mouth daily.     fluticasone (FLONASE) 50 MCG/ACT nasal spray Place 2 sprays into both nostrils daily as needed for allergies or rhinitis.     gabapentin (NEURONTIN) 600 MG tablet Take 600 mg by mouth 3 (three) times daily as needed.     HYDROcodone-acetaminophen (NORCO/VICODIN) 5-325 MG tablet Take 1 tablet by mouth daily as needed.     letrozole (FEMARA) 2.5 MG tablet Take 1 tablet (2.5 mg total) by mouth daily. 90 tablet 3   montelukast (SINGULAIR) 10 MG tablet Take 10 mg by mouth daily.     pantoprazole (PROTONIX) 40 MG tablet Take 40 mg by mouth 2 (two) times daily.     promethazine (PHENERGAN) 25 MG tablet Take 25 mg by mouth every 8 (eight) hours as needed for nausea or vomiting.     simvastatin (ZOCOR) 40 MG tablet Take 40 mg by mouth daily.     sodium chloride (OCEAN) 0.65 % SOLN nasal spray Place 2 sprays into both nostrils 3 (three) times daily as needed for congestion.      tiotropium (SPIRIVA) 18 MCG inhalation capsule Place 18 mcg into inhaler and inhale daily.     trazodone (DESYREL) 300 MG tablet Take 300 mg by mouth at bedtime.     varenicline (CHANTIX) 0.5 MG tablet Take 1 tablet (0.5 mg total) by mouth 2 (two) times daily. 60 tablet 0   venlafaxine XR (EFFEXOR-XR) 150 MG 24 hr capsule Take 150 mg by mouth daily with breakfast.     No current facility-administered medications for this visit.    PHYSICAL EXAMINATION: ECOG PERFORMANCE STATUS: {CHL ONC ECOG PS:[id number]}  There were no vitals filed for this visit. There were no vitals filed for this visit.  BREAST:*** No palpable masses or nodules in either right or left breasts. No palpable axillary supraclavicular or infraclavicular adenopathy no breast tenderness or nipple discharge. (exam performed in the presence of a chaperone)  LABORATORY DATA:  I have reviewed the data as listed    Latest Ref Rng & Units 08/15/2022    1:20 PM 03/19/2022    1:51 PM 02/04/2022    4:15 PM  CMP   Glucose 70 - 99 mg/dL 105  114  117   BUN 8 - 23 mg/dL _0 Creatinine 0.44 - 1.00 mg/dL 1.03  1.04  1.07   Sodium 135 - 145 mmol/L 138  140  140   Potassium 3.5 - 5.1 mmol/L 3.9  3.9  3.7   Chloride 98 - 111 mmol/L 100  100  103   CO2 22 - 32 mmol/L 34  33  32   Calcium 8.9 - 10.3 mg/dL 9.1  9.0  8.6   Total Protein 6.5 - 8.1 g/dL 6.9  7.2  7.1   Total Bilirubin 0.3 - 1.2 mg/dL 0.5  0.5  0.3   Alkaline Phos 38 - 126 U/L 34  28  25   AST 15 - 41 U/L _1 ALT 0 - 44 U/L _2 Lab Results  Component Value Date   WBC 8.4 08/15/2022  HGB 15.7 (H) 08/15/2022   HCT 49.2 (H) 08/15/2022   MCV 88.2 08/15/2022   PLT 184 08/15/2022   NEUTROABS 5.3 08/15/2022    ASSESSMENT & PLAN:  No problem-specific Assessment & Plan notes found for this encounter.    No orders of the defined types were placed in this encounter.  The patient has a good understanding of the overall plan. she agrees with it. she will call with any problems that may develop before the next visit here. Total time spent: 30 mins including face to face time and time spent for planning, charting and co-ordination of care   Suzzette Righter, [place] 09/03/22    I Gardiner Coins am scribing for Dr. Lindi Adie  ***

## 2022-09-04 ENCOUNTER — Other Ambulatory Visit: Payer: Self-pay

## 2022-09-04 ENCOUNTER — Inpatient Hospital Stay: Payer: PPO | Attending: Hematology and Oncology | Admitting: Hematology and Oncology

## 2022-09-04 ENCOUNTER — Inpatient Hospital Stay: Payer: PPO

## 2022-09-04 ENCOUNTER — Telehealth: Payer: Self-pay | Admitting: Hematology and Oncology

## 2022-09-04 VITALS — BP 113/69 | HR 82 | Temp 97.3°F | Resp 18 | Ht 63.0 in | Wt 233.3 lb

## 2022-09-04 DIAGNOSIS — C50912 Malignant neoplasm of unspecified site of left female breast: Secondary | ICD-10-CM

## 2022-09-04 DIAGNOSIS — Z5112 Encounter for antineoplastic immunotherapy: Secondary | ICD-10-CM | POA: Diagnosis not present

## 2022-09-04 DIAGNOSIS — Z170 Estrogen receptor positive status [ER+]: Secondary | ICD-10-CM | POA: Insufficient documentation

## 2022-09-04 DIAGNOSIS — C50911 Malignant neoplasm of unspecified site of right female breast: Secondary | ICD-10-CM | POA: Diagnosis not present

## 2022-09-04 DIAGNOSIS — Z79811 Long term (current) use of aromatase inhibitors: Secondary | ICD-10-CM | POA: Diagnosis not present

## 2022-09-04 DIAGNOSIS — D0511 Intraductal carcinoma in situ of right breast: Secondary | ICD-10-CM | POA: Diagnosis not present

## 2022-09-04 DIAGNOSIS — C50412 Malignant neoplasm of upper-outer quadrant of left female breast: Secondary | ICD-10-CM | POA: Diagnosis not present

## 2022-09-04 DIAGNOSIS — Z9013 Acquired absence of bilateral breasts and nipples: Secondary | ICD-10-CM | POA: Diagnosis not present

## 2022-09-04 DIAGNOSIS — Z79899 Other long term (current) drug therapy: Secondary | ICD-10-CM | POA: Diagnosis not present

## 2022-09-04 MED ORDER — SODIUM CHLORIDE 0.9 % IV SOLN: Freq: Once | INTRAVENOUS | Status: AC

## 2022-09-04 MED ORDER — HEPARIN SOD (PORK) LOCK FLUSH 100 UNIT/ML IV SOLN
500.0000 [IU] | Freq: Once | INTRAVENOUS | Status: AC | PRN
  Administered 2022-09-04: 500 [IU]

## 2022-09-04 MED ORDER — DIPHENHYDRAMINE HCL 25 MG PO CAPS
50.0000 mg | ORAL_CAPSULE | Freq: Once | ORAL | Status: AC
  Administered 2022-09-04: 50 mg via ORAL
  Filled 2022-09-04: qty 2

## 2022-09-04 MED ORDER — ACETAMINOPHEN 325 MG PO TABS
650.0000 mg | ORAL_TABLET | Freq: Once | ORAL | Status: AC
  Administered 2022-09-04: 650 mg via ORAL
  Filled 2022-09-04: qty 2

## 2022-09-04 MED ORDER — TRASTUZUMAB-DKST CHEMO 150 MG IV SOLR
6.0000 mg/kg | Freq: Once | INTRAVENOUS | Status: AC
  Administered 2022-09-04: 630 mg via INTRAVENOUS
  Filled 2022-09-04: qty 30

## 2022-09-04 MED ORDER — SODIUM CHLORIDE 0.9% FLUSH
10.0000 mL | INTRAVENOUS | Status: DC | PRN
  Administered 2022-09-04: 10 mL

## 2022-09-04 NOTE — Assessment & Plan Note (Addendum)
08/19/2021: Bilateral mastectomies Right mastectomy: Foci of intermediate to high-grade DCIS, sclerosing adenosis and complex sclerosing lesion, margins negative, 0/4 lymph nodes negative, ER 100%, PR 70% Left mastectomy: 2.5 cm grade 3 IDC with DCIS high-grade, margins negative, 0/4 lymph nodes negative ER 90%, PR 70%, HER2 positive, Ki-67 40% --------------------------------------------------------------------------------------------------------------------------------------------- Current treatment: Herceptin started 10/02/2021 (patient rejected chemotherapy) She completes 1 year of Herceptin by 09/25/2022. Herceptin toxicities: None.   Letrozole Toxicities:  1.  Severe fatigue 2. weight gain: Although I believe that the weight gain is coming from her dietary habits. 3.  Hot flashes    Breast cancer surveillance: No role of mammograms since she had bilateral mastectomies.  Return to clinic in 6 months for follow-up with me.  After that we can see her once a year.

## 2022-09-04 NOTE — Telephone Encounter (Signed)
Scheduled appointment per 11/9 los. Left voicemail.

## 2022-09-05 ENCOUNTER — Other Ambulatory Visit: Payer: Self-pay

## 2022-09-23 NOTE — Progress Notes (Signed)
Patient Care Team: [name], MD as PCP - General (Family Medicine) [name], RN as Oncology Nurse Navigator [name], RN as Oncology Nurse Navigator [name], MD as Consulting Physician (General Surgery) [name], MD as Consulting Physician (Hematology and Oncology) [name], MD as Consulting Physician (Radiation Oncology)  DIAGNOSIS: No diagnosis found.  SUMMARY OF ONCOLOGIC HISTORY: Oncology History  Ductal carcinoma in situ (DCIS) of right breast  06/28/2021 Initial Diagnosis   Palpable mass in the left breast. Diagnostic mammogram and [place]: suspicious 1.7 cm mass in the left breast at the 1 o'clock position and 2 subtle areas of distortion in the upper outer right breast.  Left breast biopsy: Grade 3 IDC high grade focal DCIS Her2+/ER+(90%)/PR+(70%) in the left breast, and DCIS involving sclerosing adenosis with calcifications ER+(100%)/PR+(70%) in the right breast.    07/08/2021 Initial Diagnosis   Ductal carcinoma in situ (DCIS) of right breast   08/19/2021 Surgery   Right mastectomy: Foci of intermediate to high-grade DCIS, sclerosing adenosis and complex sclerosing lesion, margins negative, 0/4 lymph nodes negative, ER 100%, PR 70% Left mastectomy: 2.5 cm grade 3 IDC with DCIS high-grade, margins negative, 0/4 lymph nodes negative ER 90%, PR 70%, HER2 positive, Ki-67 40%   Malignant neoplasm of upper-outer quadrant of left breast in female, estrogen receptor positive ([place])  06/28/2021 Initial Diagnosis   Palpable mass in the left breast. Diagnostic mammogram and [place]: suspicious 1.7 cm mass in the left breast at the 1 o'clock position and 2 subtle areas of distortion in the upper outer right breast.  Left breast biopsy: Grade 3 IDC high grade focal DCIS Her2+/ER+(90%)/PR+(70%) in the left breast, and DCIS involving sclerosing adenosis with calcifications ER+(100%)/PR+(70%) in the right breast.    07/10/2021 Cancer Staging   Staging form: Breast, AJCC  8th Edition - Clinical stage from 07/10/2021: Stage IA (cT1c, cN0, cM0, G3, ER+, PR+, HER2+) - Signed by [name], MD on 07/10/2021 Stage prefix: Initial diagnosis Histologic grading system: 3 grade system    Genetic Testing   [facility] CustomNext (47 gene) Panel was negative. No pathogenic variants were identified. The report date is 07/23/2021.  The CustomNext-Cancer+RNAinsight panel offered by [facility] includes sequencing and rearrangement analysis for the following 47 genes:  APC, ATM, AXIN2, BARD1, BMPR1A, BRCA1, BRCA2, BRIP1, CDH1, CDK4, CDKN2A, CHEK2, DICER1, EPCAM, GREM1, HOXB13, MEN1, MLH1, MSH2, MSH3, MSH6, MUTYH, NBN, NF1, NF2, NTHL1, PALB2, PMS2, POLD1, POLE, PTEN, RAD51C, RAD51D, RECQL, RET, SDHA, SDHAF2, SDHB, SDHC, SDHD, SMAD4, SMARCA4, STK11, TP53, TSC1, TSC2, and VHL.  RNA data is routinely analyzed for use in variant interpretation for all genes.   Bilateral breast cancer ([place])  08/19/2021 Initial Diagnosis   Bilateral breast cancer ([place])   10/02/2021 - 07/24/2022 Chemotherapy   Patient is on Treatment Plan : BREAST Trastuzumab q21d x 13 cycles     10/02/2021 -  Chemotherapy   Patient is on Treatment Plan : BREAST Trastuzumab IV (8/6) or SQ (600) D1 q21d       CHIEF COMPLIANT: Follow-up bilateral breast cancer on herceptin     INTERVAL HISTORY: April [name] is a 66 y.o. with above-mentioned history of bilateral breast cancer having undergone bilateral mastectomies, currently on herceptin and letrozole.  She presents to the clinic for a follow-up.   ALLERGIES:  has No Known Allergies.  MEDICATIONS:  Current Outpatient Medications  Medication Sig Dispense Refill   acetaminophen (TYLENOL) 500 MG tablet Take 1,000 mg by mouth every 8 (eight) hours  as needed for moderate pain.     albuterol (VENTOLIN HFA) 108 (90 Base) MCG/ACT inhaler Inhale 2 puffs into the lungs every 6 (six) hours as needed for wheezing or shortness of breath.     aspirin-acetaminophen-caffeine  (EXCEDRIN MIGRAINE) 250-250-65 MG per tablet Take 2 tablets by mouth every 6 (six) hours as needed for migraine.      clonazePAM (KLONOPIN) 0.5 MG tablet Take 0.5 mg by mouth in the morning, at noon, and at bedtime.  5   clopidogrel (PLAVIX) 75 MG tablet Take 75 mg by mouth daily.     fluticasone (FLONASE) 50 MCG/ACT nasal spray Place 2 sprays into both nostrils daily as needed for allergies or rhinitis.     fluticasone-salmeterol (ADVAIR) 100-50 MCG/ACT AEPB 1 puff.     gabapentin (NEURONTIN) 600 MG tablet Take 600 mg by mouth 3 (three) times daily as needed.     HYDROcodone-acetaminophen (NORCO/VICODIN) 5-325 MG tablet Take 1 tablet by mouth daily as needed.     letrozole (FEMARA) 2.5 MG tablet Take 1 tablet (2.5 mg total) by mouth daily. 90 tablet 3   montelukast (SINGULAIR) 10 MG tablet Take 10 mg by mouth daily.     pantoprazole (PROTONIX) 40 MG tablet Take 40 mg by mouth 2 (two) times daily.     promethazine (PHENERGAN) 25 MG tablet Take 25 mg by mouth every 8 (eight) hours as needed for nausea or vomiting.     simvastatin (ZOCOR) 40 MG tablet Take 40 mg by mouth daily.     sodium chloride (OCEAN) 0.65 % SOLN nasal spray Place 2 sprays into both nostrils 3 (three) times daily as needed for congestion.      tiotropium (SPIRIVA) 18 MCG inhalation capsule Place 18 mcg into inhaler and inhale daily.     trazodone (DESYREL) 300 MG tablet Take 300 mg by mouth at bedtime.     varenicline (CHANTIX) 0.5 MG tablet Take 1 tablet (0.5 mg total) by mouth 2 (two) times daily. 60 tablet 0   venlafaxine XR (EFFEXOR-XR) 150 MG 24 hr capsule Take 150 mg by mouth daily with breakfast.     No current facility-administered medications for this visit.    PHYSICAL EXAMINATION: ECOG PERFORMANCE STATUS: {CHL ONC ECOG PS:[id number]}  There were no vitals filed for this visit. There were no vitals filed for this visit.  BREAST:*** No palpable masses or nodules in either right or left breasts. No palpable  axillary supraclavicular or infraclavicular adenopathy no breast tenderness or nipple discharge. (exam performed in the presence of a chaperone)  LABORATORY DATA:  I have reviewed the data as listed    Latest Ref Rng & Units 08/15/2022    1:20 PM 03/19/2022    1:51 PM 02/04/2022    4:15 PM  CMP  Glucose 70 - 99 mg/dL 105  114  117   BUN 8 - 23 mg/dL _0 Creatinine 0.44 - 1.00 mg/dL 1.03  1.04  1.07   Sodium 135 - 145 mmol/L 138  140  140   Potassium 3.5 - 5.1 mmol/L 3.9  3.9  3.7   Chloride 98 - 111 mmol/L 100  100  103   CO2 22 - 32 mmol/L 34  33  32   Calcium 8.9 - 10.3 mg/dL 9.1  9.0  8.6   Total Protein 6.5 - 8.1 g/dL 6.9  7.2  7.1   Total Bilirubin 0.3 - 1.2 mg/dL 0.5  0.5  0.3  Alkaline Phos 38 - 126 U/L 34  28  25   AST 15 - 41 U/L _0 ALT 0 - 44 U/L _1 Lab Results  Component Value Date   WBC 8.4 08/15/2022   HGB 15.7 (H) 08/15/2022   HCT 49.2 (H) 08/15/2022   MCV 88.2 08/15/2022   PLT 184 08/15/2022   NEUTROABS 5.3 08/15/2022    ASSESSMENT & PLAN:  No problem-specific Assessment & Plan notes found for this encounter.    No orders of the defined types were placed in this encounter.  The patient has a good understanding of the overall plan. she agrees with it. she will call with any problems that may develop before the next visit here. Total time spent: 30 mins including face to face time and time spent for planning, charting and co-ordination of care   Suzzette Righter, [place] 09/23/22    I Gardiner Coins am scribing for Dr. Lindi Adie  ***

## 2022-09-25 ENCOUNTER — Encounter: Payer: Self-pay | Admitting: *Deleted

## 2022-09-25 ENCOUNTER — Inpatient Hospital Stay: Payer: PPO

## 2022-09-25 ENCOUNTER — Other Ambulatory Visit: Payer: Self-pay

## 2022-09-25 ENCOUNTER — Inpatient Hospital Stay (HOSPITAL_BASED_OUTPATIENT_CLINIC_OR_DEPARTMENT_OTHER): Payer: PPO | Admitting: Hematology and Oncology

## 2022-09-25 VITALS — BP 146/55 | HR 83 | Temp 97.3°F | Resp 18 | Ht 63.0 in | Wt 234.7 lb

## 2022-09-25 DIAGNOSIS — C50911 Malignant neoplasm of unspecified site of right female breast: Secondary | ICD-10-CM

## 2022-09-25 DIAGNOSIS — Z5112 Encounter for antineoplastic immunotherapy: Secondary | ICD-10-CM | POA: Diagnosis not present

## 2022-09-25 DIAGNOSIS — Z170 Estrogen receptor positive status [ER+]: Secondary | ICD-10-CM | POA: Diagnosis not present

## 2022-09-25 DIAGNOSIS — C50412 Malignant neoplasm of upper-outer quadrant of left female breast: Secondary | ICD-10-CM

## 2022-09-25 DIAGNOSIS — C50912 Malignant neoplasm of unspecified site of left female breast: Secondary | ICD-10-CM

## 2022-09-25 MED ORDER — SODIUM CHLORIDE 0.9 % IV SOLN: Freq: Once | INTRAVENOUS | Status: AC

## 2022-09-25 MED ORDER — LETROZOLE 2.5 MG PO TABS: 2.5000 mg | ORAL_TABLET | Freq: Every day | ORAL | 3 refills | Status: DC

## 2022-09-25 MED ORDER — TRASTUZUMAB-DKST CHEMO 150 MG IV SOLR
6.0000 mg/kg | Freq: Once | INTRAVENOUS | Status: AC
  Administered 2022-09-25: 630 mg via INTRAVENOUS
  Filled 2022-09-25: qty 30

## 2022-09-25 MED ORDER — HEPARIN SOD (PORK) LOCK FLUSH 100 UNIT/ML IV SOLN
500.0000 [IU] | Freq: Once | INTRAVENOUS | Status: AC | PRN
  Administered 2022-09-25: 500 [IU]

## 2022-09-25 MED ORDER — ACETAMINOPHEN 325 MG PO TABS
650.0000 mg | ORAL_TABLET | Freq: Once | ORAL | Status: AC
  Administered 2022-09-25: 650 mg via ORAL
  Filled 2022-09-25: qty 2

## 2022-09-25 MED ORDER — DIPHENHYDRAMINE HCL 25 MG PO CAPS
50.0000 mg | ORAL_CAPSULE | Freq: Once | ORAL | Status: AC
  Administered 2022-09-25: 50 mg via ORAL
  Filled 2022-09-25: qty 2

## 2022-09-25 MED ORDER — SODIUM CHLORIDE 0.9% FLUSH
10.0000 mL | INTRAVENOUS | Status: DC | PRN
  Administered 2022-09-25: 10 mL

## 2022-09-25 NOTE — Assessment & Plan Note (Signed)
08/19/2021: Bilateral mastectomies Right mastectomy: Foci of intermediate to high-grade DCIS, sclerosing adenosis and complex sclerosing lesion, margins negative, 0/4 lymph nodes negative, ER 100%, PR 70% Left mastectomy: 2.5 cm grade 3 IDC with DCIS high-grade, margins negative, 0/4 lymph nodes negative ER 90%, PR 70%, HER2 positive, Ki-67 40% --------------------------------------------------------------------------------------------------------------------------------------------- Current treatment: Herceptin 10/02/2021-09/25/2022 (patient rejected chemotherapy)   Herceptin toxicities: None.   Letrozole Toxicities:  1.  Severe fatigue 2. weight gain: Discussed weight loss 3.  Hot flashes: Stable     Breast cancer surveillance: No role of mammograms since she had bilateral mastectomies.   Return to clinic in 1 year for follow-up

## 2022-09-25 NOTE — Patient Instructions (Signed)
[facility]  Discharge Instructions: Thank you for choosing [facility] to provide your oncology and hematology care.   If you have a lab appointment with the [facility], please go directly to the [facility] and check in at the registration area.   Wear comfortable clothing and clothing appropriate for easy access to any Portacath or PICC line.   We strive to give you quality time with your provider. You may need to reschedule your appointment if you arrive late (15 or more minutes).  Arriving late affects you and other patients whose appointments are after yours.  Also, if you miss three or more appointments without notifying the office, you may be dismissed from the clinic at the provider's discretion.      For prescription refill requests, have your pharmacy contact our office and allow 72 hours for refills to be completed.    Today you received the following chemotherapy and/or immunotherapy agent: Ogivri      To help prevent nausea and vomiting after your treatment, we encourage you to take your nausea medication as directed.  BELOW ARE SYMPTOMS THAT SHOULD BE REPORTED IMMEDIATELY: *FEVER GREATER THAN 100.4 F (38 C) OR HIGHER *CHILLS OR SWEATING *NAUSEA AND VOMITING THAT IS NOT CONTROLLED WITH YOUR NAUSEA MEDICATION *UNUSUAL SHORTNESS OF BREATH *UNUSUAL BRUISING OR BLEEDING *URINARY PROBLEMS (pain or burning when urinating, or frequent urination) *BOWEL PROBLEMS (unusual diarrhea, constipation, pain near the anus) TENDERNESS IN MOUTH AND THROAT WITH OR WITHOUT PRESENCE OF ULCERS (sore throat, sores in mouth, or a toothache) UNUSUAL RASH, SWELLING OR PAIN  UNUSUAL VAGINAL DISCHARGE OR ITCHING   Items with * indicate a potential emergency and should be followed up as soon as possible or go to the Emergency Department if any problems should occur.  Please show the CHEMOTHERAPY ALERT CARD or IMMUNOTHERAPY ALERT CARD at check-in to the  Emergency Department and triage nurse.  Should you have questions after your visit or need to cancel or reschedule your appointment, please contact [facility]  Dept: [phone]  and follow the prompts.  Office hours are 8:00 a.m. to 4:30 p.m. Monday - Friday. Please note that voicemails left after 4:00 p.m. may not be returned until the following business day.  We are closed weekends and major holidays. You have access to a nurse at all times for urgent questions. Please call the main number to the clinic Dept: [phone] and follow the prompts.   For any non-urgent questions, you may also contact your provider using MyChart. We now offer e-Visits for anyone 42 and older to request care online for non-urgent symptoms. For details visit [email or web address].   Also download the MyChart app! Go to the app store, search "MyChart", open the app, select [facility], and log in with your MyChart username and password.  Masks are optional in the cancer centers. If you would like for your care team to wear a mask while they are taking care of you, please let them know. You may have one support April [name] who is at least 64 years old accompany you for your appointments.

## 2022-09-27 ENCOUNTER — Other Ambulatory Visit: Payer: Self-pay

## 2022-09-30 ENCOUNTER — Emergency Department (HOSPITAL_COMMUNITY): Payer: PPO

## 2022-09-30 ENCOUNTER — Other Ambulatory Visit: Payer: Self-pay

## 2022-09-30 ENCOUNTER — Telehealth: Payer: Self-pay | Admitting: Hematology and Oncology

## 2022-09-30 ENCOUNTER — Encounter (HOSPITAL_COMMUNITY): Payer: Self-pay | Admitting: Emergency Medicine

## 2022-09-30 ENCOUNTER — Inpatient Hospital Stay (HOSPITAL_COMMUNITY)
Admission: EM | Admit: 2022-09-30 | Discharge: 2022-10-02 | DRG: 190 | Disposition: A | Discharge status: Home Health | Payer: PPO | Attending: Internal Medicine | Admitting: Internal Medicine

## 2022-09-30 DIAGNOSIS — S0990XA Unspecified injury of head, initial encounter: Secondary | ICD-10-CM

## 2022-09-30 DIAGNOSIS — I110 Hypertensive heart disease with heart failure: Secondary | ICD-10-CM | POA: Diagnosis present

## 2022-09-30 DIAGNOSIS — J9601 Acute respiratory failure with hypoxia: Secondary | ICD-10-CM | POA: Diagnosis present

## 2022-09-30 DIAGNOSIS — F419 Anxiety disorder, unspecified: Secondary | ICD-10-CM | POA: Diagnosis present

## 2022-09-30 DIAGNOSIS — J9611 Chronic respiratory failure with hypoxia: Secondary | ICD-10-CM | POA: Diagnosis present

## 2022-09-30 DIAGNOSIS — Z803 Family history of malignant neoplasm of breast: Secondary | ICD-10-CM

## 2022-09-30 DIAGNOSIS — E782 Mixed hyperlipidemia: Secondary | ICD-10-CM | POA: Insufficient documentation

## 2022-09-30 DIAGNOSIS — J449 Chronic obstructive pulmonary disease, unspecified: Secondary | ICD-10-CM | POA: Diagnosis present

## 2022-09-30 DIAGNOSIS — Z7902 Long term (current) use of antithrombotics/antiplatelets: Secondary | ICD-10-CM

## 2022-09-30 DIAGNOSIS — F17210 Nicotine dependence, cigarettes, uncomplicated: Secondary | ICD-10-CM | POA: Diagnosis present

## 2022-09-30 DIAGNOSIS — I5032 Chronic diastolic (congestive) heart failure: Secondary | ICD-10-CM | POA: Diagnosis present

## 2022-09-30 DIAGNOSIS — S161XXA Strain of muscle, fascia and tendon at neck level, initial encounter: Secondary | ICD-10-CM | POA: Diagnosis present

## 2022-09-30 DIAGNOSIS — Z7951 Long term (current) use of inhaled steroids: Secondary | ICD-10-CM

## 2022-09-30 DIAGNOSIS — J441 Chronic obstructive pulmonary disease with (acute) exacerbation: Principal | ICD-10-CM | POA: Diagnosis present

## 2022-09-30 DIAGNOSIS — Z853 Personal history of malignant neoplasm of breast: Secondary | ICD-10-CM

## 2022-09-30 DIAGNOSIS — Z8249 Family history of ischemic heart disease and other diseases of the circulatory system: Secondary | ICD-10-CM

## 2022-09-30 DIAGNOSIS — D0511 Intraductal carcinoma in situ of right breast: Secondary | ICD-10-CM | POA: Diagnosis present

## 2022-09-30 DIAGNOSIS — I700 Atherosclerosis of aorta: Secondary | ICD-10-CM | POA: Diagnosis present

## 2022-09-30 DIAGNOSIS — Z8673 Personal history of transient ischemic attack (TIA), and cerebral infarction without residual deficits: Secondary | ICD-10-CM

## 2022-09-30 DIAGNOSIS — K219 Gastro-esophageal reflux disease without esophagitis: Secondary | ICD-10-CM | POA: Diagnosis present

## 2022-09-30 DIAGNOSIS — Y92410 Unspecified street and highway as the place of occurrence of the external cause: Secondary | ICD-10-CM

## 2022-09-30 DIAGNOSIS — D509 Iron deficiency anemia, unspecified: Secondary | ICD-10-CM | POA: Insufficient documentation

## 2022-09-30 DIAGNOSIS — F32A Depression, unspecified: Secondary | ICD-10-CM | POA: Diagnosis present

## 2022-09-30 DIAGNOSIS — J9602 Acute respiratory failure with hypercapnia: Secondary | ICD-10-CM

## 2022-09-30 DIAGNOSIS — Z6841 Body Mass Index (BMI) 40.0 and over, adult: Secondary | ICD-10-CM

## 2022-09-30 DIAGNOSIS — Z79899 Other long term (current) drug therapy: Secondary | ICD-10-CM

## 2022-09-30 DIAGNOSIS — J9621 Acute and chronic respiratory failure with hypoxia: Secondary | ICD-10-CM | POA: Diagnosis present

## 2022-09-30 DIAGNOSIS — S20211A Contusion of right front wall of thorax, initial encounter: Secondary | ICD-10-CM | POA: Diagnosis present

## 2022-09-30 DIAGNOSIS — I2510 Atherosclerotic heart disease of native coronary artery without angina pectoris: Secondary | ICD-10-CM | POA: Diagnosis present

## 2022-09-30 DIAGNOSIS — J9622 Acute and chronic respiratory failure with hypercapnia: Secondary | ICD-10-CM | POA: Diagnosis present

## 2022-09-30 LAB — BASIC METABOLIC PANEL
Anion gap: 7 (ref 5–15)
BUN: 10 mg/dL (ref 8–23)
CO2: 35 mmol/L — ABNORMAL HIGH (ref 22–32)
Calcium: 8.4 mg/dL — ABNORMAL LOW (ref 8.9–10.3)
Chloride: 100 mmol/L (ref 98–111)
Creatinine, Ser: 0.92 mg/dL (ref 0.44–1.00)
GFR, Estimated: >60 mL/min (ref >60)
Glucose, Bld: 107 mg/dL — ABNORMAL HIGH (ref 70–99)
Potassium: 3.9 mmol/L (ref 3.5–5.1)
Sodium: 142 mmol/L (ref 135–145)

## 2022-09-30 LAB — BLOOD GAS, ARTERIAL
Acid-Base Excess: 16.2 mmol/L — ABNORMAL HIGH (ref 0.0–2.0)
Acid-Base Excess: 10.4 mmol/L — ABNORMAL HIGH (ref 0.0–2.0)
Acid-Base Excess: 11.5 mmol/L — ABNORMAL HIGH (ref 0.0–2.0)
Bicarbonate: 46.2 mmol/L — ABNORMAL HIGH (ref 20.0–28.0)
Bicarbonate: 40.3 mmol/L — ABNORMAL HIGH (ref 20.0–28.0)
Bicarbonate: 42.3 mmol/L — ABNORMAL HIGH (ref 20.0–28.0)
Drawn by: 22766
Drawn by: 21310
Drawn by: 21310
FIO2: 21 %
O2 Saturation: 79.1 %
O2 Saturation: 77.7 %
O2 Saturation: 92.6 %
Patient temperature: 36.9
Patient temperature: 37.1
Patient temperature: 37.1
pCO2 arterial: 80 mmHg (ref 32–48)
pCO2 arterial: 80 mmHg (ref 32–48)
pCO2 arterial: 90 mmHg (ref 32–48)
pH, Arterial: 7.37 (ref 7.35–7.45)
pH, Arterial: 7.28 — ABNORMAL LOW (ref 7.35–7.45)
pH, Arterial: 7.31 — ABNORMAL LOW (ref 7.35–7.45)
pO2, Arterial: 46 mmHg — ABNORMAL LOW (ref 83–108)
pO2, Arterial: 47 mmHg — ABNORMAL LOW (ref 83–108)
pO2, Arterial: 68 mmHg — ABNORMAL LOW (ref 83–108)

## 2022-09-30 LAB — CBC WITH DIFFERENTIAL/PLATELET
Abs Immature Granulocytes: 0.05 10*3/uL (ref 0.00–0.07)
Basophils Absolute: 0 10*3/uL (ref 0.0–0.1)
Basophils Relative: 0 %
Eosinophils Absolute: 0.2 10*3/uL (ref 0.0–0.5)
Eosinophils Relative: 2 %
HCT: 47 % — ABNORMAL HIGH (ref 36.0–46.0)
Hemoglobin: 13.8 g/dL (ref 12.0–15.0)
Immature Granulocytes: 1 %
Lymphocytes Relative: 20 %
Lymphs Abs: 1.9 10*3/uL (ref 0.7–4.0)
MCH: 26.2 pg (ref 26.0–34.0)
MCHC: 29.4 g/dL — ABNORMAL LOW (ref 30.0–36.0)
MCV: 89.4 fL (ref 80.0–100.0)
Monocytes Absolute: 0.8 10*3/uL (ref 0.1–1.0)
Monocytes Relative: 8 %
Neutro Abs: 6.2 10*3/uL (ref 1.7–7.7)
Neutrophils Relative %: 69 %
Platelets: 195 10*3/uL (ref 150–400)
RBC: 5.26 MIL/uL — ABNORMAL HIGH (ref 3.87–5.11)
RDW: 15.8 % — ABNORMAL HIGH (ref 11.5–15.5)
WBC: 9.1 10*3/uL (ref 4.0–10.5)
nRBC: 0 % (ref 0.0–0.2)

## 2022-09-30 MED ORDER — ALBUTEROL (5 MG/ML) CONTINUOUS INHALATION SOLN
10.0000 mg | INHALATION_SOLUTION | RESPIRATORY_TRACT | Status: AC
  Administered 2022-09-30: 10 mg via RESPIRATORY_TRACT
  Filled 2022-09-30 (×2): qty 20

## 2022-09-30 MED ORDER — IOHEXOL 350 MG/ML SOLN
75.0000 mL | Freq: Once | INTRAVENOUS | Status: AC | PRN
  Administered 2022-09-30: 75 mL via INTRAVENOUS

## 2022-09-30 MED ORDER — METHYLPREDNISOLONE SODIUM SUCC 125 MG IJ SOLR
125.0000 mg | Freq: Once | INTRAMUSCULAR | Status: AC
  Administered 2022-09-30: 125 mg via INTRAVENOUS
  Filled 2022-09-30: qty 2

## 2022-09-30 MED ORDER — ALBUTEROL (5 MG/ML) CONTINUOUS INHALATION SOLN
10.0000 mg | INHALATION_SOLUTION | RESPIRATORY_TRACT | Status: AC
  Administered 2022-09-30: 10 mg via RESPIRATORY_TRACT

## 2022-09-30 NOTE — Telephone Encounter (Signed)
Scheduled appointment per 12/2 los. Unable to get through to patient due to the number being disconnected.
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
## 2022-09-30 NOTE — Progress Notes (Signed)
ABG drawn and taken to lab 

## 2022-09-30 NOTE — ED Notes (Signed)
Patient headed to CT
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
## 2022-09-30 NOTE — ED Notes (Signed)
Right chest port accessed for IV's and lab draws. Power Loc port access kit gauge 20 x 1inch with Y site inserted successfully.

## 2022-10-01 DIAGNOSIS — Z79899 Other long term (current) drug therapy: Secondary | ICD-10-CM | POA: Diagnosis not present

## 2022-10-01 DIAGNOSIS — F32A Depression, unspecified: Secondary | ICD-10-CM | POA: Diagnosis present

## 2022-10-01 DIAGNOSIS — F17210 Nicotine dependence, cigarettes, uncomplicated: Secondary | ICD-10-CM | POA: Diagnosis present

## 2022-10-01 DIAGNOSIS — S161XXA Strain of muscle, fascia and tendon at neck level, initial encounter: Secondary | ICD-10-CM | POA: Diagnosis present

## 2022-10-01 DIAGNOSIS — J9611 Chronic respiratory failure with hypoxia: Secondary | ICD-10-CM | POA: Diagnosis present

## 2022-10-01 DIAGNOSIS — Z7902 Long term (current) use of antithrombotics/antiplatelets: Secondary | ICD-10-CM | POA: Diagnosis not present

## 2022-10-01 DIAGNOSIS — I700 Atherosclerosis of aorta: Secondary | ICD-10-CM | POA: Diagnosis present

## 2022-10-01 DIAGNOSIS — J9621 Acute and chronic respiratory failure with hypoxia: Secondary | ICD-10-CM | POA: Diagnosis present

## 2022-10-01 DIAGNOSIS — Z8673 Personal history of transient ischemic attack (TIA), and cerebral infarction without residual deficits: Secondary | ICD-10-CM | POA: Diagnosis not present

## 2022-10-01 DIAGNOSIS — I2510 Atherosclerotic heart disease of native coronary artery without angina pectoris: Secondary | ICD-10-CM | POA: Diagnosis present

## 2022-10-01 DIAGNOSIS — J9622 Acute and chronic respiratory failure with hypercapnia: Secondary | ICD-10-CM

## 2022-10-01 DIAGNOSIS — D509 Iron deficiency anemia, unspecified: Secondary | ICD-10-CM | POA: Insufficient documentation

## 2022-10-01 DIAGNOSIS — S20211A Contusion of right front wall of thorax, initial encounter: Secondary | ICD-10-CM | POA: Diagnosis present

## 2022-10-01 DIAGNOSIS — E782 Mixed hyperlipidemia: Secondary | ICD-10-CM | POA: Insufficient documentation

## 2022-10-01 DIAGNOSIS — Z803 Family history of malignant neoplasm of breast: Secondary | ICD-10-CM | POA: Diagnosis not present

## 2022-10-01 DIAGNOSIS — I110 Hypertensive heart disease with heart failure: Secondary | ICD-10-CM | POA: Diagnosis present

## 2022-10-01 DIAGNOSIS — Y92410 Unspecified street and highway as the place of occurrence of the external cause: Secondary | ICD-10-CM | POA: Diagnosis not present

## 2022-10-01 DIAGNOSIS — I5032 Chronic diastolic (congestive) heart failure: Secondary | ICD-10-CM | POA: Diagnosis present

## 2022-10-01 DIAGNOSIS — F419 Anxiety disorder, unspecified: Secondary | ICD-10-CM | POA: Diagnosis present

## 2022-10-01 DIAGNOSIS — D0511 Intraductal carcinoma in situ of right breast: Secondary | ICD-10-CM | POA: Diagnosis not present

## 2022-10-01 DIAGNOSIS — Z7951 Long term (current) use of inhaled steroids: Secondary | ICD-10-CM | POA: Diagnosis not present

## 2022-10-01 DIAGNOSIS — J441 Chronic obstructive pulmonary disease with (acute) exacerbation: Secondary | ICD-10-CM | POA: Diagnosis present

## 2022-10-01 DIAGNOSIS — Z853 Personal history of malignant neoplasm of breast: Secondary | ICD-10-CM | POA: Diagnosis not present

## 2022-10-01 DIAGNOSIS — Z6841 Body Mass Index (BMI) 40.0 and over, adult: Secondary | ICD-10-CM | POA: Diagnosis not present

## 2022-10-01 DIAGNOSIS — Z8249 Family history of ischemic heart disease and other diseases of the circulatory system: Secondary | ICD-10-CM | POA: Diagnosis not present

## 2022-10-01 DIAGNOSIS — J9601 Acute respiratory failure with hypoxia: Secondary | ICD-10-CM | POA: Diagnosis present

## 2022-10-01 DIAGNOSIS — K219 Gastro-esophageal reflux disease without esophagitis: Secondary | ICD-10-CM | POA: Diagnosis present

## 2022-10-01 LAB — GLUCOSE, CAPILLARY: Glucose-Capillary: 164 mg/dL — ABNORMAL HIGH (ref 70–99)

## 2022-10-01 LAB — CBC
HCT: 45.6 % (ref 36.0–46.0)
Hemoglobin: 13.4 g/dL (ref 12.0–15.0)
MCH: 26.5 pg (ref 26.0–34.0)
MCHC: 29.4 g/dL — ABNORMAL LOW (ref 30.0–36.0)
MCV: 90.1 fL (ref 80.0–100.0)
Platelets: 173 10*3/uL (ref 150–400)
RBC: 5.06 MIL/uL (ref 3.87–5.11)
RDW: 15.5 % (ref 11.5–15.5)
WBC: 8.7 10*3/uL (ref 4.0–10.5)
nRBC: 0 % (ref 0.0–0.2)

## 2022-10-01 LAB — COMPREHENSIVE METABOLIC PANEL
ALT: 10 U/L (ref 0–44)
AST: 10 U/L — ABNORMAL LOW (ref 15–41)
Albumin: 2.9 g/dL — ABNORMAL LOW (ref 3.5–5.0)
Alkaline Phosphatase: 32 U/L — ABNORMAL LOW (ref 38–126)
Anion gap: 8 (ref 5–15)
BUN: 12 mg/dL (ref 8–23)
CO2: 35 mmol/L — ABNORMAL HIGH (ref 22–32)
Calcium: 8.4 mg/dL — ABNORMAL LOW (ref 8.9–10.3)
Chloride: 97 mmol/L — ABNORMAL LOW (ref 98–111)
Creatinine, Ser: 0.89 mg/dL (ref 0.44–1.00)
GFR, Estimated: >60 mL/min (ref >60)
Glucose, Bld: 158 mg/dL — ABNORMAL HIGH (ref 70–99)
Potassium: 4.6 mmol/L (ref 3.5–5.1)
Sodium: 140 mmol/L (ref 135–145)
Total Bilirubin: 0.3 mg/dL (ref 0.3–1.2)
Total Protein: 6.3 g/dL — ABNORMAL LOW (ref 6.5–8.1)

## 2022-10-01 LAB — MRSA NEXT GEN BY PCR, NASAL: MRSA by PCR Next Gen: DETECTED — AB

## 2022-10-01 LAB — HIV ANTIBODY (ROUTINE TESTING W REFLEX): HIV Screen 4th Generation wRfx: NONREACTIVE

## 2022-10-01 LAB — MAGNESIUM: Magnesium: 2.3 mg/dL (ref 1.7–2.4)

## 2022-10-01 LAB — PHOSPHORUS: Phosphorus: 5.4 mg/dL — ABNORMAL HIGH (ref 2.5–4.6)

## 2022-10-01 MED ORDER — IPRATROPIUM-ALBUTEROL 0.5-2.5 (3) MG/3ML IN SOLN
3.0000 mL | Freq: Four times a day (QID) | RESPIRATORY_TRACT | Status: DC
  Administered 2022-10-01 – 2022-10-02 (×5): 3 mL via RESPIRATORY_TRACT
  Filled 2022-10-01 (×5): qty 3

## 2022-10-01 MED ORDER — FERROUS SULFATE 325 (65 FE) MG PO TABS
325.0000 mg | ORAL_TABLET | ORAL | Status: DC
  Filled 2022-10-01: qty 1

## 2022-10-01 MED ORDER — SODIUM CHLORIDE 0.9 % IV SOLN
500.0000 mg | INTRAVENOUS | Status: DC
  Administered 2022-10-01 – 2022-10-02 (×2): 500 mg via INTRAVENOUS
  Filled 2022-10-01 (×2): qty 5

## 2022-10-01 MED ORDER — UMECLIDINIUM BROMIDE 62.5 MCG/ACT IN AEPB
1.0000 | INHALATION_SPRAY | Freq: Every day | RESPIRATORY_TRACT | Status: DC
  Administered 2022-10-01: 1 via RESPIRATORY_TRACT
  Filled 2022-10-01: qty 7

## 2022-10-01 MED ORDER — METHYLPREDNISOLONE SODIUM SUCC 40 MG IJ SOLR
40.0000 mg | Freq: Two times a day (BID) | INTRAMUSCULAR | Status: DC
  Administered 2022-10-01 – 2022-10-02 (×3): 40 mg via INTRAVENOUS
  Filled 2022-10-01 (×3): qty 1

## 2022-10-01 MED ORDER — ALBUTEROL SULFATE HFA 108 (90 BASE) MCG/ACT IN AERS: 2.0000 | INHALATION_SPRAY | Freq: Four times a day (QID) | RESPIRATORY_TRACT | Status: DC | PRN

## 2022-10-01 MED ORDER — IPRATROPIUM-ALBUTEROL 0.5-2.5 (3) MG/3ML IN SOLN: 3.0000 mL | RESPIRATORY_TRACT | Status: DC | PRN

## 2022-10-01 MED ORDER — MOMETASONE FURO-FORMOTEROL FUM 100-5 MCG/ACT IN AERO
2.0000 | INHALATION_SPRAY | Freq: Two times a day (BID) | RESPIRATORY_TRACT | Status: DC
  Administered 2022-10-01: 2 via RESPIRATORY_TRACT
  Filled 2022-10-01: qty 8.8

## 2022-10-01 MED ORDER — GABAPENTIN 300 MG PO CAPS: 600.0000 mg | ORAL_CAPSULE | Freq: Three times a day (TID) | ORAL | Status: DC | PRN

## 2022-10-01 MED ORDER — ONDANSETRON HCL 4 MG/2ML IJ SOLN: 4.0000 mg | Freq: Four times a day (QID) | INTRAMUSCULAR | Status: DC | PRN

## 2022-10-01 MED ORDER — VENLAFAXINE HCL ER 75 MG PO CP24
150.0000 mg | ORAL_CAPSULE | Freq: Every day | ORAL | Status: DC
  Administered 2022-10-02: 150 mg via ORAL
  Filled 2022-10-01: qty 2

## 2022-10-01 MED ORDER — FLUTICASONE PROPIONATE 50 MCG/ACT NA SUSP: 2.0000 | Freq: Every day | NASAL | Status: DC | PRN

## 2022-10-01 MED ORDER — ONDANSETRON HCL 4 MG PO TABS: 4.0000 mg | ORAL_TABLET | Freq: Four times a day (QID) | ORAL | Status: DC | PRN

## 2022-10-01 MED ORDER — PANTOPRAZOLE SODIUM 40 MG PO TBEC
40.0000 mg | DELAYED_RELEASE_TABLET | Freq: Two times a day (BID) | ORAL | Status: DC
  Administered 2022-10-01 – 2022-10-02 (×3): 40 mg via ORAL
  Filled 2022-10-01 (×3): qty 1

## 2022-10-01 MED ORDER — CLONAZEPAM 0.5 MG PO TABS
0.5000 mg | ORAL_TABLET | Freq: Two times a day (BID) | ORAL | Status: DC | PRN
  Administered 2022-10-01 – 2022-10-02 (×2): 0.5 mg via ORAL
  Filled 2022-10-01 (×2): qty 1

## 2022-10-01 MED ORDER — BUDESONIDE 0.5 MG/2ML IN SUSP
0.5000 mg | Freq: Two times a day (BID) | RESPIRATORY_TRACT | Status: DC
  Administered 2022-10-01 – 2022-10-02 (×2): 0.5 mg via RESPIRATORY_TRACT
  Filled 2022-10-01 (×2): qty 2

## 2022-10-01 MED ORDER — IPRATROPIUM-ALBUTEROL 0.5-2.5 (3) MG/3ML IN SOLN
3.0000 mL | Freq: Four times a day (QID) | RESPIRATORY_TRACT | Status: DC
  Administered 2022-10-01: 3 mL via RESPIRATORY_TRACT

## 2022-10-01 MED ORDER — IPRATROPIUM-ALBUTEROL 0.5-2.5 (3) MG/3ML IN SOLN: 3.0000 mL | Freq: Four times a day (QID) | RESPIRATORY_TRACT | Status: DC

## 2022-10-01 MED ORDER — TRAZODONE HCL 50 MG PO TABS
100.0000 mg | ORAL_TABLET | Freq: Every evening | ORAL | Status: DC | PRN
  Administered 2022-10-01: 100 mg via ORAL
  Filled 2022-10-01: qty 2

## 2022-10-01 MED ORDER — MUPIROCIN 2 % EX OINT
TOPICAL_OINTMENT | Freq: Two times a day (BID) | CUTANEOUS | Status: DC
  Administered 2022-10-01: 1 via NASAL
  Filled 2022-10-01: qty 22

## 2022-10-01 MED ORDER — TIOTROPIUM BROMIDE MONOHYDRATE 18 MCG IN CAPS: 18.0000 ug | ORAL_CAPSULE | Freq: Every day | RESPIRATORY_TRACT | Status: DC

## 2022-10-01 MED ORDER — BUDESONIDE 0.5 MG/2ML IN SUSP
RESPIRATORY_TRACT | Status: AC
  Administered 2022-10-01: 0.5 mg via RESPIRATORY_TRACT
  Filled 2022-10-01: qty 2

## 2022-10-01 MED ORDER — DM-GUAIFENESIN ER 30-600 MG PO TB12
1.0000 | ORAL_TABLET | Freq: Two times a day (BID) | ORAL | Status: DC
  Administered 2022-10-01 – 2022-10-02 (×3): 1 via ORAL
  Filled 2022-10-01 (×3): qty 1

## 2022-10-01 MED ORDER — ALBUTEROL SULFATE (2.5 MG/3ML) 0.083% IN NEBU: 2.5000 mg | INHALATION_SOLUTION | Freq: Four times a day (QID) | RESPIRATORY_TRACT | Status: DC | PRN

## 2022-10-01 MED ORDER — ARFORMOTEROL TARTRATE 15 MCG/2ML IN NEBU
15.0000 ug | INHALATION_SOLUTION | Freq: Two times a day (BID) | RESPIRATORY_TRACT | Status: DC
  Administered 2022-10-01 – 2022-10-02 (×3): 15 ug via RESPIRATORY_TRACT
  Filled 2022-10-01 (×3): qty 2

## 2022-10-01 MED ORDER — CHLORHEXIDINE GLUCONATE CLOTH 2 % EX PADS
6.0000 | MEDICATED_PAD | Freq: Every day | CUTANEOUS | Status: DC
  Administered 2022-10-01 – 2022-10-02 (×3): 6 via TOPICAL

## 2022-10-01 MED ORDER — OXYCODONE HCL 5 MG PO TABS
5.0000 mg | ORAL_TABLET | Freq: Three times a day (TID) | ORAL | Status: DC | PRN
  Administered 2022-10-01 – 2022-10-02 (×2): 5 mg via ORAL
  Filled 2022-10-01 (×2): qty 1

## 2022-10-01 MED ORDER — LETROZOLE 2.5 MG PO TABS
2.5000 mg | ORAL_TABLET | Freq: Every day | ORAL | Status: DC
  Administered 2022-10-01: 2.5 mg via ORAL
  Filled 2022-10-01 (×4): qty 1

## 2022-10-01 MED ORDER — IPRATROPIUM-ALBUTEROL 0.5-2.5 (3) MG/3ML IN SOLN
3.0000 mL | Freq: Three times a day (TID) | RESPIRATORY_TRACT | Status: DC
  Administered 2022-10-01: 3 mL via RESPIRATORY_TRACT
  Filled 2022-10-01: qty 3

## 2022-10-01 MED ORDER — ENOXAPARIN SODIUM 40 MG/0.4ML IJ SOSY
40.0000 mg | PREFILLED_SYRINGE | INTRAMUSCULAR | Status: DC
  Administered 2022-10-01: 40 mg via SUBCUTANEOUS
  Filled 2022-10-01 (×2): qty 0.4

## 2022-10-01 MED ORDER — SIMVASTATIN 20 MG PO TABS
40.0000 mg | ORAL_TABLET | Freq: Every day | ORAL | Status: DC
  Administered 2022-10-01: 40 mg via ORAL
  Filled 2022-10-01: qty 2

## 2022-10-01 MED ORDER — ALBUTEROL SULFATE (2.5 MG/3ML) 0.083% IN NEBU: 2.5000 mg | INHALATION_SOLUTION | RESPIRATORY_TRACT | Status: DC | PRN

## 2022-10-01 MED ORDER — MONTELUKAST SODIUM 10 MG PO TABS
10.0000 mg | ORAL_TABLET | Freq: Every day | ORAL | Status: DC
  Administered 2022-10-01: 10 mg via ORAL
  Filled 2022-10-01: qty 1

## 2022-10-01 NOTE — Progress Notes (Signed)
Pt arrived to room 9 on Bipap 40% 16/8 and tele via stretcher, VSS, A/Ox4, mild pain in back but resting comfortably.
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
## 2022-10-01 NOTE — Progress Notes (Signed)
Pt transferred from ED6 to ICU 9 on BIPAP with no complications

## 2022-10-01 NOTE — Progress Notes (Signed)
Date and time results received: 10/01/22 1216 (use smartphrase ".now" to insert current time)  Test: MRSA of Nares  Critical Value: Positive  Name of Provider Notified: Dr Dyann Kief   Orders Received? Or Actions Taken?:  Bactroban ointment BID ordered

## 2022-10-01 NOTE — Plan of Care (Signed)
  Problem: Acute Rehab PT Goals(only PT should resolve) Goal: Pt Will Go Supine/Side To Sit Outcome: Progressing Flowsheets (Taken 10/01/2022 1118) Pt will go Supine/Side to Sit:  with modified independence  Independently Goal: Patient Will Transfer Sit To/From Stand Outcome: Progressing Flowsheets (Taken 10/01/2022 1118) Patient will transfer sit to/from stand:  with modified independence  Independently Goal: Pt Will Transfer Bed To Chair/Chair To Bed Outcome: Progressing Flowsheets (Taken 10/01/2022 1118) Pt will Transfer Bed to Chair/Chair to Bed: with modified independence Goal: Pt Will Ambulate Outcome: Progressing Flowsheets (Taken 10/01/2022 1118) Pt will Ambulate:  > 125 feet  with modified independence  with least restrictive assistive device   11:19 AM, 10/01/22 Lonell Grandchild, MPT Physical Therapist with [facility] 336 [phone] office [phone] mobile phone

## 2022-10-02 DIAGNOSIS — D0511 Intraductal carcinoma in situ of right breast: Secondary | ICD-10-CM

## 2022-10-02 MED ORDER — DM-GUAIFENESIN ER 30-600 MG PO TB12: 1.0000 | ORAL_TABLET | Freq: Two times a day (BID) | ORAL | 0 refills | Status: AC

## 2022-10-02 MED ORDER — PREDNISONE 20 MG PO TABS: ORAL_TABLET | ORAL | 0 refills | Status: DC

## 2022-10-02 MED ORDER — HEPARIN SOD (PORK) LOCK FLUSH 100 UNIT/ML IV SOLN
500.0000 [IU] | Freq: Once | INTRAVENOUS | Status: AC
  Administered 2022-10-02: 500 [IU] via INTRAVENOUS
  Filled 2022-10-02: qty 5

## 2022-10-02 MED ORDER — DOXYCYCLINE HYCLATE 100 MG PO TABS: 100.0000 mg | ORAL_TABLET | Freq: Two times a day (BID) | ORAL | 0 refills | Status: AC

## 2022-10-02 MED ORDER — ENOXAPARIN SODIUM 60 MG/0.6ML IJ SOSY: 55.0000 mg | PREFILLED_SYRINGE | INTRAMUSCULAR | Status: DC

## 2022-10-02 NOTE — Progress Notes (Signed)
Pt refused to wear BIPAP rt explained the importance of wearing BIPAP patient still refused. Nurse also tried to talk with patient.
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
## 2022-10-02 NOTE — Progress Notes (Signed)
SATURATION QUALIFICATIONS: (This note is used to comply with regulatory documentation for home oxygen)  Patient Saturations on Room Air at Rest = 81%  Patient Saturations on Room Air while Ambulating = 79%  Patient Saturations on 2 Liters of oxygen while Ambulating = 91%  Please briefly explain why patient needs home oxygen:  Pt. Needs oxygen for saturations to maintain at 90%
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
## 2022-10-02 NOTE — Care Management Important Message (Signed)
Important Message  Patient Details  Name: April [name] MRN: [id number] Date of Birth: Apr 26, 1956   Medicare Important Message Given:  N/A - LOS <3 / Initial given by admissions     Tommy Medal 10/02/2022, 11:42 AM

## 2022-10-03 NOTE — TOC Transition Note (Signed)
Transition of Care [facility]) - CM/SW Discharge Note   Patient Details  Name: April [name] MRN: [id number] Date of Birth: 04/05/1956  Transition of Care [facility]) CM/SW Contact:  Iona Beard, [place] Phone Number: 10/03/2022, 11:57 AM   Clinical Narrative:    Note placed 12pm 10/03/2022: Pt reached out to [place] stating that no one from [place] had arrived to her home. CSW spoke to [place] with [facility] who spoke with pt and they are coming to her home today.    Barriers to Discharge: No Barriers Identified   Patient Goals and CMS Choice        Discharge Placement                       Discharge Plan and Services                DME Arranged: Oxygen DME Agency: [facility] Date DME Agency Contacted: 10/02/22 Time DME Agency Contacted: [id number] Representative spoke with at DME Agency: [place]: PT [place]: [place] ([place]) Date [place]: 10/02/22 Time [place]: [place] Representative spoke with at [place]: [place]  Social Determinants of Health ([place]) Interventions     Readmission Risk Interventions     No data to display

## 2022-10-06 ENCOUNTER — Telehealth: Payer: Self-pay
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
## 2022-10-28 ENCOUNTER — Telehealth: Payer: Self-pay | Admitting: *Deleted

## 2022-10-28 NOTE — Telephone Encounter (Signed)
PT called in with questions regarding lung cancer screening not being approved. I explained to pt that since she had a breast cancer dx in 2022 that we cannot do a lung screening CT until she has been cancer free for 5 years. PT verbalized understanding and will contact Dede Query, PA to see if she needs any other type of scan. I did advise pt that she did have a CT Angio Chest 09/2022 when she was in the hospital. Pt verbalized understanding.

## 2022-11-05 ENCOUNTER — Ambulatory Visit: Payer: Self-pay | Admitting: General Surgery

## 2022-11-07 ENCOUNTER — Encounter (HOSPITAL_BASED_OUTPATIENT_CLINIC_OR_DEPARTMENT_OTHER): Payer: Self-pay | Admitting: Pulmonary Disease

## 2022-11-07 ENCOUNTER — Ambulatory Visit (INDEPENDENT_AMBULATORY_CARE_PROVIDER_SITE_OTHER): Payer: PPO | Admitting: Pulmonary Disease

## 2022-11-07 VITALS — BP 118/70 | HR 84 | Temp 98.8°F | Ht 63.0 in | Wt 232.2 lb

## 2022-11-07 DIAGNOSIS — J449 Chronic obstructive pulmonary disease, unspecified: Secondary | ICD-10-CM

## 2022-11-07 DIAGNOSIS — Z720 Tobacco use: Secondary | ICD-10-CM

## 2022-11-07 DIAGNOSIS — J441 Chronic obstructive pulmonary disease with (acute) exacerbation: Secondary | ICD-10-CM | POA: Diagnosis not present

## 2022-11-07 DIAGNOSIS — J9611 Chronic respiratory failure with hypoxia: Secondary | ICD-10-CM

## 2022-11-07 DIAGNOSIS — J9612 Chronic respiratory failure with hypercapnia: Secondary | ICD-10-CM

## 2022-11-07 MED ORDER — PREDNISONE 10 MG PO TABS: ORAL_TABLET | ORAL | 0 refills | Status: AC

## 2022-11-07 NOTE — Patient Instructions (Signed)
x schedule PFTs.  X Prednisone 10 mg tabs  Take 2 tabs daily with food x 5ds, then 1 tab daily with food x 5ds then STOP Continue with Flonase  Try to use your oxygen 24/7 to maintain saturation above 90%  X prescription for nebulizer to [facility]  X sample of Trelegy 100-1 puff daily, rinse mouth after use Use this instead of Advair and Spiriva, if this works, call us for prescription

## 2022-11-07 NOTE — Assessment & Plan Note (Signed)
This indicates that her COPD is likely end-stage. Try to use your oxygen 24/7 to maintain saturation above 90% She did not tolerate BiPAP in the hospital and absolutely does not want to try this again although technically she would qualify for NIV.
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
## 2022-11-07 NOTE — Assessment & Plan Note (Signed)
I explained to her that smoking cessation is the only intervention that would add years to her life.  She has been unable to quit in spite of trying nicotine patches and Chantix in the past.  She is unwilling to set a quit date. I explained to her the dangers of smoking while she has oxygen in the house. She already had a CT angiogram done in December so does not need lung cancer screening for at least another year.  Does not qualify due to prior history of breast cancer

## 2022-11-07 NOTE — Assessment & Plan Note (Signed)
x schedule PFTs. April [name] certainly has severe COPD.  I explained to her that when her oxygen sats dropping and carbon dioxide is as increasing that generally means that COPD is an end-stage. Will obtain PFTs to quantify lung function.  We will treat her as an exacerbation with a low-dose course of prednisone   X Prednisone 10 mg tabs  Take 2 tabs daily with food x 5ds, then 1 tab daily with food x 5ds then STOP Continue with Flonase  X prescription for nebulizer to [facility]  X sample of Trelegy 100-1 puff daily, rinse mouth after use Use this instead of Advair and Spiriva, if this works, call us for prescription

## 2022-11-18 ENCOUNTER — Other Ambulatory Visit: Payer: Self-pay | Admitting: Hematology and Oncology

## 2022-11-18 ENCOUNTER — Telehealth (HOSPITAL_BASED_OUTPATIENT_CLINIC_OR_DEPARTMENT_OTHER): Payer: Self-pay | Admitting: Pulmonary Disease

## 2022-11-18 MED ORDER — TRELEGY ELLIPTA 100-62.5-25 MCG/ACT IN AEPB: 1.0000 | INHALATION_SPRAY | Freq: Every day | RESPIRATORY_TRACT | 5 refills | Status: DC

## 2022-11-18 NOTE — Telephone Encounter (Signed)
Rx for Trelegy sent to preferred pharmacy for pt. Called and spoke with pt letting her know this had been done and she verbalized understanding. Nothing further needed.

## 2022-11-24 ENCOUNTER — Encounter: Payer: Self-pay | Admitting: *Deleted

## 2022-11-28 ENCOUNTER — Encounter (HOSPITAL_COMMUNITY): Payer: Self-pay | Admitting: General Surgery
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
## 2022-11-28 NOTE — Progress Notes (Signed)
PCP - Dr Kathryne Eriksson Cardiologist - n/a Oncology - Dr [name] Pulmonology - Dr Kara Mead  Chest x-ray - 09/30/22 (2V) EKG - 09/30/22 Stress Test - n/a ECHO - 07/24/22 Cardiac Cath - n/a  ICD Pacemaker/Loop - n/a  Sleep Study -  n/a CPAP - none  Blood Thinner Instructions:  Follow your surgeon's instructions on when to stop Plavix prior to surgery.  Last dose was on 11/26/22.  Anesthesia review: Yes  STOP now taking any Aspirin (unless otherwise instructed by your surgeon), Aleve, Naproxen, Ibuprofen, Motrin, Advil, Goody's, BC's, all herbal medications, fish oil, and all vitamins.   NPO  Coronavirus Screening Do you have any of the following symptoms:  Cough yes/no: No Fever (>100.73F)  yes/no: No Runny nose yes/no: No Sore throat yes/no: No Difficulty breathing/shortness of breath  yes/no: No  Have you traveled in the last 14 days and where? yes/no: No  Patient verbalized understanding of instructions that were given via phone.

## 2022-11-28 NOTE — Anesthesia Preprocedure Evaluation (Signed)
Anesthesia Evaluation  Patient identified by MRN, date of birth, ID band Patient awake    Reviewed: Allergy & Precautions, NPO status , Patient's Chart, lab work & pertinent test results  History of Anesthesia Complications Negative for: history of anesthetic complications  Airway Mallampati: III  TM Distance: >3 FB Neck ROM: Full    Dental  (+) Dental Advisory Given   Pulmonary shortness of breath and Long-Term Oxygen Therapy, COPD,  oxygen dependent, Current Smoker and Patient abstained from smoking.    + decreased breath sounds+ wheezing      Cardiovascular hypertension, Pt. on medications (-) angina +CHF  (-) Past MI  Rhythm:Regular     Neuro/Psych  Headaches PSYCHIATRIC DISORDERS Anxiety Depression    TIACVA    GI/Hepatic Neg liver ROS,GERD  ,,  Endo/Other  negative endocrine ROS    Renal/GU negative Renal ROS     Musculoskeletal  (+) Arthritis ,    Abdominal   Peds  Hematology negative hematology ROS (+) Lab Results      Component                Value               Date                      WBC                      8.7                 10/01/2022                HGB                      13.4                10/01/2022                HCT                      45.6                10/01/2022                MCV                      90.1                10/01/2022                PLT                      173                 10/01/2022              Anesthesia Other Findings   Reproductive/Obstetrics                             Anesthesia Physical Anesthesia Plan  ASA: 3  Anesthesia Plan: MAC   Post-op Pain Management: Minimal or no pain anticipated   Induction: Intravenous  PONV Risk Score and Plan: 1 and Propofol infusion, Treatment may vary due to age or medical condition and Ondansetron  Airway Management Planned: Nasal Cannula and Natural Airway  Additional Equipment:  None  Intra-op Plan:  Post-operative Plan:   Informed Consent: I have reviewed the patients History and Physical, chart, labs and discussed the procedure including the risks, benefits and alternatives for the proposed anesthesia with the patient or authorized representative who has indicated his/her understanding and acceptance.     Dental advisory given  Plan Discussed with: CRNA  Anesthesia Plan Comments: (PAT note written 11/28/2022 by Myra Gianotti, PA-C.  )       Anesthesia Quick Evaluation

## 2022-12-01 ENCOUNTER — Other Ambulatory Visit: Payer: Self-pay

## 2022-12-01 ENCOUNTER — Ambulatory Visit (HOSPITAL_BASED_OUTPATIENT_CLINIC_OR_DEPARTMENT_OTHER): Payer: PPO | Admitting: Vascular Surgery

## 2022-12-01 ENCOUNTER — Encounter (HOSPITAL_COMMUNITY)
Admission: RE | Disposition: A | Discharge status: Home / Self Care | Payer: Self-pay | Source: Home / Self Care | Attending: General Surgery

## 2022-12-01 ENCOUNTER — Ambulatory Visit (HOSPITAL_COMMUNITY): Payer: PPO | Admitting: Vascular Surgery

## 2022-12-01 ENCOUNTER — Encounter (HOSPITAL_COMMUNITY): Payer: Self-pay | Admitting: General Surgery

## 2022-12-01 ENCOUNTER — Ambulatory Visit (HOSPITAL_COMMUNITY)
Admission: RE | Admit: 2022-12-01 | Discharge: 2022-12-01 | Disposition: A | Discharge status: Home / Self Care | Payer: PPO | Attending: General Surgery | Admitting: General Surgery

## 2022-12-01 DIAGNOSIS — J449 Chronic obstructive pulmonary disease, unspecified: Secondary | ICD-10-CM

## 2022-12-01 DIAGNOSIS — I110 Hypertensive heart disease with heart failure: Secondary | ICD-10-CM

## 2022-12-01 DIAGNOSIS — I509 Heart failure, unspecified: Secondary | ICD-10-CM

## 2022-12-01 DIAGNOSIS — I5032 Chronic diastolic (congestive) heart failure: Secondary | ICD-10-CM | POA: Diagnosis not present

## 2022-12-01 DIAGNOSIS — F1290 Cannabis use, unspecified, uncomplicated: Secondary | ICD-10-CM | POA: Diagnosis not present

## 2022-12-01 DIAGNOSIS — F17210 Nicotine dependence, cigarettes, uncomplicated: Secondary | ICD-10-CM

## 2022-12-01 DIAGNOSIS — K219 Gastro-esophageal reflux disease without esophagitis: Secondary | ICD-10-CM | POA: Insufficient documentation

## 2022-12-01 DIAGNOSIS — Z803 Family history of malignant neoplasm of breast: Secondary | ICD-10-CM | POA: Insufficient documentation

## 2022-12-01 DIAGNOSIS — Z8249 Family history of ischemic heart disease and other diseases of the circulatory system: Secondary | ICD-10-CM | POA: Insufficient documentation

## 2022-12-01 DIAGNOSIS — Z9981 Dependence on supplemental oxygen: Secondary | ICD-10-CM | POA: Insufficient documentation

## 2022-12-01 DIAGNOSIS — Z9013 Acquired absence of bilateral breasts and nipples: Secondary | ICD-10-CM | POA: Diagnosis not present

## 2022-12-01 DIAGNOSIS — Z170 Estrogen receptor positive status [ER+]: Secondary | ICD-10-CM | POA: Diagnosis not present

## 2022-12-01 DIAGNOSIS — C50911 Malignant neoplasm of unspecified site of right female breast: Secondary | ICD-10-CM | POA: Diagnosis present

## 2022-12-01 DIAGNOSIS — Z452 Encounter for adjustment and management of vascular access device: Secondary | ICD-10-CM

## 2022-12-01 HISTORY — DX: Dependence on supplemental oxygen: Z99.81

## 2022-12-01 HISTORY — DX: Pneumonia, unspecified organism: J18.9

## 2022-12-01 HISTORY — PX: PORT-A-CATH REMOVAL: SHX5289

## 2022-12-01 LAB — SURGICAL PCR SCREEN
MRSA, PCR: NEGATIVE
Staphylococcus aureus: NEGATIVE

## 2022-12-01 SURGERY — REMOVAL PORT-A-CATH
Anesthesia: Monitor Anesthesia Care | Site: Chest

## 2022-12-01 MED ORDER — MIDAZOLAM HCL 2 MG/2ML IJ SOLN
INTRAMUSCULAR | Status: AC
  Filled 2022-12-01: qty 2

## 2022-12-01 MED ORDER — LACTATED RINGERS IV SOLN: INTRAVENOUS | Status: DC

## 2022-12-01 MED ORDER — FENTANYL CITRATE (PF) 250 MCG/5ML IJ SOLN
INTRAMUSCULAR | Status: AC
  Filled 2022-12-01: qty 5

## 2022-12-01 MED ORDER — FENTANYL CITRATE (PF) 100 MCG/2ML IJ SOLN
INTRAMUSCULAR | Status: DC | PRN
  Administered 2022-12-01: 50 ug via INTRAVENOUS

## 2022-12-01 MED ORDER — ONDANSETRON HCL 4 MG/2ML IJ SOLN
INTRAMUSCULAR | Status: AC
  Filled 2022-12-01: qty 2

## 2022-12-01 MED ORDER — 0.9 % SODIUM CHLORIDE (POUR BTL) OPTIME
TOPICAL | Status: DC | PRN
  Administered 2022-12-01: 1000 mL

## 2022-12-01 MED ORDER — CHLORHEXIDINE GLUCONATE 0.12 % MT SOLN
15.0000 mL | Freq: Once | OROMUCOSAL | Status: AC
  Administered 2022-12-01: 15 mL via OROMUCOSAL
  Filled 2022-12-01: qty 15

## 2022-12-01 MED ORDER — PHENYLEPHRINE 80 MCG/ML (10ML) SYRINGE FOR IV PUSH (FOR BLOOD PRESSURE SUPPORT)
PREFILLED_SYRINGE | INTRAVENOUS | Status: DC | PRN
  Administered 2022-12-01: 240 ug via INTRAVENOUS
  Administered 2022-12-01 (×2): 160 ug via INTRAVENOUS

## 2022-12-01 MED ORDER — BUPIVACAINE HCL (PF) 0.25 % IJ SOLN
INTRAMUSCULAR | Status: DC | PRN
  Administered 2022-12-01: 10 mL

## 2022-12-01 MED ORDER — HYDROCODONE-ACETAMINOPHEN 5-325 MG PO TABS: 1.0000 | ORAL_TABLET | ORAL | 0 refills | Status: DC | PRN

## 2022-12-01 MED ORDER — PROPOFOL 10 MG/ML IV BOLUS
INTRAVENOUS | Status: AC
  Filled 2022-12-01: qty 20

## 2022-12-01 MED ORDER — PROPOFOL 500 MG/50ML IV EMUL
INTRAVENOUS | Status: DC | PRN
  Administered 2022-12-01: 100 ug/kg/min via INTRAVENOUS

## 2022-12-01 MED ORDER — MIDAZOLAM HCL 5 MG/5ML IJ SOLN
INTRAMUSCULAR | Status: DC | PRN
  Administered 2022-12-01: 2 mg via INTRAVENOUS

## 2022-12-01 MED ORDER — ONDANSETRON HCL 4 MG/2ML IJ SOLN
INTRAMUSCULAR | Status: DC | PRN
  Administered 2022-12-01: 4 mg via INTRAVENOUS

## 2022-12-01 MED ORDER — ORAL CARE MOUTH RINSE: 15.0000 mL | Freq: Once | OROMUCOSAL | Status: AC

## 2022-12-01 SURGICAL SUPPLY — 28 items
ADH SKN CLS APL DERMABOND .7 (GAUZE/BANDAGES/DRESSINGS) ×1
APL PRP STRL LF ISPRP CHG 10.5 (MISCELLANEOUS) ×1
APPLICATOR CHLORAPREP 10.5 ORG (MISCELLANEOUS) ×1 IMPLANT
BAG COUNTER SPONGE SURGICOUNT (BAG) ×1 IMPLANT
BAG SPNG CNTER NS LX DISP (BAG) ×1
COVER SURGICAL LIGHT HANDLE (MISCELLANEOUS) ×1 IMPLANT
DERMABOND ADVANCED .7 DNX12 (GAUZE/BANDAGES/DRESSINGS) ×1 IMPLANT
DRAPE LAPAROTOMY 100X72 PEDS (DRAPES) ×1 IMPLANT
ELECT CAUTERY BLADE 6.4 (BLADE) ×1 IMPLANT
ELECT REM PT RETURN 9FT ADLT (ELECTROSURGICAL) ×1
ELECTRODE REM PT RTRN 9FT ADLT (ELECTROSURGICAL) ×1 IMPLANT
GAUZE 4X4 16PLY ~~LOC~~+RFID DBL (SPONGE) ×1 IMPLANT
GLOVE BIO SURGEON STRL SZ7.5 (GLOVE) ×1 IMPLANT
GOWN STRL REUS W/ TWL LRG LVL3 (GOWN DISPOSABLE) ×2 IMPLANT
GOWN STRL REUS W/TWL LRG LVL3 (GOWN DISPOSABLE) ×1
KIT BASIN OR (CUSTOM PROCEDURE TRAY) ×1 IMPLANT
KIT TURNOVER KIT B (KITS) ×1 IMPLANT
NDL HYPO 25GX1X1/2 BEV (NEEDLE) ×1 IMPLANT
NEEDLE HYPO 25GX1X1/2 BEV (NEEDLE) ×1 IMPLANT
NS IRRIG 1000ML POUR BTL (IV SOLUTION) ×1 IMPLANT
PACK GENERAL/GYN (CUSTOM PROCEDURE TRAY) ×1 IMPLANT
PAD ARMBOARD 7.5X6 YLW CONV (MISCELLANEOUS) ×2 IMPLANT
SUT MNCRL AB 4-0 PS2 18 (SUTURE) ×1 IMPLANT
SUT VIC AB 3-0 SH 27 (SUTURE) ×1
SUT VIC AB 3-0 SH 27X BRD (SUTURE) ×1 IMPLANT
SYR CONTROL 10ML LL (SYRINGE) ×1 IMPLANT
TOWEL GREEN STERILE (TOWEL DISPOSABLE) ×1 IMPLANT
TOWEL GREEN STERILE FF (TOWEL DISPOSABLE) ×1 IMPLANT

## 2022-12-01 NOTE — Anesthesia Procedure Notes (Signed)
Procedure Name: MAC Date/Time: 12/01/2022 1:45 PM  Performed by: Inda Coke, CRNAPre-anesthesia Checklist: Patient identified, Emergency Drugs available, Suction available, Timeout performed and Patient being monitored Patient Re-evaluated:Patient Re-evaluated prior to induction Oxygen Delivery Method: Simple face mask Induction Type: IV induction Dental Injury: Teeth and Oropharynx as per pre-operative assessment

## 2022-12-01 NOTE — Transfer of Care (Signed)
Immediate Anesthesia Transfer of Care Note  Patient: April [name]  Procedure(s) Performed: REMOVAL PORT-A-CATH (Chest)  Patient Location: PACU  Anesthesia Type:MAC  Level of Consciousness: awake and alert   Airway & Oxygen Therapy: Patient Spontanous Breathing and Patient connected to face mask oxygen  Post-op Assessment: Report given to RN and Post -op Vital signs reviewed and stable  Post vital signs: Reviewed and stable  Last Vitals:  Vitals Value Taken Time  BP 124/60 12/01/22 1420  Temp    Pulse 82 12/01/22 1426  Resp 16 12/01/22 1426  SpO2 94 % 12/01/22 1426  Vitals shown include unvalidated device data.  Last Pain:  Vitals:   12/01/22 1223  TempSrc:   PainSc: 0-No pain         Complications: No notable events documented.

## 2022-12-01 NOTE — Anesthesia Postprocedure Evaluation (Signed)
Anesthesia Post Note  Patient: April [name]  Procedure(s) Performed: REMOVAL PORT-A-CATH (Chest)     Patient location during evaluation: PACU Anesthesia Type: MAC Level of consciousness: awake and alert Pain management: pain level controlled Vital Signs Assessment: post-procedure vital signs reviewed and stable Respiratory status: spontaneous breathing, nonlabored ventilation and respiratory function stable Cardiovascular status: stable and blood pressure returned to baseline Postop Assessment: no apparent nausea or vomiting Anesthetic complications: no   No notable events documented.  Last Vitals:  Vitals:   12/01/22 1430 12/01/22 1445  BP: 131/61 138/71  Pulse: 83 83  Resp: 18 20  Temp:  36.8 C  SpO2: 92% 93%    Last Pain:  Vitals:   12/01/22 1445  TempSrc:   PainSc: 0-No pain                 Hollis Tuller

## 2022-12-01 NOTE — H&P (Signed)
MRN: [id number] DOB: [birth year] Subjective   Chief Complaint: Follow-up (Discuss port removal/)   History of Present Illness: April [name] is a 67 y.o. female who is seen today for bilateral breast cancer. 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 She has been well since her last visit. She has no complaints today. She would like to have her port removed.    Review of Systems: A complete review of systems was obtained from the patient. I have reviewed this information and discussed as appropriate with the patient. See HPI as well for other ROS.  ROS   Medical History: Past Medical History:  Diagnosis Date  Anxiety  Arthritis  Asthma, unspecified asthma severity, unspecified whether complicated, unspecified whether persistent  CHF (congestive heart failure) (CMS-HCC)  COPD (chronic obstructive pulmonary disease) (CMS-HCC)  GERD (gastroesophageal reflux disease)  History of cancer  History of stroke  Hyperlipidemia   Patient Active Problem List  Diagnosis  Malignant neoplasm of upper-outer quadrant of left breast in female, estrogen receptor positive  TIA (transient ischemic attack)  Chronic diastolic (congestive) heart failure (CMS-HCC)  COPD (chronic obstructive pulmonary disease) (CMS-HCC)  Ductal carcinoma in situ (DCIS) of right breast   Past Surgical History:  Procedure Laterality Date  Bilateral Total Mastectomy, Bilateral Sentinel Lymph Node Biopsy, Port-A-Cath Placed 08/19/2021  Dr. Marlou Starks  CHOLECYSTECTOMY N/A  Date Unknown  Tubal Ligation Surgery N/A  Date Unknown    No Known Allergies  Current Outpatient Medications on File Prior to Visit  Medication Sig Dispense Refill  albuterol 90 mcg/actuation inhaler INHALE 2 PUFFS INTO THE LUNGS EVERY  6 HOURS AS NEEDED FOR WHEEZE  clonazePAM (KLONOPIN) 0.5 MG tablet Take 0.5 mg by mouth 3 (three) times daily as needed  clopidogreL (PLAVIX) 75 mg tablet TAKE 1 TABLET EVERY DAY TO PREVENT STROKES  cyclobenzaprine (FLEXERIL) 10 MG tablet Take 1 tablet by mouth 3 (three) times daily as needed  fluticasone propionate (FLONASE) 50 mcg/actuation nasal spray USE 1-2 SPRAYS IN EACH NOSTRIL DAILY  gabapentin (NEURONTIN) 600 MG tablet TAKE ONE TABLET THREE TIMES A DAY FOR BACK PAIN.  HYDROcodone-acetaminophen (NORCO) 5-325 mg tablet Take by mouth  methocarbamoL (ROBAXIN) 500 MG tablet Take by mouth  pantoprazole (PROTONIX) 40 MG DR tablet TAKE 1 TABLET TWICE A DAY FOR ACID REFLUX  promethazine (PHENERGAN) 25 MG tablet TAKE 1/2 TO ONE TABLET EVERY 4 HOURS AS NEEDED FOR NAUSEA.  simvastatin (ZOCOR) 40 MG tablet TAKE 1 TABLET EVERY EVENING TO CONTROL CHOLESTEROL  tiotropium (SPIRIVA WITH HANDIHALER) 18 mcg inhalation capsule INHALE CONTENTS OF 1 CAPSULE EVERY DAY VIA HANDIHALER  traZODone (DESYREL) 300 MG tablet Take by mouth  varenicline (CHANTIX STARTING MONTH PAK) tablet TAKE ONE 0.5 MG TAB BY MOUTH ONCE DAILY FOR 3 DAYS, INCREASE TO ONE 0.5 MG TAB TWICE DAILY FOR 4 DAYS, INCREASE TO 1 MG TAB TWICE DAILY.  venlafaxine (EFFEXOR-XR) 150 MG XR capsule Take by mouth   No current facility-administered medications on file prior to visit.   Family History  Problem Relation Age of Onset  Coronary Artery Disease (Blocked arteries around heart) Mother  Myocardial Infarction (Heart attack) Mother  Migraines Mother  High blood pressure (Hypertension) Mother  Diabetes Father  Hyperlipidemia (Elevated cholesterol) Father  High blood pressure (Hypertension) Father  Obesity  Father  Coronary Artery Disease (Blocked arteries around heart) Sister  Hyperlipidemia (Elevated cholesterol) Sister  High blood pressure (Hypertension) Sister  Obesity Sister  Obesity Brother  High blood pressure (Hypertension) Brother   Hyperlipidemia (Elevated cholesterol) Brother  Breast cancer Other    Social History   Tobacco Use  Smoking Status Every Day  Packs/day: .25  Types: Cigarettes  Smokeless Tobacco Never    Social History   Socioeconomic History  Marital status: Unknown  Tobacco Use  Smoking status: Every Day  Packs/day: .25  Types: Cigarettes  Smokeless tobacco: Never  Vaping Use  Vaping Use: Never used  Substance and Sexual Activity  Alcohol use: Never  Drug use: Yes  Types: Marijuana  Comment: marijuana   Objective:   Vitals:  PainSc: 0-No pain   There is no height or weight on file to calculate BMI.  Physical Exam Vitals reviewed.  Constitutional:  General: She is not in acute distress. Appearance: Normal appearance.  HENT:  Head: Normocephalic and atraumatic.  Right Ear: External ear normal.  Left Ear: External ear normal.  Nose: Nose normal.  Mouth/Throat:  Mouth: Mucous membranes are moist.  Pharynx: Oropharynx is clear.  Eyes:  General: No scleral icterus. Extraocular Movements: Extraocular movements intact.  Conjunctiva/sclera: Conjunctivae normal.  Pupils: Pupils are equal, round, and reactive to light.  Cardiovascular:  Rate and Rhythm: Normal rate and regular rhythm.  Pulses: Normal pulses.  Heart sounds: Normal heart sounds.  Pulmonary:  Effort: Pulmonary effort is normal. No respiratory distress.  Breath sounds: Normal breath sounds.  Abdominal:  General: Bowel sounds are normal.  Palpations: Abdomen is soft.  Tenderness: There is no abdominal tenderness.  Musculoskeletal:  General: No swelling, tenderness or deformity. Normal range of motion.  Cervical back: Normal range of motion and neck supple.  Skin: General: Skin is warm and dry.  Coloration: Skin is not jaundiced.  Neurological:  General: No focal deficit present.  Mental Status: She is alert and oriented to person, place, and time.  Psychiatric:  Mood and Affect: Mood normal.   Behavior: Behavior normal.     Breast: Both mastectomy incisions are healing nicely with no sign of infection. The chest wall skin is healthy and viable. There is no sign of seroma at this point. There is no palpable mass of either chest wall. There is no palpable axillary, supraclavicular, or cervical lymphadenopathy.  Labs, Imaging and Diagnostic Testing:  Assessment and Plan:   Diagnoses and all orders for this visit:  Ductal carcinoma in situ (DCIS) of right breast  Malignant neoplasm of upper-outer quadrant of left breast in female, estrogen receptor positive     The patient is about 1-1/2 years status post right mastectomy for ductal carcinoma in situ and left mastectomy for invasive cancer. She continues to do well with no clinical evidence for recurrence. At this point she will continue to take letrozole. She will continue to do regular self exams. She would like to have her port removed. She will need to be off Plavix to have this done. We will contact her neurologist to see if this is possible. I have discussed with her in detail the risks and benefits of the operation to remove the port as well as some of the technical aspects and she understands and wishes to proceed. Otherwise I will see her back in about 6 months.

## 2022-12-01 NOTE — Op Note (Signed)
12/01/2022  2:14 PM  PATIENT:  April [name]  67 y.o. female  PRE-OPERATIVE DIAGNOSIS:  BILATERAL BREAST CANCER  POST-OPERATIVE DIAGNOSIS:  BILATERAL BREAST CANCER  PROCEDURE:  Procedure(s): REMOVAL PORT-A-CATH (N/A)  SURGEON:  Surgeon(s) and Role:    * [name], MD - Primary  PHYSICIAN ASSISTANT:   ASSISTANTS: none   ANESTHESIA:   local and IV sedation  EBL:  2 mL   BLOOD ADMINISTERED:none  DRAINS: none   LOCAL MEDICATIONS USED:  MARCAINE     SPECIMEN:  No Specimen  DISPOSITION OF SPECIMEN:  N/A  COUNTS:  YES  TOURNIQUET:  * No tourniquets in log *  DICTATION: .Dragon Dictation  After informed consent was obtained the patient was brought to the operating room and placed in the supine position on the operating table.  After adequate IV sedation had been given the patient's right chest was prepped with ChloraPrep, allowed to dry, and draped in usual sterile manner.  An appropriate timeout was performed.  The area around the port was infiltrated with quarter percent Marcaine.  A small incision was made with a 15 blade knife through her previous incision.  The incision was carried through the subcutaneous tissue sharply with a 15 blade knife until the capsule surrounding the port was opened.  The 2 anchoring stitches were divided and removed.  The port was then gently pushed out of its pocket and with gentle traction was removed from the patient without difficulty.  Pressure was held for several minutes until the area was completely hemostatic.  The tubing tract was closed with a figure-of-eight 3-0 Vicryl stitch.  The subcutaneous tissue was closed with interrupted 3-0 Vicryl stitches.  The skin was then closed with interrupted 4-0 Monocryl subcuticular stitches.  Dermabond dressings were applied.  The patient tolerated the procedure well.  At the end of the case all needle sponge and instrument counts were correct.  The patient was then awakened and taken to recovery in  stable condition.  PLAN OF CARE: Discharge to home after PACU  PATIENT DISPOSITION:  PACU - hemodynamically stable.   Delay start of Pharmacological VTE agent (>24hrs) due to surgical blood loss or risk of bleeding: not applicable

## 2022-12-01 NOTE — Interval H&P Note (Signed)
History and Physical Interval Note:  12/01/2022 1:11 PM  April [name]  has presented today for surgery, with the diagnosis of BILATERAL BREAST CANCER.  The various methods of treatment have been discussed with the patient and family. After consideration of risks, benefits and other options for treatment, the patient has consented to  Procedure(s): REMOVAL PORT-A-CATH (N/A) as a surgical intervention.  The patient's history has been reviewed, patient examined, no change in status, stable for surgery.  I have reviewed the patient's chart and labs.  Questions were answered to the patient's satisfaction.     [name]

## 2022-12-02 ENCOUNTER — Encounter (HOSPITAL_COMMUNITY): Payer: Self-pay | Admitting: General Surgery

## 2022-12-11 ENCOUNTER — Other Ambulatory Visit: Payer: Self-pay | Admitting: Hematology and Oncology

## 2023-01-06 ENCOUNTER — Ambulatory Visit (HOSPITAL_BASED_OUTPATIENT_CLINIC_OR_DEPARTMENT_OTHER): Payer: PPO | Admitting: Pulmonary Disease

## 2023-01-16 ENCOUNTER — Encounter: Payer: Self-pay | Admitting: Hematology and Oncology

## 2023-02-03 ENCOUNTER — Ambulatory Visit (HOSPITAL_BASED_OUTPATIENT_CLINIC_OR_DEPARTMENT_OTHER): Payer: PPO | Admitting: Pulmonary Disease

## 2023-02-28 NOTE — Progress Notes (Signed)
Patient Care Team: Barbie Banner, MD as PCP - General (Family Medicine) Pershing Proud, RN as Oncology Nurse Navigator Donnelly Angelica, RN as Oncology Nurse Navigator Griselda Miner, MD as Consulting Physician (General Surgery) Serena Croissant, MD as Consulting Physician (Hematology and Oncology) Dorothy Puffer, MD as Consulting Physician (Radiation Oncology)  DIAGNOSIS:  Encounter Diagnosis  Name Primary?   Malignant neoplasm of upper-outer quadrant of left breast in female, estrogen receptor positive (HCC) Yes    SUMMARY OF ONCOLOGIC HISTORY: Oncology History  Ductal carcinoma in situ (DCIS) of right breast  06/28/2021 Initial Diagnosis   Palpable mass in the left breast. Diagnostic mammogram and [place]: suspicious 1.7 cm mass in the left breast at the 1 o'clock position and 2 subtle areas of distortion in the upper outer right breast.  Left breast biopsy: Grade 3 IDC high grade focal DCIS Her2+/ER+(90%)/PR+(70%) in the left breast, and DCIS involving sclerosing adenosis with calcifications ER+(100%)/PR+(70%) in the right breast.    07/08/2021 Initial Diagnosis   Ductal carcinoma in situ (DCIS) of right breast   08/19/2021 Surgery   Right mastectomy: Foci of intermediate to high-grade DCIS, sclerosing adenosis and complex sclerosing lesion, margins negative, 0/4 lymph nodes negative, ER 100%, PR 70% Left mastectomy: 2.5 cm grade 3 IDC with DCIS high-grade, margins negative, 0/4 lymph nodes negative ER 90%, PR 70%, HER2 positive, Ki-67 40%   Malignant neoplasm of upper-outer quadrant of left breast in female, estrogen receptor positive (HCC)  06/28/2021 Initial Diagnosis   Palpable mass in the left breast. Diagnostic mammogram and [place]: suspicious 1.7 cm mass in the left breast at the 1 o'clock position and 2 subtle areas of distortion in the upper outer right breast.  Left breast biopsy: Grade 3 IDC high grade focal DCIS Her2+/ER+(90%)/PR+(70%) in the left breast, and DCIS involving sclerosing  adenosis with calcifications ER+(100%)/PR+(70%) in the right breast.    07/10/2021 Cancer Staging   Staging form: Breast, AJCC 8th Edition - Clinical stage from 07/10/2021: Stage IA (cT1c, cN0, cM0, G3, ER+, PR+, HER2+) - Signed by Serena Croissant, MD on 07/10/2021 Stage prefix: Initial diagnosis Histologic grading system: 3 grade system    Genetic Testing   [facility] CustomNext (47 gene) Panel was negative. No pathogenic variants were identified. The report date is 07/23/2021.  The CustomNext-Cancer+RNAinsight panel offered by [employer] [facility] includes sequencing and rearrangement analysis for the following 47 genes:  APC, ATM, AXIN2, BARD1, BMPR1A, BRCA1, BRCA2, BRIP1, CDH1, CDK4, CDKN2A, CHEK2, DICER1, EPCAM, GREM1, HOXB13, MEN1, MLH1, MSH2, MSH3, MSH6, MUTYH, NBN, NF1, NF2, NTHL1, PALB2, PMS2, POLD1, POLE, PTEN, RAD51C, RAD51D, RECQL, RET, SDHA, SDHAF2, SDHB, SDHC, SDHD, SMAD4, SMARCA4, STK11, TP53, TSC1, TSC2, and VHL.  RNA data is routinely analyzed for use in variant interpretation for all genes.   Bilateral breast cancer (HCC)  08/19/2021 Initial Diagnosis   Bilateral breast cancer (HCC)   10/02/2021 - 07/24/2022 Chemotherapy   Patient is on Treatment Plan : BREAST Trastuzumab q21d x 13 cycles     10/02/2021 -  Chemotherapy   Patient is on Treatment Plan : BREAST Trastuzumab IV (8/6) or SQ (600) D1 q21d       CHIEF COMPLIANT: Follow-up bilateral breast cancer on herceptin and letrozole    INTERVAL HISTORY: April [name] is a 67 y.o. with above-mentioned history of bilateral breast cancer having undergone bilateral mastectomies, currently on herceptin and letrozole.  She presents to the clinic for a follow-up. She reports coughing up phlegm in the last couple of weeks. She  denies any fever. She does have some mild hot flashes. She says it is tolerable. The right hand locks up. She doesn't really use the left hand. She had noticed a knot under the left axilla, but she believes it is  gone.   ALLERGIES:  has No Known Allergies.  MEDICATIONS:  Current Outpatient Medications  Medication Sig Dispense Refill   acetaminophen (TYLENOL) 500 MG tablet Take 1,000 mg by mouth every 8 (eight) hours as needed for moderate pain.     albuterol (VENTOLIN HFA) 108 (90 Base) MCG/ACT inhaler Inhale 2 puffs into the lungs every 6 (six) hours as needed for wheezing or shortness of breath.     clonazePAM (KLONOPIN) 0.5 MG tablet Take 0.5 mg by mouth in the morning, at noon, and at bedtime.  5   clopidogrel (PLAVIX) 75 MG tablet Take 75 mg by mouth daily.     fluticasone (FLONASE) 50 MCG/ACT nasal spray Place 2 sprays into both nostrils daily as needed for allergies or rhinitis.     fluticasone-salmeterol (ADVAIR) 100-50 MCG/ACT AEPB Inhale one dose twice a day for lung disease     Fluticasone-Umeclidin-Vilant (TRELEGY ELLIPTA) 100-62.5-25 MCG/ACT AEPB Inhale 1 puff into the lungs daily. 60 each 5   furosemide (LASIX) 20 MG tablet Take 20 mg by mouth daily as needed for edema.     HYDROcodone-acetaminophen (NORCO/VICODIN) 5-325 MG tablet Take 1 tablet by mouth 2 (two) times daily as needed for moderate pain.     HYDROcodone-acetaminophen (NORCO/VICODIN) 5-325 MG tablet Take 1 tablet by mouth every 4 (four) hours as needed for moderate pain. 10 tablet 0   letrozole (FEMARA) 2.5 MG tablet Take 1 tablet (2.5 mg total) by mouth daily. 90 tablet 3   montelukast (SINGULAIR) 10 MG tablet Take 10 mg by mouth daily as needed (allergies).     pantoprazole (PROTONIX) 40 MG tablet Take 40 mg by mouth 2 (two) times daily.     promethazine (PHENERGAN) 25 MG tablet Take 25 mg by mouth every 8 (eight) hours as needed for nausea or vomiting.     simvastatin (ZOCOR) 40 MG tablet Take 40 mg by mouth daily.     sodium chloride (OCEAN) 0.65 % SOLN nasal spray Place 2 sprays into both nostrils 3 (three) times daily as needed for congestion.      traZODone (DESYREL) 150 MG tablet Take 300 mg by mouth at bedtime.      varenicline (CHANTIX) 0.5 MG tablet TAKE 1 TABLET BY MOUTH TWICE A DAY 180 tablet 1   venlafaxine XR (EFFEXOR-XR) 150 MG 24 hr capsule Take 150 mg by mouth daily with breakfast.     No current facility-administered medications for this visit.    PHYSICAL EXAMINATION: ECOG PERFORMANCE STATUS: 1 - Symptomatic but completely ambulatory  Vitals:   03/05/23 1520 03/05/23 1522  BP: (!) 136/57   Pulse: 89   Resp: 17   Temp: (!) 97.5 F (36.4 C)   SpO2: (!) 80% (!) 85%   Filed Weights   03/05/23 1520  Weight: 240 lb 11.2 oz (109.2 kg)    BREAST: No palpable masses or nodules in bilateral chest wall or axilla. (exam performed in the presence of a chaperone)  LABORATORY DATA:  I have reviewed the data as listed    Latest Ref Rng & Units 10/01/2022    4:18 AM 09/30/2022    5:15 PM 08/15/2022    1:20 PM  CMP  Glucose 70 - 99 mg/dL 161  096  045  BUN 8 - 23 mg/dL 12  10  13    Creatinine 0.44 - 1.00 mg/dL 1.61  0.96  0.45   Sodium 135 - 145 mmol/L 140  142  138   Potassium 3.5 - 5.1 mmol/L 4.6  3.9  3.9   Chloride 98 - 111 mmol/L 97  100  100   CO2 22 - 32 mmol/L 35  35  34   Calcium 8.9 - 10.3 mg/dL 8.4  8.4  9.1   Total Protein 6.5 - 8.1 g/dL 6.3   6.9   Total Bilirubin 0.3 - 1.2 mg/dL 0.3   0.5   Alkaline Phos 38 - 126 U/L 32   34   AST 15 - 41 U/L 10   11   ALT 0 - 44 U/L 10   9     Lab Results  Component Value Date   WBC 8.7 10/01/2022   HGB 13.4 10/01/2022   HCT 45.6 10/01/2022   MCV 90.1 10/01/2022   PLT 173 10/01/2022   NEUTROABS 6.2 09/30/2022    ASSESSMENT & PLAN:  Malignant neoplasm of upper-outer quadrant of left breast in female, estrogen receptor positive (HCC) 08/19/2021: Bilateral mastectomies Right mastectomy: Foci of intermediate to high-grade DCIS, sclerosing adenosis and complex sclerosing lesion, margins negative, 0/4 lymph nodes negative, ER 100%, PR 70% Left mastectomy: 2.5 cm grade 3 IDC with DCIS high-grade, margins negative, 0/4 lymph nodes  negative ER 90%, PR 70%, HER2 positive, Ki-67 40% --------------------------------------------------------------------------------------------------------------------------------------------- Prior treatment: Herceptin 10/02/2021-09/25/2022 (patient rejected chemotherapy)   Herceptin toxicities: None.   Letrozole Toxicities:  1.  Severe fatigue 2. weight gain: Discussed weight loss 3.  Hot flashes: Stable    Breast cancer surveillance: No role of mammograms since she had bilateral mastectomies. Chest wall exam 03/05/2023: Benign   Chronic shortness of breath due to COPD: Patient uses oxygen at home.  She did not bring her oxygen and did not want [place] to look around for oxygen.  Her O2 sats were 80 to 85% but she said she did not want oxygen while she was here.  Return to clinic in 1 year for follow-up    No orders of the defined types were placed in this encounter.  The patient has a good understanding of the overall plan. she agrees with it. she will call with any problems that may develop before the next visit here. Total time spent: 30 mins including face to face time and time spent for planning, charting and co-ordination of care   Tamsen Meek, MD 03/05/23    I April [name] am acting as a Neurosurgeon for [employer]  I have reviewed the above documentation for accuracy and completeness, and I agree with the above.

## 2023-03-02 ENCOUNTER — Encounter (HOSPITAL_BASED_OUTPATIENT_CLINIC_OR_DEPARTMENT_OTHER): Payer: Self-pay | Admitting: Pulmonary Disease

## 2023-03-02 ENCOUNTER — Ambulatory Visit (HOSPITAL_BASED_OUTPATIENT_CLINIC_OR_DEPARTMENT_OTHER): Payer: PPO | Admitting: Pulmonary Disease

## 2023-03-05 ENCOUNTER — Other Ambulatory Visit: Payer: Self-pay

## 2023-03-05 ENCOUNTER — Inpatient Hospital Stay: Payer: PPO | Attending: Hematology and Oncology | Admitting: Hematology and Oncology

## 2023-03-05 VITALS — BP 136/57 | HR 89 | Temp 97.5°F | Resp 17 | Ht 63.0 in | Wt 240.7 lb

## 2023-03-05 DIAGNOSIS — Z170 Estrogen receptor positive status [ER+]: Secondary | ICD-10-CM | POA: Insufficient documentation

## 2023-03-05 DIAGNOSIS — Z9013 Acquired absence of bilateral breasts and nipples: Secondary | ICD-10-CM | POA: Insufficient documentation

## 2023-03-05 DIAGNOSIS — Z79899 Other long term (current) drug therapy: Secondary | ICD-10-CM | POA: Diagnosis not present

## 2023-03-05 DIAGNOSIS — Z79811 Long term (current) use of aromatase inhibitors: Secondary | ICD-10-CM | POA: Insufficient documentation

## 2023-03-05 DIAGNOSIS — D0511 Intraductal carcinoma in situ of right breast: Secondary | ICD-10-CM | POA: Diagnosis not present

## 2023-03-05 DIAGNOSIS — C50412 Malignant neoplasm of upper-outer quadrant of left female breast: Secondary | ICD-10-CM | POA: Insufficient documentation

## 2023-03-05 NOTE — Assessment & Plan Note (Addendum)
08/19/2021: Bilateral mastectomies Right mastectomy: Foci of intermediate to high-grade DCIS, sclerosing adenosis and complex sclerosing lesion, margins negative, 0/4 lymph nodes negative, ER 100%, PR 70% Left mastectomy: 2.5 cm grade 3 IDC with DCIS high-grade, margins negative, 0/4 lymph nodes negative ER 90%, PR 70%, HER2 positive, Ki-67 40% --------------------------------------------------------------------------------------------------------------------------------------------- Prior treatment: Herceptin 10/02/2021-09/25/2022 (patient rejected chemotherapy)   Herceptin toxicities: None.   Letrozole Toxicities:  1.  Severe fatigue 2. weight gain: Discussed weight loss 3.  Hot flashes: Stable    Breast cancer surveillance: No role of mammograms since she had bilateral mastectomies. Chest wall exam 03/05/2023: Benign   Return to clinic in 1 year for follow-up

## 2023-03-06 ENCOUNTER — Telehealth: Payer: Self-pay | Admitting: Hematology and Oncology

## 2023-03-06 NOTE — Telephone Encounter (Signed)
Scheduled appointment per 5/9 los. Left voicemail.  

## 2023-03-08 ENCOUNTER — Other Ambulatory Visit: Payer: Self-pay

## 2023-06-11 ENCOUNTER — Other Ambulatory Visit (HOSPITAL_BASED_OUTPATIENT_CLINIC_OR_DEPARTMENT_OTHER): Payer: Self-pay | Admitting: Pulmonary Disease

## 2023-07-13 ENCOUNTER — Ambulatory Visit (HOSPITAL_BASED_OUTPATIENT_CLINIC_OR_DEPARTMENT_OTHER): Payer: PPO | Admitting: Pulmonary Disease

## 2023-07-13 ENCOUNTER — Encounter (HOSPITAL_BASED_OUTPATIENT_CLINIC_OR_DEPARTMENT_OTHER): Payer: Self-pay | Admitting: Pulmonary Disease

## 2023-09-03 DIAGNOSIS — J0140 Acute pansinusitis, unspecified: Secondary | ICD-10-CM | POA: Insufficient documentation

## 2023-09-21 ENCOUNTER — Encounter: Payer: Self-pay | Admitting: Hematology and Oncology

## 2023-09-28 ENCOUNTER — Inpatient Hospital Stay: Payer: PPO | Attending: Hematology and Oncology | Admitting: Hematology and Oncology

## 2023-09-28 NOTE — Assessment & Plan Note (Signed)
08/19/2021: Bilateral mastectomies Right mastectomy: Foci of intermediate to high-grade DCIS, sclerosing adenosis and complex sclerosing lesion, margins negative, 0/4 lymph nodes negative, ER 100%, PR 70% Left mastectomy: 2.5 cm grade 3 IDC with DCIS high-grade, margins negative, 0/4 lymph nodes negative ER 90%, PR 70%, HER2 positive, Ki-67 40% --------------------------------------------------------------------------------------------------------------------------------------------- Prior treatment: Herceptin 10/02/2021-09/25/2022 (patient rejected chemotherapy)   Herceptin toxicities: None.   Letrozole Toxicities:  1.  Severe fatigue 2. weight gain: Discussed weight loss 3.  Hot flashes: Stable    Breast cancer surveillance: No role of mammograms since she had bilateral mastectomies. Chest wall exam 03/05/2023: Benign   Chronic shortness of breath due to COPD: Patient uses oxygen at home. Return to clinic in 1 year for follow-up

## 2023-10-06 ENCOUNTER — Ambulatory Visit (HOSPITAL_BASED_OUTPATIENT_CLINIC_OR_DEPARTMENT_OTHER): Payer: PPO | Admitting: Pulmonary Disease

## 2023-10-16 ENCOUNTER — Emergency Department (HOSPITAL_COMMUNITY): Payer: PPO

## 2023-10-16 ENCOUNTER — Inpatient Hospital Stay (HOSPITAL_COMMUNITY)
Admission: EM | Admit: 2023-10-16 | Discharge: 2023-10-26 | DRG: 064 | Disposition: A | Discharge status: Skilled Nursing Facility | Payer: PPO | Attending: Internal Medicine | Admitting: Internal Medicine

## 2023-10-16 ENCOUNTER — Other Ambulatory Visit: Payer: Self-pay

## 2023-10-16 DIAGNOSIS — R29810 Facial weakness: Secondary | ICD-10-CM | POA: Diagnosis present

## 2023-10-16 DIAGNOSIS — Z9981 Dependence on supplemental oxygen: Secondary | ICD-10-CM | POA: Diagnosis not present

## 2023-10-16 DIAGNOSIS — I5032 Chronic diastolic (congestive) heart failure: Secondary | ICD-10-CM | POA: Diagnosis present

## 2023-10-16 DIAGNOSIS — J9621 Acute and chronic respiratory failure with hypoxia: Secondary | ICD-10-CM | POA: Diagnosis present

## 2023-10-16 DIAGNOSIS — Z6841 Body Mass Index (BMI) 40.0 and over, adult: Secondary | ICD-10-CM | POA: Diagnosis not present

## 2023-10-16 DIAGNOSIS — G8191 Hemiplegia, unspecified affecting right dominant side: Secondary | ICD-10-CM | POA: Diagnosis present

## 2023-10-16 DIAGNOSIS — Y92009 Unspecified place in unspecified non-institutional (private) residence as the place of occurrence of the external cause: Secondary | ICD-10-CM

## 2023-10-16 DIAGNOSIS — I610 Nontraumatic intracerebral hemorrhage in hemisphere, subcortical: Principal | ICD-10-CM | POA: Diagnosis present

## 2023-10-16 DIAGNOSIS — Z8709 Personal history of other diseases of the respiratory system: Secondary | ICD-10-CM | POA: Diagnosis not present

## 2023-10-16 DIAGNOSIS — W19XXXA Unspecified fall, initial encounter: Secondary | ICD-10-CM | POA: Diagnosis not present

## 2023-10-16 DIAGNOSIS — R29724 NIHSS score 24: Secondary | ICD-10-CM | POA: Diagnosis present

## 2023-10-16 DIAGNOSIS — J9622 Acute and chronic respiratory failure with hypercapnia: Secondary | ICD-10-CM | POA: Diagnosis present

## 2023-10-16 DIAGNOSIS — F32A Depression, unspecified: Secondary | ICD-10-CM | POA: Diagnosis present

## 2023-10-16 DIAGNOSIS — Z853 Personal history of malignant neoplasm of breast: Secondary | ICD-10-CM

## 2023-10-16 DIAGNOSIS — I110 Hypertensive heart disease with heart failure: Secondary | ICD-10-CM | POA: Diagnosis present

## 2023-10-16 DIAGNOSIS — Z79899 Other long term (current) drug therapy: Secondary | ICD-10-CM

## 2023-10-16 DIAGNOSIS — M1990 Unspecified osteoarthritis, unspecified site: Secondary | ICD-10-CM | POA: Diagnosis present

## 2023-10-16 DIAGNOSIS — I959 Hypotension, unspecified: Secondary | ICD-10-CM | POA: Diagnosis not present

## 2023-10-16 DIAGNOSIS — F17210 Nicotine dependence, cigarettes, uncomplicated: Secondary | ICD-10-CM | POA: Diagnosis present

## 2023-10-16 DIAGNOSIS — I10 Essential (primary) hypertension: Secondary | ICD-10-CM | POA: Diagnosis not present

## 2023-10-16 DIAGNOSIS — Z66 Do not resuscitate: Secondary | ICD-10-CM | POA: Diagnosis present

## 2023-10-16 DIAGNOSIS — J449 Chronic obstructive pulmonary disease, unspecified: Secondary | ICD-10-CM | POA: Diagnosis present

## 2023-10-16 DIAGNOSIS — Z7902 Long term (current) use of antithrombotics/antiplatelets: Secondary | ICD-10-CM

## 2023-10-16 DIAGNOSIS — Z9049 Acquired absence of other specified parts of digestive tract: Secondary | ICD-10-CM | POA: Diagnosis not present

## 2023-10-16 DIAGNOSIS — K219 Gastro-esophageal reflux disease without esophagitis: Secondary | ICD-10-CM | POA: Diagnosis present

## 2023-10-16 DIAGNOSIS — I6521 Occlusion and stenosis of right carotid artery: Secondary | ICD-10-CM | POA: Diagnosis present

## 2023-10-16 DIAGNOSIS — R1310 Dysphagia, unspecified: Secondary | ICD-10-CM | POA: Diagnosis present

## 2023-10-16 DIAGNOSIS — Z79811 Long term (current) use of aromatase inhibitors: Secondary | ICD-10-CM

## 2023-10-16 DIAGNOSIS — F419 Anxiety disorder, unspecified: Secondary | ICD-10-CM | POA: Diagnosis present

## 2023-10-16 DIAGNOSIS — E785 Hyperlipidemia, unspecified: Secondary | ICD-10-CM | POA: Diagnosis present

## 2023-10-16 DIAGNOSIS — Z803 Family history of malignant neoplasm of breast: Secondary | ICD-10-CM

## 2023-10-16 DIAGNOSIS — R531 Weakness: Secondary | ICD-10-CM | POA: Diagnosis present

## 2023-10-16 DIAGNOSIS — Z9013 Acquired absence of bilateral breasts and nipples: Secondary | ICD-10-CM

## 2023-10-16 DIAGNOSIS — I619 Nontraumatic intracerebral hemorrhage, unspecified: Principal | ICD-10-CM | POA: Diagnosis present

## 2023-10-16 DIAGNOSIS — Z8249 Family history of ischemic heart disease and other diseases of the circulatory system: Secondary | ICD-10-CM

## 2023-10-16 LAB — CBC
HCT: 49.7 % — ABNORMAL HIGH (ref 36.0–46.0)
Hemoglobin: 15.3 g/dL — ABNORMAL HIGH (ref 12.0–15.0)
MCH: 27.9 pg (ref 26.0–34.0)
MCHC: 30.8 g/dL (ref 30.0–36.0)
MCV: 90.5 fL (ref 80.0–100.0)
Platelets: 174 10*3/uL (ref 150–400)
RBC: 5.49 MIL/uL — ABNORMAL HIGH (ref 3.87–5.11)
RDW: 14.6 % (ref 11.5–15.5)
WBC: 9.7 10*3/uL (ref 4.0–10.5)
nRBC: 0 % (ref 0.0–0.2)

## 2023-10-16 LAB — PROTIME-INR
INR: 1 (ref 0.8–1.2)
Prothrombin Time: 13.2 s (ref 11.4–15.2)

## 2023-10-16 LAB — COMPREHENSIVE METABOLIC PANEL
ALT: 12 U/L (ref 0–44)
AST: 13 U/L — ABNORMAL LOW (ref 15–41)
Albumin: 3.4 g/dL — ABNORMAL LOW (ref 3.5–5.0)
Alkaline Phosphatase: 35 U/L — ABNORMAL LOW (ref 38–126)
Anion gap: 9 (ref 5–15)
BUN: 10 mg/dL (ref 8–23)
CO2: 33 mmol/L — ABNORMAL HIGH (ref 22–32)
Calcium: 9.1 mg/dL (ref 8.9–10.3)
Chloride: 96 mmol/L — ABNORMAL LOW (ref 98–111)
Creatinine, Ser: 0.9 mg/dL (ref 0.44–1.00)
GFR, Estimated: >60 mL/min (ref >60)
Glucose, Bld: 110 mg/dL — ABNORMAL HIGH (ref 70–99)
Potassium: 3.7 mmol/L (ref 3.5–5.1)
Sodium: 138 mmol/L (ref 135–145)
Total Bilirubin: 0.5 mg/dL (ref <1.2)
Total Protein: 6.8 g/dL (ref 6.5–8.1)

## 2023-10-16 LAB — DIFFERENTIAL
Abs Immature Granulocytes: 0.05 10*3/uL (ref 0.00–0.07)
Basophils Absolute: 0 10*3/uL (ref 0.0–0.1)
Basophils Relative: 0 %
Eosinophils Absolute: 0.1 10*3/uL (ref 0.0–0.5)
Eosinophils Relative: 1 %
Immature Granulocytes: 1 %
Lymphocytes Relative: 13 %
Lymphs Abs: 1.2 10*3/uL (ref 0.7–4.0)
Monocytes Absolute: 0.5 10*3/uL (ref 0.1–1.0)
Monocytes Relative: 5 %
Neutro Abs: 7.8 10*3/uL — ABNORMAL HIGH (ref 1.7–7.7)
Neutrophils Relative %: 80 %

## 2023-10-16 LAB — ETHANOL: Alcohol, Ethyl (B): <10 mg/dL (ref <10)

## 2023-10-16 LAB — APTT: aPTT: 23 s — ABNORMAL LOW (ref 24–36)

## 2023-10-16 MED ORDER — IOHEXOL 350 MG/ML SOLN
75.0000 mL | Freq: Once | INTRAVENOUS | Status: AC | PRN
  Administered 2023-10-16: 75 mL via INTRAVENOUS

## 2023-10-16 NOTE — Consult Note (Signed)
[facility] Tele[facility]urology Consult Services   Patient Name:   April [name], April [name] Date of Birth:   20-Jul-1956 Identification Number:   MRN - [id number] Date of Service:   10/16/2023 23:30:46  Diagnosis:       I61.8 - Other intracerebral hemorrhage  Impression: Patient presented with right sided weakness. Head CT shows a subcortical hemorrhage. Fortunately patient was able to describe the sequence of event. Would recommend aggressive blood pressure control and repeat head ct with neurosurgical evaluation.  Recommendation:  Diagnostic Studies:      Repeat CT head in first 8-12hrs  Laboratory Studies:       INR/PT       aPTT?       CBC  Nursing Recommendations:       Telemetry, IV Fluids?Avoid dextrose containing fluids, Maintain euglycemia       Head of bed 30 degrees       Neuro checks q1-2?hrs?during ICU stay       Once stable neuro checks q4?hrs       keep BP less than 140/90's with goal of 130/80s  Consultations:       Need Neurosurgery consultation?STAT       Recommend Speech therapy if failed dysphagia screen       Physical therapy/Occupational therapy  DVT Prophylaxis:       SCDs  Disposition:       Neurology will Follow  ------------------------------------------------------------------------------  Metrics: Last Known Well: 10/16/2023 16:00:00 Dispatch Time: 10/16/2023 23:30:46 Arrival Time: 10/16/2023 21:54:00 Initial Response Time: 10/16/2023 23:33:57 Symptoms: fall. Initial patient interaction: 10/16/2023 23:36:00 NIHSS Assessment Completed: 10/16/2023 23:41:15 Patient is not a candidate for Thrombolytic. Thrombolytic Medical Decision: 10/16/2023 23:41:16 Patient was not deemed candidate for Thrombolytic because of following reasons: LKW outside 4.5 hr window. . History of previous intracranial hemorrhage, intracranial neoplasm .  I personally Reviewed the CT Head and it Showed 1. Intraparenchymal hematoma within the left basal ganglia  that measures 2.3 x 1.8 x 2.3 cm. No midline shift or other mass effect. 2. 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 3. 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 4. Patent bilateral vertebral artery origin stents.   Primary Provider Notified of Diagnostic Impression and Management Plan on: 10/16/2023 23:46:25    History of Present Illness: Patient is a 67 year old Female.  Patient was brought by EMS for symptoms of fall. Past Medical history of hypertension presented with right sided weakness. History was provided by the patient at bedside. Last seen normal was around 1600. Patient had a fall had trouble using her right side of her body. Head CT shows an intracerebral hemorrhage.    Past Medical History:      Hypertension  Medications:  No Anticoagulant use  Antiplatelet use: Yes Plavix Reviewed EMR for current medications  Allergies:  Reviewed  Social History: Drug Use: No  Family History:  There is no family history of premature cerebrovascular disease pertinent to this consultation  ROS : 14 Points Review of Systems was performed and was negative except mentioned in HPI.  Past Surgical History: There Is No Surgical History Contributory To Today's Visit     Examination: BP(138/77), Pulse(75), 1A: Level of Consciousness - Requires repeated stimulation to arouse + 2 1B: Ask Month and Age - Both Questions Right + 0 1C: Blink Eyes & Squeeze Hands - Performs Both Tasks + 0 2: Test Horizontal Extraocular Movements - Normal +  0 3: Test Visual Fields - No Visual Loss + 0 4: Test Facial Palsy (Use Grimace if Obtunded) - Normal symmetry + 0 5A: Test Left Arm Motor Drift - No Drift for 10 Seconds + 0 5B: Test Right Arm Motor Drift - Some Effort Against Gravity + 2 6A: Test Left Leg Motor Drift - No Drift for 5 Seconds + 0 6B: Test Right Leg  Motor Drift - Some Effort Against Gravity + 2 7: Test Limb Ataxia (FNF/Heel-Shin) - No Ataxia + 0 8: Test Sensation - Mild-Moderate Loss: Less Sharp/More Dull + 1 9: Test Language/Aphasia - Normal; No aphasia + 0 10: Test Dysarthria - Normal + 0 11: Test Extinction/Inattention - No abnormality + 0 NIHSS Score: 7  ICH Score: 0   GlasGow Coma Score: 13-15 (0)  Age >= 80: No (0)  ICH volume >= 30mL: No (0)  Intraventricular hemorrhage: No (0)  Infratentorial origin of hemorrhage: No (0)  Pre-Morbid Modified Rankin Scale: 0 Points = No symptoms at all   This consult was conducted in real time using interactive audio and Immunologist. Patient was informed of the technology being used for this visit and agreed to proceed. Patient located in hospital and provider located at home/office setting.  Due to the immediate potential for life-threatening deterioration due to underlying acute neurologic illness, I spent 30 minutes providing critical care. This time includes time for face to face visit via telemedicine, review of medical records, imaging studies and discussion of findings with providers, the patient and/or family.  Dr Delene Ruffini Mc-ONeil Keisa Blow  [facility] For Inpatient follow-up with [facility] physician please call [facility] at [phone]. As we are not an outpatient service for any post hospital discharge needs please contact the hospital for assistance. If you have any questions for the [facility] physicians or need to reconsult for clinical or diagnostic changes please contact us via [facility] at [phone].

## 2023-10-16 NOTE — ED Triage Notes (Addendum)
Pt bib [facility] after a fall. Per EMS pt was found in bathroom floor with right arm bent behind her. Pt states she passed out and was laying in floor for approx. 1 hour before EMS arrival. Pt has bruising to right upper arm and right elbow. Denies hitting head, is on blood thinners.  Pt states she feels weak on right right side. EDP made aware

## 2023-10-16 NOTE — ED Notes (Signed)
Patient transported to CT 

## 2023-10-16 NOTE — ED Notes (Signed)
Pt reports she has been weak on right side since around 1600 today. Dr. Elayne Snare at bedside

## 2023-10-16 NOTE — ED Notes (Signed)
ED Provider at bedside. 

## 2023-10-16 NOTE — Progress Notes (Signed)
2325 elert (after read was received at facility) 2330 page to ts 2333 Plancher on screen, read relayed   LKW 1600, ICH on read, MRS 0

## 2023-10-17 ENCOUNTER — Inpatient Hospital Stay (HOSPITAL_COMMUNITY): Payer: PPO

## 2023-10-17 DIAGNOSIS — I619 Nontraumatic intracerebral hemorrhage, unspecified: Secondary | ICD-10-CM | POA: Diagnosis present

## 2023-10-17 DIAGNOSIS — Z8709 Personal history of other diseases of the respiratory system: Secondary | ICD-10-CM

## 2023-10-17 DIAGNOSIS — W19XXXA Unspecified fall, initial encounter: Secondary | ICD-10-CM

## 2023-10-17 DIAGNOSIS — Y92009 Unspecified place in unspecified non-institutional (private) residence as the place of occurrence of the external cause: Secondary | ICD-10-CM

## 2023-10-17 LAB — POCT I-STAT 7, (LYTES, BLD GAS, ICA,H+H)
Acid-Base Excess: 8 mmol/L — ABNORMAL HIGH (ref 0.0–2.0)
Bicarbonate: 35 mmol/L — ABNORMAL HIGH (ref 20.0–28.0)
Calcium, Ion: 1.2 mmol/L (ref 1.15–1.40)
HCT: 42 % (ref 36.0–46.0)
Hemoglobin: 14.3 g/dL (ref 12.0–15.0)
O2 Saturation: 98 %
Patient temperature: 36.8
Potassium: 3.4 mmol/L — ABNORMAL LOW (ref 3.5–5.1)
Sodium: 139 mmol/L (ref 135–145)
TCO2: 37 mmol/L — ABNORMAL HIGH (ref 22–32)
pCO2 arterial: 54.2 mm[Hg] — ABNORMAL HIGH (ref 32–48)
pH, Arterial: 7.417 (ref 7.35–7.45)
pO2, Arterial: 115 mm[Hg] — ABNORMAL HIGH (ref 83–108)

## 2023-10-17 LAB — COMPREHENSIVE METABOLIC PANEL
ALT: 9 U/L (ref 0–44)
AST: 14 U/L — ABNORMAL LOW (ref 15–41)
Albumin: 2.8 g/dL — ABNORMAL LOW (ref 3.5–5.0)
Alkaline Phosphatase: 32 U/L — ABNORMAL LOW (ref 38–126)
Anion gap: 8 (ref 5–15)
BUN: 7 mg/dL — ABNORMAL LOW (ref 8–23)
CO2: 31 mmol/L (ref 22–32)
Calcium: 8.5 mg/dL — ABNORMAL LOW (ref 8.9–10.3)
Chloride: 103 mmol/L (ref 98–111)
Creatinine, Ser: 1.02 mg/dL — ABNORMAL HIGH (ref 0.44–1.00)
GFR, Estimated: >60 mL/min (ref >60)
Glucose, Bld: 103 mg/dL — ABNORMAL HIGH (ref 70–99)
Potassium: 3.5 mmol/L (ref 3.5–5.1)
Sodium: 142 mmol/L (ref 135–145)
Total Bilirubin: 0.6 mg/dL (ref <1.2)
Total Protein: 5.6 g/dL — ABNORMAL LOW (ref 6.5–8.1)

## 2023-10-17 LAB — URINALYSIS, ROUTINE W REFLEX MICROSCOPIC
Bilirubin Urine: NEGATIVE
Glucose, UA: NEGATIVE mg/dL
Hgb urine dipstick: NEGATIVE
Ketones, ur: NEGATIVE mg/dL
Leukocytes,Ua: NEGATIVE
Nitrite: NEGATIVE
Protein, ur: NEGATIVE mg/dL
Specific Gravity, Urine: >1.046 — ABNORMAL HIGH (ref 1.005–1.030)
pH: 5 (ref 5.0–8.0)

## 2023-10-17 LAB — GLUCOSE, CAPILLARY
Glucose-Capillary: 115 mg/dL — ABNORMAL HIGH (ref 70–99)
Glucose-Capillary: 90 mg/dL (ref 70–99)
Glucose-Capillary: 111 mg/dL — ABNORMAL HIGH (ref 70–99)
Glucose-Capillary: 118 mg/dL — ABNORMAL HIGH (ref 70–99)
Glucose-Capillary: 119 mg/dL — ABNORMAL HIGH (ref 70–99)
Glucose-Capillary: 111 mg/dL — ABNORMAL HIGH (ref 70–99)

## 2023-10-17 LAB — RAPID URINE DRUG SCREEN, HOSP PERFORMED
Amphetamines: NOT DETECTED
Barbiturates: NOT DETECTED
Benzodiazepines: NOT DETECTED
Cocaine: NOT DETECTED
Opiates: NOT DETECTED
Tetrahydrocannabinol: POSITIVE — AB

## 2023-10-17 LAB — BLOOD GAS, ARTERIAL
Acid-Base Excess: 5.2 mmol/L — ABNORMAL HIGH (ref 0.0–2.0)
Bicarbonate: 33.7 mmol/L — ABNORMAL HIGH (ref 20.0–28.0)
Drawn by: 38235
FIO2: 50 %
O2 Saturation: 98.7 %
Patient temperature: 37
pCO2 arterial: 67 mm[Hg] (ref 32–48)
pH, Arterial: 7.31 — ABNORMAL LOW (ref 7.35–7.45)
pO2, Arterial: 128 mm[Hg] — ABNORMAL HIGH (ref 83–108)

## 2023-10-17 LAB — CBC
HCT: 45 % (ref 36.0–46.0)
Hemoglobin: 14 g/dL (ref 12.0–15.0)
MCH: 27.5 pg (ref 26.0–34.0)
MCHC: 31.1 g/dL (ref 30.0–36.0)
MCV: 88.2 fL (ref 80.0–100.0)
Platelets: 167 10*3/uL (ref 150–400)
RBC: 5.1 MIL/uL (ref 3.87–5.11)
RDW: 14.7 % (ref 11.5–15.5)
WBC: 9.9 10*3/uL (ref 4.0–10.5)
nRBC: 0 % (ref 0.0–0.2)

## 2023-10-17 LAB — MRSA NEXT GEN BY PCR, NASAL: MRSA by PCR Next Gen: NOT DETECTED

## 2023-10-17 MED ORDER — NICARDIPINE HCL IN NACL 20-0.86 MG/200ML-% IV SOLN
INTRAVENOUS | Status: AC
  Filled 2023-10-17: qty 200

## 2023-10-17 MED ORDER — FENTANYL 2500MCG IN NS 250ML (10MCG/ML) PREMIX INFUSION
INTRAVENOUS | Status: AC
  Filled 2023-10-17: qty 250

## 2023-10-17 MED ORDER — FAMOTIDINE 20 MG PO TABS
20.0000 mg | ORAL_TABLET | Freq: Two times a day (BID) | ORAL | Status: DC
  Administered 2023-10-17 – 2023-10-18 (×3): 20 mg
  Filled 2023-10-17 (×3): qty 1

## 2023-10-17 MED ORDER — SIMVASTATIN 20 MG PO TABS: 40.0000 mg | ORAL_TABLET | Freq: Every day | ORAL | Status: DC

## 2023-10-17 MED ORDER — CLONAZEPAM 0.5 MG PO TABS
0.5000 mg | ORAL_TABLET | Freq: Three times a day (TID) | ORAL | Status: AC | PRN
  Administered 2023-10-17 – 2023-10-18 (×2): 0.5 mg via ORAL
  Filled 2023-10-17 (×2): qty 1

## 2023-10-17 MED ORDER — DEXMEDETOMIDINE HCL IN NACL 400 MCG/100ML IV SOLN
INTRAVENOUS | Status: AC
  Filled 2023-10-17: qty 100

## 2023-10-17 MED ORDER — CHLORHEXIDINE GLUCONATE CLOTH 2 % EX PADS
6.0000 | MEDICATED_PAD | CUTANEOUS | Status: DC
  Administered 2023-10-17 – 2023-10-18 (×2): 6 via TOPICAL

## 2023-10-17 MED ORDER — INSULIN ASPART 100 UNIT/ML IJ SOLN: 0.0000 [IU] | INTRAMUSCULAR | Status: DC

## 2023-10-17 MED ORDER — ALBUTEROL SULFATE (2.5 MG/3ML) 0.083% IN NEBU: 2.5000 mg | INHALATION_SOLUTION | RESPIRATORY_TRACT | Status: DC | PRN

## 2023-10-17 MED ORDER — NICOTINE 14 MG/24HR TD PT24
14.0000 mg | MEDICATED_PATCH | Freq: Every day | TRANSDERMAL | Status: DC
  Administered 2023-10-17 – 2023-10-26 (×10): 14 mg via TRANSDERMAL
  Filled 2023-10-17 (×10): qty 1

## 2023-10-17 MED ORDER — POTASSIUM CHLORIDE 20 MEQ PO PACK
40.0000 meq | PACK | Freq: Once | ORAL | Status: AC
  Administered 2023-10-17: 40 meq
  Filled 2023-10-17: qty 2

## 2023-10-17 MED ORDER — PROPOFOL 1000 MG/100ML IV EMUL
5.0000 ug/kg/min | INTRAVENOUS | Status: DC
  Administered 2023-10-17: 15 ug/kg/min via INTRAVENOUS
  Administered 2023-10-17: 10 ug/kg/min via INTRAVENOUS
  Filled 2023-10-17 (×2): qty 100

## 2023-10-17 MED ORDER — BUDESONIDE 0.5 MG/2ML IN SUSP
0.5000 mg | Freq: Two times a day (BID) | RESPIRATORY_TRACT | Status: DC
  Administered 2023-10-17 – 2023-10-24 (×12): 0.5 mg via RESPIRATORY_TRACT
  Filled 2023-10-17 (×17): qty 2

## 2023-10-17 MED ORDER — ORAL CARE MOUTH RINSE: 15.0000 mL | OROMUCOSAL | Status: DC | PRN

## 2023-10-17 MED ORDER — PANTOPRAZOLE SODIUM 40 MG IV SOLR: 40.0000 mg | Freq: Every day | INTRAVENOUS | Status: DC

## 2023-10-17 MED ORDER — NICARDIPINE HCL IN NACL 20-0.86 MG/200ML-% IV SOLN
0.0000 mg/h | INTRAVENOUS | Status: DC
  Administered 2023-10-17: 5 mg/h via INTRAVENOUS
  Filled 2023-10-17: qty 200

## 2023-10-17 MED ORDER — STROKE: EARLY STAGES OF RECOVERY BOOK
Freq: Once | Status: AC
  Filled 2023-10-17: qty 1

## 2023-10-17 MED ORDER — FENTANYL 2500MCG IN NS 250ML (10MCG/ML) PREMIX INFUSION
INTRAVENOUS | Status: AC
  Administered 2023-10-17: 25 ug/h via INTRAVENOUS
  Filled 2023-10-17: qty 250

## 2023-10-17 MED ORDER — ORAL CARE MOUTH RINSE: 15.0000 mL | OROMUCOSAL | Status: DC

## 2023-10-17 MED ORDER — SENNOSIDES-DOCUSATE SODIUM 8.6-50 MG PO TABS
1.0000 | ORAL_TABLET | Freq: Two times a day (BID) | ORAL | Status: DC
  Administered 2023-10-17: 1 via ORAL
  Filled 2023-10-17: qty 1

## 2023-10-17 MED ORDER — ROSUVASTATIN CALCIUM 20 MG PO TABS
10.0000 mg | ORAL_TABLET | Freq: Every day | ORAL | Status: DC
Start: 2023-10-17 — End: 2023-10-18
  Administered 2023-10-17 – 2023-10-18 (×2): 10 mg
  Filled 2023-10-17 (×2): qty 1

## 2023-10-17 MED ORDER — GADOBUTROL 1 MMOL/ML IV SOLN
10.0000 mL | Freq: Once | INTRAVENOUS | Status: AC | PRN
  Administered 2023-10-17: 10 mL via INTRAVENOUS

## 2023-10-17 MED ORDER — ARFORMOTEROL TARTRATE 15 MCG/2ML IN NEBU
15.0000 ug | INHALATION_SOLUTION | Freq: Two times a day (BID) | RESPIRATORY_TRACT | Status: DC
  Administered 2023-10-17 – 2023-10-24 (×12): 15 ug via RESPIRATORY_TRACT
  Filled 2023-10-17 (×17): qty 2

## 2023-10-17 MED ORDER — FENTANYL 2500MCG IN NS 250ML (10MCG/ML) PREMIX INFUSION: 0.0000 ug/h | INTRAVENOUS | Status: DC

## 2023-10-17 MED ORDER — SUCCINYLCHOLINE CHLORIDE 200 MG/10ML IV SOSY
PREFILLED_SYRINGE | INTRAVENOUS | Status: AC
  Administered 2023-10-17: 100 mg
  Filled 2023-10-17: qty 10

## 2023-10-17 MED ORDER — CHLORHEXIDINE GLUCONATE CLOTH 2 % EX PADS: 6.0000 | MEDICATED_PAD | Freq: Every day | CUTANEOUS | Status: DC

## 2023-10-17 MED ORDER — ORAL CARE MOUTH RINSE
15.0000 mL | OROMUCOSAL | Status: DC
  Administered 2023-10-17 (×2): 15 mL via OROMUCOSAL

## 2023-10-17 MED ORDER — REVEFENACIN 175 MCG/3ML IN SOLN
175.0000 ug | Freq: Every day | RESPIRATORY_TRACT | Status: DC
Start: 2023-10-17 — End: 2023-10-26
  Administered 2023-10-17 – 2023-10-24 (×8): 175 ug via RESPIRATORY_TRACT
  Filled 2023-10-17 (×10): qty 3

## 2023-10-17 MED ORDER — ACETAMINOPHEN 325 MG PO TABS
650.0000 mg | ORAL_TABLET | ORAL | Status: DC | PRN
Start: 2023-10-17 — End: 2023-10-26
  Administered 2023-10-18 – 2023-10-24 (×5): 650 mg via ORAL
  Filled 2023-10-17 (×5): qty 2

## 2023-10-17 MED ORDER — PROPOFOL 1000 MG/100ML IV EMUL
INTRAVENOUS | Status: AC
  Administered 2023-10-17: 5 ug/kg/min via INTRAVENOUS
  Filled 2023-10-17: qty 100

## 2023-10-17 MED ORDER — PHENYLEPHRINE 80 MCG/ML (10ML) SYRINGE FOR IV PUSH (FOR BLOOD PRESSURE SUPPORT)
PREFILLED_SYRINGE | INTRAVENOUS | Status: AC
  Administered 2023-10-17: 80 ug
  Filled 2023-10-17: qty 10

## 2023-10-17 MED ORDER — ETOMIDATE 2 MG/ML IV SOLN
INTRAVENOUS | Status: AC
  Administered 2023-10-17: 20 mg
  Filled 2023-10-17: qty 10

## 2023-10-17 MED ORDER — SENNOSIDES-DOCUSATE SODIUM 8.6-50 MG PO TABS
1.0000 | ORAL_TABLET | Freq: Two times a day (BID) | ORAL | Status: DC
  Administered 2023-10-17: 1
  Filled 2023-10-17: qty 1

## 2023-10-17 MED ORDER — ACETAMINOPHEN 160 MG/5ML PO SOLN: 650.0000 mg | ORAL | Status: DC | PRN

## 2023-10-17 MED ORDER — ACETAMINOPHEN 650 MG RE SUPP: 650.0000 mg | RECTAL | Status: DC | PRN

## 2023-10-17 NOTE — Progress Notes (Signed)
Pt placed on PS/CPAP 12/5 on 40% and is tolerating well. RT will monitor.

## 2023-10-17 NOTE — Progress Notes (Addendum)
STROKE TEAM PROGRESS NOTE   BRIEF HPI Ms. Harkirat Dobias is a 67 y.o. female with history of GERD, hypertension, hyperlipidemia, COPD, marijuana use and smoking presenting with right-sided weakness and somnolence.  Patient was intubated prior to arrival at the hospital, and CT head revealed left basal ganglia ICH.  NIH on Admission 24   SIGNIFICANT HOSPITAL EVENTS 12/21-patient admitted with small left basal ganglia ICH  INTERIM HISTORY/SUBJECTIVE Patient has been afebrile.  She required nicardipine to maintain her blood pressure within parameters overnight but this has been discontinued.  She did have 1 brief episode of hypotension, but this has resolved.  She remains intubated but hopefully can be extubated later today.  MRI scan of the brain shows stable appearance of the left basal ganglia hemorrhagic infarct without significant cytotoxic edema, midline shift or intraventricular extension.  OBJECTIVE  CBC    Component Value Date/Time   WBC 9.9 10/17/2023 0513   RBC 5.10 10/17/2023 0513   HGB 14.3 10/17/2023 0536   HGB 15.7 (H) 08/15/2022 1320   HCT 42.0 10/17/2023 0536   HCT 41.2 11/12/2015 0559   PLT 167 10/17/2023 0513   PLT 184 08/15/2022 1320   MCV 88.2 10/17/2023 0513   MCH 27.5 10/17/2023 0513   MCHC 31.1 10/17/2023 0513   RDW 14.7 10/17/2023 0513   LYMPHSABS 1.2 10/16/2023 2145   MONOABS 0.5 10/16/2023 2145   EOSABS 0.1 10/16/2023 2145   BASOSABS 0.0 10/16/2023 2145    BMET    Component Value Date/Time   NA 139 10/17/2023 0536   K 3.4 (L) 10/17/2023 0536   CL 103 10/17/2023 0513   CO2 31 10/17/2023 0513   GLUCOSE 103 (H) 10/17/2023 0513   BUN 7 (L) 10/17/2023 0513   CREATININE 1.02 (H) 10/17/2023 0513   CREATININE 1.03 (H) 08/15/2022 1320   CALCIUM 8.5 (L) 10/17/2023 0513   GFRNONAA >60 10/17/2023 0513   GFRNONAA >60 08/15/2022 1320    IMAGING past 24 hours MR BRAIN W WO CONTRAST Result Date: 10/17/2023 CLINICAL DATA:  Stroke follow-up EXAM: MRI HEAD  WITHOUT AND WITH CONTRAST MRV HEAD WITHOUT CONTRAST TECHNIQUE: Multiplanar, multiecho pulse sequences of the brain and surrounding structures were obtained without and with intravenous contrast. Angiographic images of the intracranial venous structures were obtained using MRV technique without intravenous contrast. CONTRAST:  10mL GADAVIST GADOBUTROL 1 MMOL/ML IV SOLN COMPARISON:  Head CT from earlier today FINDINGS: MRI head: Known acute hemorrhage in the left posterior limb of the internal capsule and adjacent gray matter structures with early methemoglobin seen as T1 shortening on sagittal images. There is a rim of edema which is expected. No underlying infarct or vascular lesion is detected. Patchy chronic right posterior frontal and parietal cortically based infarcts. Ischemic gliosis in the cerebral white matter with chronic lacunar infarcts in the deep watershed region. FLAIR hyperintensity in the sulcal spaces bilaterally, likely related to oxygenation in this intubated patient. No leptomeningeal enhancement or intraventricular debris. Unchanged chronic bilateral ICA occlusion with altered flow voids. There has been recent CTA. No acute finding in the sinuses or orbits. MRV: Some venous opacification preceding CTA. The left transverse and sigmoid sinus is strongly dominant. Most of right transverse and sigmoid flow is from the vein of Labbe. No stricture or occlusion is seen. Symmetric flow in the deep veins. IMPRESSION: Brain MRI: 1. No mass or infarct seen underlying the patient's ICH. 2. Chronic small vessel disease and remote right frontal parietal cortex infarcts. 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 MRV:  Negative.  No explanation for the hemorrhage. Electronically Signed   By: Tiburcio Pea M.D.   On: 10/17/2023 11:14   MR Venogram Head Result Date: 10/17/2023 CLINICAL DATA:  Stroke follow-up EXAM: MRI HEAD WITHOUT AND WITH CONTRAST MRV HEAD WITHOUT CONTRAST TECHNIQUE: Multiplanar, multiecho  pulse sequences of the brain and surrounding structures were obtained without and with intravenous contrast. Angiographic images of the intracranial venous structures were obtained using MRV technique without intravenous contrast. CONTRAST:  10mL GADAVIST GADOBUTROL 1 MMOL/ML IV SOLN COMPARISON:  Head CT from earlier today FINDINGS: MRI head: Known acute hemorrhage in the left posterior limb of the internal capsule and adjacent gray matter structures with early methemoglobin seen as T1 shortening on sagittal images. There is a rim of edema which is expected. No underlying infarct or vascular lesion is detected. Patchy chronic right posterior frontal and parietal cortically based infarcts. Ischemic gliosis in the cerebral white matter with chronic lacunar infarcts in the deep watershed region. FLAIR hyperintensity in the sulcal spaces bilaterally, likely related to oxygenation in this intubated patient. No leptomeningeal enhancement or intraventricular debris. Unchanged chronic bilateral ICA occlusion with altered flow voids. There has been recent CTA. No acute finding in the sinuses or orbits. MRV: Some venous opacification preceding CTA. The left transverse and sigmoid sinus is strongly dominant. Most of right transverse and sigmoid flow is from the vein of Labbe. No stricture or occlusion is seen. Symmetric flow in the deep veins. IMPRESSION: Brain MRI: 1. No mass or infarct seen underlying the patient's ICH. 2. Chronic small vessel disease and remote right frontal parietal cortex infarcts. 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 MRV: Negative.  No explanation for the hemorrhage. Electronically Signed   By: Tiburcio Pea M.D.   On: 10/17/2023 11:14   CT Head Wo Contrast Result Date: 10/17/2023 CLINICAL DATA:  Follow-up bleed. EXAM: CT HEAD WITHOUT CONTRAST TECHNIQUE: Contiguous axial images were obtained from the base of the skull through the vertex without intravenous contrast. RADIATION DOSE REDUCTION:  This exam was performed according to the departmental dose-optimization program which includes automated exposure control, adjustment of the mA and/or kV according to patient size and/or use of iterative reconstruction technique. COMPARISON:  10/16/2023 FINDINGS: Brain: Intraparenchymal hematoma again noted in the left basal ganglia measuring 2.3 x 2.3 x 2.2 cm compared to 2.3 x 2.3 x 1.8 cm previously. Small amount of surrounding edema. No significant mass effect/midline shift. Old right frontal infarct. No new hemorrhage or hydrocephalus. Vascular: No hyperdense vessel or unexpected calcification. Skull: No acute calvarial abnormality. Sinuses/Orbits: No acute findings Other: None IMPRESSION: Left basal ganglia intracerebral hematoma measuring 2.3 x 2.3 x 2.2 cm. No mass effect or midline shift. Electronically Signed   By: Charlett Nose M.D.   On: 10/17/2023 01:46   DG Chest Portable 1 View Result Date: 10/17/2023 CLINICAL DATA:  Status post intubation. EXAM: PORTABLE CHEST 1 VIEW COMPARISON:  September 30, 2022 FINDINGS: An endotracheal tube is seen. Its distal tip is approximately 4.0 cm proximal to the carina which is limited in visualization. An enteric tube is in place with its distal end extending into the body of the stomach. The heart size and mediastinal contours are within normal limits. Low lung volumes are noted without evidence of an acute infiltrate, pleural effusion or pneumothorax. Multiple radiopaque surgical clips are seen along the lateral aspects of the chest wall. The visualized skeletal structures are unremarkable. IMPRESSION: 1. Endotracheal tube and enteric tube positioning, as described above. 2. Low lung  volumes without evidence of acute or active cardiopulmonary disease. Electronically Signed   By: Aram Candela M.D.   On: 10/17/2023 01:10   CT ANGIO HEAD NECK W WO CM Result Date: 10/16/2023 CLINICAL DATA:  Right-sided weakness EXAM: CT ANGIOGRAPHY HEAD AND NECK WITH AND WITHOUT  CONTRAST TECHNIQUE: Multidetector CT imaging of the head and neck was performed using the standard protocol during bolus administration of intravenous contrast. Multiplanar CT image reconstructions and MIPs were obtained to evaluate the vascular anatomy. Carotid stenosis measurements (when applicable) are obtained utilizing NASCET criteria, using the distal internal carotid diameter as the denominator. RADIATION DOSE REDUCTION: This exam was performed according to the departmental dose-optimization program which includes automated exposure control, adjustment of the mA and/or kV according to patient size and/or use of iterative reconstruction technique. CONTRAST:  75mL OMNIPAQUE IOHEXOL 350 MG/ML SOLN COMPARISON:  09/30/2022 head CT 08/19/2018 MRA head neck 02/27/2015 CTA head neck FINDINGS: CT HEAD FINDINGS Brain: Intraparenchymal hematoma within the left basal ganglia that measures 2.3 x 1.8 x 2.3 cm. Old right frontal infarct. No midline shift or other mass effect. Vascular: No hyperdense vessel or unexpected vascular calcification. Skull: The visualized skull base, calvarium and extracranial soft tissues are normal. Sinuses/Orbits: No fluid levels or advanced mucosal thickening of the visualized paranasal sinuses. No mastoid or middle ear effusion. Normal orbits. Critical Value/emergent results were called by telephone at the time of interpretation on 10/16/2023 at 11:06 pm to provider [facility] , who verbally acknowledged these results. CTA NECK FINDINGS Skeleton: No acute abnormality or high grade bony spinal canal stenosis. Other neck: Normal pharynx, larynx and major salivary glands. No cervical lymphadenopathy. Unremarkable thyroid gland. Upper chest: No pneumothorax or pleural effusion. No nodules or masses. Aortic arch: There is calcific atherosclerosis of the aortic arch. Conventional 3 vessel aortic branching pattern. RIGHT carotid system: There is mild atherosclerosis of the right common carotid  artery. The right internal carotid artery is occluded, which is chronic. LEFT carotid system: There is multifocal mixed density atherosclerosis of the left common carotid artery. Low density plaque extends into the proximal ICA causing proximal occlusion with weak, unchanged distal reconstitution. The ICA remains diminutive to the skull base. This is also unchanged. Vertebral arteries: Left dominant configuration. There are patent bilateral vertebral artery origins stents. CTA HEAD FINDINGS POSTERIOR CIRCULATION: Mild atherosclerosis of the distal right V4 segment. Normal left. No proximal occlusion of the anterior or inferior cerebellar arteries. Basilar artery is normal. Superior cerebellar arteries are normal. Posterior cerebral arteries are normal. ANTERIOR CIRCULATION: The right internal carotid artery is occluded with reconstitution at the ophthalmic segment, unchanged. The left ICA is severely stenotic within the proximal skull base segments with a returns to more normal caliber at the ophthalmic segment. Anterior cerebral arteries are normal. Middle cerebral arteries are normal. Venous sinuses: As permitted by contrast timing, patent. Anatomic variants: None Review of the MIP images confirms the above findings. IMPRESSION: 1. Intraparenchymal hematoma within the left basal ganglia that measures 2.3 x 1.8 x 2.3 cm. No midline shift or other mass effect. 2. 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 3. 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 4. Patent bilateral vertebral artery origin stents. Aortic Atherosclerosis (ICD10-I70.0). Critical Value/emergent results were called by telephone at the time of interpretation on 10/16/2023 at 11:06 pm to provider [facility] , who verbally acknowledged these results. Electronically Signed   By: Caryn Bee  Chase Picket M.D.   On: 10/16/2023 23:22   CT  C-SPINE NO CHARGE Result Date: 10/16/2023 CLINICAL DATA:  Syncope, fall.  Neck trauma EXAM: CT CERVICAL SPINE WITHOUT CONTRAST TECHNIQUE: Multidetector CT imaging of the cervical spine was performed without intravenous contrast. Multiplanar CT image reconstructions were also generated. RADIATION DOSE REDUCTION: This exam was performed according to the departmental dose-optimization program which includes automated exposure control, adjustment of the mA and/or kV according to patient size and/or use of iterative reconstruction technique. COMPARISON:  None Available. FINDINGS: Alignment: Normal Skull base and vertebrae: No acute fracture. No primary bone lesion or focal pathologic process. Soft tissues and spinal canal: No prevertebral fluid or swelling. No visible canal hematoma. Disc levels:  Maintained.  No disc herniation. Upper chest: Negative Other: None IMPRESSION: No acute bony abnormality. Electronically Signed   By: Charlett Nose M.D.   On: 10/16/2023 23:06    Vitals:   10/17/23 1030 10/17/23 1045 10/17/23 1056 10/17/23 1117  BP:      Pulse: 79 70    Resp: (!) 22 (!) 22    Temp:    98.6 F (37 C)  TempSrc:    Oral  SpO2: 96% 98% 95%   Weight:      Height:         PHYSICAL EXAM General: Well-nourished, well-developed elderly lady, intubated in no acute distress CV: Regular rate and rhythm on monitor Respiratory: Respirations synchronous with ventilator  NEURO (sedation with fentanyl and propofol paused): Eyes are closed but opens partially to sternal rub Pupils equal round and reactive to light, does not focus or track examiner, withdraws all extremities to noxious stimulation and moves all extremities to sternal rub, some shivering noted.  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 HgbA1c No results found for requested labs within last 1095 days. VTE prophylaxis -SCDs clopidogrel 75 mg daily prior to admission, now on No antithrombotic secondary to ICH Therapy recommendations:  Pending Disposition: Pending  Hypertension Home meds: None Stable off nicardipine Blood Pressure Goal: SBP between 130-150 for 24 hours and then less than 160   Hyperlipidemia Home meds: Simvastatin 40 mg daily LDL No results found for requested labs within last 1095 days., goal < 70 Continue statin at discharge  Risk for diabetes type II  Home meds: None HgbA1c No results found for requested labs within last 1095 days., goal < 7.0  Tobacco Abuse Patient smokes 1 packs per day for 40 years Will discuss tobacco cessation and nicotine replacement when patient is extubated  Substance Abuse Patient uses marijuana UDS positive for  [facility]  Will discuss cessation when patient extubated  Dysphagia Patient has post-stroke dysphagia, SLP consulted    Diet   Diet NPO time specified   Advance diet as tolerated  Respiratory failure Patient intubated prior to arrival due to somnolence Ventilator management per CCM Extubate when able  Other Stroke Risk Factors Obesity, Body mass index is  40.26 kg/m., BMI >/= 30 associated with increased stroke risk, recommend weight loss, diet and exercise as appropriate    Other Active Problems None  Hospital day # 1  Patient seen by NP with MD, MD to edit note is needed. Cortney E Ernestina Columbia , MSN, AGACNP-BC [facility] See Amion for schedule and pager information 10/17/2023 11:38 AM   STROKE MD NOTE :  I have personally obtained history,examined this patient, reviewed notes, independently viewed imaging studies, participated in medical decision making and plan of care.ROS completed by me personally and pertinent positives fully documented  I have made any additions or clarifications directly to the above note. Agree with note above.  She presented with sudden onset of right hemiparesis due to left basal ganglia hemorrhage likely of hypertensive etiology versus hemorrhagic infarct.  Patient was intubated for unclear reasons with chest x-ray and brain MRI did not show any reason why she should not be able to protect her airway.  Recommend wean off sedation and extubate as tolerated.  Continue strict blood pressure control with systolic goal between 130-150 for the first 24 hours and then below 160.  Full neurological monitoring.  Long discussion with patient and her daughter-in-law at the bedside and answered questions.  Discussed with Dr. Tonia Brooms critical care medicine. This patient is critically ill and at significant risk of neurological worsening, death and care requires constant monitoring of vital signs, hemodynamics,respiratory and cardiac monitoring, extensive review of multiple databases, frequent neurological assessment, discussion with family, other specialists and medical decision making of high complexity.I have made any additions or clarifications directly to the above note.This critical care time does not reflect procedure time, or teaching time or supervisory time of PA/NP/Med Resident etc but could involve care discussion time.  I spent 30 minutes of neurocritical care time  in the care of  this patient.      Delia Heady, MD Medical Director [facility] Pager: [phone] 10/17/2023 2:40 PM   To contact Stroke Continuity provider, please refer to [email or web address]. After  hours, contact General Neurology

## 2023-10-17 NOTE — ED Provider Notes (Signed)
[facility] AT [facility] Provider Note   CSN: [id number] Arrival date & time: 10/16/23  2154     History  Chief Complaint  Patient presents with   April [name]    April [name] is a 67 y.o. female with a history of CVA, COPD, heart failure and breast cancer who presents to the ED for altered mental status.  Patient herself reports she was in her normal state of health until approximately 1600 today when she began to have weakness in her right arm and right leg and "blacked out".  She called out for help and was found down on the ground by a neighbor.  She denies any head strike and is uncertain how long she was down for but thinks it was about an hour.  Patient reports some left-sided weakness in her hand after prior stroke but states the right upper extremity weakness is new.  Denies chest pain, shortness of breath, nausea, vomiting, fevers, chills and recent illness.  She takes Plavix daily.  No other anticoagulation.   Fall       Home Medications Prior to Admission medications   Medication Sig Start Date End Date Taking? Authorizing Provider  acetaminophen (TYLENOL) 500 MG tablet Take 1,000 mg by mouth every 8 (eight) hours as needed for moderate pain.    [provider]  albuterol (VENTOLIN HFA) 108 (90 Base) MCG/ACT inhaler Inhale 2 puffs into the lungs every 6 (six) hours as needed for wheezing or shortness of breath.    [provider]  clonazePAM (KLONOPIN) 0.5 MG tablet Take 0.5 mg by mouth in the morning, at noon, and at bedtime. 06/06/15   [provider]  clopidogrel (PLAVIX) 75 MG tablet Take 75 mg by mouth daily.    [provider]  fluticasone (FLONASE) 50 MCG/ACT nasal spray Place 2 sprays into both nostrils daily as needed for allergies or rhinitis.    [provider]  fluticasone-salmeterol (ADVAIR) 100-50 MCG/ACT AEPB Inhale one dose twice a day for lung disease 08/28/22   [provider]   Fluticasone-Umeclidin-Vilant (TRELEGY ELLIPTA) 100-62.5-25 MCG/ACT AEPB TAKE 1 PUFF BY MOUTH EVERY DAY 06/12/23   Oretha Milch, MD  furosemide (LASIX) 20 MG tablet Take 20 mg by mouth daily as needed for edema. 11/05/22   [provider]  HYDROcodone-acetaminophen (NORCO/VICODIN) 5-325 MG tablet Take 1 tablet by mouth 2 (two) times daily as needed for moderate pain.    [provider]  HYDROcodone-acetaminophen (NORCO/VICODIN) 5-325 MG tablet Take 1 tablet by mouth every 4 (four) hours as needed for moderate pain. 12/01/22 12/01/23  Chevis Pretty III, MD  letrozole [facility]) 2.5 MG tablet Take 1 tablet (2.5 mg total) by mouth daily. 09/25/22   Serena Croissant, MD  montelukast (SINGULAIR) 10 MG tablet Take 10 mg by mouth daily as needed (allergies). 04/26/22   [provider]  pantoprazole (PROTONIX) 40 MG tablet Take 40 mg by mouth 2 (two) times daily. 02/01/15   [provider]  promethazine (PHENERGAN) 25 MG tablet Take 25 mg by mouth every 8 (eight) hours as needed for nausea or vomiting.    [provider]  simvastatin (ZOCOR) 40 MG tablet Take 40 mg by mouth daily.    [provider]  sodium chloride (OCEAN) 0.65 % SOLN nasal spray Place 2 sprays into both nostrils 3 (three) times daily as needed for congestion.     [provider]  traZODone (DESYREL) 150 MG tablet Take 300 mg by  mouth at bedtime.    [provider]  varenicline (CHANTIX) 0.5 MG tablet TAKE 1 TABLET BY MOUTH TWICE A DAY 12/11/22   Serena Croissant, MD  venlafaxine XR (EFFEXOR-XR) 150 MG 24 hr capsule Take 150 mg by mouth daily with breakfast.    [provider]      Allergies    Patient has no known allergies.    Review of Systems   Review of Systems  Physical Exam Updated Vital Signs BP (!) 118/57   Pulse 77   Temp 98.5 F (36.9 C) (Oral)   Resp (!) 22   Ht 5\' 3"  (1.6 m)   Wt 107.6 kg   SpO2 98%   BMI 42.02 kg/m  Physical Exam Vitals and nursing  note reviewed.  HENT:     Head: Normocephalic and atraumatic.  Eyes:     Pupils: Pupils are equal, round, and reactive to light.  Cardiovascular:     Rate and Rhythm: Normal rate and regular rhythm.  Pulmonary:     Effort: Pulmonary effort is normal.     Breath sounds: Normal breath sounds.  Abdominal:     Palpations: Abdomen is soft.     Tenderness: There is no abdominal tenderness.  Musculoskeletal:     Cervical back: Neck supple. No rigidity.  Skin:    General: Skin is warm and dry.  Neurological:     Mental Status: She is alert.     Comments: Right upper extremity right lower extremity weakness Clear fluent speech No visual field deficit Oriented x 4  Psychiatric:        Mood and Affect: Mood normal.     ED Results / Procedures / Treatments   Labs (all labs ordered are listed, but only abnormal results are displayed) Labs Reviewed  APTT - Abnormal; Notable for the following components:      Result Value   aPTT 23 (*)    All other components within normal limits  CBC - Abnormal; Notable for the following components:   RBC 5.49 (*)    Hemoglobin 15.3 (*)    HCT 49.7 (*)    All other components within normal limits  DIFFERENTIAL - Abnormal; Notable for the following components:   Neutro Abs 7.8 (*)    All other components within normal limits  COMPREHENSIVE METABOLIC PANEL - Abnormal; Notable for the following components:   Chloride 96 (*)    CO2 33 (*)    Glucose, Bld 110 (*)    Albumin 3.4 (*)    AST 13 (*)    Alkaline Phosphatase 35 (*)    All other components within normal limits  BLOOD GAS, ARTERIAL - Abnormal; Notable for the following components:   pH, Arterial 7.31 (*)    pCO2 arterial 67 (*)    pO2, Arterial 128 (*)    Bicarbonate 33.7 (*)    Acid-Base Excess 5.2 (*)    All other components within normal limits  ETHANOL  PROTIME-INR  RAPID URINE DRUG SCREEN, HOSP PERFORMED  URINALYSIS, ROUTINE W REFLEX MICROSCOPIC  I-STAT CHEM 8, ED     EKG None  Radiology CT Head Wo Contrast Result Date: 10/17/2023 CLINICAL DATA:  Follow-up bleed. EXAM: CT HEAD WITHOUT CONTRAST TECHNIQUE: Contiguous axial images were obtained from the base of the skull through the vertex without intravenous contrast. RADIATION DOSE REDUCTION: This exam was performed according to the departmental dose-optimization program which includes automated exposure control, adjustment of the mA and/or kV according to patient size  and/or use of iterative reconstruction technique. COMPARISON:  10/16/2023 FINDINGS: Brain: Intraparenchymal hematoma again noted in the left basal ganglia measuring 2.3 x 2.3 x 2.2 cm compared to 2.3 x 2.3 x 1.8 cm previously. Small amount of surrounding edema. No significant mass effect/midline shift. Old right frontal infarct. No new hemorrhage or hydrocephalus. Vascular: No hyperdense vessel or unexpected calcification. Skull: No acute calvarial abnormality. Sinuses/Orbits: No acute findings Other: None IMPRESSION: Left basal ganglia intracerebral hematoma measuring 2.3 x 2.3 x 2.2 cm. No mass effect or midline shift. Electronically Signed   By: Charlett Nose M.D.   On: 10/17/2023 01:46   DG Chest Portable 1 View Result Date: 10/17/2023 CLINICAL DATA:  Status post intubation. EXAM: PORTABLE CHEST 1 VIEW COMPARISON:  September 30, 2022 FINDINGS: An endotracheal tube is seen. Its distal tip is approximately 4.0 cm proximal to the carina which is limited in visualization. An enteric tube is in place with its distal end extending into the body of the stomach. The heart size and mediastinal contours are within normal limits. Low lung volumes are noted without evidence of an acute infiltrate, pleural effusion or pneumothorax. Multiple radiopaque surgical clips are seen along the lateral aspects of the chest wall. The visualized skeletal structures are unremarkable. IMPRESSION: 1. Endotracheal tube and enteric tube positioning, as described above. 2. Low  lung volumes without evidence of acute or active cardiopulmonary disease. Electronically Signed   By: Aram Candela M.D.   On: 10/17/2023 01:10   CT ANGIO HEAD NECK W WO CM Result Date: 10/16/2023 CLINICAL DATA:  Right-sided weakness EXAM: CT ANGIOGRAPHY HEAD AND NECK WITH AND WITHOUT CONTRAST TECHNIQUE: Multidetector CT imaging of the head and neck was performed using the standard protocol during bolus administration of intravenous contrast. Multiplanar CT image reconstructions and MIPs were obtained to evaluate the vascular anatomy. Carotid stenosis measurements (when applicable) are obtained utilizing NASCET criteria, using the distal internal carotid diameter as the denominator. RADIATION DOSE REDUCTION: This exam was performed according to the departmental dose-optimization program which includes automated exposure control, adjustment of the mA and/or kV according to patient size and/or use of iterative reconstruction technique. CONTRAST:  75mL OMNIPAQUE IOHEXOL 350 MG/ML SOLN COMPARISON:  09/30/2022 head CT 08/19/2018 MRA head neck 02/27/2015 CTA head neck FINDINGS: CT HEAD FINDINGS Brain: Intraparenchymal hematoma within the left basal ganglia that measures 2.3 x 1.8 x 2.3 cm. Old right frontal infarct. No midline shift or other mass effect. Vascular: No hyperdense vessel or unexpected vascular calcification. Skull: The visualized skull base, calvarium and extracranial soft tissues are normal. Sinuses/Orbits: No fluid levels or advanced mucosal thickening of the visualized paranasal sinuses. No mastoid or middle ear effusion. Normal orbits. Critical Value/emergent results were called by telephone at the time of interpretation on 10/16/2023 at 11:06 pm to provider [facility] , who verbally acknowledged these results. CTA NECK FINDINGS Skeleton: No acute abnormality or high grade bony spinal canal stenosis. Other neck: Normal pharynx, larynx and major salivary glands. No cervical lymphadenopathy.  Unremarkable thyroid gland. Upper chest: No pneumothorax or pleural effusion. No nodules or masses. Aortic arch: There is calcific atherosclerosis of the aortic arch. Conventional 3 vessel aortic branching pattern. RIGHT carotid system: There is mild atherosclerosis of the right common carotid artery. The right internal carotid artery is occluded, which is chronic. LEFT carotid system: There is multifocal mixed density atherosclerosis of the left common carotid artery. Low density plaque extends into the proximal ICA causing proximal occlusion with weak, unchanged distal reconstitution. The  ICA remains diminutive to the skull base. This is also unchanged. Vertebral arteries: Left dominant configuration. There are patent bilateral vertebral artery origins stents. CTA HEAD FINDINGS POSTERIOR CIRCULATION: Mild atherosclerosis of the distal right V4 segment. Normal left. No proximal occlusion of the anterior or inferior cerebellar arteries. Basilar artery is normal. Superior cerebellar arteries are normal. Posterior cerebral arteries are normal. ANTERIOR CIRCULATION: The right internal carotid artery is occluded with reconstitution at the ophthalmic segment, unchanged. The left ICA is severely stenotic within the proximal skull base segments with a returns to more normal caliber at the ophthalmic segment. Anterior cerebral arteries are normal. Middle cerebral arteries are normal. Venous sinuses: As permitted by contrast timing, patent. Anatomic variants: None Review of the MIP images confirms the above findings. IMPRESSION: 1. Intraparenchymal hematoma within the left basal ganglia that measures 2.3 x 1.8 x 2.3 cm. No midline shift or other mass effect. 2. 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 3. 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 4. Patent bilateral vertebral artery origin stents.  Aortic Atherosclerosis (ICD10-I70.0). Critical Value/emergent results were called by telephone at the time of interpretation on 10/16/2023 at 11:06 pm to provider [facility] , who verbally acknowledged these results. Electronically Signed   By: Deatra Robinson M.D.   On: 10/16/2023 23:22   CT C-SPINE NO CHARGE Result Date: 10/16/2023 CLINICAL DATA:  Syncope, fall.  Neck trauma EXAM: CT CERVICAL SPINE WITHOUT CONTRAST TECHNIQUE: Multidetector CT imaging of the cervical spine was performed without intravenous contrast. Multiplanar CT image reconstructions were also generated. RADIATION DOSE REDUCTION: This exam was performed according to the departmental dose-optimization program which includes automated exposure control, adjustment of the mA and/or kV according to patient size and/or use of iterative reconstruction technique. COMPARISON:  None Available. FINDINGS: Alignment: Normal Skull base and vertebrae: No acute fracture. No primary bone lesion or focal pathologic process. Soft tissues and spinal canal: No prevertebral fluid or swelling. No visible canal hematoma. Disc levels:  Maintained.  No disc herniation. Upper chest: Negative Other: None IMPRESSION: No acute bony abnormality. Electronically Signed   By: Charlett Nose M.D.   On: 10/16/2023 23:06    Procedures .Critical Care  Performed by: Royanne Foots, DO Authorized by: Royanne Foots, DO   Critical care provider statement:    Critical care time (minutes):  90   Critical care time was exclusive of:  Separately billable procedures and treating other patients and teaching time   Critical care was necessary to treat or prevent imminent or life-threatening deterioration of the following conditions:  CNS failure or compromise   Critical care was time spent personally by me on the following activities:  Development of treatment plan with patient or surrogate, discussions with consultants, evaluation of patient's response to treatment,  examination of patient, ordering and review of laboratory studies, ordering and review of radiographic studies, ordering and performing treatments and interventions, pulse oximetry, re-evaluation of patient's condition and review of old charts   I assumed direction of critical care for this patient from another provider in my specialty: no     Care discussed with: admitting provider   Comments:     Drs. Khaliqdina & Plancher Procedure Name: Intubation Date/Time: 10/17/2023 3:04 AM  Performed by: Royanne Foots, DOPre-anesthesia Checklist: Patient identified, Patient being monitored, Emergency Drugs available, Timeout performed and Suction available Oxygen Delivery Method: Non-rebreather mask Preoxygenation: Pre-oxygenation with 100% oxygen  Induction Type: Rapid sequence Ventilation: Mask ventilation without difficulty Laryngoscope Size: Glidescope and 4 Grade View: Grade I Tube size: 7.5 mm Number of attempts: 1 Placement Confirmation: ETT inserted through vocal cords under direct vision, CO2 detector and Breath sounds checked- equal and bilateral Secured at: 25 cm Tube secured with: ETT holder Comments: RSI with etomidate and succinylcholine    ARTERIAL LINE  Date/Time: 10/17/2023 3:06 AM  Performed by: Royanne Foots, DO Authorized by: Royanne Foots, DO   Consent:    Consent obtained:  Emergent situation Universal protocol:    Immediately prior to procedure, a time out was called: yes   Indications:    Indications: hemodynamic monitoring   Pre-procedure details:    Skin preparation:  Chlorhexidine Sedation:    Sedation type:  Deep Procedure details:    Location:  R radial   Needle gauge:  18 G   Placement technique:  Ultrasound guided   Number of attempts:  1   Transducer: waveform confirmed   Post-procedure details:    Post-procedure:  Biopatch applied, secured with tape, sterile dressing applied and wrist guard applied   CMS:  Normal   Procedure completion:   Tolerated     Medications Ordered in ED Medications  dexmedetomidine (PRECEDEX) 400 MCG/100ML (4 mcg/mL) infusion (  Not Given 10/17/23 0150)  propofol (DIPRIVAN) 1000 MG/100ML infusion (20 mcg/kg/min  107.6 kg Intravenous Rate/Dose Change 10/17/23 0050)  nicardipine (CARDENE) 20mg  in 0.86% saline IV infusion (0.1 mg/ml) (0 mg/hr Intravenous Paused 10/17/23 0049)  niCARdipine in saline (CARDENE-IV) 20-0.86 MG/200ML-% infusion SOLN (has no administration in time range)  fentaNYL in NS (37mcg/ml) infusion-PREMIX (50 mcg/hr Intravenous Rate/Dose Change 10/17/23 0049)  iohexol (OMNIPAQUE) 350 MG/ML injection 75 mL (75 mLs Intravenous Contrast Given 10/16/23 2240)  succinylcholine (ANECTINE) 200 MG/10ML syringe (100 mg  Given 10/17/23 0023)  etomidate (AMIDATE) 2 MG/ML injection (20 mg  Given 10/17/23 0023)  phenylephrine 80 mcg/10 mL injection (80 mcg  Given 10/17/23 0055)    ED Course/ Medical Decision Making/ A&P Clinical Course as of 10/17/23 0307  Fri Oct 16, 2023  2257 CT head consistent with intraparenchymal hemorrhage left basal ganglia.  Discussed with radiology.  She also has a right internal carotid occlusion that appears old along with left carotid stenosis.  Page sent to neurology to discuss.  Patient remains normotensive here [MP]  2320 Discussed with teleneurology physician Dr.Plancher.  Will plan on transfer to [facility] keeping a close watch on patient's blood pressure with systolic goal 100-1 40.  At this time patient did become more somnolent here in the emergency department.  Given concern for progressive brain bleed the decision was made to intubate the patient to secure the airway.  I talked to her about this and she was in agreement with this plan. [MP]  Sat Oct 17, 2023  0030 Intubation completed with etomidate and succinylcholine.  Postintubation sedation and analgesia with propofol and fentanyl. [MP]  0145 Following intubation patient had erratic  blood pressures ranging from high to low on blood pressure cuff.  Placed a right radial arterial line for closer monitoring of hemodynamic status.  We did obtain a repeat CT head per the request of neurology team.  We had nicardipine drip started but patient has remained within the blood pressure goal with adequate sedation after making titration adjustments with the fentanyl and propofol. [MP]    Clinical Course User Index [MP] Royanne Foots, DO  Medical Decision Making 67 year old female with history as above presenting after syncopal episode and being found down by neighbor.  Approximately down for 1 hour.  New onset right upper extremity right lower extremity weakness.  Last known well time around 1600 today.  Afebrile and normotensive on arrival here.  Initial exam notable for persistent right-sided weakness.  Discussed with neurology.  She is outside of the window for code stroke activation but we will obtain CT head and CTA of the head and neck given concern for potential hemorrhagic versus ischemic stroke.  Will obtain laboratory workup and EKG as well continue to monitor.  Will discuss CT findings with neurology thereafter.  Other differential diagnosis include dysrhythmia, infectious processes such as UTI or pneumonia, metabolic derangement and anemia.  1A: Level of Consciousness - Requires repeated stimulation to arouse + 2 1B: Ask Month and Age - Both Questions Right + 0 1C: Blink Eyes & Squeeze Hands - Performs Both Tasks + 0 2: Test Horizontal Extraocular Movements - Normal + 0 3: Test Visual Fields - No Visual Loss + 0 4: Test Facial Palsy (Use Grimace if Obtunded) - Normal symmetry + 0 5A: Test Left Arm Motor Drift - No Drift for 10 Seconds + 0 5B: Test Right Arm Motor Drift - Some Effort Against Gravity + 2 6A: Test Left Leg Motor Drift - No Drift for 5 Seconds + 0 6B: Test Right Leg Motor Drift - Some Effort Against Gravity + 2 7: Test Limb  Ataxia (FNF/Heel-Shin) - No Ataxia + 0 8: Test Sensation - Mild-Moderate Loss: Less Sharp/More Dull + 1 9: Test Language/Aphasia - Normal; No aphasia + 0 10: Test Dysarthria - Normal + 0 11: Test Extinction/Inattention - No abnormality + 0 NIHSS Score: 7   Amount and/or Complexity of Data Reviewed Labs: ordered. Radiology: ordered.  Risk Prescription drug management. Decision regarding hospitalization.           Final Clinical Impression(s) / ED Diagnoses Final diagnoses:  Hemorrhagic stroke [facility])  History of chronic obstructive pulmonary disease  Fall in home, initial encounter    Rx / DC Orders ED Discharge Orders     None         Royanne Foots, DO 10/17/23 0308

## 2023-10-17 NOTE — ED Notes (Signed)
Patient transported to CT 

## 2023-10-17 NOTE — Progress Notes (Signed)
Pt transported on vent to MRI and back [id number] without complications.

## 2023-10-17 NOTE — Consult Note (Signed)
NAME:  April [name], MRN:  [id number], DOB:  Dec 01, 1955, LOS: 1 ADMISSION DATE:  10/16/2023, CONSULTATION DATE:  12/20 REFERRING MD:  Derry Lory, CHIEF COMPLAINT:  ventilator management    History of Present Illness:  48 yof found on bathroom floor w/ right arm bent behind her. Reported she passed out and was laying on floor called her neighbor for help and EMS was called. . Denied hitting head. Had bruising on right arm. Later in ER reported right arm & leg  weakness since about 4pm on 20th,   CT showed left BG subcortical hemorrhage Prior to transport to [facility] became more somnolent and was intubated for airway protection F/u CT head showed " Intraparenchymal hematoma again noted in the left basal ganglia measuring 2.3 x 2.3 x 2.2 cm compared to 2.3 x 2.3 x 1.8 cm previously". She was admitted by Neurology and critical care was asked to assist w/ vent management and critical care needs  Pertinent  Medical History  HTN, prior CVA, COPD, heart failure (HFpEF), bilateral breast cancer, s/p bilateral mastectomy, anxiety, arthritis,GERD, HL  Significant Hospital Events: Including procedures, antibiotic start and stop dates in addition to other pertinent events   12/20 presented to [facility] w/ right sided weakness s/p passing out. Found to have what was felt to be non-traumatic left BG hemorrhage. UDS + THC 12/21 mental status declined. Intubated prior to transport. CT head w/ min change. Did show sm amount of surrounding edema   Interim History / Subjective:   Objective   Blood pressure (!) 118/57, pulse 77, temperature 98.3 F (36.8 C), temperature source Oral, resp. rate (!) 22, height 5\' 3"  (1.6 m), weight 107.6 kg, SpO2 93%.    Vent Mode: PRVC FiO2 (%):  [45 %-60 %] 50 % Set Rate:  [22 bmp-24 bmp] 24 bmp Vt Set:  [420 mL] 420 mL PEEP:  [5 cmH20] 5 cmH20 Plateau Pressure:  [20 cmH20-29 cmH20] 20 cmH20  No intake or output data in the 24 hours ending 10/17/23 0409 Filed Weights    10/16/23 2344  Weight: 107.6 kg    Examination: General: obese 67 year old female sedated on propofol and fent HENT: orally intubated. PERRL  Lungs: clear dec bases PCXR personally reviewed. ETT good position. Low vol but no edema or infiltrate Cardiovascular: RRR Abdomen: soft  Extremities: warm trace LE edema strong pulses. Brisk CR Neuro: localized w/ LUE to pain. W/d to pain LL. Flicker of wd response on right side.  GU: cl yellow   Resolved Hospital Problem list     Assessment & Plan:  Left BG ICH w/ known h/o prior right sided stroke -etiology not clear HTN vs hypertensive transformation Plan Admitted to stroke team  Serial neuro checks F/u imaging at 6 hrs or PRN if status declines BP goal 130-150 w/ PRN labetolol and Cleviprex as needed F/u MRI when stable. Will also need MR venogram  Holding her plavix   Acute on chronic HC respiratory failure due to progressive loss of mental status superimposed on underlying severe O2 dep COPD  Baseline CO2 calculated to mid-50s Plan Cont full vent support, increased Ve aiming for PCO2 mid 50s Will repeat abg  VAP bundle  Scheduled BDs and ICS  PAD protocol RASS goal 0 to -1   H/o HTN, HFpEF, and HL Plan BP management as above Zocor VT  H/o GERD Plan PPI  H/o depression Plan Holding her effexor and trazodone.  Was also on scheduled clonazepam. Currently on propofol so will hold  that as well   H/o bilateral breast cancer Plan Femara on hold for now   Best Practice (right click and "Reselect all SmartList Selections" daily)   Diet/type: NPO DVT prophylaxis SCD Pressure ulcer(s): N/A GI prophylaxis: PPI Lines: N/A Foley:  Yes, and it is still needed Code Status:  DNR Last date of multidisciplinary goals of care discussion [per primary ]  Labs   CBC: Recent Labs  Lab 10/16/23 2145  WBC 9.7  NEUTROABS 7.8*  HGB 15.3*  HCT 49.7*  MCV 90.5  PLT 174    Basic Metabolic Panel: Recent Labs  Lab  10/16/23 2145  NA 138  K 3.7  CL 96*  CO2 33*  GLUCOSE 110*  BUN 10  CREATININE 0.90  CALCIUM 9.1   GFR: Estimated Creatinine Clearance: 73.3 mL/min (by C-G formula based on SCr of 0.9 mg/dL). Recent Labs  Lab 10/16/23 2145  WBC 9.7    Liver Function Tests: Recent Labs  Lab 10/16/23 2145  AST 13*  ALT 12  ALKPHOS 35*  BILITOT 0.5  PROT 6.8  ALBUMIN 3.4*   No results for input(s): "LIPASE", "AMYLASE" in the last 168 hours. No results for input(s): "AMMONIA" in the last 168 hours.  ABG    Component Value Date/Time   PHART 7.31 (L) 10/17/2023 0130   PCO2ART 67 (HH) 10/17/2023 0130   PO2ART 128 (H) 10/17/2023 0130   HCO3 33.7 (H) 10/17/2023 0130   TCO2 31 11/10/2015 2126   O2SAT 98.7 10/17/2023 0130     Coagulation Profile: Recent Labs  Lab 10/16/23 2145  INR 1.0    Cardiac Enzymes: No results for input(s): "CKTOTAL", "CKMB", "CKMBINDEX", "TROPONINI" in the last 168 hours.  HbA1C: Hgb A1c MFr Bld  Date/Time Value Ref Range Status  11/11/2015 04:18 AM 5.9 (H) 4.8 - 5.6 % Final    Comment:    (NOTE)         Pre-diabetes: 5.7 - 6.4         Diabetes: >6.4         Glycemic control for adults with diabetes: <7.0   12/05/2009 07:16 PM  4.6 - 6.1 % Final   5.6 (NOTE) The ADA recommends the following therapeutic goal for glycemic control related to Hgb A1c measurement: Goal of therapy: <6.5 Hgb A1c  Reference: American Diabetes Association: Clinical Practice Recommendations 2010, Diabetes Care, 2010, 33: (Suppl  1).    CBG: Recent Labs  Lab 10/17/23 0317  GLUCAP 115*    Review of Systems:   Not able   Past Medical History:  She,  has a past medical history of Anxiety, Arthritis, Cancer (HCC), COPD (chronic obstructive pulmonary disease) (HCC), Depression, GERD (gastroesophageal reflux disease), Headache(784.0), Hyperlipidemia, Hypertension, Incontinent of urine, Oxygen dependent, Pneumonia, Shortness of breath, and Stroke (HCC).   Surgical  History:   Past Surgical History:  Procedure Laterality Date   CAROTID STENT     Has known BICA occlusions since 2011; s/p left vertebral artery stent 12/26/09 & right VA stent 02/12/10   CHOLECYSTECTOMY     PORT-A-CATH REMOVAL N/A 12/01/2022   Procedure: REMOVAL PORT-A-CATH;  Surgeon: Griselda Miner, MD;  Location: [facility] OR;  Service: General;  Laterality: N/A;   PORTACATH PLACEMENT Right 08/19/2021   Procedure: INSERTION PORT-A-CATH WITH ULTRASOUND;  Surgeon: Griselda Miner, MD;  Location: [facility] OR;  Service: General;  Laterality: Right;   ROTATOR CUFF REPAIR     SENTINEL NODE BIOPSY Bilateral 08/19/2021   Procedure: BILATERAL SENTINEL NODE BIOPSY;  Surgeon: Griselda Miner, MD;  Location: [facility] OR;  Service: General;  Laterality: Bilateral;   TOTAL MASTECTOMY Bilateral 08/19/2021   Procedure: BILATERAL TOTAL MASTECTOMY;  Surgeon: Griselda Miner, MD;  Location: [facility] OR;  Service: General;  Laterality: Bilateral;   TUBAL LIGATION       Social History:   reports that she has been smoking cigarettes. She has a 40 pack-year smoking history. She has been exposed to tobacco smoke. She has never used smokeless tobacco. She reports current drug use. Drug: Marijuana. She reports that she does not drink alcohol.   Family History:  Her family history includes Bone cancer in her maternal aunt and maternal aunt; Breast cancer in her cousin and cousin; Heart attack in her mother; Migraines in her mother.   Allergies No Known Allergies   Home Medications  Prior to Admission medications   Medication Sig Start Date End Date Taking? Authorizing Provider  acetaminophen (TYLENOL) 500 MG tablet Take 1,000 mg by mouth every 8 (eight) hours as needed for moderate pain.    [provider]  albuterol (VENTOLIN HFA) 108 (90 Base) MCG/ACT inhaler Inhale 2 puffs into the lungs every 6 (six) hours as needed for wheezing or shortness of breath.    [provider]  clonazePAM (KLONOPIN) 0.5 MG tablet Take 0.5  mg by mouth in the morning, at noon, and at bedtime. 06/06/15   [provider]  clopidogrel (PLAVIX) 75 MG tablet Take 75 mg by mouth daily.    [provider]  fluticasone (FLONASE) 50 MCG/ACT nasal spray Place 2 sprays into both nostrils daily as needed for allergies or rhinitis.    [provider]  fluticasone-salmeterol (ADVAIR) 100-50 MCG/ACT AEPB Inhale one dose twice a day for lung disease 08/28/22   [provider]  Fluticasone-Umeclidin-Vilant (TRELEGY ELLIPTA) 100-62.5-25 MCG/ACT AEPB TAKE 1 PUFF BY MOUTH EVERY DAY 06/12/23   Oretha Milch, MD  furosemide (LASIX) 20 MG tablet Take 20 mg by mouth daily as needed for edema. 11/05/22   [provider]  HYDROcodone-acetaminophen (NORCO/VICODIN) 5-325 MG tablet Take 1 tablet by mouth 2 (two) times daily as needed for moderate pain.    [provider]  HYDROcodone-acetaminophen (NORCO/VICODIN) 5-325 MG tablet Take 1 tablet by mouth every 4 (four) hours as needed for moderate pain. 12/01/22 12/01/23  Chevis Pretty III, MD  letrozole [facility]) 2.5 MG tablet Take 1 tablet (2.5 mg total) by mouth daily. 09/25/22   Serena Croissant, MD  montelukast (SINGULAIR) 10 MG tablet Take 10 mg by mouth daily as needed (allergies). 04/26/22   [provider]  pantoprazole (PROTONIX) 40 MG tablet Take 40 mg by mouth 2 (two) times daily. 02/01/15   [provider]  promethazine (PHENERGAN) 25 MG tablet Take 25 mg by mouth every 8 (eight) hours as needed for nausea or vomiting.    [provider]  simvastatin (ZOCOR) 40 MG tablet Take 40 mg by mouth daily.    [provider]  sodium chloride (OCEAN) 0.65 % SOLN nasal spray Place 2 sprays into both nostrils 3 (three) times daily as needed for congestion.     [provider]  traZODone (DESYREL) 150 MG tablet Take 300 mg by mouth at bedtime.    [provider]  varenicline (CHANTIX) 0.5 MG tablet TAKE 1 TABLET BY MOUTH TWICE A  DAY 12/11/22   Serena Croissant, MD  venlafaxine XR (EFFEXOR-XR) 150 MG 24 hr capsule Take 150 mg by mouth daily with breakfast.  [provider]     Critical care time: 34 min

## 2023-10-17 NOTE — Progress Notes (Signed)
eLink Physician-Brief Progress Note Patient Name: April [name] DOB: 13-Jun-1956 MRN: [id number]   Date of Service  10/17/2023  HPI/Events of Note  Patient now wake, agitated asking for her meds. BSRN had to restart Cardene drip for SBP > 160  eICU Interventions  Klonopin 0.5 mg TID prn ordered. This is how she takes it as per her home medication list     Intervention Category Minor Interventions: Agitation / anxiety - evaluation and management  Darl Pikes 10/17/2023, 10:28 PM

## 2023-10-17 NOTE — H&P (Signed)
NEUROLOGY H&P NOTE   Date of service: October 17, 2023 Patient Name: April [name] MRN:  [id number] DOB:  10/26/1956 Chief Complaint: "left basal ganglia ICH"  History of Present Illness  April [name] is a 67 y.o. female with hx of GERd, Htn, HLD, COPD and current everyday smoker who presents with R sided weakness to [facility] ED. She initially was awake. Reported symptoms started around 1600 on 10/16/23. She called EMS and was brought in to [facility] ED. She had CT head w/o contrast which was notable for left basal ganglia small ICH with an ICH score of 0. CT angio head and neck with no LVO, no aneurysm. She became more somnolent and was intubated prior to arrival. No trauma, she is not on anticoagulation. She uses marijuana.  Last known well: 1600 on 10/16/23 Modified rankin score: 0-Completely asymptomatic and back to baseline post- stroke ICH Score: 0 tNKASE: Not offered due to ICH Thrombectomy: not offered due to ICH NIHSS components Score: Comment  1a Level of Conscious 0[]  1[]  2[]  3[x]      1b LOC Questions 0[]  1[]  2[x]       1c LOC Commands 0[]  1[]  2[x]       2 Best Gaze 0[x]  1[]  2[]       3 Visual 0[x]  1[]  2[]  3[]      4 Facial Palsy 0[x]  1[]  2[]  3[]      5a Motor Arm - left 0[]  1[]  2[]  3[x]  4[]  UN[]    5b Motor Arm - Right 0[]  1[]  2[]  3[x]  4[]  UN[]    6a Motor Leg - Left 0[]  1[]  2[]  3[x]  4[]  UN[]    6b Motor Leg - Right 0[]  1[]  2[]  3[x]  4[]  UN[]    7 Limb Ataxia 0[x]  1[]  2[]  3[]  UN[]     8 Sensory 0[x]  1[]  2[]  UN[]      9 Best Language 0[]  1[]  2[]  3[x]      10 Dysarthria 0[]  1[]  2[x]  UN[]      11 Extinct. and Inattention 0[x]  1[]  2[]       TOTAL: 24      ROS  Unable to perform due to intubation and sedation.  Past History   Past Medical History:  Diagnosis Date   Anxiety    Arthritis    Cancer (HCC)    COPD (chronic obstructive pulmonary disease) (HCC)    Depression    GERD (gastroesophageal reflux disease)    Headache(784.0)    Hyperlipidemia    Hypertension    hx    Incontinent of urine    Oxygen dependent    2 L via [facility] 24 hours per day   Pneumonia    Shortness of breath    on oxygen   Stroke [facility])    Past Surgical History:  Procedure Laterality Date   CAROTID STENT     Has known BICA occlusions since 2011; s/p left vertebral artery stent 12/26/09 & right VA stent 02/12/10   CHOLECYSTECTOMY     PORT-A-CATH REMOVAL N/A 12/01/2022   Procedure: REMOVAL PORT-A-CATH;  Surgeon: Griselda Miner, MD;  Location: [facility] OR;  Service: General;  Laterality: N/A;   PORTACATH PLACEMENT Right 08/19/2021   Procedure: INSERTION PORT-A-CATH WITH ULTRASOUND;  Surgeon: Griselda Miner, MD;  Location: [facility] OR;  Service: General;  Laterality: Right;   ROTATOR CUFF REPAIR     SENTINEL NODE BIOPSY Bilateral 08/19/2021   Procedure: BILATERAL SENTINEL NODE BIOPSY;  Surgeon: Griselda Miner, MD;  Location: [facility] OR;  Service: General;  Laterality: Bilateral;   TOTAL MASTECTOMY Bilateral 08/19/2021  Procedure: BILATERAL TOTAL MASTECTOMY;  Surgeon: Griselda Miner, MD;  Location: [facility] OR;  Service: General;  Laterality: Bilateral;   TUBAL LIGATION     Family History  Problem Relation Age of Onset   Migraines Mother    Heart attack Mother    Bone cancer Maternal Aunt    Bone cancer Maternal Aunt    Breast cancer Cousin    Breast cancer Cousin    Social History   Socioeconomic History   Marital status: Divorced    Spouse name: Not on file   Number of children: 1   Years of education: 12th   Highest education level: Not on file  Occupational History   Occupation: disabled  Tobacco Use   Smoking status: Every Day    Current packs/day: 1.00    Average packs/day: 1 pack/day for 40.0 years (40.0 ttl pk-yrs)    Types: Cigarettes    Passive exposure: Past   Smokeless tobacco: Never   Tobacco comments:    Pt states she smokes 1 pack daily as of 11/07/2022 LW  Vaping Use   Vaping status: Never Used  Substance and Sexual Activity   Alcohol use: No   Drug use: Yes    Types:  Marijuana    Comment: Last use was in mid January 2024   Sexual activity: Not Currently    Birth control/protection: Post-menopausal  Other Topics Concern   Not on file  Social History Narrative   Patient lives at home alone.   Patient drink diet coke  (4 daily avg).   Social Drivers of Corporate investment banker Strain: Not on file  Food Insecurity: No Food Insecurity (10/01/2022)   Hunger Vital Sign    Worried About Running Out of Food in the Last Year: Never true    Ran Out of Food in the Last Year: Never true  Transportation Needs: No Transportation Needs (10/01/2022)   PRAPARE - Administrator, Civil Service (Medical): No    Lack of Transportation (Non-Medical): No  Physical Activity: Not on file  Stress: Not on file  Social Connections: Not on file   No Known Allergies  Medications   Medications Prior to Admission  Medication Sig Dispense Refill Last Dose/Taking   acetaminophen (TYLENOL) 500 MG tablet Take 1,000 mg by mouth every 8 (eight) hours as needed for moderate pain.      albuterol (VENTOLIN HFA) 108 (90 Base) MCG/ACT inhaler Inhale 2 puffs into the lungs every 6 (six) hours as needed for wheezing or shortness of breath.      clonazePAM (KLONOPIN) 0.5 MG tablet Take 0.5 mg by mouth in the morning, at noon, and at bedtime.  5    clopidogrel (PLAVIX) 75 MG tablet Take 75 mg by mouth daily.      fluticasone (FLONASE) 50 MCG/ACT nasal spray Place 2 sprays into both nostrils daily as needed for allergies or rhinitis.      fluticasone-salmeterol (ADVAIR) 100-50 MCG/ACT AEPB Inhale one dose twice a day for lung disease      Fluticasone-Umeclidin-Vilant (TRELEGY ELLIPTA) 100-62.5-25 MCG/ACT AEPB TAKE 1 PUFF BY MOUTH EVERY DAY 60 each 5    furosemide (LASIX) 20 MG tablet Take 20 mg by mouth daily as needed for edema.      HYDROcodone-acetaminophen (NORCO/VICODIN) 5-325 MG tablet Take 1 tablet by mouth 2 (two) times daily as needed for moderate pain.       HYDROcodone-acetaminophen (NORCO/VICODIN) 5-325 MG tablet Take 1 tablet by mouth every 4 (  four) hours as needed for moderate pain. 10 tablet 0    letrozole (FEMARA) 2.5 MG tablet Take 1 tablet (2.5 mg total) by mouth daily. 90 tablet 3    montelukast (SINGULAIR) 10 MG tablet Take 10 mg by mouth daily as needed (allergies).      pantoprazole (PROTONIX) 40 MG tablet Take 40 mg by mouth 2 (two) times daily.      promethazine (PHENERGAN) 25 MG tablet Take 25 mg by mouth every 8 (eight) hours as needed for nausea or vomiting.      simvastatin (ZOCOR) 40 MG tablet Take 40 mg by mouth daily.      sodium chloride (OCEAN) 0.65 % SOLN nasal spray Place 2 sprays into both nostrils 3 (three) times daily as needed for congestion.       traZODone (DESYREL) 150 MG tablet Take 300 mg by mouth at bedtime.      varenicline (CHANTIX) 0.5 MG tablet TAKE 1 TABLET BY MOUTH TWICE A DAY 180 tablet 1    venlafaxine XR (EFFEXOR-XR) 150 MG 24 hr capsule Take 150 mg by mouth daily with breakfast.        Vitals   Vitals:   10/17/23 0145 10/17/23 0150 10/17/23 0315 10/17/23 0346  BP:      Pulse: 83 77    Resp: (!) 22 (!) 22    Temp:    98.3 F (36.8 C)  TempSrc:    Oral  SpO2: 99% 98% 93%   Weight:      Height:         Body mass index is 42.02 kg/m.  Physical Exam   General: Laying comfortably in bed; in no acute distress.  HENT: Normal oropharynx and mucosa. Normal external appearance of ears and nose.  Neck: Supple, no pain or tenderness  CV: No JVD. No peripheral edema.  Pulmonary: Symmetric Chest rise. Breathing over vent. Abdomen: Soft to touch, non-tender.  Ext: No cyanosis, edema, or deformity  Skin: No rash. Normal palpation of skin.   Musculoskeletal: Normal digits and nails by inspection. No clubbing.   Neurologic Examination  Mental status/Cognition: no response to voice or loud clap. Grimaces to tactile stimulation and moves around in the bed. Speech/language: mute, no speech, does not  follow commands. Cranial nerves:   CN II Pupils equal and reactive to light, unable to assess for VF deficits.   CN III,IV,VI EOMI intact to dolls eyes, does not make eye contact. No nystagmus.   CN V Corneals intact BL   CN VII Symmetric facial grimace   CN VIII Does not turn head towards speech   CN IX & X Cough and gag intact.   CN XI Head is midline   CN XII Tongus pushed to the floor of the mouth by ETT.   Sensory/Motor:  Muscle bulk:normal, tone flaccid Localizes to proximal pinch in all extremities. Spontaneously moving all extremities and moves BL uppers antigravity.  Coordination/Complex Motor:  Unable to assess.  Labs   CBC:  Recent Labs  Lab 10/16/23 2145  WBC 9.7  NEUTROABS 7.8*  HGB 15.3*  HCT 49.7*  MCV 90.5  PLT 174    Basic Metabolic Panel:  Lab Results  Component Value Date   NA 138 10/16/2023   K 3.7 10/16/2023   CO2 33 (H) 10/16/2023   GLUCOSE 110 (H) 10/16/2023   BUN 10 10/16/2023   CREATININE 0.90 10/16/2023   CALCIUM 9.1 10/16/2023   GFRNONAA >60 10/16/2023   GFRAA >60 11/12/2015  Lipid Panel:  Lab Results  Component Value Date   LDLCALC 41 11/11/2015   HgbA1c:  Lab Results  Component Value Date   HGBA1C 5.9 (H) 11/11/2015   Urine Drug Screen:     Component Value Date/Time   LABOPIA NONE DETECTED 11/10/2015 2055   COCAINSCRNUR NONE DETECTED 11/10/2015 2055   LABBENZ NONE DETECTED 11/10/2015 2055   AMPHETMU NONE DETECTED 11/10/2015 2055   THCU NONE DETECTED 11/10/2015 2055   LABBARB NONE DETECTED 11/10/2015 2055    Alcohol Level     Component Value Date/Time   ETH <10 10/16/2023 2145   INR  Lab Results  Component Value Date   INR 1.0 10/16/2023   APTT  Lab Results  Component Value Date   APTT 23 (L) 10/16/2023     CT Head without contrast(Personally reviewed): L BG ICH  Repeat CT Head w/o contrast(Personally reviewed): L BG ICH is stable.  CT angio Head and Neck with contrast(Personally reviewed): No LVO,  no aneurysm.  MRI Brain: Pending  MR Venogram: Pending.  Impression   April [name] is a 67 y.o. female with hx of GERd, Htn, HLD, COPD and current everyday smoker who presents with R sided weakness to [facility] ED. She was found to have L BG ICH, mentation declined and she was intubated. Repeat CT Head with stable ICH.  Etiology of the ICH is unclear, could be hemorrhagic transformation vs hypertensive hemorrhage. However, she has been maintaining her blood pressure fairly well.  Primary Diagnosis:  Nontraumatic intracerebral hemorrhage in hemisphere, subcortical  Secondary Diagnosis: Acute respiratory failure with hypoxia and Morbid Obesity(BMI > 40)  Recommendations  Left Basal Ganglia ICH with ICH score of 0: - Admit to ICU - Stability scan in 6 hours or STAT with any neurological decline - Frequent neuro checks; q11min for 1 hour, then q1hour - No antiplatelets or anticoagulants due to ICH - SCD for DVT prophylaxis, pharmacological DVT ppx at 24 hours if ICH is stable - Blood pressure control with goal systolic 130 - 150, cleverplex and labetalol PRN - Stroke labs, HgbA1c, fasting lipid panel - MRI brain with and without contrast when stabilized to evaluate for underlying mass - MR Venogam to evaluate for potential CVST. Althou low risk given the location of her ICH. - Risk factor modification - Echocardiogram - PT consult, OT consult, Speech consult. - Stroke team to follow ______________________________________________________________________   Code status verified with patient's son who reports that patient wanted to be DNR/DNI. Allergies verified and patient has no known allergies.  This patient is critically ill and at significant risk of neurological worsening, death and care requires constant monitoring of vital signs, hemodynamics,respiratory and cardiac monitoring, neurological assessment, discussion with family, other specialists and medical decision making of  high complexity. I spent 50 minutes of neurocritical care time  in the care of  this patient. This was time spent independent of any time provided by nurse practitioner or PA.  Erick Blinks [facility] 10/17/2023  4:21 AM  Signed,  Erick Blinks, MD [facility]

## 2023-10-17 NOTE — ED Notes (Addendum)
20 of Etomidate in @ 0023 100 Succinylcholine in @ 0023  7.5 ETT 24 @ the lip 0025 Positive color change

## 2023-10-17 NOTE — Procedures (Signed)
Extubation Procedure Note  Patient Details:   Name: April [name] DOB: [birth year] MRN: [id number]   Airway Documentation:  Airway 7.5 mm (Active)  Secured at (cm) 23 cm 10/17/23 1056  Measured From Lips 10/17/23 1056  Secured Location Left 10/17/23 1056  Secured By [employer] 10/17/23 1056  Bite Block Yes 10/17/23 1056  Tube Holder Repositioned Yes 10/17/23 1056  Prone position No 10/17/23 1056  Cuff Pressure (cm H2O) Green OR 18-26 CmH2O 10/17/23 1056  Site Condition Dry 10/17/23 1056   Vent end date: 10/17/23 Vent end time: 1206   Evaluation  O2 sats: stable throughout Complications: No apparent complications Patient did tolerate procedure well. Bilateral Breath Sounds: Diminished   Yes  Pt extubated to 4l [place] with RN at bedside. No cuff leak noted, MD aware. Pt is tolerating well. RT will monitor.  Lajuan Lines 10/17/2023, 12:11 PM

## 2023-10-17 NOTE — Progress Notes (Signed)
150 cc of fentanyl wasted in stericycle with Marcina Millard, RN.

## 2023-10-17 NOTE — ED Notes (Signed)
Dr. Elayne Snare and respiratory at bedside

## 2023-10-17 NOTE — Progress Notes (Addendum)
eLink Physician-Brief Progress Note Patient Name: April [name] DOB: [birth year] MRN: [id number]   Date of Service  10/17/2023  HPI/Events of Note  3F with COPD, HTN found down and EMS called however patient was awake at that time but presented later to ED with right sided weakness. CT head with left basal ganglia ICH. Intubated for airway protection. PCCM consulted for vent management  On camera check sedated with fentanyl and propofol on MV  ABG reviewed and appropriate with chronic hypercarbia.   CXR with ETT in place  eICU Interventions  SBP 130-150. No IV meds needed at this time Imaging per stroke/primary team Remain on full vent support. PCCM following.  SBT/WUA after imaging completed     Intervention Category Evaluation Type: New Patient Evaluation  [name] [name] 10/17/2023, 4:31 AM

## 2023-10-17 NOTE — Progress Notes (Addendum)
[facility] ADULT ICU REPLACEMENT PROTOCOL   The patient does apply for the [facility] Adult ICU Electrolyte Replacment Protocol based on the criteria listed below:   1.Exclusion criteria: TCTS, ECMO, Dialysis, and Myasthenia Gravis patients 2. Is GFR >/= 30 ml/min? Yes.    Patient's GFR today is >60 3. Is SCr </= 2? Yes.   Patient's SCr is 1.02  mg/dL 4. Did SCr increase >/= 0.5 in 24 hours? No. 5.Pt's weight >40kg  Yes.   6. Abnormal electrolyte(s): K+ = 3.5 7. Electrolytes replaced per protocol 8.  Call MD STAT for K+ </= 2.5, Phos </= 1, or Mag </= 1 Physician:  Everardo All, eMD  Suzan Slick Louretta Tantillo 10/17/2023 6:02 AM

## 2023-10-17 NOTE — Progress Notes (Signed)
Pt transported with RT and RN to and from MRI, VSS no issues. Daughter in law at bedside, updated.

## 2023-10-17 NOTE — Progress Notes (Signed)
Transported from ED 1 to CT scan then back to ED 1 on ventilator. No adverse events noted patient tolerated well VSS throughout.

## 2023-10-17 NOTE — Progress Notes (Signed)
eLink Physician-Brief Progress Note Patient Name: April [name] DOB: [birth year] MRN: [id number]   Date of Service  10/17/2023  HPI/Events of Note  Patient extubated @ 1200, passed bedside swallow and is now on clear liq. BSRN noted multiple PTA meds ie Klonopin and trazodone if these can be restarted.  Patient seen asleep at this time  eICU Interventions  Would want to avoid sedating medications so as not to confound neurologic evaluation given ICH     Intervention Category Intermediate Interventions: Other:  Darl Pikes 10/17/2023, 9:24 PM

## 2023-10-18 ENCOUNTER — Inpatient Hospital Stay (HOSPITAL_COMMUNITY): Payer: PPO

## 2023-10-18 DIAGNOSIS — I10 Essential (primary) hypertension: Secondary | ICD-10-CM

## 2023-10-18 DIAGNOSIS — I619 Nontraumatic intracerebral hemorrhage, unspecified: Secondary | ICD-10-CM | POA: Diagnosis not present

## 2023-10-18 LAB — GLUCOSE, CAPILLARY
Glucose-Capillary: 105 mg/dL — ABNORMAL HIGH (ref 70–99)
Glucose-Capillary: 87 mg/dL (ref 70–99)
Glucose-Capillary: 116 mg/dL — ABNORMAL HIGH (ref 70–99)
Glucose-Capillary: 101 mg/dL — ABNORMAL HIGH (ref 70–99)
Glucose-Capillary: 102 mg/dL — ABNORMAL HIGH (ref 70–99)
Glucose-Capillary: 98 mg/dL (ref 70–99)

## 2023-10-18 LAB — CBC
HCT: 43.6 % (ref 36.0–46.0)
Hemoglobin: 13.4 g/dL (ref 12.0–15.0)
MCH: 27.5 pg (ref 26.0–34.0)
MCHC: 30.7 g/dL (ref 30.0–36.0)
MCV: 89.3 fL (ref 80.0–100.0)
Platelets: 143 10*3/uL — ABNORMAL LOW (ref 150–400)
RBC: 4.88 MIL/uL (ref 3.87–5.11)
RDW: 14.9 % (ref 11.5–15.5)
WBC: 9.7 10*3/uL (ref 4.0–10.5)
nRBC: 0 % (ref 0.0–0.2)

## 2023-10-18 LAB — COMPREHENSIVE METABOLIC PANEL
ALT: 10 U/L (ref 0–44)
AST: 13 U/L — ABNORMAL LOW (ref 15–41)
Albumin: 2.8 g/dL — ABNORMAL LOW (ref 3.5–5.0)
Alkaline Phosphatase: 31 U/L — ABNORMAL LOW (ref 38–126)
Anion gap: 9 (ref 5–15)
BUN: <5 mg/dL — ABNORMAL LOW (ref 8–23)
CO2: 32 mmol/L (ref 22–32)
Calcium: 8.5 mg/dL — ABNORMAL LOW (ref 8.9–10.3)
Chloride: 100 mmol/L (ref 98–111)
Creatinine, Ser: 0.85 mg/dL (ref 0.44–1.00)
GFR, Estimated: >60 mL/min (ref >60)
Glucose, Bld: 107 mg/dL — ABNORMAL HIGH (ref 70–99)
Potassium: 3.9 mmol/L (ref 3.5–5.1)
Sodium: 141 mmol/L (ref 135–145)
Total Bilirubin: 0.6 mg/dL (ref <1.2)
Total Protein: 5.7 g/dL — ABNORMAL LOW (ref 6.5–8.1)

## 2023-10-18 LAB — ECHOCARDIOGRAM COMPLETE
AR max vel: 1.88 cm2
AV Area VTI: 1.86 cm2
AV Area mean vel: 1.82 cm2
AV Mean grad: 8 mm[Hg]
AV Peak grad: 13.1 mm[Hg]
Ao pk vel: 1.81 m/s
Area-P 1/2: 2.47 cm2
Height: 63 in
S' Lateral: 3.4 cm
Weight: 3636.71 [oz_av]

## 2023-10-18 LAB — TRIGLYCERIDES: Triglycerides: 107 mg/dL (ref <150)

## 2023-10-18 MED ORDER — SENNOSIDES-DOCUSATE SODIUM 8.6-50 MG PO TABS
1.0000 | ORAL_TABLET | Freq: Two times a day (BID) | ORAL | Status: DC
  Administered 2023-10-18 – 2023-10-24 (×6): 1 via ORAL
  Filled 2023-10-18 (×12): qty 1

## 2023-10-18 MED ORDER — VENLAFAXINE HCL ER 75 MG PO CP24
150.0000 mg | ORAL_CAPSULE | Freq: Every day | ORAL | Status: DC
  Administered 2023-10-18 – 2023-10-26 (×9): 150 mg via ORAL
  Filled 2023-10-18: qty 1
  Filled 2023-10-18 (×8): qty 2

## 2023-10-18 MED ORDER — ENOXAPARIN SODIUM 40 MG/0.4ML IJ SOSY
40.0000 mg | PREFILLED_SYRINGE | Freq: Every day | INTRAMUSCULAR | Status: DC
  Administered 2023-10-18 – 2023-10-25 (×8): 40 mg via SUBCUTANEOUS
  Filled 2023-10-18 (×8): qty 0.4

## 2023-10-18 MED ORDER — FAMOTIDINE 20 MG PO TABS
20.0000 mg | ORAL_TABLET | Freq: Two times a day (BID) | ORAL | Status: DC
Start: 2023-10-18 — End: 2023-10-26
  Administered 2023-10-18 – 2023-10-26 (×16): 20 mg via ORAL
  Filled 2023-10-18 (×16): qty 1

## 2023-10-18 MED ORDER — TRAZODONE HCL 100 MG PO TABS
300.0000 mg | ORAL_TABLET | Freq: Every day | ORAL | Status: DC
  Administered 2023-10-18 – 2023-10-25 (×8): 300 mg via ORAL
  Filled 2023-10-18 (×8): qty 3

## 2023-10-18 MED ORDER — ROSUVASTATIN CALCIUM 5 MG PO TABS
10.0000 mg | ORAL_TABLET | Freq: Every day | ORAL | Status: DC
  Administered 2023-10-19 – 2023-10-26 (×8): 10 mg via ORAL
  Filled 2023-10-18 (×8): qty 2

## 2023-10-18 NOTE — Progress Notes (Signed)
PCCM:  CCM will sign off. Doing well post extubation.   Josephine Igo, DO [facility] 10/18/2023 7:25 AM

## 2023-10-18 NOTE — Evaluation (Signed)
Physical Therapy Evaluation Patient Details Name: April [name] MRN: [id number] DOB: 1955/12/01 Today's Date: 10/18/2023  History of Present Illness  Pt is a 67 y.o. female presenting 12/20 with R weakness after fall in her bathroom; down time of 1 hour. 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 Intubated 12/20-12/21 PMH significant for GERD, HTN, HLD, COPD, smoker, bil breast ca, CVA.  Clinical Impression  Pt admitted with above diagnosis and presents to PT with functional limitations due to deficits listed below (See PT problem list). Pt needs skilled PT to maximize independence and safety. Pt with significant deficits in strength, balance and cognition. Likely will need extended rehab. Patient will benefit from continued inpatient follow up therapy, <3 hours/day           If plan is discharge home, recommend the following: Two people to help with walking and/or transfers;Two people to help with bathing/dressing/bathroom;Direct supervision/assist for financial management;Direct supervision/assist for medications management;Help with stairs or ramp for entrance;Assist for transportation   Can travel by private vehicle   No    Equipment Recommendations Other (comment) (to be determined at next venue)  Recommendations for Other Services       Functional Status Assessment Patient has had a recent decline in their functional status and demonstrates the ability to make significant improvements in function in a reasonable and predictable amount of time.     Precautions / Restrictions Precautions Precautions: Fall;Other (comment) Precaution Comments: R neglect/inattention Restrictions Weight Bearing Restrictions Per Provider Order: No      Mobility  Bed Mobility Overal bed mobility: Needs Assistance Bed Mobility: Rolling, Sidelying to Sit, Sit to Supine Rolling: Min assist, Mod assist Sidelying to sit: +2 for physical assistance, Max assist   Sit to supine: +2  for physical assistance, Max assist   General bed mobility comments: Rolling lt with min assist to initiate and provide safety. Rolling rt initially mod assist due to inattention to rt. Eventually rolling rt with min assist    Transfers                   General transfer comment: did not attempt due to very poor sitting balance    Ambulation/Gait                  Stairs            Wheelchair Mobility     Tilt Bed    Modified Rankin (Stroke Patients Only) Modified Rankin (Stroke Patients Only) Pre-Morbid Rankin Score: No symptoms Modified Rankin: Severe disability     Balance Overall balance assessment: Needs assistance Sitting-balance support: Bilateral upper extremity supported, Feet supported Sitting balance-Leahy Scale: Poor Sitting balance - Comments: Sat EOB x 10 minutes with mod to max assist due to heavy rt lean                                     Pertinent Vitals/Pain Pain Assessment Pain Assessment: Faces Faces Pain Scale: No hurt    Home Living Family/patient expects to be discharged to:: Private residence Living Arrangements: Alone Available Help at Discharge: Family;Neighbor Type of Home: Apartment Home Access: Level entry;Elevator       Home Layout: One level        Prior Function Prior Level of Function : Independent/Modified Independent;Driving             Mobility Comments: No AD ADLs Comments: Pt  reports son does grocery shopping     Extremity/Trunk Assessment   Upper Extremity Assessment Upper Extremity Assessment: Defer to OT evaluation RUE Deficits / Details: Poor attention to R side affecting functional use and grasp of feeding/grooming supplies. strength 4/5 but unable to hold for more than ~1 second bout RUE Sensation: decreased proprioception RUE Coordination: decreased fine motor;decreased gross motor (able to give thumbs up but not OK) LUE Deficits / Details: Questionably decreaed  coordination; difficulty making Ok sumbol with LUE.    Lower Extremity Assessment Lower Extremity Assessment: Generalized weakness;RLE deficits/detail RLE Deficits / Details: difficult to discern weakness vs decr attention to rt       Communication   Communication Communication: Difficulty communicating thoughts/reduced clarity of speech (incresed time for verbal expression. Increaed time to process questions and commands as well`) Cueing Techniques: Verbal cues;Gestural cues;Tactile cues  Cognition Arousal: Alert Behavior During Therapy: Restless, Flat affect Overall Cognitive Status: No family/caregiver present to determine baseline cognitive functioning Area of Impairment: Attention, Memory, Following commands, Safety/judgement, Awareness, Problem solving                   Current Attention Level: Focused Memory: Decreased short-term memory Following Commands: Follows one step commands inconsistently, Follows one step commands with increased time (needing up to mod cues to do so) Safety/Judgement: Decreased awareness of safety, Decreased awareness of deficits Awareness: Intellectual Problem Solving: Slow processing, Decreased initiation, Difficulty sequencing, Requires verbal cues, Requires tactile cues General Comments: Decr attention to rt. Repeated cues to answer questions and to follow commands.        General Comments General comments (skin integrity, edema, etc.): VSS on O2    Exercises     Assessment/Plan    PT Assessment Patient needs continued PT services  PT Problem List Decreased strength;Decreased activity tolerance;Decreased balance;Decreased mobility;Decreased cognition;Decreased knowledge of use of DME;Decreased safety awareness;Decreased knowledge of precautions;Impaired sensation       PT Treatment Interventions DME instruction;Functional mobility training;Therapeutic activities;Therapeutic exercise;Balance training;Cognitive  remediation;Neuromuscular re-education;Patient/family education    PT Goals (Current goals can be found in the Care Plan section)  Acute Rehab PT Goals Patient Stated Goal: not stated PT Goal Formulation: Patient unable to participate in goal setting Time For Goal Achievement: 11/01/23 Potential to Achieve Goals: Fair    Frequency Min 1X/week     Co-evaluation PT/OT/SLP Co-Evaluation/Treatment: Yes Reason for Co-Treatment: Complexity of the patient's impairments (multi-system involvement);Necessary to address cognition/behavior during functional activity;For patient/therapist safety PT goals addressed during session: Mobility/safety with mobility;Balance         AM-PAC PT "6 Clicks" Mobility  Outcome Measure Help needed turning from your back to your side while in a flat bed without using bedrails?: A Lot Help needed moving from lying on your back to sitting on the side of a flat bed without using bedrails?: Total Help needed moving to and from a bed to a chair (including a wheelchair)?: Total Help needed standing up from a chair using your arms (e.g., wheelchair or bedside chair)?: Total Help needed to walk in hospital room?: Total Help needed climbing 3-5 steps with a railing? : Total 6 Click Score: 7    End of Session Equipment Utilized During Treatment: Oxygen Activity Tolerance: Patient tolerated treatment well Patient left: in bed;with call bell/phone within reach;with bed alarm set Nurse Communication: Mobility status;Need for lift equipment PT Visit Diagnosis: Other abnormalities of gait and mobility (R26.89);Muscle weakness (generalized) (M62.81);Other symptoms and signs involving the nervous system (R29.898)    Time:  1610-9604 PT Time Calculation (min) (ACUTE ONLY): 33 min   Charges:   PT Evaluation $PT Eval Moderate Complexity: 1 Mod   PT General Charges $$ ACUTE PT VISIT: 1 Visit         [facility] PT Acute Rehabilitation Services Office  [phone]   Angelina Ok [facility] 10/18/2023, 4:30 PM

## 2023-10-18 NOTE — Progress Notes (Signed)
*  PRELIMINARY RESULTS* Echocardiogram 2D Echocardiogram has been performed.  Sager Drain 10/18/2023, 2:29 PM

## 2023-10-18 NOTE — Progress Notes (Signed)
SLP Cancellation Note  Patient Details Name: April [name] MRN: [id number] DOB: January 19, 1956   Cancelled treatment:       Reason Eval/Treat Not Completed: Fatigue/lethargy limiting ability to participate- unable to awaken in order to participate in speech/language eval. Will continue efforts.  Alletta Mattos L. Samson Frederic, MA CCC/SLP Clinical Specialist - Acute Care SLP Acute Rehabilitation Services Office number [phone]    Blenda Mounts Laurice 10/18/2023, 10:56 AM

## 2023-10-18 NOTE — Evaluation (Signed)
Occupational Therapy Evaluation Patient Details Name: April [name] MRN: [id number] DOB: 04-29-1956 Today's Date: 10/18/2023   History of Present Illness Pt is a 67 y.o. female presenting 12/20 with R weakness after fall in her bathroom; down time of 1 hour. 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 Intubated 12/20-12/21 PMH significant for GERD, HTN, HLD, COPD, smoker, bil breast ca, CVA.   Clinical Impression   PTA, pt lived alone and was mod I in her home; son does grocery shopping for her. Upon eval, pt with decreased attention to R side of body and environment, safety, coordination, motor planning, balance, and midline orientation. Pt needing min-mod A +2 for bed mobility; total A for pericare x2. Sitting EOB with constant R lean and need for external support. Patient will benefit from continued inpatient follow up therapy, <3 hours/day       If plan is discharge home, recommend the following: Two people to help with walking and/or transfers;Two people to help with bathing/dressing/bathroom;Assistance with feeding;Assistance with cooking/housework;Direct supervision/assist for medications management;Direct supervision/assist for financial management;Assist for transportation;Supervision due to cognitive status;Help with stairs or ramp for entrance    Functional Status Assessment  Patient has had a recent decline in their functional status and demonstrates the ability to make significant improvements in function in a reasonable and predictable amount of time.  Equipment Recommendations  Other (comment) (defer)    Recommendations for Other Services Speech consult (language)     Precautions / Restrictions Precautions Precautions: Fall;Other (comment) Precaution Comments: R neglect/inattention Restrictions Weight Bearing Restrictions Per Provider Order: No      Mobility Bed Mobility Overal bed mobility: Needs Assistance Bed Mobility: Rolling, Sidelying to Sit,  Sit to Supine Rolling: Min assist, Mod assist Sidelying to sit: +2 for physical assistance, Max assist   Sit to supine: +2 for physical assistance, Max assist   General bed mobility comments: Rolling lt with min assist to initiate and provide safety. Rolling rt initially mod assist due to inattention to rt. Eventually rolling rt with min assist    Transfers                   General transfer comment: did not attempt due to very poor sitting balance      Balance Overall balance assessment: Needs assistance Sitting-balance support: Bilateral upper extremity supported, Feet supported Sitting balance-Leahy Scale: Poor Sitting balance - Comments: Sat EOB x 10 minutes with mod to max assist due to heavy rt lean                                   ADL either performed or assessed with clinical judgement   ADL Overall ADL's : Needs assistance/impaired Eating/Feeding: Bed level;Maximal assistance Eating/Feeding Details (indicate cue type and reason): to attend to task and sustain grasp on fork Grooming: Minimal assistance;Bed level;Wash/dry face Grooming Details (indicate cue type and reason): Min A for sustaining task Upper Body Bathing: Maximal assistance;Sitting   Lower Body Bathing: Total assistance;+2 for physical assistance;+2 for safety/equipment   Upper Body Dressing : Maximal assistance   Lower Body Dressing: Total assistance;+2 for physical assistance;+2 for safety/equipment;Sit to/from stand                       Vision Baseline Vision/History: 1 Wears glasses Patient Visual Report: Other (comment) (pt unable to report) Vision Assessment?: Vision impaired- to be further tested in functional context  Additional Comments: Per RN visual fields in tact, however, poor attention and unable to follow commands to participate in visual assessment well. Attempted visual tracking, but would initiate and not follow. dif follow toward L but not back R  through midline. Can scan to R side with up to max cues     Perception Perception: Impaired Preception Impairment Details: Inattention/Neglect Perception-Other Comments: Per RN visual fields in tact, however, poor attention and unable to follow commands to participate in visual assessment well. Attempted visual tracking, but would initiate and not follow. dif follow toward L but not back R through midline. Can scan to R side with up to max cues   Praxis Praxis: Impaired Praxis Impairment Details: Motor planning     Pertinent Vitals/Pain Pain Assessment Pain Assessment: Faces Faces Pain Scale: No hurt     Extremity/Trunk Assessment Upper Extremity Assessment Upper Extremity Assessment: Right hand dominant;RUE deficits/detail;LUE deficits/detail RUE Deficits / Details: Poor attention to R side affecting functional use and grasp of feeding/grooming supplies. strength 4/5 but unable to hold for more than ~1 second bout RUE Sensation: decreased proprioception RUE Coordination: decreased fine motor;decreased gross motor (able to give thumbs up but not OK) LUE Deficits / Details: Questionably decreaed coordination; difficulty making Ok sumbol with LUE.   Lower Extremity Assessment Lower Extremity Assessment: Defer to PT evaluation RLE Deficits / Details: difficult to discern weakness vs decr attention to rt       Communication Communication Communication: Difficulty communicating thoughts/reduced clarity of speech (incresed time for verbal expression. Increaed time to process questions and commands as well`) Cueing Techniques: Verbal cues;Gestural cues;Tactile cues   Cognition Arousal: Alert Behavior During Therapy: Restless, Flat affect Overall Cognitive Status: No family/caregiver present to determine baseline cognitive functioning Area of Impairment: Attention, Memory, Following commands, Safety/judgement, Awareness, Problem solving                   Current Attention Level:  Focused Memory: Decreased short-term memory Following Commands: Follows one step commands inconsistently, Follows one step commands with increased time (needing up to mod cues to do so) Safety/Judgement: Decreased awareness of safety, Decreased awareness of deficits Awareness: Intellectual Problem Solving: Slow processing, Decreased initiation, Difficulty sequencing, Requires verbal cues, Requires tactile cues General Comments: Needing additional cues to answer questions. following basic commands, but intermuttently needing up to mod cues to follow commands. Signficiatnly decreaed attention to R side of body and environment. Signifciantly increased time for following commands/answering questions at times, but provided prior living environment info consistent with prior admisssion     General Comments  VSS on supplemental O2    Exercises     Shoulder Instructions      Home Living Family/patient expects to be discharged to:: Private residence Living Arrangements: Alone Available Help at Discharge: Family;Neighbor Type of Home: Apartment Home Access: Level entry;Elevator     Home Layout: One level     Bathroom Shower/Tub: Chief Strategy Officer: Standard                Prior Functioning/Environment Prior Level of Function : Independent/Modified Independent             Mobility Comments: No AD ADLs Comments: Pt reports son does grocery shopping        OT Problem List: Decreased strength;Decreased activity tolerance;Impaired balance (sitting and/or standing);Impaired vision/perception;Decreased coordination;Decreased cognition;Decreased safety awareness;Decreased knowledge of use of DME or AE;Impaired UE functional use      OT Treatment/Interventions: Self-care/ADL training;Therapeutic exercise;DME and/or  AE instruction;Therapeutic activities;Cognitive remediation/compensation;Visual/perceptual remediation/compensation;Patient/family education;Balance  training    OT Goals(Current goals can be found in the care plan section) Acute Rehab OT Goals Patient Stated Goal: none stated OT Goal Formulation: With patient Time For Goal Achievement: 11/01/23 Potential to Achieve Goals: Fair  OT Frequency: Min 1X/week    Co-evaluation PT/OT/SLP Co-Evaluation/Treatment: Yes Reason for Co-Treatment: Complexity of the patient's impairments (multi-system involvement);Necessary to address cognition/behavior during functional activity;For patient/therapist safety PT goals addressed during session: Mobility/safety with mobility;Balance OT goals addressed during session: ADL's and self-care      AM-PAC OT "6 Clicks" Daily Activity     Outcome Measure Help from another person eating meals?: A Lot Help from another person taking care of personal grooming?: A Lot Help from another person toileting, which includes using toliet, bedpan, or urinal?: Total Help from another person bathing (including washing, rinsing, drying)?: A Lot Help from another person to put on and taking off regular upper body clothing?: A Lot Help from another person to put on and taking off regular lower body clothing?: Total 6 Click Score: 10   End of Session Nurse Communication: Mobility status  Activity Tolerance: Patient tolerated treatment well Patient left: in bed;with call bell/phone within reach;with bed alarm set  OT Visit Diagnosis: Unsteadiness on feet (R26.81);Muscle weakness (generalized) (M62.81);History of falling (Z91.81);Other symptoms and signs involving cognitive function;Low vision, both eyes (H54.2);Hemiplegia and hemiparesis Hemiplegia - Right/Left: Right Hemiplegia - dominant/non-dominant: Dominant Hemiplegia - caused by:  (intracerebral hematoma)                Time: 4098-1191 OT Time Calculation (min): 39 min Charges:  OT General Charges $OT Visit: 1 Visit OT Evaluation $OT Eval Moderate Complexity: 1 Mod  April [name], OTR/L [facility] Acute  Rehabilitation Office: [phone]   April [name] 10/18/2023, 4:54 PM

## 2023-10-18 NOTE — Progress Notes (Signed)
Patient arrived on unit. Oriented to floor bed is locked on lowest position bed alarm on.  Telemetry initiated

## 2023-10-18 NOTE — Progress Notes (Addendum)
STROKE TEAM PROGRESS NOTE   BRIEF HPI [name] is a 67 y.o. female with history of GERD, hypertension, hyperlipidemia, COPD, marijuana use and smoking presenting with right-sided weakness and somnolence.  Patient was intubated prior to arrival at the hospital, and CT head revealed left basal ganglia ICH.  NIH on Admission 24   SIGNIFICANT HOSPITAL EVENTS 12/21-patient admitted with small left basal ganglia ICH, patient extubated 12/22-patient transferred out of the ICU  INTERIM HISTORY/SUBJECTIVE Patient has been hemodynamically stable and afebrile overnight.  Her home Klonopin was resumed overnight, and she became quite somnolent after receiving 2 doses.  Will hold off on Klonopin for now and restart at decreased dose if necessary.  Initially, she would only arouse to noxious stimuli, but on evaluation 2 hours later she was awake and alert but disoriented to place and situation and able to move all 4 extremities to command.  Will transfer out of the ICU today.  OBJECTIVE  CBC    Component Value Date/Time   WBC 9.7 10/18/2023 0306   RBC 4.88 10/18/2023 0306   HGB 13.4 10/18/2023 0306   HGB 15.7 (H) 08/15/2022 1320   HCT 43.6 10/18/2023 0306   HCT 41.2 11/12/2015 0559   PLT 143 (L) 10/18/2023 0306   PLT 184 08/15/2022 1320   MCV 89.3 10/18/2023 0306   MCH 27.5 10/18/2023 0306   MCHC 30.7 10/18/2023 0306   RDW 14.9 10/18/2023 0306   LYMPHSABS 1.2 10/16/2023 2145   MONOABS 0.5 10/16/2023 2145   EOSABS 0.1 10/16/2023 2145   BASOSABS 0.0 10/16/2023 2145    BMET    Component Value Date/Time   NA 141 10/18/2023 0306   K 3.9 10/18/2023 0306   CL 100 10/18/2023 0306   CO2 32 10/18/2023 0306   GLUCOSE 107 (H) 10/18/2023 0306   BUN <5 (L) 10/18/2023 0306   CREATININE 0.85 10/18/2023 0306   CREATININE 1.03 (H) 08/15/2022 1320   CALCIUM 8.5 (L) 10/18/2023 0306   GFRNONAA >60 10/18/2023 0306   GFRNONAA >60 08/15/2022 1320    IMAGING past 24 hours DG Chest Port 1  View Result Date: 10/18/2023 CLINICAL DATA:  Respiratory failure. EXAM: PORTABLE CHEST 1 VIEW COMPARISON:  10/17/2023 FINDINGS: Interval extubation and NG tube removal. Low volume film. The cardio pericardial silhouette is enlarged. There is pulmonary vascular congestion without overt pulmonary edema. No focal consolidation or pleural effusion. No acute bony abnormality. Telemetry leads overlie the chest. IMPRESSION: Interval extubation. Low volume film with pulmonary vascular congestion. Electronically Signed   By: Kennith Center M.D.   On: 10/18/2023 09:16   DG Shoulder Right Result Date: 10/17/2023 CLINICAL DATA:  Fall, pain. EXAM: RIGHT SHOULDER - 3 VIEW; RIGHT HUMERUS - 2 VIEW COMPARISON:  None Available. FINDINGS: There is no evidence of fracture or dislocation. Acromioclavicular degenerative change with narrowing and osteophyte formation. IMPRESSION: Acromioclavicular degenerative changes. No acute osseous abnormalities. Electronically Signed   By: Layla Maw M.D.   On: 10/17/2023 13:10   DG Humerus Right Result Date: 10/17/2023 CLINICAL DATA:  Fall, pain. EXAM: RIGHT SHOULDER - 3 VIEW; RIGHT HUMERUS - 2 VIEW COMPARISON:  None Available. FINDINGS: There is no evidence of fracture or dislocation. Acromioclavicular degenerative change with narrowing and osteophyte formation. IMPRESSION: Acromioclavicular degenerative changes. No acute osseous abnormalities. Electronically Signed   By: Layla Maw M.D.   On: 10/17/2023 13:10    Vitals:   10/18/23 1012 10/18/23 1030 10/18/23 1130 10/18/23 1145  BP:  138/62 (!) 100/52 116/65  Pulse:  65 63 63 60  Resp: (!) 26 (!) 26 (!) 27 (!) 26  Temp:      TempSrc:      SpO2: 100% 100% 100% 100%  Weight:      Height:         PHYSICAL EXAM General: Well-nourished, well-developed elderly lady, intubated in no acute distress Psych: Somewhat anxious but appropriate to situation CV: Regular rate and rhythm on monitor Respiratory: Respirations  regular and unlabored on room air   NEURO:  Mental Status: Oriented to self but discontinued oriented to place and situation, somewhat anxious Speech/Language: speech is without dysarthria or aphasia.    Cranial Nerves:  II: PERRL.  III, IV, VI: EOMI. Eyelids elevate symmetrically.  VII: Right facial droop VIII: hearing intact to voice. IX, X: Phonation is normal.  XII: tongue is midline without fasciculations. 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 HgbA1c No results found for requested labs within last 1095 days. VTE prophylaxis -SCDs clopidogrel 75 mg daily prior to admission, now on No antithrombotic secondary to ICH Therapy recommendations:  Pending Disposition: Pending  Hypertension Home meds: None Stable off nicardipine Blood Pressure Goal: SBP between 130-150 for 24 hours and then less than 160   Hyperlipidemia Home meds: Simvastatin 40 mg daily LDL No results found for requested labs within last 1095 days., goal < 70 Continue statin at discharge  Risk for diabetes type II  Home meds: None HgbA1c No results found for requested labs within last 1095 days., goal < 7.0  Tobacco Abuse Patient smokes 1  packs per day for 40 years Will discuss tobacco cessation and nicotine replacement when patient is extubated  Substance Abuse Patient uses marijuana UDS positive for  [facility]  Will discuss cessation when patient extubated  Dysphagia Patient has post-stroke dysphagia, SLP consulted    Diet   Diet regular Room service appropriate? Yes; Fluid consistency: Thin   Advance diet as tolerated  Respiratory failure Patient intubated prior to arrival due to somnolence Ventilator management per CCM Extubated 12/21  Other Stroke Risk Factors Obesity, Body mass index is 40.26 kg/m., BMI >/= 30 associated with increased stroke risk, recommend weight loss, diet and exercise as appropriate    Other Active Problems None  Hospital day # 2  Patient seen by NP with MD, MD to edit note is needed. Cortney E Ernestina Columbia , MSN, AGACNP-BC [facility] See Amion for schedule and pager information 10/18/2023 12:33 PM  I have personally obtained history,examined this patient, reviewed notes, independently viewed imaging studies, participated in medical decision making and plan of care.ROS completed by me personally and pertinent positives fully documented  I have made any additions or clarifications directly to the above note. Agree with note above.  Patient is neurologically stable and showing improvement.  Blood pressure adequately controlled.  Recommend mobilize out of bed.  Therapy consults.  Transfer out of ICU.  Ask medical hospitalist team to consult and assume care tomorrow.  No family available at the bedside today.  Greater than 50% time during this 50-minute visit was spent in counseling and coordination of care and discussion patient care team answering questions.  Delia Heady, MD Medical Director [facility] Pager: [phone] 10/18/2023 1:29 PM  To contact Stroke Continuity provider, please refer to [email or web address]. After hours, contact General Neurology

## 2023-10-19 ENCOUNTER — Encounter (HOSPITAL_COMMUNITY): Payer: Self-pay | Admitting: Neurology

## 2023-10-19 DIAGNOSIS — I619 Nontraumatic intracerebral hemorrhage, unspecified: Secondary | ICD-10-CM | POA: Diagnosis not present

## 2023-10-19 LAB — GLUCOSE, CAPILLARY
Glucose-Capillary: 89 mg/dL (ref 70–99)
Glucose-Capillary: 96 mg/dL (ref 70–99)
Glucose-Capillary: 102 mg/dL — ABNORMAL HIGH (ref 70–99)

## 2023-10-19 LAB — LIPID PANEL
Cholesterol: 119 mg/dL (ref 0–200)
HDL: 45 mg/dL (ref >40)
LDL Cholesterol: 52 mg/dL (ref 0–99)
Total CHOL/HDL Ratio: 2.6 {ratio}
Triglycerides: 110 mg/dL (ref <150)
VLDL: 22 mg/dL (ref 0–40)

## 2023-10-19 LAB — HEMOGLOBIN A1C
Hgb A1c MFr Bld: 5.6 % (ref 4.8–5.6)
Mean Plasma Glucose: 114.02 mg/dL

## 2023-10-19 MED ORDER — ONDANSETRON 4 MG PO TBDP
4.0000 mg | ORAL_TABLET | Freq: Three times a day (TID) | ORAL | Status: DC | PRN
  Administered 2023-10-19 – 2023-10-23 (×4): 4 mg via ORAL
  Filled 2023-10-19 (×5): qty 1
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
## 2023-10-19 NOTE — Plan of Care (Signed)
  Problem: Education: Goal: Knowledge of General Education information will improve Description: Including pain rating scale, medication(s)/side effects and non-pharmacologic comfort measures Outcome: Progressing   Problem: Health Behavior/Discharge Planning: Goal: Ability to manage health-related needs will improve Outcome: Progressing   Problem: Clinical Measurements: Goal: Ability to maintain clinical measurements within normal limits will improve Outcome: Progressing Goal: Will remain free from infection Outcome: Progressing Goal: Diagnostic test results will improve Outcome: Progressing Goal: Respiratory complications will improve Outcome: Progressing Goal: Cardiovascular complication will be avoided Outcome: Progressing   Problem: Activity: Goal: Risk for activity intolerance will decrease Outcome: Progressing   Problem: Nutrition: Goal: Adequate nutrition will be maintained Outcome: Progressing   Problem: Coping: Goal: Level of anxiety will decrease Outcome: Progressing   Problem: Elimination: Goal: Will not experience complications related to bowel motility Outcome: Progressing Goal: Will not experience complications related to urinary retention Outcome: Progressing   Problem: Pain Management: Goal: General experience of comfort will improve Outcome: Progressing   Problem: Safety: Goal: Ability to remain free from injury will improve Outcome: Progressing   Problem: Skin Integrity: Goal: Risk for impaired skin integrity will decrease Outcome: Progressing   Problem: Education: Goal: Knowledge of disease or condition will improve Outcome: Progressing Goal: Knowledge of secondary prevention will improve (MUST DOCUMENT ALL) Outcome: Progressing Goal: Knowledge of patient specific risk factors will improve Loraine Leriche N/A or DELETE if not current risk factor) Outcome: Progressing   Problem: Intracerebral Hemorrhage Tissue Perfusion: Goal: Complications of  Intracerebral Hemorrhage will be minimized Outcome: Progressing   Problem: Coping: Goal: Will verbalize positive feelings about self Outcome: Progressing Goal: Will identify appropriate support needs Outcome: Progressing   Problem: Health Behavior/Discharge Planning: Goal: Ability to manage health-related needs will improve Outcome: Progressing Goal: Goals will be collaboratively established with patient/family Outcome: Progressing   Problem: Self-Care: Goal: Ability to participate in self-care as condition permits will improve Outcome: Progressing Goal: Verbalization of feelings and concerns over difficulty with self-care will improve Outcome: Progressing Goal: Ability to communicate needs accurately will improve Outcome: Progressing   Problem: Nutrition: Goal: Risk of aspiration will decrease Outcome: Progressing Goal: Dietary intake will improve Outcome: Progressing   Problem: Education: Goal: Ability to describe self-care measures that may prevent or decrease complications (Diabetes Survival Skills Education) will improve Outcome: Progressing Goal: Individualized Educational Video(s) Outcome: Progressing   Problem: Coping: Goal: Ability to adjust to condition or change in health will improve Outcome: Progressing   Problem: Fluid Volume: Goal: Ability to maintain a balanced intake and output will improve Outcome: Progressing   Problem: Health Behavior/Discharge Planning: Goal: Ability to identify and utilize available resources and services will improve Outcome: Progressing Goal: Ability to manage health-related needs will improve Outcome: Progressing   Problem: Nutritional: Goal: Maintenance of adequate nutrition will improve Outcome: Progressing Goal: Progress toward achieving an optimal weight will improve Outcome: Progressing   Problem: Skin Integrity: Goal: Risk for impaired skin integrity will decrease Outcome: Progressing   Problem: Tissue  Perfusion: Goal: Adequacy of tissue perfusion will improve Outcome: Progressing   Problem: Activity: Goal: Ability to tolerate increased activity will improve Outcome: Progressing   Problem: Respiratory: Goal: Ability to maintain a clear airway and adequate ventilation will improve Outcome: Progressing   Problem: Role Relationship: Goal: Method of communication will improve Outcome: Progressing

## 2023-10-19 NOTE — Progress Notes (Signed)
Per NP, April [name], pt can now be NIH q4 stroke scale.

## 2023-10-19 NOTE — Progress Notes (Signed)
STROKE TEAM PROGRESS NOTE   BRIEF HPI [name] is a 67 y.o. female with history of GERD, hypertension, hyperlipidemia, COPD, marijuana use and smoking presenting with right-sided weakness and somnolence.  Patient was intubated prior to arrival at the hospital, and CT head revealed left basal ganglia ICH.  NIH on Admission 24   SIGNIFICANT HOSPITAL EVENTS 12/21-patient admitted with small left basal ganglia ICH, patient extubated 12/22-patient transferred out of the ICU  INTERIM HISTORY/SUBJECTIVE Patient has been transferred to the floor.  She is sitting up in bed.  She is alert and interactive.  She has mild right hemiparesis.  Blood pressure adequately controlled.  She wants her Klonopin dose to be restarted.  Vital signs stable.  OBJECTIVE  CBC    Component Value Date/Time   WBC 9.7 10/18/2023 0306   RBC 4.88 10/18/2023 0306   HGB 13.4 10/18/2023 0306   HGB 15.7 (H) 08/15/2022 1320   HCT 43.6 10/18/2023 0306   HCT 41.2 11/12/2015 0559   PLT 143 (L) 10/18/2023 0306   PLT 184 08/15/2022 1320   MCV 89.3 10/18/2023 0306   MCH 27.5 10/18/2023 0306   MCHC 30.7 10/18/2023 0306   RDW 14.9 10/18/2023 0306   LYMPHSABS 1.2 10/16/2023 2145   MONOABS 0.5 10/16/2023 2145   EOSABS 0.1 10/16/2023 2145   BASOSABS 0.0 10/16/2023 2145    BMET    Component Value Date/Time   NA 141 10/18/2023 0306   K 3.9 10/18/2023 0306   CL 100 10/18/2023 0306   CO2 32 10/18/2023 0306   GLUCOSE 107 (H) 10/18/2023 0306   BUN <5 (L) 10/18/2023 0306   CREATININE 0.85 10/18/2023 0306   CREATININE 1.03 (H) 08/15/2022 1320   CALCIUM 8.5 (L) 10/18/2023 0306   GFRNONAA >60 10/18/2023 0306   GFRNONAA >60 08/15/2022 1320    IMAGING past 24 hours ECHOCARDIOGRAM COMPLETE Result Date: 10/18/2023    ECHOCARDIOGRAM REPORT   Patient Name:   April [name] Date of Exam: 10/18/2023 Medical Rec #:  [id number]     Height:       63.0 in Accession #:    [id number]    Weight:       227.3 lb Date of Birth:   July 25, 1956    BSA:          2.042 m Patient Age:    67 years      BP:           108/48 mmHg Patient Gender: F             HR:           65 bpm. Exam Location:  Inpatient Procedure: 2D Echo, Cardiac Doppler and Color Doppler Indications:    Stroke l63.9  History:        Patient has prior history of Echocardiogram examinations, most                 recent 07/04/2022. CHF, TIA, COPD and Stroke; Risk                 Factors:Dyslipidemia and Hypertension. Ductal carcinoma in situ                 (DCIS) of right breast, Tobacco abuse.  Sonographer:    Celesta Gentile RCS Referring Phys: [id number] [facility] IMPRESSIONS  1. Left ventricular ejection fraction, by estimation, is 65 to 70%. The left ventricle has normal function. The left ventricle has no regional wall motion abnormalities. There is mild left  ventricular hypertrophy. Left ventricular diastolic parameters are consistent with Grade II diastolic dysfunction (pseudonormalization). Elevated left atrial pressure.  2. Right ventricular systolic function is normal. The right ventricular size is normal. Tricuspid regurgitation signal is inadequate for assessing PA pressure.  3. Left atrial size was moderately dilated.  4. The mitral valve is normal in structure. Trivial mitral valve regurgitation.  5. The aortic valve was not well visualized. Aortic valve regurgitation is not visualized. No aortic stenosis is present.  6. The inferior vena cava is dilated in size with >50% respiratory variability, suggesting right atrial pressure of 8 mmHg. FINDINGS  Left Ventricle: Left ventricular ejection fraction, by estimation, is 65 to 70%. The left ventricle has normal function. The left ventricle has no regional wall motion abnormalities. The left ventricular internal cavity size was normal in size. There is  mild left ventricular hypertrophy. Left ventricular diastolic parameters are consistent with Grade II diastolic dysfunction (pseudonormalization). Elevated left atrial  pressure. Right Ventricle: The right ventricular size is normal. No increase in right ventricular wall thickness. Right ventricular systolic function is normal. Tricuspid regurgitation signal is inadequate for assessing PA pressure. Left Atrium: Left atrial size was moderately dilated. Right Atrium: Right atrial size was normal in size. Pericardium: Trivial pericardial effusion is present. Presence of epicardial fat layer. Mitral Valve: The mitral valve is normal in structure. Trivial mitral valve regurgitation. Tricuspid Valve: The tricuspid valve is normal in structure. Tricuspid valve regurgitation is trivial. Aortic Valve: The aortic valve was not well visualized. Aortic valve regurgitation is not visualized. No aortic stenosis is present. Aortic valve mean gradient measures 8.0 mmHg. Aortic valve peak gradient measures 13.1 mmHg. Aortic valve area, by VTI measures 1.86 cm. Pulmonic Valve: The pulmonic valve was not well visualized. Pulmonic valve regurgitation is trivial. Aorta: The aortic root is normal in size and structure. Venous: The inferior vena cava is dilated in size with greater than 50% respiratory variability, suggesting right atrial pressure of 8 mmHg. IAS/Shunts: The interatrial septum was not well visualized.  LEFT VENTRICLE PLAX 2D LVIDd:         5.00 cm   Diastology LVIDs:         3.40 cm   LV e' medial:    7.40 cm/s LV PW:         1.30 cm   LV E/e' medial:  15.5 LV IVS:        1.10 cm   LV e' lateral:   9.36 cm/s LVOT diam:     1.70 cm   LV E/e' lateral: 12.3 LV SV:         77 LV SV Index:   38 LVOT Area:     2.27 cm  RIGHT VENTRICLE RV S prime:     12.90 cm/s TAPSE (M-mode): 2.4 cm LEFT ATRIUM             Index        RIGHT ATRIUM           Index LA diam:        3.00 cm 1.47 cm/m   RA Area:     16.70 cm LA Vol (A2C):   87.7 ml 42.95 ml/m  RA Volume:   47.10 ml  23.07 ml/m LA Vol (A4C):   94.7 ml 46.38 ml/m LA Biplane Vol: 93.9 ml 45.99 ml/m  AORTIC VALVE AV Area (Vmax):    1.88 cm AV  Area (Vmean):   1.82 cm AV Area (VTI):  1.86 cm AV Vmax:           181.00 cm/s AV Vmean:          127.000 cm/s AV VTI:            0.415 m AV Peak Grad:      13.1 mmHg AV Mean Grad:      8.0 mmHg LVOT Vmax:         150.00 cm/s LVOT Vmean:        102.000 cm/s LVOT VTI:          0.340 m LVOT/AV VTI ratio: 0.82  AORTA Ao Root diam: 3.00 cm MITRAL VALVE MV Area (PHT): 2.47 cm     SHUNTS MV E velocity: 115.00 cm/s  Systemic VTI:  0.34 m MV A velocity: 123.50 cm/s  Systemic Diam: 1.70 cm MV E/A ratio:  0.93 Epifanio Lesches MD Electronically signed by Epifanio Lesches MD Signature Date/Time: 10/18/2023/2:40:50 PM    Final     Vitals:   10/19/23 0320 10/19/23 0757 10/19/23 0806 10/19/23 1134  BP: (!) 157/78  (!) 142/71 124/68  Pulse: 79  77 74  Resp: 18     Temp: 98 F (36.7 C)  97.6 F (36.4 C) 99 F (37.2 C)  TempSrc: Oral  Axillary Oral  SpO2: 91% 93% 96% 97%  Weight:      Height:         PHYSICAL EXAM General: Well-nourished, well-developed elderly lady, not in distress psych: Somewhat anxious but appropriate to situation CV: Regular rate and rhythm on monitor Respiratory: Respirations regular and unlabored on room air   NEURO:  Mental Status: Oriented to time, place and situation, somewhat anxious Speech/Language: speech is without dysarthria or aphasia.    Cranial Nerves:  II: PERRL.  III, IV, VI: EOMI. Eyelids elevate symmetrically.  VII: Right facial droop VIII: hearing intact to voice. IX, X: Phonation is normal.  XII: tongue is midline without fasciculations. Motor: Able to move all 4 extremities with good antigravity strength mild weakness of right grip and intrinsic hand muscles.  Right grip is weak compared to left Tone: is normal and bulk is normal Sensation- Intact to noxious stimuli bilaterally Gait- deferred   ASSESSMENT/PLAN  Intracerebral Hemorrhage:  left basal ganglia ICH Etiology: Likely hypertensive  CT head left basal ganglia IPH CTA head &  neck chronic occlusion of right ICA with reconstitution up at the ophthalmic segment, chronic [place] occlusion of proximal left ICA with weak unchanged distal reconstitution, patent vertebral artery stents Repeat CT head 12/21 at -140: Slight enlargement of left basal ganglia ICH MRI  no mass or infarct seen underlying ICH, chronic small vessel disease and remote right frontal parietal infarcts MRV no acute abnormalities 2D Echo ejection fraction 65 to 70%.  Left atrium moderately dilated LDL 52 mg percent HgbA1c 5.6 VTE prophylaxis -SCDs clopidogrel 75 mg daily prior to admission, now on No antithrombotic secondary to ICH Therapy recommendations:  Pending Disposition: Pending  Hypertension Home meds: None Stable off nicardipine Blood Pressure Goal: SBP between 130-150 for 24 hours and then less than 160   Hyperlipidemia Home meds: Simvastatin 40 mg daily LDL No results found for requested labs within last 1095 days., goal < 70 Continue statin at discharge  Risk for diabetes type II  Home meds: None HgbA1c No results found for requested labs within last 1095 days., goal < 7.0  Tobacco Abuse Patient smokes 1 packs per day for 40 years Will discuss tobacco cessation and nicotine  replacement when patient is extubated  Substance Abuse Patient uses marijuana UDS positive for  [facility]  Will discuss cessation when patient extubated  Dysphagia Patient has post-stroke dysphagia, SLP consulted    Diet   Diet regular Room service appropriate? Yes; Fluid consistency: Thin   Advance diet as tolerated  Respiratory failure Patient intubated prior to arrival due to somnolence Ventilator management per CCM Extubated 12/21  Other Stroke Risk Factors Obesity, Body mass index is 40.26 kg/m., BMI >/= 30 associated with increased stroke risk, recommend weight loss, diet and exercise as appropriate    Other Active Problems None  Hospital day # 3   Patient is neurologically stable and  showing improvement.  Blood pressure adequately controlled.  Continue mobilization out of bed and ongoing therapy consults.  Ask medical hospitalist team to consult and assume care tomorrow.  No family available at the bedside today.   Discussed with Dr. Lanae Boast.  Greater than 50% time during this 35-minute visit was spent in counseling and coordination of care discussion patient and care team answering questions.  Delia Heady, MD Medical Director [facility] Pager: [phone] 10/19/2023 12:21 PM  To contact Stroke Continuity provider, please refer to [email or web address]. After hours, contact General Neurology

## 2023-10-19 NOTE — Hospital Course (Addendum)
67 yof found on bathroom floor w/ right arm bent behind her reportedly she passed out and was laying on floor called her neighbor for help and EMS was called.she denied hitting head but had bruising on right arm. Later in ER reported right arm & leg  weakness since about 4pm on 20th,   CT showed left BG subcortical hemorrhage Prior to transport to [facility] became more somnolent and was intubated for airway protection. F/u CT head showed " Intraparenchymal hematoma again noted in the left basal ganglia measuring 2.3 x 2.3 x 2.2 cm compared to 2.3 x 2.3 x 1.8 cm previously".She was admitted by Neurology for likely nontraumatic left BG hemorrhage, UDS positive for THC, mental status decline in 12/21 intubated prior to transport, repeat CT head with minimal changes but some amount of surrounding edema.  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 12/21 patient extubated 12/23 transferred to floor TRH consulted> PT OT has recommended skilled nursing facility and awaiting for placement. 10/24/2023 The patient has been approved for CIR. Anticipate discharge on 10/26/2023.

## 2023-10-19 NOTE — Plan of Care (Signed)
  Problem: Education: Goal: Knowledge of General Education information will improve Description: Including pain rating scale, medication(s)/side effects and non-pharmacologic comfort measures Outcome: Progressing   Problem: Nutrition: Goal: Adequate nutrition will be maintained Outcome: Progressing   Problem: Activity: Goal: Risk for activity intolerance will decrease Outcome: Progressing   Problem: Coping: Goal: Level of anxiety will decrease Outcome: Progressing   Problem: Skin Integrity: Goal: Risk for impaired skin integrity will decrease Outcome: Progressing

## 2023-10-19 NOTE — Care Management Important Message (Signed)
Important Message  Patient Details  Name: April [name] MRN: [id number] Date of Birth: Oct 29, 1955   Important Message Given:  Yes - Medicare IM     [name] 10/19/2023, 2:45 PM
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
## 2023-10-19 NOTE — Progress Notes (Addendum)
Pt does not want to d/c to [facility]. Family does not want pt to d/c to [facility] either. Case manager and social worker made aware.

## 2023-10-19 NOTE — Plan of Care (Signed)
  Problem: Education: Goal: Knowledge of General Education information will improve Description: Including pain rating scale, medication(s)/side effects and non-pharmacologic comfort measures Outcome: Not Progressing   Problem: Health Behavior/Discharge Planning: Goal: Ability to manage health-related needs will improve Outcome: Not Progressing   Problem: Clinical Measurements: Goal: Ability to maintain clinical measurements within normal limits will improve Outcome: Progressing Goal: Will remain free from infection Outcome: Progressing Goal: Diagnostic test results will improve Outcome: Progressing Goal: Respiratory complications will improve Outcome: Progressing Goal: Cardiovascular complication will be avoided Outcome: Progressing   Problem: Activity: Goal: Risk for activity intolerance will decrease Outcome: Progressing   Problem: Nutrition: Goal: Adequate nutrition will be maintained Outcome: Progressing   Problem: Coping: Goal: Level of anxiety will decrease Outcome: Progressing   Problem: Elimination: Goal: Will not experience complications related to bowel motility Outcome: Progressing Goal: Will not experience complications related to urinary retention Outcome: Progressing   Problem: Pain Management: Goal: General experience of comfort will improve Outcome: Progressing   Problem: Safety: Goal: Ability to remain free from injury will improve Outcome: Progressing   Problem: Skin Integrity: Goal: Risk for impaired skin integrity will decrease Outcome: Progressing   Problem: Education: Goal: Knowledge of disease or condition will improve Outcome: Progressing Goal: Knowledge of secondary prevention will improve (MUST DOCUMENT ALL) Outcome: Progressing Goal: Knowledge of patient specific risk factors will improve Loraine Leriche N/A or DELETE if not current risk factor) Outcome: Progressing   Problem: Intracerebral Hemorrhage Tissue Perfusion: Goal: Complications  of Intracerebral Hemorrhage will be minimized Outcome: Progressing   Problem: Coping: Goal: Will verbalize positive feelings about self Outcome: Progressing Goal: Will identify appropriate support needs Outcome: Progressing   Problem: Health Behavior/Discharge Planning: Goal: Ability to manage health-related needs will improve Outcome: Progressing Goal: Goals will be collaboratively established with patient/family Outcome: Progressing   Problem: Self-Care: Goal: Ability to participate in self-care as condition permits will improve Outcome: Progressing Goal: Verbalization of feelings and concerns over difficulty with self-care will improve Outcome: Progressing Goal: Ability to communicate needs accurately will improve Outcome: Progressing   Problem: Nutrition: Goal: Risk of aspiration will decrease Outcome: Progressing Goal: Dietary intake will improve Outcome: Progressing   Problem: Education: Goal: Ability to describe self-care measures that may prevent or decrease complications (Diabetes Survival Skills Education) will improve Outcome: Not Progressing   Problem: Coping: Goal: Ability to adjust to condition or change in health will improve Outcome: Progressing   Problem: Fluid Volume: Goal: Ability to maintain a balanced intake and output will improve Outcome: Progressing   Problem: Health Behavior/Discharge Planning: Goal: Ability to identify and utilize available resources and services will improve Outcome: Progressing Goal: Ability to manage health-related needs will improve Outcome: Not Progressing   Problem: Metabolic: Goal: Ability to maintain appropriate glucose levels will improve Outcome: Completed/Met   Problem: Nutritional: Goal: Maintenance of adequate nutrition will improve Outcome: Progressing Goal: Progress toward achieving an optimal weight will improve Outcome: Progressing   Problem: Skin Integrity: Goal: Risk for impaired skin integrity  will decrease Outcome: Progressing   Problem: Tissue Perfusion: Goal: Adequacy of tissue perfusion will improve Outcome: Progressing   Problem: Activity: Goal: Ability to tolerate increased activity will improve Outcome: Progressing   Problem: Respiratory: Goal: Ability to maintain a clear airway and adequate ventilation will improve Outcome: Progressing   Problem: Role Relationship: Goal: Method of communication will improve Outcome: Progressing

## 2023-10-19 NOTE — Care Management Important Message (Signed)
Important Message  Patient Details  Name: April [name] MRN: [id number] Date of Birth: [birth year]   Important Message Given:        [name] 10/19/2023, 2:46 PM

## 2023-10-19 NOTE — Progress Notes (Signed)
SLP Cancellation Note  Patient Details Name: April [name] MRN: [id number] DOB: [birth year]   Cancelled treatment:       Reason Eval/Treat Not Completed: Fatigue/lethargy limiting ability to participate   Pat Milika Ventress,M.S.,CCC-SLP 10/19/2023, 9:50 AM

## 2023-10-19 NOTE — Progress Notes (Signed)
PROGRESS NOTE Summerlynn Morehart  ZOX:[id number] DOB: [birth year] DOA: 10/16/2023 PCP: Barbie Banner, MD  Brief Narrative/Hospital Course: 67 yof found on bathroom floor w/ right arm bent behind her reportedly she passed out and was laying on floor called her neighbor for help and EMS was called.she denied hitting head but had bruising on right arm. Later in ER reported right arm & leg  weakness since about 4pm on 20th,   CT showed left BG subcortical hemorrhage Prior to transport to [facility] became more somnolent and was intubated for airway protection. F/u CT head showed " Intraparenchymal hematoma again noted in the left basal ganglia measuring 2.3 x 2.3 x 2.2 cm compared to 2.3 x 2.3 x 1.8 cm previously".She was admitted by Neurology for likely nontraumatic left BG hemorrhage, UDS positive for THC, mental status decline in 12/21 intubated prior to transport, repeat CT head with minimal changes but some amount of surrounding edema.  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 12/21 patient extubated 12/23 transferred to floor TRH consulted   Subjective: Patient seen and examined this morning alert awake oriented complains of some weakness on the left side On 3 to nasal cannula No acute events overnight  Assessment and Plan: Principal Problem:   Hemorrhagic stroke [facility]) Active Problems:   ICH (intracerebral hemorrhage) (HCC)   Intracerebral hemorrhage left basal ganglia ICH: Etiology likely hypertensive.  Patient completed extensive workup with CT head CT head and neck repeat CT head 12/21, MRI MRV-no acute finding other than chronic issues as mentioned above. 2D echo-EF 65-70%, G2 DD, mitral valve normal aortic valve not well-visualized LDL 52 at goal, A1c pending PTA on Plavix> discontinued now due to ICH. Some weakness on the left side, continue PT OT  and disposition per neurology  Hypertension HFpEF: PTA Lasix 20 mg.Off nicardipine. BPstable at goal 130-150 w/o meds.  Volume status stable  Prior CVA on Plavix Hyperlipidemia: LDL at goal. PTA on simvastatin. Plavix discontinued as above.  Somnolence: Likely from Klonopin.  Currently alert awake oriented.  Caution with psychotropic medication  Acute respiratory failure: COPD: PTA on Trelegy.needing intubation in the setting of ICH successfully extubated 12/21, on nasal cannula 3l Continue Pulmicort/Yupelri/Brovana, prn nebs, IS wean o2 as able, not wheezing.  Tobacco abuse Substance abuse UDS positive for THC: Cessation counseling  Depression: Mood stable continue Effexor and trazodone.  Klonopin held 2/2 somnolence-consider resuming  Dysphagia: SLP to continue  Morbid obesity: Patient's Body mass index is 40.26 kg/m. : Will benefit with PCP follow-up, weight loss  healthy lifestyle and outpatient sleep evaluation.   DVT prophylaxis: enoxaparin (LOVENOX) injection 40 mg Start: 10/18/23 2200 SCD's Start: 10/17/23 8119 Code Status:   Code Status: Limited: Do not attempt resuscitation (DNR) -DNR-LIMITED -Do Not Intubate/DNI  Family Communication: plan of care discussed with patien at bedside. Patient status is: Remains hospitalized because of severity of illness Level of care: Telemetry Medical   Dispo: The patient is from: home lives independently            Anticipated disposition: Skilled nursing facility per neurology  Objective: Vitals last 24 hrs: Vitals:   10/18/23 2328 10/19/23 0320 10/19/23 0757 10/19/23 0806  BP: (!) 150/68 (!) 157/78  (!) 142/71  Pulse: 66 79  77  Resp: 18 18    Temp: 97.8 F (36.6 C) 98 F (36.7 C)  97.6 F (36.4 C)  TempSrc: Oral  Oral  Axillary  SpO2: 98% 91% 93% 96%  Weight:      Height:       Weight change:   Physical Examination: General exam: alert awake,at baseline, 67 older than stated age HEENT:Oral mucosa moist, Ear/Nose  WNL grossly Respiratory system: Bilaterally clear BS,no use of accessory muscle Cardiovascular system: S1 & S2 +, No JVD. Gastrointestinal system: Abdomen soft,NT,ND, BS+ Nervous System: Alert, awake, moving all extremities w/ mild use of the left side Extremities: LE edema neg,distal peripheral pulses palpable and warm.  Skin: No rashes,no icterus. MSK: Normal muscle bulk,tone, power   Medications reviewed:  Scheduled Meds:  arformoterol  15 mcg Nebulization BID   budesonide (PULMICORT) nebulizer solution  0.5 mg Nebulization BID   enoxaparin (LOVENOX) injection  40 mg Subcutaneous QHS   famotidine  20 mg Oral BID   insulin aspart  0-6 Units Subcutaneous Q4H   nicotine  14 mg Transdermal Daily   revefenacin  175 mcg Nebulization Daily   rosuvastatin  10 mg Oral Daily   senna-docusate  1 tablet Oral BID   traZODone  300 mg Oral QHS   venlafaxine XR  150 mg Oral Q breakfast   Continuous Infusions:    Diet Order             Diet regular Room service appropriate? Yes; Fluid consistency: Thin  Diet effective now                   Intake/Output Summary (Last 24 hours) at 10/19/2023 0842 Last data filed at 10/19/2023 0800 Gross per 24 hour  Intake 360 ml  Output 1350 ml  Net -990 ml   Net IO Since Admission: -1,654.57 mL [10/19/23 0842]  Wt Readings from Last 3 Encounters:  10/17/23 103.1 kg  03/05/23 109.2 kg  12/01/22 105.2 kg     Unresulted Labs (From admission, onward)     Start     Ordered   10/18/23 0900  Hemoglobin A1c  Once,   AD        10/18/23 0900   10/17/23 0307  HIV Antibody (routine testing w rflx)  (HIV Antibody (Routine testing w reflex) panel)  Once,   R        10/17/23 0309          Data Reviewed: I have personally reviewed following labs and imaging studies CBC: Recent Labs  Lab 10/16/23 2145 10/17/23 0513 10/17/23 0536 10/18/23 0306  WBC 9.7 9.9  --  9.7  NEUTROABS 7.8*  --   --   --   HGB 15.3* 14.0 14.3 13.4  HCT 49.7* 45.0  42.0 43.6  MCV 90.5 88.2  --  89.3  PLT 174 167  --  143*   Basic Metabolic Panel:  Recent Labs  Lab 10/16/23 2145 10/17/23 0513 10/17/23 0536 10/18/23 0306  NA 138 142 139 141  K 3.7 3.5 3.4* 3.9  CL 96* 103  --  100  CO2 33* 31  --  32  GLUCOSE 110* 103*  --  107*  BUN 10 7*  --  <5*  CREATININE 0.90 1.02*  --  0.85  CALCIUM 9.1 8.5*  --  8.5*   GFR: Estimated Creatinine Clearance: 75.7 mL/min (by C-G formula based on SCr of 0.85 mg/dL). Liver Function Tests:  Recent Labs  Lab 10/16/23 2145 10/17/23 0513 10/18/23 0306  AST 13* 14* 13*  ALT 12 9 10   ALKPHOS 35* 32* 31*  BILITOT 0.5 0.6 0.6  PROT 6.8  5.6* 5.7*  ALBUMIN 3.4* 2.8* 2.8*   No results for input(s): "LIPASE", "AMYLASE" in the last 168 hours. No results for input(s): "AMMONIA" in the last 168 hours. Coagulation Profile:  Recent Labs  Lab 10/16/23 2145  INR 1.0   No results for input(s): "PROBNP" in the last 168 hours.  No results for input(s): "HGBA1C" in the last 72 hours. Recent Labs  Lab 10/18/23 1532 10/18/23 1944 10/18/23 2328 10/19/23 0318 10/19/23 0808  GLUCAP 101* 102* 98 89 96   Recent Labs    10/18/23 0306  CHOL 119  HDL 45  LDLCALC 52  TRIG 110  107  CHOLHDL 2.6   No results for input(s): "TSH", "T4TOTAL", "FREET4", "T3FREE", "THYROIDAB" in the last 72 hours. Sepsis Labs: No results for input(s): "PROCALCITON", "LATICACIDVEN" in the last 168 hours. Recent Results (from the past 240 hours)  MRSA Next Gen by PCR, Nasal     Status: None   Collection Time: 10/17/23  3:26 AM   Specimen: Nasal Mucosa; Nasal Swab  Result Value Ref Range Status   MRSA by PCR Next Gen NOT DETECTED NOT DETECTED Final    Comment: (NOTE) The GeneXpert MRSA Assay (FDA approved for NASAL specimens only), is one component of a comprehensive MRSA colonization surveillance program. It is not intended to diagnose MRSA infection nor to guide or monitor treatment for MRSA infections. Test performance is not  FDA approved in patients less than 66 years old. Performed at [facility], [address] [place], [place] [address]     Antimicrobials/Microbiology: Anti-infectives (From admission, onward)    None      No results found for: "SDES", "SPECREQUEST", "CULT", "REPTSTATUS"   Radiology Studies: ECHOCARDIOGRAM COMPLETE Result Date: 10/18/2023    ECHOCARDIOGRAM REPORT   Patient Name:   CORALIA STECKLEIN Date of Exam: 10/18/2023 Medical Rec #:  [id number]     Height:       63.0 in Accession #:    [id number]    Weight:       227.3 lb Date of Birth:  10-01-1956    BSA:          2.042 m Patient Age:    67 years      BP:           108/48 mmHg Patient Gender: F             HR:           65 bpm. Exam Location:  Inpatient Procedure: 2D Echo, Cardiac Doppler and Color Doppler Indications:    Stroke l63.9  History:        Patient has prior history of Echocardiogram examinations, most                 recent 07/04/2022. CHF, TIA, COPD and Stroke; Risk                 Factors:Dyslipidemia and Hypertension. Ductal carcinoma in situ                 (DCIS) of right breast, Tobacco abuse.  Sonographer:    Celesta Gentile RCS Referring Phys: [id number] [facility] IMPRESSIONS  1. Left ventricular ejection fraction, by estimation, is 65 to 70%. The left ventricle has normal function. The left ventricle has no regional wall motion abnormalities. There is mild left ventricular hypertrophy. Left ventricular diastolic parameters are consistent with Grade II diastolic dysfunction (pseudonormalization). Elevated left atrial pressure.  2. Right ventricular systolic function is normal. The  right ventricular size is normal. Tricuspid regurgitation signal is inadequate for assessing PA pressure.  3. Left atrial size was moderately dilated.  4. The mitral valve is normal in structure. Trivial mitral valve regurgitation.  5. The aortic valve was not well visualized. Aortic valve regurgitation is not visualized. No aortic stenosis is  present.  6. The inferior vena cava is dilated in size with >50% respiratory variability, suggesting right atrial pressure of 8 mmHg. FINDINGS  Left Ventricle: Left ventricular ejection fraction, by estimation, is 65 to 70%. The left ventricle has normal function. The left ventricle has no regional wall motion abnormalities. The left ventricular internal cavity size was normal in size. There is  mild left ventricular hypertrophy. Left ventricular diastolic parameters are consistent with Grade II diastolic dysfunction (pseudonormalization). Elevated left atrial pressure. Right Ventricle: The right ventricular size is normal. No increase in right ventricular wall thickness. Right ventricular systolic function is normal. Tricuspid regurgitation signal is inadequate for assessing PA pressure. Left Atrium: Left atrial size was moderately dilated. Right Atrium: Right atrial size was normal in size. Pericardium: Trivial pericardial effusion is present. Presence of epicardial fat layer. Mitral Valve: The mitral valve is normal in structure. Trivial mitral valve regurgitation. Tricuspid Valve: The tricuspid valve is normal in structure. Tricuspid valve regurgitation is trivial. Aortic Valve: The aortic valve was not well visualized. Aortic valve regurgitation is not visualized. No aortic stenosis is present. Aortic valve mean gradient measures 8.0 mmHg. Aortic valve peak gradient measures 13.1 mmHg. Aortic valve area, by VTI measures 1.86 cm. Pulmonic Valve: The pulmonic valve was not well visualized. Pulmonic valve regurgitation is trivial. Aorta: The aortic root is normal in size and structure. Venous: The inferior vena cava is dilated in size with greater than 50% respiratory variability, suggesting right atrial pressure of 8 mmHg. IAS/Shunts: The interatrial septum was not well visualized.  LEFT VENTRICLE PLAX 2D LVIDd:         5.00 cm   Diastology LVIDs:         3.40 cm   LV e' medial:    7.40 cm/s LV PW:         1.30  cm   LV E/e' medial:  15.5 LV IVS:        1.10 cm   LV e' lateral:   9.36 cm/s LVOT diam:     1.70 cm   LV E/e' lateral: 12.3 LV SV:         77 LV SV Index:   38 LVOT Area:     2.27 cm  RIGHT VENTRICLE RV S prime:     12.90 cm/s TAPSE (M-mode): 2.4 cm LEFT ATRIUM             Index        RIGHT ATRIUM           Index LA diam:        3.00 cm 1.47 cm/m   RA Area:     16.70 cm LA Vol (A2C):   87.7 ml 42.95 ml/m  RA Volume:   47.10 ml  23.07 ml/m LA Vol (A4C):   94.7 ml 46.38 ml/m LA Biplane Vol: 93.9 ml 45.99 ml/m  AORTIC VALVE AV Area (Vmax):    1.88 cm AV Area (Vmean):   1.82 cm AV Area (VTI):     1.86 cm AV Vmax:           181.00 cm/s AV Vmean:  127.000 cm/s AV VTI:            0.415 m AV Peak Grad:      13.1 mmHg AV Mean Grad:      8.0 mmHg LVOT Vmax:         150.00 cm/s LVOT Vmean:        102.000 cm/s LVOT VTI:          0.340 m LVOT/AV VTI ratio: 0.82  AORTA Ao Root diam: 3.00 cm MITRAL VALVE MV Area (PHT): 2.47 cm     SHUNTS MV E velocity: 115.00 cm/s  Systemic VTI:  0.34 m MV A velocity: 123.50 cm/s  Systemic Diam: 1.70 cm MV E/A ratio:  0.93 Epifanio Lesches MD Electronically signed by Epifanio Lesches MD Signature Date/Time: 10/18/2023/2:40:50 PM    Final    DG Chest Port 1 View Result Date: 10/18/2023 CLINICAL DATA:  Respiratory failure. EXAM: PORTABLE CHEST 1 VIEW COMPARISON:  10/17/2023 FINDINGS: Interval extubation and NG tube removal. Low volume film. The cardio pericardial silhouette is enlarged. There is pulmonary vascular congestion without overt pulmonary edema. No focal consolidation or pleural effusion. No acute bony abnormality. Telemetry leads overlie the chest. IMPRESSION: Interval extubation. Low volume film with pulmonary vascular congestion. Electronically Signed   By: Kennith Center M.D.   On: 10/18/2023 09:16   DG Shoulder Right Result Date: 10/17/2023 CLINICAL DATA:  Fall, pain. EXAM: RIGHT SHOULDER - 3 VIEW; RIGHT HUMERUS - 2 VIEW COMPARISON:  None  Available. FINDINGS: There is no evidence of fracture or dislocation. Acromioclavicular degenerative change with narrowing and osteophyte formation. IMPRESSION: Acromioclavicular degenerative changes. No acute osseous abnormalities. Electronically Signed   By: Layla Maw M.D.   On: 10/17/2023 13:10   DG Humerus Right Result Date: 10/17/2023 CLINICAL DATA:  Fall, pain. EXAM: RIGHT SHOULDER - 3 VIEW; RIGHT HUMERUS - 2 VIEW COMPARISON:  None Available. FINDINGS: There is no evidence of fracture or dislocation. Acromioclavicular degenerative change with narrowing and osteophyte formation. IMPRESSION: Acromioclavicular degenerative changes. No acute osseous abnormalities. Electronically Signed   By: Layla Maw M.D.   On: 10/17/2023 13:10   MR BRAIN W WO CONTRAST Result Date: 10/17/2023 CLINICAL DATA:  Stroke follow-up EXAM: MRI HEAD WITHOUT AND WITH CONTRAST MRV HEAD WITHOUT CONTRAST TECHNIQUE: Multiplanar, multiecho pulse sequences of the brain and surrounding structures were obtained without and with intravenous contrast. Angiographic images of the intracranial venous structures were obtained using MRV technique without intravenous contrast. CONTRAST:  10mL GADAVIST GADOBUTROL 1 MMOL/ML IV SOLN COMPARISON:  Head CT from earlier today FINDINGS: MRI head: Known acute hemorrhage in the left posterior limb of the internal capsule and adjacent gray matter structures with early methemoglobin seen as T1 shortening on sagittal images. There is a rim of edema which is expected. No underlying infarct or vascular lesion is detected. Patchy chronic right posterior frontal and parietal cortically based infarcts. Ischemic gliosis in the cerebral white matter with chronic lacunar infarcts in the deep watershed region. FLAIR hyperintensity in the sulcal spaces bilaterally, likely related to oxygenation in this intubated patient. No leptomeningeal enhancement or intraventricular debris. Unchanged chronic bilateral  ICA occlusion with altered flow voids. There has been recent CTA. No acute finding in the sinuses or orbits. MRV: Some venous opacification preceding CTA. The left transverse and sigmoid sinus is strongly dominant. Most of right transverse and sigmoid flow is from the vein of [place]. No stricture or occlusion is seen. Symmetric flow in the deep veins. IMPRESSION: Brain MRI: 1. No mass  or infarct seen underlying the patient's ICH. 2. Chronic small vessel disease and remote right frontal parietal cortex infarcts. 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 MRV: Negative.  No explanation for the hemorrhage. Electronically Signed   By: Tiburcio Pea M.D.   On: 10/17/2023 11:14   MR Venogram Head Result Date: 10/17/2023 CLINICAL DATA:  Stroke follow-up EXAM: MRI HEAD WITHOUT AND WITH CONTRAST MRV HEAD WITHOUT CONTRAST TECHNIQUE: Multiplanar, multiecho pulse sequences of the brain and surrounding structures were obtained without and with intravenous contrast. Angiographic images of the intracranial venous structures were obtained using MRV technique without intravenous contrast. CONTRAST:  10mL GADAVIST GADOBUTROL 1 MMOL/ML IV SOLN COMPARISON:  Head CT from earlier today FINDINGS: MRI head: Known acute hemorrhage in the left posterior limb of the internal capsule and adjacent gray matter structures with early methemoglobin seen as T1 shortening on sagittal images. There is a rim of edema which is expected. No underlying infarct or vascular lesion is detected. Patchy chronic right posterior frontal and parietal cortically based infarcts. Ischemic gliosis in the cerebral white matter with chronic lacunar infarcts in the deep watershed region. FLAIR hyperintensity in the sulcal spaces bilaterally, likely related to oxygenation in this intubated patient. No leptomeningeal enhancement or intraventricular debris. Unchanged chronic bilateral ICA occlusion with altered flow voids. There has been recent CTA. No acute finding in  the sinuses or orbits. MRV: Some venous opacification preceding CTA. The left transverse and sigmoid sinus is strongly dominant. Most of right transverse and sigmoid flow is from the vein of Lab[place]. No stricture or occlusion is seen. Symmetric flow in the deep veins. IMPRESSION: Brain MRI: 1. No mass or infarct seen underlying the patient's ICH. 2. Chronic small vessel disease and remote right frontal parietal cortex infarcts. 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 MRV: Negative.  No explanation for the hemorrhage. Electronically Signed   By: Tiburcio Pea M.D.   On: 10/17/2023 11:14     LOS: 3 days   Total time spent in review of labs and imaging, patient evaluation, formulation of plan, documentation and communication with family: 35 minutes  Lanae Boast, MD [facility]  10/19/2023, 8:42 AM

## 2023-10-20 DIAGNOSIS — I619 Nontraumatic intracerebral hemorrhage, unspecified: Secondary | ICD-10-CM | POA: Diagnosis not present

## 2023-10-20 MED ORDER — CLONAZEPAM 0.125 MG PO TBDP
0.2500 mg | ORAL_TABLET | Freq: Two times a day (BID) | ORAL | Status: DC
  Administered 2023-10-20 – 2023-10-24 (×9): 0.25 mg via ORAL
  Filled 2023-10-20 (×9): qty 2

## 2023-10-20 NOTE — Progress Notes (Signed)
PROGRESS NOTE April [name]  AVW:[id number] DOB: 02-17-1956 DOA: 10/16/2023 PCP: Barbie Banner, MD  Brief Narrative/Hospital Course: 67 yof found on bathroom floor w/ right arm bent behind her reportedly she passed out and was laying on floor called her neighbor for help and EMS was called.she denied hitting head but had bruising on right arm. Later in ER reported right arm & leg  weakness since about 4pm on 20th,   CT showed left BG subcortical hemorrhage Prior to transport to [facility] became more somnolent and was intubated for airway protection. F/u CT head showed " Intraparenchymal hematoma again noted in the left basal ganglia measuring 2.3 x 2.3 x 2.2 cm compared to 2.3 x 2.3 x 1.8 cm previously".She was admitted by Neurology for likely nontraumatic left BG hemorrhage, UDS positive for THC, mental status decline in 12/21 intubated prior to transport, repeat CT head with minimal changes but some amount of surrounding edema.  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 12/21 patient extubated 12/23 transferred to floor TRH consulted> PT OT has recommended skilled nursing facility and awaiting for placement.  Subjective: Patient seen and examined Mildly sleepy but able to wake up and interact, no new complaints Neurology NP at bedside as well Overnight afebrile no new complaints  Assessment and Plan: Principal Problem:   Hemorrhagic stroke [facility]) Active Problems:   ICH (intracerebral hemorrhage) (HCC)   Intracerebral hemorrhage left basal ganglia ICH: Etiology likely hypertensive.  Patient completed extensive workup with CT head CT head and neck repeat CT head 12/21, MRI MRV-no acute finding other than chronic issues as mentioned above. 2D echo-EF 65-70%, G2 DD, mitral valve normal aortic valve not well-visualized LDL 52 at goal, A1c 5.6 PTA on Plavix>  discontinued now due to ICH. Some weakness on the left side, so needs SNF PTOT  Hypertension HFpEF: PTA Lasix 20 mg.Off nicardipine. BP well-controlled at goal 130-150 w/o meds.  Volume status stable  Prior CVA on Plavix Hyperlipidemia: LDL at goal.PTA on simvastatin cont same.Plavix discontinued as above.  Somnolence: Likely from Klonopin.  Resuming at lower dose as she is on chronic benzo at home.  Monitor.   Acute respiratory failure: COPD: PTA on Trelegy.needing intubation in the setting of ICH successfully extubated 12/21, on nasal cannula 3l Continue Pulmicort/Yupelri/Brovana, prn nebs, IS wean o2 as able, not wheezing.  Tobacco abuse Substance abuse UDS positive for THC: Cessation counseling  Depression: Mood stable continue Effexor and trazodone.  Klonopin held 2/2 somnolence in ICU- resumed at low dose 0.25mg  bid  Dysphagia: SLP to continue  Morbid obesity: Patient's Body mass index is 40.26 kg/m. : Will benefit with PCP follow-up, weight loss  healthy lifestyle and outpatient sleep evaluation.   DVT prophylaxis: enoxaparin (LOVENOX) injection 40 mg Start: 10/18/23 2200 SCD's Start: 10/17/23 8295 Code Status:   Code Status: Limited: Do not attempt resuscitation (DNR) -DNR-LIMITED -Do Not Intubate/DNI  Family Communication: plan of care discussed with patien at bedside. Patient status is: Remains hospitalized because of severity of illness Level of care: Telemetry Medical   Dispo: The patient is from: home lives independently            Anticipated disposition: SNF once bed available.  She is medically stable.    Objective: Vitals last 24 hrs: Vitals:   10/19/23 2024 10/20/23 0006 10/20/23 0357 10/20/23 0752  BP: (!) 140/62 (!) 152/72 122/62 111/60  Pulse: 91  83 68 75  Resp: 19 20 16    Temp: 99.1 F (37.3 C) 99.1 F (37.3 C) 98.4 F (36.9 C) 98.4 F (36.9 C)  TempSrc: Oral Oral Oral Axillary  SpO2: 97% 92% 91% 95%  Weight:      Height:       Weight  change:   Physical Examination: General exam: alert awake HEENT:Oral mucosa moist, Ear/Nose WNL grossly Respiratory system: Bilaterally clear BS,no use of accessory muscle Cardiovascular system: S1 & S2 +, No JVD. Gastrointestinal system: Abdomen soft,NT,ND, BS+ Nervous System: Alert, awake, moving all extremities w/ mild weakness on the left side Extremities: LE edema neg,distal peripheral pulses palpable and warm.  Skin: No rashes,no icterus. MSK: Normal muscle bulk,tone, power   Medications reviewed:  Scheduled Meds:  arformoterol  15 mcg Nebulization BID   budesonide (PULMICORT) nebulizer solution  0.5 mg Nebulization BID   clonazepam  0.25 mg Oral BID   enoxaparin (LOVENOX) injection  40 mg Subcutaneous QHS   famotidine  20 mg Oral BID   nicotine  14 mg Transdermal Daily   revefenacin  175 mcg Nebulization Daily   rosuvastatin  10 mg Oral Daily   senna-docusate  1 tablet Oral BID   traZODone  300 mg Oral QHS   venlafaxine XR  150 mg Oral Q breakfast   Continuous Infusions:    Diet Order             Diet regular Room service appropriate? Yes; Fluid consistency: Thin  Diet effective now                   Intake/Output Summary (Last 24 hours) at 10/20/2023 1138 Last data filed at 10/20/2023 0010 Gross per 24 hour  Intake 240 ml  Output 800 ml  Net -560 ml   Net IO Since Admission: -2,094.57 mL [10/20/23 1138]  Wt Readings from Last 3 Encounters:  10/17/23 103.1 kg  03/05/23 109.2 kg  12/01/22 105.2 kg     Unresulted Labs (From admission, onward)    None     Data Reviewed: I have personally reviewed following labs and imaging studies CBC: Recent Labs  Lab 10/16/23 2145 10/17/23 0513 10/17/23 0536 10/18/23 0306  WBC 9.7 9.9  --  9.7  NEUTROABS 7.8*  --   --   --   HGB 15.3* 14.0 14.3 13.4  HCT 49.7* 45.0 42.0 43.6  MCV 90.5 88.2  --  89.3  PLT 174 167  --  143*   Basic Metabolic Panel:  Recent Labs  Lab 10/16/23 2145 10/17/23 0513  10/17/23 0536 10/18/23 0306  NA 138 142 139 141  K 3.7 3.5 3.4* 3.9  CL 96* 103  --  100  CO2 33* 31  --  32  GLUCOSE 110* 103*  --  107*  BUN 10 7*  --  <5*  CREATININE 0.90 1.02*  --  0.85  CALCIUM 9.1 8.5*  --  8.5*   GFR: Estimated Creatinine Clearance: 75.7 mL/min (by C-G formula based on SCr of 0.85 mg/dL). Liver Function Tests:  Recent Labs  Lab 10/16/23 2145 10/17/23 0513 10/18/23 0306  AST 13* 14* 13*  ALT 12 9 10   ALKPHOS 35* 32* 31*  BILITOT 0.5 0.6 0.6  PROT 6.8 5.6* 5.7*  ALBUMIN 3.4* 2.8* 2.8*   No results for input(s): "LIPASE", "AMYLASE" in the last 168 hours. No results for input(s): "AMMONIA" in the last 168 hours. Coagulation Profile:  Recent Labs  Lab 10/16/23 2145  INR  1.0   No results for input(s): "PROBNP" in the last 168 hours.  Recent Labs    10/18/23 0306  HGBA1C 5.6   Recent Labs  Lab 10/18/23 1944 10/18/23 2328 10/19/23 0318 10/19/23 0808 10/19/23 1136  GLUCAP 102* 98 89 96 102*   Recent Labs    10/18/23 0306  CHOL 119  HDL 45  LDLCALC 52  TRIG 110  107  CHOLHDL 2.6   No results for input(s): "TSH", "T4TOTAL", "FREET4", "T3FREE", "THYROIDAB" in the last 72 hours. Sepsis Labs: No results for input(s): "PROCALCITON", "LATICACIDVEN" in the last 168 hours. Recent Results (from the past 240 hours)  MRSA Next Gen by PCR, Nasal     Status: None   Collection Time: 10/17/23  3:26 AM   Specimen: Nasal Mucosa; Nasal Swab  Result Value Ref Range Status   MRSA by PCR Next Gen NOT DETECTED NOT DETECTED Final    Comment: (NOTE) The GeneXpert MRSA Assay (FDA approved for NASAL specimens only), is one component of a comprehensive MRSA colonization surveillance program. It is not intended to diagnose MRSA infection nor to guide or monitor treatment for MRSA infections. Test performance is not FDA approved in patients less than 36 years old. Performed at [facility], [address], [place], [place] [address]      Antimicrobials/Microbiology: Anti-infectives (From admission, onward)    None      No results found for: "SDES", "SPECREQUEST", "CULT", "REPTSTATUS"   Radiology Studies: ECHOCARDIOGRAM COMPLETE Result Date: 10/18/2023    ECHOCARDIOGRAM REPORT   Patient Name:   April [name] Date of Exam: 10/18/2023 Medical Rec #:  [id number]     Height:       63.0 in Accession #:    [id number]    Weight:       227.3 lb Date of Birth:  06/21/1956    BSA:          2.042 m Patient Age:    67 years      BP:           108/48 mmHg Patient Gender: F             HR:           65 bpm. Exam Location:  Inpatient Procedure: 2D Echo, Cardiac Doppler and Color Doppler Indications:    Stroke l63.9  History:        Patient has prior history of Echocardiogram examinations, most                 recent 07/04/2022. CHF, TIA, COPD and Stroke; Risk                 Factors:Dyslipidemia and Hypertension. Ductal carcinoma in situ                 (DCIS) of right breast, Tobacco abuse.  Sonographer:    Celesta Gentile RCS Referring Phys: [id number] [facility] IMPRESSIONS  1. Left ventricular ejection fraction, by estimation, is 65 to 70%. The left ventricle has normal function. The left ventricle has no regional wall motion abnormalities. There is mild left ventricular hypertrophy. Left ventricular diastolic parameters are consistent with Grade II diastolic dysfunction (pseudonormalization). Elevated left atrial pressure.  2. Right ventricular systolic function is normal. The right ventricular size is normal. Tricuspid regurgitation signal is inadequate for assessing PA pressure.  3. Left atrial size was moderately dilated.  4. The mitral valve is normal in structure. Trivial mitral valve regurgitation.  5. The aortic  valve was not well visualized. Aortic valve regurgitation is not visualized. No aortic stenosis is present.  6. The inferior vena cava is dilated in size with >50% respiratory variability, suggesting right atrial pressure of 8  mmHg. FINDINGS  Left Ventricle: Left ventricular ejection fraction, by estimation, is 65 to 70%. The left ventricle has normal function. The left ventricle has no regional wall motion abnormalities. The left ventricular internal cavity size was normal in size. There is  mild left ventricular hypertrophy. Left ventricular diastolic parameters are consistent with Grade II diastolic dysfunction (pseudonormalization). Elevated left atrial pressure. Right Ventricle: The right ventricular size is normal. No increase in right ventricular wall thickness. Right ventricular systolic function is normal. Tricuspid regurgitation signal is inadequate for assessing PA pressure. Left Atrium: Left atrial size was moderately dilated. Right Atrium: Right atrial size was normal in size. Pericardium: Trivial pericardial effusion is present. Presence of epicardial fat layer. Mitral Valve: The mitral valve is normal in structure. Trivial mitral valve regurgitation. Tricuspid Valve: The tricuspid valve is normal in structure. Tricuspid valve regurgitation is trivial. Aortic Valve: The aortic valve was not well visualized. Aortic valve regurgitation is not visualized. No aortic stenosis is present. Aortic valve mean gradient measures 8.0 mmHg. Aortic valve peak gradient measures 13.1 mmHg. Aortic valve area, by VTI measures 1.86 cm. Pulmonic Valve: The pulmonic valve was not well visualized. Pulmonic valve regurgitation is trivial. Aorta: The aortic root is normal in size and structure. Venous: The inferior vena cava is dilated in size with greater than 50% respiratory variability, suggesting right atrial pressure of 8 mmHg. IAS/Shunts: The interatrial septum was not well visualized.  LEFT VENTRICLE PLAX 2D LVIDd:         5.00 cm   Diastology LVIDs:         3.40 cm   LV e' medial:    7.40 cm/s LV PW:         1.30 cm   LV E/e' medial:  15.5 LV IVS:        1.10 cm   LV e' lateral:   9.36 cm/s LVOT diam:     1.70 cm   LV E/e' lateral: 12.3 LV  SV:         77 LV SV Index:   38 LVOT Area:     2.27 cm  RIGHT VENTRICLE RV S prime:     12.90 cm/s TAPSE (M-mode): 2.4 cm LEFT ATRIUM             Index        RIGHT ATRIUM           Index LA diam:        3.00 cm 1.47 cm/m   RA Area:     16.70 cm LA Vol (A2C):   87.7 ml 42.95 ml/m  RA Volume:   47.10 ml  23.07 ml/m LA Vol (A4C):   94.7 ml 46.38 ml/m LA Biplane Vol: 93.9 ml 45.99 ml/m  AORTIC VALVE AV Area (Vmax):    1.88 cm AV Area (Vmean):   1.82 cm AV Area (VTI):     1.86 cm AV Vmax:           181.00 cm/s AV Vmean:          127.000 cm/s AV VTI:            0.415 m AV Peak Grad:      13.1 mmHg AV Mean Grad:      8.0 mmHg LVOT Vmax:  150.00 cm/s LVOT Vmean:        102.000 cm/s LVOT VTI:          0.340 m LVOT/AV VTI ratio: 0.82  AORTA Ao Root diam: 3.00 cm MITRAL VALVE MV Area (PHT): 2.47 cm     SHUNTS MV E velocity: 115.00 cm/s  Systemic VTI:  0.34 m MV A velocity: 123.50 cm/s  Systemic Diam: 1.70 cm MV E/A ratio:  0.93 Epifanio Lesches MD Electronically signed by Epifanio Lesches MD Signature Date/Time: 10/18/2023/2:40:50 PM    Final      LOS: 4 days   Total time spent in review of labs and imaging, patient evaluation, formulation of plan, documentation and communication with family: 35 minutes  Lanae Boast, MD [facility]  10/20/2023, 11:38 AM

## 2023-10-20 NOTE — Progress Notes (Signed)
Physical Therapy Treatment Patient Details Name: April [name] MRN: [id number] DOB: 1956/07/06 Today's Date: 10/20/2023   History of Present Illness Pt is a 67 y.o. female presenting 12/20 with R weakness after fall in her bathroom; down time of 1 hour. 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 Intubated 12/20-12/21 PMH significant for GERD, HTN, HLD, COPD, smoker, bil breast ca, CVA.    PT Comments  On entry pt noted to have large quantity of breakfast food on R chest and bed. Pt with no awareness to fact. Even with cuing to look towards R. Pt agreeable to getting up with therapy. Pt is limited in safe mobility by R sided neglect and decreased R sided coordination. Pt requires maximal cuing and increased time for all mobility. Pt is min A for coming to EoB and modA for coming to standing. Pt requiring pericare but unable to stand for due to R sided weakness and decreased coordination so returned to supine in bed. D/c plans remain appropriate. PT will continue to follow acutely.   If plan is discharge home, recommend the following: Two people to help with walking and/or transfers;Two people to help with bathing/dressing/bathroom;Direct supervision/assist for financial management;Direct supervision/assist for medications management;Help with stairs or ramp for entrance;Assist for transportation   Can travel by private vehicle     No  Equipment Recommendations  Other (comment) (to be determined at next venue)       Precautions / Restrictions Precautions Precautions: Fall;Other (comment) Precaution Comments: R neglect/inattention Restrictions Weight Bearing Restrictions Per Provider Order: No     Mobility  Bed Mobility Overal bed mobility: Needs Assistance Bed Mobility: Rolling, Sidelying to Sit, Sit to Supine Rolling: Min assist, Mod assist Sidelying to sit: Min assist   Sit to supine: Min assist   General bed mobility comments: min physical A for rolling R and L,  maximal cuinf for rolling R, increased cuing for bringing R LE around with body to come to sitting on L side of the bed, min A for return to bed, requires maximal cuing for rolling for pericare    Transfers Overall transfer level: Needs assistance   Transfers: Sit to/from Stand Sit to Stand: Mod assist           General transfer comment: maximal cuing for set up and placement of R hand on RW, needs physical movement of head to be able to attend to R side, modA for power up into standing, attempted to take pivotal steps to recliner on L side, however noted to have had a BM, due to decreased coordination and safety returned to seated and then to supine for pericare.    Modified Rankin (Stroke Patients Only) Modified Rankin (Stroke Patients Only) Pre-Morbid Rankin Score: No symptoms Modified Rankin: Severe disability     Balance Overall balance assessment: Needs assistance Sitting-balance support: Bilateral upper extremity supported, Feet supported Sitting balance-Leahy Scale: Poor Sitting balance - Comments: continues to have minimal R lateral lean in sitting                                    Cognition Arousal: Alert Behavior During Therapy: Restless, Flat affect Overall Cognitive Status: No family/caregiver present to determine baseline cognitive functioning Area of Impairment: Attention, Memory, Following commands, Safety/judgement, Awareness, Problem solving                   Current Attention Level: Focused Memory: Decreased  short-term memory Following Commands: Follows one step commands inconsistently, Follows one step commands with increased time (needing up to mod cues to do so) Safety/Judgement: Decreased awareness of safety, Decreased awareness of deficits Awareness: Intellectual Problem Solving: Slow processing, Decreased initiation, Difficulty sequencing, Requires verbal cues, Requires tactile cues General Comments: requires increased cuing  for task completion, very limited attention to R limited ability to purposefully use R hand for tasks like reaching up to RW           General Comments General comments (skin integrity, edema, etc.): VSS on 2L O2 via [place],      Pertinent Vitals/Pain Pain Assessment Pain Assessment: Faces Faces Pain Scale: Hurts a little bit Pain Location: generalized with movement Pain Descriptors / Indicators: Grimacing, Guarding Pain Intervention(s): Limited activity within patient's tolerance, Monitored during session, Repositioned     PT Goals (current goals can now be found in the care plan section) Acute Rehab PT Goals Patient Stated Goal: not stated PT Goal Formulation: Patient unable to participate in goal setting Time For Goal Achievement: 11/01/23 Potential to Achieve Goals: Fair Progress towards PT goals: Progressing toward goals    Frequency    Min 1X/week       AM-PAC PT "6 Clicks" Mobility   Outcome Measure  Help needed turning from your back to your side while in a flat bed without using bedrails?: A Lot Help needed moving from lying on your back to sitting on the side of a flat bed without using bedrails?: Total Help needed moving to and from a bed to a chair (including a wheelchair)?: Total Help needed standing up from a chair using your arms (e.g., wheelchair or bedside chair)?: Total Help needed to walk in hospital room?: Total Help needed climbing 3-5 steps with a railing? : Total 6 Click Score: 7    End of Session Equipment Utilized During Treatment: Oxygen Activity Tolerance: Patient tolerated treatment well Patient left: in bed;with call bell/phone within reach;with bed alarm set;with nursing/sitter in room (with morning meds) Nurse Communication: Mobility status;Need for lift equipment PT Visit Diagnosis: Other abnormalities of gait and mobility (R26.89);Muscle weakness (generalized) (M62.81);Other symptoms and signs involving the nervous system (V42.595)      Time: 6387-5643 PT Time Calculation (min) (ACUTE ONLY): 28 min  Charges:    $Therapeutic Activity: 23-37 mins PT General Charges $$ ACUTE PT VISIT: 1 Visit                     Taiwana Willison B. Beverely Risen PT, DPT Acute Rehabilitation Services Please use secure chat or  Call Office [phone]    Elon Alas [facility] 10/20/2023, 9:24 AM

## 2023-10-20 NOTE — TOC Progression Note (Signed)
Transition of Care [facility]) - Progression Note    Patient Details  Name: April [name] MRN: [id number] Date of Birth: 1955/12/08  Transition of Care [facility]) CM/SW Contact  Carley Hammed, LCSW Phone Number: 10/20/2023, 12:00 PM  Clinical Narrative:    Bed offers were discussed with son and emailed as well. Medicare.gov info was provided. Son to discuss with pt and advise CSW when a choice has been made. TOC continues to follow.    Expected Discharge Plan: Skilled Nursing Facility Barriers to Discharge: English as a second language teacher, SNF Pending bed offer  Expected Discharge Plan and Services In-house Referral: Clinical Social Work   Post Acute Care Choice: Skilled Nursing Facility Living arrangements for the past 2 months: Single Family Home                                       Social Determinants of Health (SDOH) Interventions SDOH Screenings   Food Insecurity: Patient Unable To Answer (10/17/2023)  Housing: Patient Unable To Answer (10/17/2023)  Transportation Needs: No Transportation Needs (10/01/2022)  Utilities: Not At Risk (10/01/2022)  Tobacco Use: High Risk (09/03/2023)   Received from [facility]    Readmission Risk Interventions     No data to display

## 2023-10-20 NOTE — Plan of Care (Signed)
  Problem: Education: Goal: Knowledge of General Education information will improve Description: Including pain rating scale, medication(s)/side effects and non-pharmacologic comfort measures Outcome: Progressing   Problem: Health Behavior/Discharge Planning: Goal: Ability to manage health-related needs will improve Outcome: Progressing   Problem: Clinical Measurements: Goal: Ability to maintain clinical measurements within normal limits will improve Outcome: Progressing Goal: Will remain free from infection Outcome: Progressing Goal: Diagnostic test results will improve Outcome: Progressing Goal: Respiratory complications will improve Outcome: Progressing Goal: Cardiovascular complication will be avoided Outcome: Progressing   Problem: Activity: Goal: Risk for activity intolerance will decrease Outcome: Progressing   Problem: Nutrition: Goal: Adequate nutrition will be maintained Outcome: Progressing   Problem: Coping: Goal: Level of anxiety will decrease Outcome: Progressing   Problem: Elimination: Goal: Will not experience complications related to bowel motility Outcome: Progressing Goal: Will not experience complications related to urinary retention Outcome: Progressing   Problem: Pain Management: Goal: General experience of comfort will improve Outcome: Progressing   Problem: Safety: Goal: Ability to remain free from injury will improve Outcome: Progressing   Problem: Skin Integrity: Goal: Risk for impaired skin integrity will decrease Outcome: Progressing   Problem: Education: Goal: Knowledge of disease or condition will improve Outcome: Progressing Goal: Knowledge of secondary prevention will improve (MUST DOCUMENT ALL) Outcome: Progressing Goal: Knowledge of patient specific risk factors will improve Loraine Leriche N/A or DELETE if not current risk factor) Outcome: Progressing   Problem: Intracerebral Hemorrhage Tissue Perfusion: Goal: Complications of  Intracerebral Hemorrhage will be minimized Outcome: Progressing   Problem: Coping: Goal: Will verbalize positive feelings about self Outcome: Progressing Goal: Will identify appropriate support needs Outcome: Progressing   Problem: Health Behavior/Discharge Planning: Goal: Ability to manage health-related needs will improve Outcome: Progressing Goal: Goals will be collaboratively established with patient/family Outcome: Progressing   Problem: Self-Care: Goal: Ability to participate in self-care as condition permits will improve Outcome: Progressing Goal: Verbalization of feelings and concerns over difficulty with self-care will improve Outcome: Progressing Goal: Ability to communicate needs accurately will improve Outcome: Progressing   Problem: Nutrition: Goal: Risk of aspiration will decrease Outcome: Progressing Goal: Dietary intake will improve Outcome: Progressing   Problem: Education: Goal: Ability to describe self-care measures that may prevent or decrease complications (Diabetes Survival Skills Education) will improve Outcome: Progressing Goal: Individualized Educational Video(s) Outcome: Progressing   Problem: Coping: Goal: Ability to adjust to condition or change in health will improve Outcome: Progressing   Problem: Fluid Volume: Goal: Ability to maintain a balanced intake and output will improve Outcome: Progressing   Problem: Health Behavior/Discharge Planning: Goal: Ability to identify and utilize available resources and services will improve Outcome: Progressing Goal: Ability to manage health-related needs will improve Outcome: Progressing   Problem: Nutritional: Goal: Maintenance of adequate nutrition will improve Outcome: Progressing Goal: Progress toward achieving an optimal weight will improve Outcome: Progressing   Problem: Skin Integrity: Goal: Risk for impaired skin integrity will decrease Outcome: Progressing   Problem: Tissue  Perfusion: Goal: Adequacy of tissue perfusion will improve Outcome: Progressing   Problem: Activity: Goal: Ability to tolerate increased activity will improve Outcome: Progressing   Problem: Respiratory: Goal: Ability to maintain a clear airway and adequate ventilation will improve Outcome: Progressing   Problem: Role Relationship: Goal: Method of communication will improve Outcome: Progressing

## 2023-10-20 NOTE — Progress Notes (Signed)
STROKE TEAM PROGRESS NOTE   BRIEF HPI [name] is a 67 y.o. female with history of GERD, hypertension, hyperlipidemia, COPD, marijuana use and smoking presenting with right-sided weakness and somnolence.  Patient was intubated prior to arrival at the hospital, and CT head revealed left basal ganglia ICH.  NIH on Admission 24   SIGNIFICANT HOSPITAL EVENTS 12/21-patient admitted with small left basal ganglia ICH, patient extubated 12/22-patient transferred out of the ICU  INTERIM HISTORY/SUBJECTIVE Patient has been hemodynamically stable overnight.  Her neurological exam is stable, and she is awaiting transfer to a SNF for rehab.  OBJECTIVE  CBC    Component Value Date/Time   WBC 9.7 10/18/2023 0306   RBC 4.88 10/18/2023 0306   HGB 13.4 10/18/2023 0306   HGB 15.7 (H) 08/15/2022 1320   HCT 43.6 10/18/2023 0306   HCT 41.2 11/12/2015 0559   PLT 143 (L) 10/18/2023 0306   PLT 184 08/15/2022 1320   MCV 89.3 10/18/2023 0306   MCH 27.5 10/18/2023 0306   MCHC 30.7 10/18/2023 0306   RDW 14.9 10/18/2023 0306   LYMPHSABS 1.2 10/16/2023 2145   MONOABS 0.5 10/16/2023 2145   EOSABS 0.1 10/16/2023 2145   BASOSABS 0.0 10/16/2023 2145    BMET    Component Value Date/Time   NA 141 10/18/2023 0306   K 3.9 10/18/2023 0306   CL 100 10/18/2023 0306   CO2 32 10/18/2023 0306   GLUCOSE 107 (H) 10/18/2023 0306   BUN <5 (L) 10/18/2023 0306   CREATININE 0.85 10/18/2023 0306   CREATININE 1.03 (H) 08/15/2022 1320   CALCIUM 8.5 (L) 10/18/2023 0306   GFRNONAA >60 10/18/2023 0306   GFRNONAA >60 08/15/2022 1320    IMAGING past 24 hours No results found.   Vitals:   10/19/23 2024 10/20/23 0006 10/20/23 0357 10/20/23 0752  BP: (!) 140/62 (!) 152/72 122/62 111/60  Pulse: 91 83 68 75  Resp: 19 20 16    Temp: 99.1 F (37.3 C) 99.1 F (37.3 C) 98.4 F (36.9 C) 98.4 F (36.9 C)  TempSrc: Oral Oral Oral Axillary  SpO2: 97% 92% 91% 95%  Weight:      Height:         PHYSICAL  EXAM General: Well-nourished, well-developed elderly lady, not in distress psych: Somewhat anxious but appropriate to situation CV: Regular rate and rhythm on monitor Respiratory: Respirations regular and unlabored on room air   NEURO:  Mental Status: Oriented to time, place and situation, somewhat anxious Speech/Language: speech is without dysarthria or aphasia.    Cranial Nerves:  II: PERRL.  III, IV, VI: EOMI. Eyelids elevate symmetrically.  VII: Right facial droop VIII: hearing intact to voice. IX, X: Phonation is normal.  XII: tongue is midline without fasciculations. Motor: Able to move all 4 extremities with good antigravity strength, slight drift in right arm.  Right grip is weak compared to left Tone: is normal and bulk is normal Sensation- Intact to light touch bilaterally but diminished on the right Gait- deferred   ASSESSMENT/PLAN  Intracerebral Hemorrhage:  left basal ganglia ICH Etiology: Likely hypertensive  CT head left basal ganglia IPH CTA head & neck chronic occlusion of right ICA with reconstitution up at the ophthalmic segment, chronic occlusion of proximal left ICA with weak unchanged distal reconstitution, patent vertebral artery stents Repeat CT head 12/21 at -140: Slight enlargement of left basal ganglia ICH MRI  no mass or infarct seen underlying ICH, chronic small vessel disease and remote right frontal parietal infarcts MRV  no acute abnormalities 2D Echo ejection fraction 65 to 70%  LDL 52  HgbA1c 5.6 UDS positive for THC VTE prophylaxis -lovenox clopidogrel 75 mg daily prior to admission, now on No antithrombotic secondary to ICH Therapy recommendations:  SNF Disposition: Pending  Hypertension Home meds: None Stable  off nicardipine Long term BP goal normotensive  Hyperlipidemia Home meds: Simvastatin 40 mg daily LDL 52, goal < 70 Now on crestor 10 Continue statin at discharge  Tobacco Abuse Patient smokes 1 packs per day for 40  years Smoking cessation counseling provided Nicotine patch provided Pt is willing to quit  Substance Abuse Patient uses marijuana UDS positive for  THC  Cessation education provided  Other Stroke Risk Factors Obesity, Body mass index is 40.26 kg/m., BMI >/= 30 associated with increased stroke risk, recommend weight loss, diet and exercise as appropriate   Other Active Problems COPD  Hospital day # 4  Patient seen by NP and then by MD, MD to edit note as needed. Stefany Starace E Ernestina Columbia , MSN, AGACNP-BC [facility] See Amion for schedule and pager information 10/20/2023 11:58 AM  ATTENDING NOTE: I reviewed above note and agree with the assessment and plan. Pt was seen and examined.   RN is at the bedside. Pt lying in bed, son arrived at the end of encounter. On exam, she is awake, alert, eyes open, orientated to age, place, time and people. No aphasia, fluent language, following all simple commands. Able to name and repeat. No gaze palsy, tracking bilaterally, visual field full. No obvious facial droop. Tongue midline. Bilateral UEs 5/5, no drift. Bilaterally LEs 3/5 proximal and 4/5 distally. Sensation decreased on the RUE and diminished on the RLE, b/l FTN intact grossly, gait not tested.   Pt BP currently under control. Smoking cessation and THC cessation education provided. On DVT prophylaxis, weight control education provided. Now on statin, PT and OT recommend SNF.   For detailed assessment and plan, please refer to above/below as I have made changes wherever appropriate.   Neurology will sign off. Please call with questions. Pt will follow up with stroke clinic Dr. Pearlean Brownie in about 4 weeks. Thanks for the consult.   Marvel Plan, MD PhD Stroke Neurology 10/20/2023 4:06 PM     To contact Stroke Continuity provider, please refer to [email or web address]. After hours, contact General Neurology

## 2023-10-20 NOTE — Progress Notes (Incomplete Revision)
**April [name] De-Identified via Obfuscation** April [name] April [name]   BRIEF HPI April [name] April [name] is a 67 y.o. female with history of GERD, hypertension, hyperlipidemia, COPD, marijuana use and smoking presenting with right-sided weakness and somnolence.  Patient was intubated prior to arrival at the hospital, and CT head revealed left basal ganglia ICH.  NIH on Admission 24   SIGNIFICANT HOSPITAL EVENTS 12/21-patient admitted with small left basal ganglia ICH, patient extubated 12/22-patient transferred out of the ICU  INTERIM HISTORY/SUBJECTIVE Patient has been hemodynamically stable overnight.  Her neurological exam is stable, and she is awaiting transfer to a SNF for rehab.  OBJECTIVE  CBC    Component Value Date/Time   WBC 9.7 10/18/2023 0306   RBC 4.88 10/18/2023 0306   HGB 13.4 10/18/2023 0306   HGB 15.7 (H) 08/15/2022 1320   HCT 43.6 10/18/2023 0306   HCT 41.2 11/12/2015 0559   PLT 143 (L) 10/18/2023 0306   PLT 184 08/15/2022 1320   MCV 89.3 10/18/2023 0306   MCH 27.5 10/18/2023 0306   MCHC 30.7 10/18/2023 0306   RDW 14.9 10/18/2023 0306   LYMPHSABS 1.2 10/16/2023 2145   MONOABS 0.5 10/16/2023 2145   EOSABS 0.1 10/16/2023 2145   BASOSABS 0.0 10/16/2023 2145    BMET    Component Value Date/Time   NA 141 10/18/2023 0306   K 3.9 10/18/2023 0306   CL 100 10/18/2023 0306   CO2 32 10/18/2023 0306   GLUCOSE 107 (H) 10/18/2023 0306   BUN <5 (L) 10/18/2023 0306   CREATININE 0.85 10/18/2023 0306   CREATININE 1.03 (H) 08/15/2022 1320   CALCIUM 8.5 (L) 10/18/2023 0306   GFRNONAA >60 10/18/2023 0306   GFRNONAA >60 08/15/2022 1320    IMAGING past 24 hours No results found.   Vitals:   10/19/23 2024 10/20/23 0006 10/20/23 0357 10/20/23 0752  BP: (!) 140/62 (!) 152/72 122/62 111/60  Pulse: 91 83 68 75  Resp: 19 20 16    Temp: 99.1 F (37.3 C) 99.1 F (37.3 C) 98.4 F (36.9 C) 98.4 F (36.9 C)  TempSrc: Oral Oral Oral Axillary  SpO2: 97% 92% 91% 95%  Weight:      Height:         PHYSICAL  EXAM General: Well-nourished, well-developed elderly lady, not in distress psych: Somewhat anxious but appropriate to situation CV: Regular rate and rhythm on monitor Respiratory: Respirations regular and unlabored on room air   NEURO:  Mental Status: Oriented to time, place and situation, somewhat anxious Speech/Language: speech is without dysarthria or aphasia.    Cranial Nerves:  II: PERRL.  III, IV, VI: EOMI. Eyelids elevate symmetrically.  VII: Right facial droop VIII: hearing intact to voice. IX, X: Phonation is normal.  XII: tongue is midline without fasciculations. Motor: Able to move all 4 extremities with good antigravity strength, slight drift in right arm.  Right grip is weak compared to left Tone: is normal and bulk is normal Sensation- Intact to light touch bilaterally but diminished on the right Gait- deferred   ASSESSMENT/[name]  Intracerebral Hemorrhage:  left basal ganglia ICH Etiology: Likely hypertensive  CT head left basal ganglia IPH CTA head & neck chronic occlusion of right ICA with reconstitution up at the ophthalmic segment, chronic Olu occlusion of proximal left ICA with weak unchanged distal reconstitution, patent vertebral artery stents Repeat CT head 12/21 at -140: Slight enlargement of left basal ganglia ICH MRI  no mass or infarct seen underlying ICH, chronic small vessel disease and remote right frontal parietal infarcts  MRV no acute abnormalities 2D Echo ejection fraction 65 to 70%.  Left atrium moderately dilated LDL 52 mg percent HgbA1c 5.6 VTE prophylaxis -SCDs clopidogrel 75 mg daily prior to admission, now on No antithrombotic secondary to ICH Therapy recommendations:  SNF Disposition: Pending  Hypertension Home meds: None Stable off nicardipine Blood Pressure Goal: SBP between 130-150 for 24 hours and then less than 160   Hyperlipidemia Home meds: Simvastatin 40 mg daily LDL 52., goal < 70 Continue statin at discharge  Risk for  diabetes type II  Home meds: None HgbA1c 5.6., goal < 7.0  Tobacco Abuse Patient smokes 1 packs per day for 40 years Will discuss tobacco cessation and nicotine replacement when patient is extubated  Substance Abuse Patient uses marijuana UDS positive for  [facility]  Will discuss cessation when patient extubated  Dysphagia 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 Yes; Fluid consistency: Thin   Advance diet as tolerated  Respiratory failure Patient intubated prior to arrival due to somnolence Ventilator management per April [name] Extubated 12/21  Other April [name] Risk Factors Obesity, Body mass index is 40.26 kg/m., BMI >/= 30 associated with increased April [name] risk, recommend weight loss, diet and exercise as appropriate    Other Active Problems None  Hospital day # 4  Patient seen by NP and then by MD, MD to edit April [name] as needed. April [name] April [name] , MSN, AGACNP-BC [facility] See Amion for schedule and pager information 10/20/2023 11:58 AM  ATTENDING April [name]: I reviewed above April [name] and agree with the assessment and [name]. Pt was seen and examined.   RN is at the bedside. Pt lying in bed, son arrived at the end of encounter. On exam, she is awake, alert, eyes open, orientated to age, place, time and people. No aphasia, fluent language, following all simple commands. Able to name and repeat. No gaze palsy, tracking bilaterally, visual field full. No obvious facial droop. Tongue midline. Bilateral UEs 5/5, no drift. Bilaterally LEs 3/5 proximal and 4/5 distally. Sensation decreased on the RUE and diminished on the RLE, b/l FTN intact grossly, gait not tested.     For detailed assessment and [name], please refer to above/below as I have made changes wherever appropriate.   April [name] Plan, MD PhD April [name] Neurology 10/20/2023 4:06 PM     To contact April [name] Continuity provider, please refer to [email or web address]. After hours, contact  General Neurology

## 2023-10-21 DIAGNOSIS — I619 Nontraumatic intracerebral hemorrhage, unspecified: Secondary | ICD-10-CM

## 2023-10-21 MED ORDER — SODIUM CHLORIDE 0.9 % IV SOLN: 12.5000 mg | Freq: Four times a day (QID) | INTRAVENOUS | Status: DC | PRN

## 2023-10-21 MED ORDER — PROMETHAZINE HCL 25 MG PO TABS
25.0000 mg | ORAL_TABLET | Freq: Four times a day (QID) | ORAL | Status: DC | PRN
  Administered 2023-10-21: 25 mg via ORAL
  Filled 2023-10-21 (×3): qty 1

## 2023-10-21 MED ORDER — PROMETHAZINE HCL 25 MG RE SUPP: 25.0000 mg | Freq: Four times a day (QID) | RECTAL | Status: DC | PRN

## 2023-10-21 NOTE — Plan of Care (Signed)
  Problem: Education: Goal: Knowledge of General Education information will improve Description: Including pain rating scale, medication(s)/side effects and non-pharmacologic comfort measures Outcome: Progressing   Problem: Health Behavior/Discharge Planning: Goal: Ability to manage health-related needs will improve Outcome: Progressing   Problem: Clinical Measurements: Goal: Ability to maintain clinical measurements within normal limits will improve Outcome: Progressing Goal: Will remain free from infection Outcome: Progressing Goal: Diagnostic test results will improve Outcome: Progressing Goal: Respiratory complications will improve Outcome: Progressing Goal: Cardiovascular complication will be avoided Outcome: Progressing   Problem: Activity: Goal: Risk for activity intolerance will decrease Outcome: Progressing   Problem: Nutrition: Goal: Adequate nutrition will be maintained Outcome: Progressing   Problem: Coping: Goal: Level of anxiety will decrease Outcome: Progressing   Problem: Elimination: Goal: Will not experience complications related to bowel motility Outcome: Progressing Goal: Will not experience complications related to urinary retention Outcome: Progressing   Problem: Pain Management: Goal: General experience of comfort will improve Outcome: Progressing   Problem: Safety: Goal: Ability to remain free from injury will improve Outcome: Progressing   Problem: Skin Integrity: Goal: Risk for impaired skin integrity will decrease Outcome: Progressing   Problem: Education: Goal: Knowledge of disease or condition will improve Outcome: Progressing Goal: Knowledge of secondary prevention will improve (MUST DOCUMENT ALL) Outcome: Progressing Goal: Knowledge of patient specific risk factors will improve Loraine Leriche N/A or DELETE if not current risk factor) Outcome: Progressing   Problem: Intracerebral Hemorrhage Tissue Perfusion: Goal: Complications of  Intracerebral Hemorrhage will be minimized Outcome: Progressing   Problem: Coping: Goal: Will verbalize positive feelings about self Outcome: Progressing Goal: Will identify appropriate support needs Outcome: Progressing   Problem: Health Behavior/Discharge Planning: Goal: Ability to manage health-related needs will improve Outcome: Progressing Goal: Goals will be collaboratively established with patient/family Outcome: Progressing   Problem: Self-Care: Goal: Ability to participate in self-care as condition permits will improve Outcome: Progressing Goal: Verbalization of feelings and concerns over difficulty with self-care will improve Outcome: Progressing Goal: Ability to communicate needs accurately will improve Outcome: Progressing   Problem: Nutrition: Goal: Risk of aspiration will decrease Outcome: Progressing Goal: Dietary intake will improve Outcome: Progressing   Problem: Education: Goal: Ability to describe self-care measures that may prevent or decrease complications (Diabetes Survival Skills Education) will improve Outcome: Progressing Goal: Individualized Educational Video(s) Outcome: Progressing   Problem: Coping: Goal: Ability to adjust to condition or change in health will improve Outcome: Progressing   Problem: Fluid Volume: Goal: Ability to maintain a balanced intake and output will improve Outcome: Progressing   Problem: Health Behavior/Discharge Planning: Goal: Ability to identify and utilize available resources and services will improve Outcome: Progressing Goal: Ability to manage health-related needs will improve Outcome: Progressing   Problem: Nutritional: Goal: Maintenance of adequate nutrition will improve Outcome: Progressing Goal: Progress toward achieving an optimal weight will improve Outcome: Progressing   Problem: Skin Integrity: Goal: Risk for impaired skin integrity will decrease Outcome: Progressing   Problem: Tissue  Perfusion: Goal: Adequacy of tissue perfusion will improve Outcome: Progressing   Problem: Activity: Goal: Ability to tolerate increased activity will improve Outcome: Progressing   Problem: Respiratory: Goal: Ability to maintain a clear airway and adequate ventilation will improve Outcome: Progressing   Problem: Role Relationship: Goal: Method of communication will improve Outcome: Progressing

## 2023-10-21 NOTE — Plan of Care (Signed)
  Problem: Health Behavior/Discharge Planning: Goal: Ability to manage health-related needs will improve Outcome: Progressing   

## 2023-10-21 NOTE — Progress Notes (Incomplete)
STROKE TEAM PROGRESS NOTE   BRIEF HPI [name] is a 67 y.o. female with history of GERD, hypertension, hyperlipidemia, COPD, marijuana use and smoking presenting with right-sided weakness and somnolence.  Patient was intubated prior to arrival at the hospital, and CT head revealed left basal ganglia ICH.  NIH on Admission 24   SIGNIFICANT HOSPITAL EVENTS 12/21-patient admitted with small left basal ganglia ICH, patient extubated 12/22-patient transferred out of the ICU  INTERIM HISTORY/SUBJECTIVE Patient has been hemodynamically stable overnight.  Her neurological exam is stable, and she is awaiting transfer to a SNF for rehab.  OBJECTIVE  CBC    Component Value Date/Time   WBC 9.7 10/18/2023 0306   RBC 4.88 10/18/2023 0306   HGB 13.4 10/18/2023 0306   HGB 15.7 (H) 08/15/2022 1320   HCT 43.6 10/18/2023 0306   HCT 41.2 11/12/2015 0559   PLT 143 (L) 10/18/2023 0306   PLT 184 08/15/2022 1320   MCV 89.3 10/18/2023 0306   MCH 27.5 10/18/2023 0306   MCHC 30.7 10/18/2023 0306   RDW 14.9 10/18/2023 0306   LYMPHSABS 1.2 10/16/2023 2145   MONOABS 0.5 10/16/2023 2145   EOSABS 0.1 10/16/2023 2145   BASOSABS 0.0 10/16/2023 2145    BMET    Component Value Date/Time   NA 141 10/18/2023 0306   K 3.9 10/18/2023 0306   CL 100 10/18/2023 0306   CO2 32 10/18/2023 0306   GLUCOSE 107 (H) 10/18/2023 0306   BUN <5 (L) 10/18/2023 0306   CREATININE 0.85 10/18/2023 0306   CREATININE 1.03 (H) 08/15/2022 1320   CALCIUM 8.5 (L) 10/18/2023 0306   GFRNONAA >60 10/18/2023 0306   GFRNONAA >60 08/15/2022 1320    IMAGING past 24 hours No results found.   Vitals:   10/19/23 2024 10/20/23 0006 10/20/23 0357 10/20/23 0752  BP: (!) 140/62 (!) 152/72 122/62 111/60  Pulse: 91 83 68 75  Resp: 19 20 16    Temp: 99.1 F (37.3 C) 99.1 F (37.3 C) 98.4 F (36.9 C) 98.4 F (36.9 C)  TempSrc: Oral Oral Oral Axillary  SpO2: 97% 92% 91% 95%  Weight:      Height:         PHYSICAL  EXAM General: Well-nourished, well-developed elderly lady, not in distress psych: Somewhat anxious but appropriate to situation CV: Regular rate and rhythm on monitor Respiratory: Respirations regular and unlabored on room air   NEURO:  Mental Status: Oriented to time, place and situation, somewhat anxious Speech/Language: speech is without dysarthria or aphasia.    Cranial Nerves:  II: PERRL.  III, IV, VI: EOMI. Eyelids elevate symmetrically.  VII: Right facial droop VIII: hearing intact to voice. IX, X: Phonation is normal.  XII: tongue is midline without fasciculations. Motor: Able to move all 4 extremities with good antigravity strength, slight drift in right arm.  Right grip is weak compared to left Tone: is normal and bulk is normal Sensation- Intact to light touch bilaterally but diminished on the right Gait- deferred   ASSESSMENT/PLAN  Intracerebral Hemorrhage:  left basal ganglia ICH Etiology: Likely hypertensive  CT head left basal ganglia IPH CTA head & neck chronic occlusion of right ICA with reconstitution up at the ophthalmic segment, chronic occlusion of proximal left ICA with weak unchanged distal reconstitution, patent vertebral artery stents Repeat CT head 12/21 at -140: Slight enlargement of left basal ganglia ICH MRI  no mass or infarct seen underlying ICH, chronic small vessel disease and remote right frontal parietal infarcts MRV  no acute abnormalities 2D Echo ejection fraction 65 to 70%  LDL 52  HgbA1c 5.6 UDS positive for THC VTE prophylaxis -lovenox clopidogrel 75 mg daily prior to admission, now on No antithrombotic secondary to ICH Therapy recommendations:  SNF Disposition: Pending  Hypertension Home meds: None Stable  off nicardipine Long term BP goal normotensive  Hyperlipidemia Home meds: Simvastatin 40 mg daily LDL 52, goal < 70 Now on crestor 10 Continue statin at discharge  Tobacco Abuse Patient smokes 1 packs per day for 40  years Smoking cessation counseling provided Nicotine patch provided Pt is willing to quit  Substance Abuse Patient uses marijuana UDS positive for  [facility]  Cessation education provided  Dysphagia Patient has post-stroke dysphagia, SLP consulted    Diet   Diet regular Room service appropriate? Yes; Fluid consistency: Thin   Advance diet as tolerated  Respiratory failure Patient intubated prior to arrival due to somnolence Ventilator management per CCM Extubated 12/21  Other Stroke Risk Factors Obesity, Body mass index is 40.26 kg/m., BMI >/= 30 associated with increased stroke risk, recommend weight loss, diet and exercise as appropriate    Other Active Problems None  Hospital day # 4  Patient seen by NP and then by MD, MD to edit note as needed. Cortney E Ernestina Columbia , MSN, AGACNP-BC [facility] See Amion for schedule and pager information 10/20/2023 11:58 AM  ATTENDING NOTE: I reviewed above note and agree with the assessment and plan. Pt was seen and examined.   RN is at the bedside. Pt lying in bed, son arrived at the end of encounter. On exam, she is awake, alert, eyes open, orientated to age, place, time and people. No aphasia, fluent language, following all simple commands. Able to name and repeat. No gaze palsy, tracking bilaterally, visual field full. No obvious facial droop. Tongue midline. Bilateral UEs 5/5, no drift. Bilaterally LEs 3/5 proximal and 4/5 distally. Sensation decreased on the RUE and diminished on the RLE, b/l FTN intact grossly, gait not tested.     For detailed assessment and plan, please refer to above/below as I have made changes wherever appropriate.   Marvel Plan, MD PhD Stroke Neurology 10/20/2023 4:06 PM     To contact Stroke Continuity provider, please refer to [email or web address]. After hours, contact General Neurology

## 2023-10-21 NOTE — Progress Notes (Signed)
PROGRESS NOTE    April [name]  ZOX:[id number] DOB: 1955-12-18 DOA: 10/16/2023 PCP: Barbie Banner, MD  Chief Complaint  Patient presents with   [facility]:  Lorenia Chainey is 67 y.o. female with GERD, hypertension, hyperlipidemia, COPD, daily tobacco abuse, who presented to [facility], ED with right-sided weakness on 12/21, CT revealed left basal ganglia subcortical hemorrhage.  Patient was transported to [facility].  Prior to transport she became increasingly somnolent and required intubation for airway protection.  She was admitted by neurology.  UDS found to be positive for THC.  Repeat head CT with minimal changes, did note surrounding edema.  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  MRI showed stable appearance of left basal ganglia hemorrhagic infarct.  Patient tolerated extubation on 12/21.  She was transferred to [facility] on 12/23.  PT/OT is recommending skilled nursing facility and she is currently awaiting placement.  Subjective: No acute events overnight. On evaluation today patient has no complaints.    Objective: Vitals:   10/20/23 2337 10/21/23 0407 10/21/23 0550 10/21/23 0750  BP: (!) 124/58 (!) 107/55  118/61  Pulse: 64 (!) 52  63  Resp: 18 20  19   Temp: 98.4 F (36.9 C) (!) 97.5 F (36.4 C)  97.6 F (36.4 C)  TempSrc: Oral Oral  Oral  SpO2: 93% 100%  96%  Weight:   103.2 kg   Height:        Intake/Output Summary (Last 24 hours) at 10/21/2023 0802 Last data filed at 10/21/2023 0407 Gross per 24 hour  Intake --  Output 950 ml  Net -950 ml   Filed Weights   10/16/23 2344 10/17/23 0437 10/21/23 0550  Weight: 107.6 kg 103.1 kg 103.2 kg    Examination: General exam: Appears calm and comfortable, NAD  Respiratory system: No work of breathing, symmetric chest wall expansion Cardiovascular system: S1 & S2 heard, RRR.  Gastrointestinal system: Abdomen is nondistended, soft and nontender.  Neuro: Alert and  oriented. No focal neurological deficits. Extremities: Symmetric, expected ROM Skin: No rashes, lesions Psychiatry:  Mood & affect appropriate for situation.   Assessment & Plan:  Principal Problem:   Hemorrhagic stroke (HCC) Active Problems:   ICH (intracerebral hemorrhage) (HCC)    Intracerebral hemorrhage of the left basal ganglia - Likely secondary to hypertension - Patient has completed extensive workup with head CT, CT neck, MRI, MRV. - No acute findings other than chronic issues as above - Echo: 65 to 70%, grade 2 diastolic dysfunction, mitral valve normal.  Aortic valve not well-visualized - LDL at goal 52 - Hemoglobin A1c 5.6% - On Plavix prior to arrival, this is since been discontinued given the ICH - Has some weakness on the left side -Continue PT/OT, SNF pending  Hypertension Heart failure with preserved EF - Required Cardene drip earlier this admission.  Blood pressure better controlled now - Continue BP management, titrate as tolerated - Was on Lasix prior to arrival, volume status stable now  History of CVA on Plavix - Management as above  Hyperlipidemia - Continue statin  Somnolence - Somnolence on arrival requiring intubation.  Thought to be secondary to Klonopin - Resume at lower dose that she is on chronic benzodiazepines at home. - Continue to monitor closely - Frequent reorientation  Acute respiratory failure - Resolved now.  Successfully extubated on 12/21. - Wean oxygen as tolerated - Continue nebs and breathing treatments as needed  COPD - Nebs as  above - Wean O2 as tolerated - Does not appear to be in exacerbation currently  Tobacco abuse - Substance abuse, UDS positive for THC - Cessation counseling  Depression - Continue home dose Effexor and trazodone - Klonopin held secondary to somnolence in ICU, at lower dose.  Recommend taper off this medication completely, this can be done outpatient  Dysphagia - SLP following  Morbid  obesity BMI 40 - Outpatient follow up for lifestyle modification and risk factor management   DVT prophylaxis: lovenox   Code Status: Limited: Do not attempt resuscitation (DNR) -DNR-LIMITED -Do Not Intubate/DNI  Family Communication: None at bedside Disposition:  Status is: Inpatient, SNF pending  Consultants:  PCCM, Neuro  Procedures:    Antimicrobials:  Anti-infectives (From admission, onward)    None       Data Reviewed: I have personally reviewed following labs and imaging studies CBC: Recent Labs  Lab 10/16/23 2145 10/17/23 0513 10/17/23 0536 10/18/23 0306  WBC 9.7 9.9  --  9.7  NEUTROABS 7.8*  --   --   --   HGB 15.3* 14.0 14.3 13.4  HCT 49.7* 45.0 42.0 43.6  MCV 90.5 88.2  --  89.3  PLT 174 167  --  143*   Basic Metabolic Panel: Recent Labs  Lab 10/16/23 2145 10/17/23 0513 10/17/23 0536 10/18/23 0306  NA 138 142 139 141  K 3.7 3.5 3.4* 3.9  CL 96* 103  --  100  CO2 33* 31  --  32  GLUCOSE 110* 103*  --  107*  BUN 10 7*  --  <5*  CREATININE 0.90 1.02*  --  0.85  CALCIUM 9.1 8.5*  --  8.5*   GFR: Estimated Creatinine Clearance: 75.7 mL/min (by C-G formula based on SCr of 0.85 mg/dL). Liver Function Tests: Recent Labs  Lab 10/16/23 2145 10/17/23 0513 10/18/23 0306  AST 13* 14* 13*  ALT 12 9 10   ALKPHOS 35* 32* 31*  BILITOT 0.5 0.6 0.6  PROT 6.8 5.6* 5.7*  ALBUMIN 3.4* 2.8* 2.8*   CBG: Recent Labs  Lab 10/18/23 1944 10/18/23 2328 10/19/23 0318 10/19/23 0808 10/19/23 1136  GLUCAP 102* 98 89 96 102*    Recent Results (from the past 240 hours)  MRSA Next Gen by PCR, Nasal     Status: None   Collection Time: 10/17/23  3:26 AM   Specimen: Nasal Mucosa; Nasal Swab  Result Value Ref Range Status   MRSA by PCR Next Gen NOT DETECTED NOT DETECTED Final    Comment: (NOTE) The GeneXpert MRSA Assay (FDA approved for NASAL specimens only), is one component of a comprehensive MRSA colonization surveillance program. It is not intended to  diagnose MRSA infection nor to guide or monitor treatment for MRSA infections. Test performance is not FDA approved in patients less than 52 years old. Performed at [facility], [address] [place], [place] [address]      Radiology Studies: No results found.  Scheduled Meds:  arformoterol  15 mcg Nebulization BID   budesonide (PULMICORT) nebulizer solution  0.5 mg Nebulization BID   clonazepam  0.25 mg Oral BID   enoxaparin (LOVENOX) injection  40 mg Subcutaneous QHS   famotidine  20 mg Oral BID   nicotine  14 mg Transdermal Daily   revefenacin  175 mcg Nebulization Daily   rosuvastatin  10 mg Oral Daily   senna-docusate  1 tablet Oral BID   traZODone  300 mg Oral QHS   venlafaxine XR  150 mg Oral Q breakfast   Continuous Infusions:   LOS: 5 days    Time spent:   Debarah Crape, DO [facility]  To contact the attending physician between 7A-7P please use Epic Chat. To contact the covering physician during after hours 7P-7A, please review Amion.   10/21/2023, 8:02 AM   *This document has been created with the assistance of dictation software. Please excuse typographical errors. *

## 2023-10-22 DIAGNOSIS — I619 Nontraumatic intracerebral hemorrhage, unspecified: Secondary | ICD-10-CM | POA: Diagnosis not present

## 2023-10-22 NOTE — TOC Progression Note (Addendum)
Transition of Care [facility]) - Progression Note    Patient Details  Name: April [name] MRN: [id number] Date of Birth: [birth year]  Transition of Care [facility]) CM/SW Contact  Baldemar Lenis, [place] Phone Number: 10/22/2023, 11:09 AM  Clinical Narrative:   CSW attempted to reach son, Jomarie Longs, to discuss SNF choice. Unable to leave a voicemail, sent a text message requesting call back. CSW to follow.  UPDATE: CSW spoke with Jomarie Longs about SNF offers. He has not fully reviewed choices yet, will finish tonight and make a choice by tomorrow morning. CSW to follow.    Expected Discharge Plan: Skilled Nursing Facility Barriers to Discharge: English as a second language teacher, SNF Pending bed offer  Expected Discharge Plan and Services In-house Referral: Clinical Social Work   Post Acute Care Choice: Skilled Nursing Facility Living arrangements for the past 2 months: Single Family Home                                       Social Determinants of Health (SDOH) Interventions SDOH Screenings   Food Insecurity: Patient Unable To Answer (10/17/2023)  Housing: Patient Unable To Answer (10/17/2023)  Transportation Needs: No Transportation Needs (10/01/2022)  Utilities: Not At Risk (10/01/2022)  Tobacco Use: High Risk (09/03/2023)   Received from [facility]    Readmission Risk Interventions     No data to display

## 2023-10-22 NOTE — Progress Notes (Signed)
Physical Therapy Treatment Patient Details Name: April [name] MRN: [id number] DOB: 05-16-1956 Today's Date: 10/22/2023   History of Present Illness Pt is a 67 y.o. female presenting 12/20 with R weakness after fall in her bathroom; down time of 1 hour. 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 Intubated 12/20-12/21 PMH significant for GERD, HTN, HLD, COPD, smoker, bil breast ca, CVA.    PT Comments  Pt resting in bed on arrival and agreeable to session with steady progress towards acute goals. Pt with improved visual attention to R throughout session, however continues to require cues to attend to R environment and UE/LE. Physically pt requiring min A to complete bed mobility with pt demonstrating fair initiation and light cues for sequencing. Pt able to come to stand x3 in stedy frame with mod A to power up from EOB and BSC and max cues to shift weight midline as pt with R lateral lean in standing. Pt returned to supine at end of session per pt request, despite education on importance of time up OOB in chair. Current plan remains appropriate to address deficits and maximize functional independence and decrease caregiver burden. Pt continues to benefit from skilled PT services to progress toward functional mobility goals.     If plan is discharge home, recommend the following: Two people to help with walking and/or transfers;Two people to help with bathing/dressing/bathroom;Direct supervision/assist for financial management;Direct supervision/assist for medications management;Help with stairs or ramp for entrance;Assist for transportation   Can travel by private vehicle     No  Equipment Recommendations  Other (comment) (to be determined at next venue)    Recommendations for Other Services       Precautions / Restrictions Precautions Precautions: Fall;Other (comment) Precaution Comments: R neglect/inattention Restrictions Weight Bearing Restrictions Per Provider Order: No      Mobility  Bed Mobility Overal bed mobility: Needs Assistance Bed Mobility: Sit to Supine, Supine to Sit     Supine to sit: Min assist Sit to supine: Min assist   General bed mobility comments: min A to come to sitting up EOB with increased time to compelte, cues to attend to R as pt leaving R UE behind when returning to supine    Transfers Overall transfer level: Needs assistance Equipment used: Rolling walker (2 wheels), Ambulation equipment used Transfers: Sit to/from Stand, Bed to chair/wheelchair/BSC Sit to Stand: Mod assist, Max assist           General transfer comment: maximal cuing for set up and placement of R hand on RW,  max A for power up into standing with pt unable to achieve with RW support as pt bil hands slipping off RW each attempt, able to come to stadn with mod A with stedy support from EOB and BSC, increased cues as pt fatigues for midline sitting and standing due to R lateral lean Transfer via Lift Equipment: Stedy  Ambulation/Gait                   Stairs             Wheelchair Mobility     Tilt Bed    Modified Rankin (Stroke Patients Only) Modified Rankin (Stroke Patients Only) Pre-Morbid Rankin Score: No symptoms Modified Rankin: Severe disability     Balance Overall balance assessment: Needs assistance Sitting-balance support: Bilateral upper extremity supported, Feet supported Sitting balance-Leahy Scale: Poor Sitting balance - Comments: continues to have minimal R lateral lean in sitting   Standing balance support: Bilateral  upper extremity supported Standing balance-Leahy Scale: Poor Standing balance comment: R lateral lean in standing                            Cognition Arousal: Alert Behavior During Therapy: Restless, Flat affect Overall Cognitive Status: No family/caregiver present to determine baseline cognitive functioning Area of Impairment: Attention, Memory, Following commands,  Safety/judgement, Awareness, Problem solving                   Current Attention Level: Focused Memory: Decreased short-term memory Following Commands: Follows one step commands inconsistently, Follows one step commands with increased time (needing up to mod cues to do so) Safety/Judgement: Decreased awareness of safety, Decreased awareness of deficits Awareness: Intellectual Problem Solving: Slow processing, Decreased initiation, Difficulty sequencing, Requires verbal cues, Requires tactile cues General Comments: requires increased cuing for task completion, very limited attention to R limited ability to purposefully use R hand for tasks like reaching up to RW        Exercises      General Comments General comments (skin integrity, edema, etc.): VSS on 2L O2      Pertinent Vitals/Pain Pain Assessment Pain Assessment: Faces Faces Pain Scale: Hurts a little bit Pain Location: generalized with movement Pain Descriptors / Indicators: Grimacing, Guarding Pain Intervention(s): Monitored during session, Limited activity within patient's tolerance, Repositioned    Home Living                          Prior Function            PT Goals (current goals can now be found in the care plan section) Acute Rehab PT Goals Patient Stated Goal: not stated PT Goal Formulation: Patient unable to participate in goal setting Time For Goal Achievement: 11/01/23 Progress towards PT goals: Progressing toward goals    Frequency    Min 1X/week      PT Plan      Co-evaluation              AM-PAC PT "6 Clicks" Mobility   Outcome Measure  Help needed turning from your back to your side while in a flat bed without using bedrails?: A Lot Help needed moving from lying on your back to sitting on the side of a flat bed without using bedrails?: Total Help needed moving to and from a bed to a chair (including a wheelchair)?: Total Help needed standing up from a chair  using your arms (e.g., wheelchair or bedside chair)?: Total Help needed to walk in hospital room?: Total Help needed climbing 3-5 steps with a railing? : Total 6 Click Score: 7    End of Session Equipment Utilized During Treatment: Oxygen Activity Tolerance: Patient limited by fatigue Patient left: in bed;with call bell/phone within reach;with bed alarm set;with SCD's reapplied Nurse Communication: Mobility status;Need for lift equipment PT Visit Diagnosis: Other abnormalities of gait and mobility (R26.89);Muscle weakness (generalized) (M62.81);Other symptoms and signs involving the nervous system (R29.898)     Time: 1478-2956 PT Time Calculation (min) (ACUTE ONLY): 28 min  Charges:    $Therapeutic Activity: 23-37 mins PT General Charges $$ ACUTE PT VISIT: 1 Visit                     Callaghan Laverdure R. PTA Acute Rehabilitation Services Office: [phone]   Catalina Antigua 10/22/2023, 12:09 PM

## 2023-10-22 NOTE — Plan of Care (Signed)
  Problem: Education: Goal: Knowledge of General Education information will improve Description: Including pain rating scale, medication(s)/side effects and non-pharmacologic comfort measures Outcome: Progressing   Problem: Health Behavior/Discharge Planning: Goal: Ability to manage health-related needs will improve Outcome: Progressing   Problem: Clinical Measurements: Goal: Ability to maintain clinical measurements within normal limits will improve Outcome: Progressing Goal: Will remain free from infection Outcome: Progressing Goal: Diagnostic test results will improve Outcome: Progressing Goal: Respiratory complications will improve Outcome: Progressing Goal: Cardiovascular complication will be avoided Outcome: Progressing   Problem: Activity: Goal: Risk for activity intolerance will decrease Outcome: Progressing   Problem: Nutrition: Goal: Adequate nutrition will be maintained Outcome: Progressing   Problem: Coping: Goal: Level of anxiety will decrease Outcome: Progressing   Problem: Elimination: Goal: Will not experience complications related to bowel motility Outcome: Progressing Goal: Will not experience complications related to urinary retention Outcome: Progressing   Problem: Pain Management: Goal: General experience of comfort will improve Outcome: Progressing   Problem: Safety: Goal: Ability to remain free from injury will improve Outcome: Progressing   Problem: Skin Integrity: Goal: Risk for impaired skin integrity will decrease Outcome: Progressing   Problem: Education: Goal: Knowledge of disease or condition will improve Outcome: Progressing Goal: Knowledge of secondary prevention will improve (MUST DOCUMENT ALL) Outcome: Progressing Goal: Knowledge of patient specific risk factors will improve Loraine Leriche N/A or DELETE if not current risk factor) Outcome: Progressing   Problem: Intracerebral Hemorrhage Tissue Perfusion: Goal: Complications of  Intracerebral Hemorrhage will be minimized Outcome: Progressing   Problem: Coping: Goal: Will verbalize positive feelings about self Outcome: Progressing Goal: Will identify appropriate support needs Outcome: Progressing   Problem: Health Behavior/Discharge Planning: Goal: Ability to manage health-related needs will improve Outcome: Progressing Goal: Goals will be collaboratively established with patient/family Outcome: Progressing   Problem: Self-Care: Goal: Ability to participate in self-care as condition permits will improve Outcome: Progressing Goal: Verbalization of feelings and concerns over difficulty with self-care will improve Outcome: Progressing Goal: Ability to communicate needs accurately will improve Outcome: Progressing   Problem: Nutrition: Goal: Risk of aspiration will decrease Outcome: Progressing Goal: Dietary intake will improve Outcome: Progressing   Problem: Education: Goal: Ability to describe self-care measures that may prevent or decrease complications (Diabetes Survival Skills Education) will improve Outcome: Progressing Goal: Individualized Educational Video(s) Outcome: Progressing   Problem: Coping: Goal: Ability to adjust to condition or change in health will improve Outcome: Progressing   Problem: Fluid Volume: Goal: Ability to maintain a balanced intake and output will improve Outcome: Progressing   Problem: Health Behavior/Discharge Planning: Goal: Ability to identify and utilize available resources and services will improve Outcome: Progressing Goal: Ability to manage health-related needs will improve Outcome: Progressing   Problem: Nutritional: Goal: Maintenance of adequate nutrition will improve Outcome: Progressing Goal: Progress toward achieving an optimal weight will improve Outcome: Progressing   Problem: Skin Integrity: Goal: Risk for impaired skin integrity will decrease Outcome: Progressing   Problem: Tissue  Perfusion: Goal: Adequacy of tissue perfusion will improve Outcome: Progressing   Problem: Activity: Goal: Ability to tolerate increased activity will improve Outcome: Progressing   Problem: Respiratory: Goal: Ability to maintain a clear airway and adequate ventilation will improve Outcome: Progressing   Problem: Role Relationship: Goal: Method of communication will improve Outcome: Progressing

## 2023-10-22 NOTE — Progress Notes (Signed)
PROGRESS NOTE  [name]:[id number] DOB: 04-Jan-1956 DOA: 10/16/2023 PCP: Barbie Banner, MD  Brief History   April [name] is 67 y.o. female with GERD, hypertension, hyperlipidemia, COPD, daily tobacco abuse, who presented to April [name], ED with right-sided weakness on 12/21, CT revealed left basal ganglia subcortical hemorrhage.  Patient was transported to [facility].  Prior to transport she became increasingly somnolent and required intubation for airway protection.  She was admitted by neurology.  UDS found to be positive for THC.  Repeat head CT with minimal changes, did note surrounding edema.  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  MRI showed stable appearance of left basal ganglia hemorrhagic infarct.  Patient tolerated extubation on 12/21.  She was transferred to [facility] on 12/23.  PT/OT is recommending skilled nursing facility and she is currently awaiting placement.   A & P  Principal Problem:   Hemorrhagic stroke (HCC) Active Problems:   ICH (intracerebral hemorrhage) (HCC)       Intracerebral hemorrhage of the left basal ganglia - Likely secondary to hypertension - Patient has completed extensive workup with head CT, CT neck, MRI, MRV. - No acute findings other than chronic issues as above - Echo: 65 to 70%, grade 2 diastolic dysfunction, mitral valve normal.  Aortic valve not well-visualized - LDL at goal 52 - Hemoglobin A1c 5.6% - On Plavix prior to arrival, this is since been discontinued given the ICH - Has some weakness on the left side -Continue PT/OT, SNF pending   Hypertension Heart failure with preserved EF - Required Cardene drip earlier this admission.  Blood pressure better controlled now - Continue BP management, titrate as tolerated - Was on Lasix prior to arrival, volume status stable now   History of CVA on Plavix - Management as above   Hyperlipidemia - Continue statin   Somnolence - Somnolence on  arrival requiring intubation.  Thought to be secondary to Klonopin - Resume at lower dose that she is on chronic benzodiazepines at home. - Continue to monitor closely - Frequent reorientation   Acute respiratory failure - Resolved now.  Successfully extubated on 12/21. - Wean oxygen as tolerated - Continue nebs and breathing treatments as needed   COPD - Nebs as above - Wean O2 as tolerated - Does not appear to be in exacerbation currently   Tobacco abuse - Substance abuse, UDS positive for THC - Cessation counseling   Depression - Continue home dose Effexor and trazodone - Klonopin held secondary to somnolence in ICU, at lower dose.  Recommend taper off this medication completely, this can be done outpatient   Dysphagia - SLP following   Morbid obesity BMI 40 - Outpatient follow up for lifestyle modification and risk factor management     DVT prophylaxis: lovenox   Code Status: Limited: Do not attempt resuscitation (DNR) -DNR-LIMITED -Do Not Intubate/DNI  Family Communication: None at bedside Disposition:  Status is: Inpatient, SNF pending   Consultants:  PCCM, Neuro   Procedures:      Antimicrobials:  Anti-infectives(From admission, onward)     I have seen and examined this patient myself. I have spent 34 minutes in her evaluation and care.  Lynnleigh Soden, DO [facility] Direct contact: see [email or web address]  7PM-7AM contact night coverage as above 10/22/2023, 2:29 PM  LOS: 6 days    Interval History/Subjective  The patient is sleeping soundly. She awakened briefly for exam, but went right back to sleep.  Objective  Vitals:  Vitals:   10/22/23 0850 10/22/23 1134  BP:  (!) 130/56  Pulse: 62 69  Resp: 16 19  Temp:  98.6 F (37 C)  SpO2:  94%    Exam:  Constitutional:  Sleeping soundly. Rouseable to voice, but went back to sleep. No acute distress. Respiratory:  CTA bilaterally, no w/r/r.  Respiratory effort normal. No retractions or  accessory muscle use Cardiovascular:  RRR, no m/r/g No LE extremity edema   Normal pedal pulses Abdomen:  Abdomen appears normal; no tenderness or masses No hernias No HSM Musculoskeletal:  Digits/nails BUE: no clubbing, cyanosis, petechiae, infection exam of joints, bones, muscles of at least one of following: head/neck, RUE, LUE, RLE, LLE   strength and tone normal, no atrophy, no abnormal movements No tenderness, masses Normal ROM, no contractures  gait and station Skin:  No rashes, lesions, ulcers palpation of skin: no induration or nodules Neurologic:  CN 2-12 intact Sensation all 4 extremities intact Psychiatric:  Mental status Mood, affect appropriate Orientation to person, place, time  judgment and insight appear intact     I have personally reviewed the following:   Today's Data   Vitals:   10/22/23 0850 10/22/23 1134  BP:  (!) 130/56  Pulse: 62 69  Resp: 16 19  Temp:  98.6 F (37 C)  SpO2:  94%     Lab Data  CBC    Component Value Date/Time   WBC 9.7 10/18/2023 0306   RBC 4.88 10/18/2023 0306   HGB 13.4 10/18/2023 0306   HGB 15.7 (H) 08/15/2022 1320   HCT 43.6 10/18/2023 0306   HCT 41.2 11/12/2015 0559   PLT 143 (L) 10/18/2023 0306   PLT 184 08/15/2022 1320   MCV 89.3 10/18/2023 0306   MCH 27.5 10/18/2023 0306   MCHC 30.7 10/18/2023 0306   RDW 14.9 10/18/2023 0306   LYMPHSABS 1.2 10/16/2023 2145   MONOABS 0.5 10/16/2023 2145   EOSABS 0.1 10/16/2023 2145   BASOSABS 0.0 10/16/2023 2145     Micro Data   Results for orders placed or performed during the hospital encounter of 10/16/23  MRSA Next Gen by PCR, Nasal     Status: None   Collection Time: 10/17/23  3:26 AM   Specimen: Nasal Mucosa; Nasal Swab  Result Value Ref Range Status   MRSA by PCR Next Gen NOT DETECTED NOT DETECTED Final    Comment: (NOTE) The GeneXpert MRSA Assay (FDA approved for NASAL specimens only), is one component of a comprehensive MRSA colonization  surveillance program. It is not intended to diagnose MRSA infection nor to guide or monitor treatment for MRSA infections. Test performance is not FDA approved in patients less than 74 years old. Performed at [facility], [address] [place], [place] [address]     Scheduled Meds:  arformoterol  15 mcg Nebulization BID   budesonide (PULMICORT) nebulizer solution  0.5 mg Nebulization BID   clonazepam  0.25 mg Oral BID   enoxaparin (LOVENOX) injection  40 mg Subcutaneous QHS   famotidine  20 mg Oral BID   nicotine  14 mg Transdermal Daily   revefenacin  175 mcg Nebulization Daily   rosuvastatin  10 mg Oral Daily   senna-docusate  1 tablet Oral BID   traZODone  300 mg Oral QHS   venlafaxine XR  150 mg Oral Q breakfast   Continuous Infusions:  promethazine (PHENERGAN) injection (IM or IVPB)      Principal Problem:   Hemorrhagic  stroke [facility]) Active Problems:   ICH (intracerebral hemorrhage) (HCC)   LOS: 6 days

## 2023-10-22 NOTE — Care Management Important Message (Signed)
Important Message  Patient Details  Name: April [name] MRN: [id number] Date of Birth: 03/08/1956   Important Message Given:  Yes - Medicare IM     [name] 10/22/2023, 3:16 PM

## 2023-10-23 DIAGNOSIS — I619 Nontraumatic intracerebral hemorrhage, unspecified: Secondary | ICD-10-CM | POA: Diagnosis not present

## 2023-10-23 LAB — COMPREHENSIVE METABOLIC PANEL
ALT: 13 U/L (ref 0–44)
AST: 15 U/L (ref 15–41)
Albumin: 2.9 g/dL — ABNORMAL LOW (ref 3.5–5.0)
Alkaline Phosphatase: 26 U/L — ABNORMAL LOW (ref 38–126)
Anion gap: 10 (ref 5–15)
BUN: 6 mg/dL — ABNORMAL LOW (ref 8–23)
CO2: 36 mmol/L — ABNORMAL HIGH (ref 22–32)
Calcium: 8.5 mg/dL — ABNORMAL LOW (ref 8.9–10.3)
Chloride: 94 mmol/L — ABNORMAL LOW (ref 98–111)
Creatinine, Ser: 0.71 mg/dL (ref 0.44–1.00)
GFR, Estimated: >60 mL/min (ref >60)
Glucose, Bld: 91 mg/dL (ref 70–99)
Potassium: 3.4 mmol/L — ABNORMAL LOW (ref 3.5–5.1)
Sodium: 140 mmol/L (ref 135–145)
Total Bilirubin: 0.5 mg/dL (ref <1.2)
Total Protein: 5.7 g/dL — ABNORMAL LOW (ref 6.5–8.1)

## 2023-10-23 LAB — CBC WITH DIFFERENTIAL/PLATELET
Abs Immature Granulocytes: 0.03 10*3/uL (ref 0.00–0.07)
Basophils Absolute: 0 10*3/uL (ref 0.0–0.1)
Basophils Relative: 0 %
Eosinophils Absolute: 0.2 10*3/uL (ref 0.0–0.5)
Eosinophils Relative: 3 %
HCT: 44.2 % (ref 36.0–46.0)
Hemoglobin: 13.4 g/dL (ref 12.0–15.0)
Immature Granulocytes: 1 %
Lymphocytes Relative: 27 %
Lymphs Abs: 1.8 10*3/uL (ref 0.7–4.0)
MCH: 27.3 pg (ref 26.0–34.0)
MCHC: 30.3 g/dL (ref 30.0–36.0)
MCV: 90.2 fL (ref 80.0–100.0)
Monocytes Absolute: 0.5 10*3/uL (ref 0.1–1.0)
Monocytes Relative: 8 %
Neutro Abs: 4 10*3/uL (ref 1.7–7.7)
Neutrophils Relative %: 61 %
Platelets: 137 10*3/uL — ABNORMAL LOW (ref 150–400)
RBC: 4.9 MIL/uL (ref 3.87–5.11)
RDW: 13.6 % (ref 11.5–15.5)
WBC: 6.5 10*3/uL (ref 4.0–10.5)
nRBC: 0 % (ref 0.0–0.2)

## 2023-10-23 NOTE — TOC Progression Note (Signed)
Transition of Care [facility]) - Progression Note    Patient Details  Name: Ayanna Katsnelson MRN: [id number] Date of Birth: 03-29-1956  Transition of Care [facility]) CM/SW Contact  Baldemar Lenis, [place] Phone Number: 10/23/2023, 4:11 PM  Clinical Narrative:   CSW spoke with son, Jomarie Longs, to discuss SNF choice. Jomarie Longs called CSW back and chose [facility]. CSW attempted to reach admissions at [facility] to check on bed availability, left a message and awaiting call back.  CSW contacted [employer] to request insurance authorization. Authorization was approved: SNF auth [id number], Theodoro Grist [phone]. CSW to follow.    Expected Discharge Plan: Skilled Nursing Facility Barriers to Discharge: Continued Medical Work up, SNF Pending bed offer  Expected Discharge Plan and Services In-house Referral: Clinical Social Work   Post Acute Care Choice: Skilled Nursing Facility Living arrangements for the past 2 months: Single Family Home                                       Social Determinants of Health (SDOH) Interventions SDOH Screenings   Food Insecurity: Patient Unable To Answer (10/17/2023)  Housing: Patient Unable To Answer (10/17/2023)  Transportation Needs: No Transportation Needs (10/01/2022)  Utilities: Not At Risk (10/01/2022)  Tobacco Use: High Risk (09/03/2023)   Received from [facility]    Readmission Risk Interventions     No data to display

## 2023-10-23 NOTE — Progress Notes (Signed)
PROGRESS NOTE  April [name] AOZ:[id number] DOB: [birth year] DOA: 10/16/2023 PCP: Barbie Banner, MD  Brief History   April [name] is 67 y.o. female with GERD, hypertension, hyperlipidemia, COPD, daily tobacco abuse, who presented to [facility], ED with right-sided weakness on 12/21, CT revealed left basal ganglia subcortical hemorrhage.  Patient was transported to [facility].  Prior to transport she became increasingly somnolent and required intubation for airway protection.  She was admitted by neurology.  UDS found to be positive for THC.  Repeat head CT with minimal changes, did note surrounding edema.  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  MRI showed stable appearance of left basal ganglia hemorrhagic infarct.  Patient tolerated extubation on 12/21.  She was transferred to [facility] on 12/23.  PT/OT is recommending CIR. She will be evaluated today.  A & P  Principal Problem:   Hemorrhagic stroke (HCC) Active Problems:   ICH (intracerebral hemorrhage) (HCC)       Intracerebral hemorrhage of the left basal ganglia - Likely secondary to hypertension - Patient has completed extensive workup with head CT, CT neck, MRI, MRV. - No acute findings other than chronic issues as above - Echo: 65 to 70%, grade 2 diastolic dysfunction, mitral valve normal.  Aortic valve not well-visualized - LDL at goal 52 - Hemoglobin A1c 5.6% - On Plavix prior to arrival, this is since been discontinued given the ICH - Has some weakness on the left side -Continue PT/OT, CIR pending   Hypertension Heart failure with preserved EF - Required Cardene drip earlier this admission.  Blood pressure better controlled now - Continue BP management, titrate as tolerated - Was on Lasix prior to arrival, volume status stable now   History of CVA on Plavix - Management as above   Hyperlipidemia - Continue statin   Somnolence - Somnolence on arrival requiring intubation.  Thought  to be secondary to Klonopin - Resume at lower dose that she is on chronic benzodiazepines at home. - Continue to monitor closely - Frequent reorientation --Increased level of awareness today.   Acute respiratory failure - Resolved now.  Successfully extubated on 12/21. - Wean oxygen as tolerated - Continue nebs and breathing treatments as needed   COPD - Nebs as above - Wean O2 as tolerated - Does not appear to be in exacerbation currently   Tobacco abuse - Substance abuse, UDS positive for THC - Cessation counseling   Depression - Continue home dose Effexor and trazodone - Klonopin held secondary to somnolence in ICU, at lower dose.  Recommend taper off this medication completely, this can be done outpatient   Dysphagia - SLP following   Morbid obesity BMI 40 - Outpatient follow up for lifestyle modification and risk factor management     DVT prophylaxis: lovenox   Code Status: Limited: Do not attempt resuscitation (DNR) -DNR-LIMITED -Do Not Intubate/DNI  Family Communication: None at bedside Disposition:  Status is: Inpatient, SNF pending   Consultants:  PCCM, Neuro    I have seen and examined this patient myself. I have spent 34 minutes in her evaluation and care.  Alexzandria Massman, DO [facility] Direct contact: see [email or web address]  7PM-7AM contact night coverage as above 10/23/2023, 2:40 PM  LOS: 7 days    Interval History/Subjective  The patient awakens easily to voice. No new complaints.   Objective   Vitals:  Vitals:   10/23/23 0824 10/23/23 1128  BP:  132/62  Pulse: 83 68  Resp:  18 18  Temp:  98.6 F (37 C)  SpO2: 97% 97%    Exam:  Constitutional:  Pt awakens to voice. She is awake, alert, and oriented x 3. No acute distress.Marland Kitchen Respiratory:  CTA bilaterally, no w/r/r.  Respiratory effort normal. No retractions or accessory muscle use Cardiovascular:  RRR, no m/r/g No LE extremity edema   Normal pedal pulses Abdomen:  Abdomen appears  normal; no tenderness or masses No hernias No HSM Musculoskeletal:  Digits/nails BUE: no clubbing, cyanosis, petechiae, infection exam of joints, bones, muscles of at least one of following: head/neck, RUE, LUE, RLE, LLE   strength and tone normal, no atrophy, no abnormal movements No tenderness, masses Normal ROM, no contractures  gait and station Skin:  No rashes, lesions, ulcers palpation of skin: no induration or nodules Neurologic:  CN 2-12 intact Sensation all 4 extremities intact Psychiatric:  Mental status Mood, affect appropriate Orientation to person, place, time  judgment and insight appear intact     I have personally reviewed the following:   Today's Data   Vitals:   10/23/23 0824 10/23/23 1128  BP:  132/62  Pulse: 83 68  Resp: 18 18  Temp:  98.6 F (37 C)  SpO2: 97% 97%     Lab Data  CBC    Component Value Date/Time   WBC 6.5 10/23/2023 0634   RBC 4.90 10/23/2023 0634   HGB 13.4 10/23/2023 0634   HGB 15.7 (H) 08/15/2022 1320   HCT 44.2 10/23/2023 0634   HCT 41.2 11/12/2015 0559   PLT 137 (L) 10/23/2023 0634   PLT 184 08/15/2022 1320   MCV 90.2 10/23/2023 0634   MCH 27.3 10/23/2023 0634   MCHC 30.3 10/23/2023 0634   RDW 13.6 10/23/2023 0634   LYMPHSABS 1.8 10/23/2023 0634   MONOABS 0.5 10/23/2023 0634   EOSABS 0.2 10/23/2023 0634   BASOSABS 0.0 10/23/2023 1308     Micro Data   Results for orders placed or performed during the hospital encounter of 10/16/23  MRSA Next Gen by PCR, Nasal     Status: None   Collection Time: 10/17/23  3:26 AM   Specimen: Nasal Mucosa; Nasal Swab  Result Value Ref Range Status   MRSA by PCR Next Gen NOT DETECTED NOT DETECTED Final    Comment: (NOTE) The GeneXpert MRSA Assay (FDA approved for NASAL specimens only), is one component of a comprehensive MRSA colonization surveillance program. It is not intended to diagnose MRSA infection nor to guide or monitor treatment for MRSA infections. Test  performance is not FDA approved in patients less than 29 years old. Performed at [facility], [address], [place], [place] [address]     Scheduled Meds:  arformoterol  15 mcg Nebulization BID   budesonide (PULMICORT) nebulizer solution  0.5 mg Nebulization BID   clonazepam  0.25 mg Oral BID   enoxaparin (LOVENOX) injection  40 mg Subcutaneous QHS   famotidine  20 mg Oral BID   nicotine  14 mg Transdermal Daily   revefenacin  175 mcg Nebulization Daily   rosuvastatin  10 mg Oral Daily   senna-docusate  1 tablet Oral BID   traZODone  300 mg Oral QHS   venlafaxine XR  150 mg Oral Q breakfast   Continuous Infusions:  promethazine (PHENERGAN) injection (IM or IVPB)      Principal Problem:   Hemorrhagic stroke (HCC) Active Problems:   ICH (intracerebral hemorrhage) (HCC)   LOS: 7 days

## 2023-10-24 DIAGNOSIS — I619 Nontraumatic intracerebral hemorrhage, unspecified: Secondary | ICD-10-CM | POA: Diagnosis not present

## 2023-10-24 MED ORDER — CLONAZEPAM 0.125 MG PO TBDP
0.2500 mg | ORAL_TABLET | Freq: Three times a day (TID) | ORAL | Status: DC | PRN
  Administered 2023-10-24 – 2023-10-26 (×5): 0.25 mg via ORAL
  Filled 2023-10-24 (×5): qty 2

## 2023-10-24 MED ORDER — CLONAZEPAM 0.125 MG PO TBDP: 0.2500 mg | ORAL_TABLET | Freq: Three times a day (TID) | ORAL | Status: DC | PRN

## 2023-10-24 NOTE — Plan of Care (Signed)
  Problem: Education: Goal: Knowledge of General Education information will improve Description: Including pain rating scale, medication(s)/side effects and non-pharmacologic comfort measures Outcome: Progressing   Problem: Health Behavior/Discharge Planning: Goal: Ability to manage health-related needs will improve Outcome: Progressing   Problem: Clinical Measurements: Goal: Ability to maintain clinical measurements within normal limits will improve Outcome: Progressing Goal: Will remain free from infection Outcome: Progressing Goal: Diagnostic test results will improve Outcome: Progressing Goal: Respiratory complications will improve Outcome: Progressing Goal: Cardiovascular complication will be avoided Outcome: Progressing   Problem: Activity: Goal: Risk for activity intolerance will decrease Outcome: Progressing   Problem: Nutrition: Goal: Adequate nutrition will be maintained Outcome: Progressing   Problem: Coping: Goal: Level of anxiety will decrease Outcome: Progressing   Problem: Elimination: Goal: Will not experience complications related to bowel motility Outcome: Progressing Goal: Will not experience complications related to urinary retention Outcome: Progressing   Problem: Pain Management: Goal: General experience of comfort will improve Outcome: Progressing   Problem: Safety: Goal: Ability to remain free from injury will improve Outcome: Progressing   Problem: Skin Integrity: Goal: Risk for impaired skin integrity will decrease Outcome: Progressing   Problem: Education: Goal: Knowledge of disease or condition will improve Outcome: Progressing Goal: Knowledge of secondary prevention will improve (MUST DOCUMENT ALL) Outcome: Progressing Goal: Knowledge of patient specific risk factors will improve Loraine Leriche N/A or DELETE if not current risk factor) Outcome: Progressing   Problem: Intracerebral Hemorrhage Tissue Perfusion: Goal: Complications of  Intracerebral Hemorrhage will be minimized Outcome: Progressing   Problem: Coping: Goal: Will verbalize positive feelings about self Outcome: Progressing Goal: Will identify appropriate support needs Outcome: Progressing   Problem: Health Behavior/Discharge Planning: Goal: Ability to manage health-related needs will improve Outcome: Progressing Goal: Goals will be collaboratively established with patient/family Outcome: Progressing   Problem: Self-Care: Goal: Ability to participate in self-care as condition permits will improve Outcome: Progressing Goal: Verbalization of feelings and concerns over difficulty with self-care will improve Outcome: Progressing Goal: Ability to communicate needs accurately will improve Outcome: Progressing   Problem: Nutrition: Goal: Risk of aspiration will decrease Outcome: Progressing Goal: Dietary intake will improve Outcome: Progressing   Problem: Education: Goal: Ability to describe self-care measures that may prevent or decrease complications (Diabetes Survival Skills Education) will improve Outcome: Progressing Goal: Individualized Educational Video(s) Outcome: Progressing   Problem: Coping: Goal: Ability to adjust to condition or change in health will improve Outcome: Progressing   Problem: Fluid Volume: Goal: Ability to maintain a balanced intake and output will improve Outcome: Progressing   Problem: Health Behavior/Discharge Planning: Goal: Ability to identify and utilize available resources and services will improve Outcome: Progressing Goal: Ability to manage health-related needs will improve Outcome: Progressing   Problem: Nutritional: Goal: Maintenance of adequate nutrition will improve Outcome: Progressing Goal: Progress toward achieving an optimal weight will improve Outcome: Progressing   Problem: Skin Integrity: Goal: Risk for impaired skin integrity will decrease Outcome: Progressing   Problem: Tissue  Perfusion: Goal: Adequacy of tissue perfusion will improve Outcome: Progressing   Problem: Activity: Goal: Ability to tolerate increased activity will improve Outcome: Progressing   Problem: Respiratory: Goal: Ability to maintain a clear airway and adequate ventilation will improve Outcome: Progressing   Problem: Role Relationship: Goal: Method of communication will improve Outcome: Progressing

## 2023-10-24 NOTE — Progress Notes (Signed)
PROGRESS NOTE  April [name] ZOX:[id number] DOB: 19-Jun-1956 DOA: 10/16/2023 PCP: Barbie Banner, MD  Brief History   April [name] is 67 y.o. female with GERD, hypertension, hyperlipidemia, COPD, daily tobacco abuse, who presented to [facility], ED with right-sided weakness on 12/21, CT revealed left basal ganglia subcortical hemorrhage.  Patient was transported to [facility] [place].  Prior to transport she became increasingly somnolent and required intubation for airway protection.  She was admitted by neurology.  UDS found to be positive for THC.  Repeat head CT with minimal changes, did note surrounding edema.  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  MRI showed stable appearance of left basal ganglia hemorrhagic infarct.  Patient tolerated extubation on 12/21.  She was transferred to [facility] on 12/23.  PT/OT is recommending SNF. Insurance authorization has been sought.  A & P  Principal Problem:   Hemorrhagic stroke (HCC) Active Problems:   ICH (intracerebral hemorrhage) (HCC)       Intracerebral hemorrhage of the left basal ganglia - Likely secondary to hypertension - Patient has completed extensive workup with head CT, CT neck, MRI, MRV. - No acute findings other than chronic issues as above - Echo: 65 to 70%, grade 2 diastolic dysfunction, mitral valve normal.  Aortic valve not well-visualized - LDL at goal 52 - Hemoglobin A1c 5.6% - On Plavix prior to arrival, this is since been discontinued given the ICH - Has some weakness on the left side -Continue PT/OT, CIR pending   Hypertension Heart failure with preserved EF - Required Cardene drip earlier this admission.  Blood pressure better controlled now - Continue BP management, titrate as tolerated - Was on Lasix prior to arrival, volume status stable now   History of CVA on Plavix - Management as above   Hyperlipidemia - Continue statin   Somnolence -- Much improved level of alertness. -  Somnolence on arrival requiring intubation.  Thought to be secondary to Klonopin - Resume at lower dose that she is on chronic benzodiazepines at home. - Continue to monitor closely - Frequent reorientation --Increased level of awareness today.   Acute respiratory failure - Resolved now.  Successfully extubated on 12/21. - Wean oxygen as tolerated - Continue nebs and breathing treatments as needed   COPD - Nebs as above - Wean O2 as tolerated - Does not appear to be in exacerbation currently   Tobacco abuse - Substance abuse, UDS positive for THC - Cessation counseling   Depression - Continue home dose Effexor and trazodone - Klonopin held secondary to somnolence in ICU, at lower dose.  Recommend taper off this medication completely, this can be done outpatient  Anxiety --The patient is complaining that she is having severe anxiety. She wants to have her Klonopin three times a day as she does at home. The order has been changed to q 8 hours as needed. The patient has been advised that she will need to ask for it.   Dysphagia - SLP following   Morbid obesity BMI 40 - Outpatient follow up for lifestyle modification and risk factor management     DVT prophylaxis: lovenox   Code Status: Limited: Do not attempt resuscitation (DNR) -DNR-LIMITED -Do Not Intubate/DNI  Family Communication: None at bedside Disposition:  Status is: Inpatient, SNF pending   Consultants:  PCCM, Neuro    I have seen and examined this patient myself. I have spent 34 minutes in her evaluation and care.  April Kazee, DO [facility]  Direct contact: see [email or web address]  7PM-7AM contact night coverage as above 10/24/2023, 3:37 PM  LOS: 8 days    Interval History/Subjective  The patient awakens easily to voice. No new complaints.   Objective   Vitals:  Vitals:   10/24/23 0753 10/24/23 1123  BP:  (!) 127/57  Pulse:  72  Resp:  17  Temp:  98.9 F (37.2 C)  SpO2: 93% 94%     Exam:  Constitutional:  Pt awakens to voice. She is awake, alert, and oriented x 3. No acute distress.Marland Kitchen Respiratory:  CTA bilaterally, no w/r/r.  Respiratory effort normal. No retractions or accessory muscle use Cardiovascular:  RRR, no m/r/g No LE extremity edema   Normal pedal pulses Abdomen:  Abdomen appears normal; no tenderness or masses No hernias No HSM Musculoskeletal:  Digits/nails BUE: no clubbing, cyanosis, petechiae, infection exam of joints, bones, muscles of at least one of following: head/neck, RUE, LUE, RLE, LLE   strength and tone normal, no atrophy, no abnormal movements No tenderness, masses Normal ROM, no contractures  gait and station Skin:  No rashes, lesions, ulcers palpation of skin: no induration or nodules Neurologic:  CN 2-12 intact Sensation all 4 extremities intact Psychiatric:  Mental status Mood, affect appropriate Orientation to person, place, time  judgment and insight appear intact     I have personally reviewed the following:   Today's Data   Vitals:   10/24/23 0753 10/24/23 1123  BP:  (!) 127/57  Pulse:  72  Resp:  17  Temp:  98.9 F (37.2 C)  SpO2: 93% 94%     Lab Data  CBC    Component Value Date/Time   WBC 6.5 10/23/2023 0634   RBC 4.90 10/23/2023 0634   HGB 13.4 10/23/2023 0634   HGB 15.7 (H) 08/15/2022 1320   HCT 44.2 10/23/2023 0634   HCT 41.2 11/12/2015 0559   PLT 137 (L) 10/23/2023 0634   PLT 184 08/15/2022 1320   MCV 90.2 10/23/2023 0634   MCH 27.3 10/23/2023 0634   MCHC 30.3 10/23/2023 0634   RDW 13.6 10/23/2023 0634   LYMPHSABS 1.8 10/23/2023 0634   MONOABS 0.5 10/23/2023 0634   EOSABS 0.2 10/23/2023 0634   BASOSABS 0.0 10/23/2023 3762     Micro Data   Results for orders placed or performed during the hospital encounter of 10/16/23  MRSA Next Gen by PCR, Nasal     Status: None   Collection Time: 10/17/23  3:26 AM   Specimen: Nasal Mucosa; Nasal Swab  Result Value Ref Range Status    MRSA by PCR Next Gen NOT DETECTED NOT DETECTED Final    Comment: (NOTE) The GeneXpert MRSA Assay (FDA approved for NASAL specimens only), is one component of a comprehensive MRSA colonization surveillance program. It is not intended to diagnose MRSA infection nor to guide or monitor treatment for MRSA infections. Test performance is not FDA approved in patients less than 56 years old. Performed at [facility], [address], [place], [place] [address]     Scheduled Meds:  arformoterol  15 mcg Nebulization BID   budesonide (PULMICORT) nebulizer solution  0.5 mg Nebulization BID   enoxaparin (LOVENOX) injection  40 mg Subcutaneous QHS   famotidine  20 mg Oral BID   nicotine  14 mg Transdermal Daily   revefenacin  175 mcg Nebulization Daily   rosuvastatin  10 mg Oral Daily   senna-docusate  1 tablet Oral BID   traZODone  300 mg Oral  QHS   venlafaxine XR  150 mg Oral Q breakfast   Continuous Infusions:  promethazine (PHENERGAN) injection (IM or IVPB)      Principal Problem:   Hemorrhagic stroke (HCC) Active Problems:   ICH (intracerebral hemorrhage) (HCC)   LOS: 8 days

## 2023-10-24 NOTE — Plan of Care (Signed)
  Problem: Clinical Measurements: Goal: Respiratory complications will improve Outcome: Progressing Goal: Cardiovascular complication will be avoided Outcome: Progressing   Problem: Activity: Goal: Risk for activity intolerance will decrease Outcome: Progressing   Problem: Nutrition: Goal: Adequate nutrition will be maintained Outcome: Progressing   Problem: Elimination: Goal: Will not experience complications related to bowel motility Outcome: Progressing Goal: Will not experience complications related to urinary retention Outcome: Progressing   Problem: Skin Integrity: Goal: Risk for impaired skin integrity will decrease Outcome: Progressing   Problem: Coping: Goal: Will verbalize positive feelings about self Outcome: Progressing Goal: Will identify appropriate support needs Outcome: Progressing

## 2023-10-25 DIAGNOSIS — I619 Nontraumatic intracerebral hemorrhage, unspecified: Secondary | ICD-10-CM | POA: Diagnosis not present

## 2023-10-25 MED ORDER — ROSUVASTATIN CALCIUM 10 MG PO TABS: 10.0000 mg | ORAL_TABLET | Freq: Every day | ORAL | Status: DC

## 2023-10-25 MED ORDER — REVEFENACIN 175 MCG/3ML IN SOLN: 175.0000 ug | Freq: Every day | RESPIRATORY_TRACT | Status: DC

## 2023-10-25 MED ORDER — SENNOSIDES-DOCUSATE SODIUM 8.6-50 MG PO TABS: 1.0000 | ORAL_TABLET | Freq: Two times a day (BID) | ORAL | Status: DC

## 2023-10-25 MED ORDER — NICOTINE 14 MG/24HR TD PT24: 14.0000 mg | MEDICATED_PATCH | Freq: Every day | TRANSDERMAL | Status: DC

## 2023-10-25 NOTE — Plan of Care (Signed)
°  Problem: Education: Goal: Knowledge of General Education information will improve Description: Including pain rating scale, medication(s)/side effects and non-pharmacologic comfort measures Outcome: Progressing   Problem: Clinical Measurements: Goal: Respiratory complications will improve Outcome: Progressing Goal: Cardiovascular complication will be avoided Outcome: Progressing   Problem: Activity: Goal: Risk for activity intolerance will decrease Outcome: Progressing   Problem: Nutrition: Goal: Adequate nutrition will be maintained Outcome: Progressing   Problem: Elimination: Goal: Will not experience complications related to bowel motility Outcome: Progressing   Problem: Skin Integrity: Goal: Risk for impaired skin integrity will decrease Outcome: Progressing   Problem: Coping: Goal: Will identify appropriate support needs Outcome: Progressing   Problem: Health Behavior/Discharge Planning: Goal: Goals will be collaboratively established with patient/family Outcome: Progressing   Problem: Nutrition: Goal: Risk of aspiration will decrease Outcome: Progressing   Problem: Skin Integrity: Goal: Risk for impaired skin integrity will decrease Outcome: Progressing

## 2023-10-25 NOTE — Plan of Care (Signed)
  Problem: Education: Goal: Knowledge of General Education information will improve Description: Including pain rating scale, medication(s)/side effects and non-pharmacologic comfort measures Outcome: Progressing   Problem: Health Behavior/Discharge Planning: Goal: Ability to manage health-related needs will improve Outcome: Progressing   Problem: Clinical Measurements: Goal: Ability to maintain clinical measurements within normal limits will improve Outcome: Progressing Goal: Will remain free from infection Outcome: Progressing Goal: Diagnostic test results will improve Outcome: Progressing Goal: Respiratory complications will improve Outcome: Progressing Goal: Cardiovascular complication will be avoided Outcome: Progressing   Problem: Activity: Goal: Risk for activity intolerance will decrease Outcome: Progressing   Problem: Nutrition: Goal: Adequate nutrition will be maintained Outcome: Progressing   Problem: Coping: Goal: Level of anxiety will decrease Outcome: Progressing   Problem: Elimination: Goal: Will not experience complications related to bowel motility Outcome: Progressing Goal: Will not experience complications related to urinary retention Outcome: Progressing   Problem: Pain Management: Goal: General experience of comfort will improve Outcome: Progressing   Problem: Safety: Goal: Ability to remain free from injury will improve Outcome: Progressing   Problem: Skin Integrity: Goal: Risk for impaired skin integrity will decrease Outcome: Progressing   Problem: Education: Goal: Knowledge of disease or condition will improve Outcome: Progressing Goal: Knowledge of secondary prevention will improve (MUST DOCUMENT ALL) Outcome: Progressing Goal: Knowledge of patient specific risk factors will improve Loraine Leriche N/A or DELETE if not current risk factor) Outcome: Progressing   Problem: Intracerebral Hemorrhage Tissue Perfusion: Goal: Complications of  Intracerebral Hemorrhage will be minimized Outcome: Progressing   Problem: Coping: Goal: Will verbalize positive feelings about self Outcome: Progressing Goal: Will identify appropriate support needs Outcome: Progressing   Problem: Health Behavior/Discharge Planning: Goal: Ability to manage health-related needs will improve Outcome: Progressing Goal: Goals will be collaboratively established with patient/family Outcome: Progressing   Problem: Self-Care: Goal: Ability to participate in self-care as condition permits will improve Outcome: Progressing Goal: Verbalization of feelings and concerns over difficulty with self-care will improve Outcome: Progressing Goal: Ability to communicate needs accurately will improve Outcome: Progressing   Problem: Nutrition: Goal: Risk of aspiration will decrease Outcome: Progressing Goal: Dietary intake will improve Outcome: Progressing   Problem: Education: Goal: Ability to describe self-care measures that may prevent or decrease complications (Diabetes Survival Skills Education) will improve Outcome: Progressing Goal: Individualized Educational Video(s) Outcome: Progressing   Problem: Coping: Goal: Ability to adjust to condition or change in health will improve Outcome: Progressing   Problem: Fluid Volume: Goal: Ability to maintain a balanced intake and output will improve Outcome: Progressing   Problem: Health Behavior/Discharge Planning: Goal: Ability to identify and utilize available resources and services will improve Outcome: Progressing Goal: Ability to manage health-related needs will improve Outcome: Progressing   Problem: Nutritional: Goal: Maintenance of adequate nutrition will improve Outcome: Progressing Goal: Progress toward achieving an optimal weight will improve Outcome: Progressing   Problem: Skin Integrity: Goal: Risk for impaired skin integrity will decrease Outcome: Progressing   Problem: Tissue  Perfusion: Goal: Adequacy of tissue perfusion will improve Outcome: Progressing   Problem: Activity: Goal: Ability to tolerate increased activity will improve Outcome: Progressing   Problem: Respiratory: Goal: Ability to maintain a clear airway and adequate ventilation will improve Outcome: Progressing   Problem: Role Relationship: Goal: Method of communication will improve Outcome: Progressing

## 2023-10-25 NOTE — Progress Notes (Signed)
PROGRESS NOTE  April [name] ZHY:[id number] DOB: [birth year] DOA: 10/16/2023 PCP: Barbie Banner, MD  Brief History   April [name] is 67 y.o. female with GERD, hypertension, hyperlipidemia, COPD, daily tobacco abuse, who presented to [facility], ED with right-sided weakness on 12/21, CT revealed left basal ganglia subcortical hemorrhage.  Patient was transported to [facility].  Prior to transport she became increasingly somnolent and required intubation for airway protection.  She was admitted by neurology.  UDS found to be positive for THC.  Repeat head CT with minimal changes, did note surrounding edema.  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  MRI showed stable appearance of left basal ganglia hemorrhagic infarct.  Patient tolerated extubation on 12/21.  She was transferred to [facility] on 12/23.  PT/OT is recommending SNF. Insurance authorization has been obtained for [employer]. She imay be admitted on Monday.  A & P  Principal Problem:   Hemorrhagic stroke (HCC) Active Problems:   ICH (intracerebral hemorrhage) (HCC)   Intracerebral hemorrhage of the left basal ganglia - Likely secondary to hypertension - Patient has completed extensive workup with head CT, CT neck, MRI, MRV. - No acute findings other than chronic issues as above - Echo: 65 to 70%, grade 2 diastolic dysfunction, mitral valve normal.  Aortic valve not well-visualized - LDL at goal 52 - Hemoglobin A1c 5.6% - On Plavix prior to arrival, this is since been discontinued given the ICH - Has some weakness on the left side -Continue PT/OT, CIR pending   Hypertension Heart failure with preserved EF - Required Cardene drip earlier this admission.  Blood pressure better controlled now - Continue BP management, titrate as tolerated - Was on Lasix prior to arrival, volume status stable now   History of CVA on Plavix - Management as above   Hyperlipidemia - Continue statin    Somnolence -- Much improved level of alertness. - Somnolence on arrival requiring intubation.  Thought to be secondary to Klonopin - Resume at lower dose that she is on chronic benzodiazepines at home. - Continue to monitor closely - Frequent reorientation --Increased level of awareness today.   Acute respiratory failure - Resolved now.  Successfully extubated on 12/21. - Wean oxygen as tolerated - Continue nebs and breathing treatments as needed   COPD - Nebs as above - Wean O2 as tolerated - Does not appear to be in exacerbation currently   Tobacco abuse - Substance abuse, UDS positive for THC - Cessation counseling   Depression - Continue home dose Effexor and trazodone - Klonopin held secondary to somnolence in ICU, at lower dose.  Recommend taper off this medication completely, this can be done outpatient  Anxiety --The patient is complaining that she is having severe anxiety. She wants to have her Klonopin three times a day as she does at home. The order has been changed to q 8 hours as needed. The patient has been advised that she will need to ask for it.   Dysphagia - SLP following   Morbid obesity BMI 40 - Outpatient follow up for lifestyle modification and risk factor management     DVT prophylaxis: lovenox   Code Status: Limited: Do not attempt resuscitation (DNR) -DNR-LIMITED -Do Not Intubate/DNI  Family Communication: None at bedside Disposition:  Status is: Inpatient, SNF pending   Consultants:  PCCM, Neuro    I have seen and examined this patient myself. I have spent 34 minutes in her evaluation and care.  Bilbo Carcamo  Therma Lasure, DO [facility] Direct contact: see [email or web address]  7PM-7AM contact night coverage as above 10/25/2023, 3:27 PM  LOS: 9 days    Interval History/Subjective  The patient awakens easily to voice. No new complaints.   Objective   Vitals:  Vitals:   10/25/23 0825 10/25/23 1140  BP: (!) 130/56 128/69  Pulse: 77 75  Resp:  18 17  Temp: 98.4 F (36.9 C) 99.1 F (37.3 C)  SpO2: 92% 95%    Exam:  Constitutional:  Pt awakens to voice. She is awake, alert, and oriented x 3. No acute distress.Marland Kitchen Respiratory:  CTA bilaterally, no w/r/r.  Respiratory effort normal. No retractions or accessory muscle use Cardiovascular:  RRR, no m/r/g No LE extremity edema   Normal pedal pulses Abdomen:  Abdomen appears normal; no tenderness or masses No hernias No HSM Musculoskeletal:  Digits/nails BUE: no clubbing, cyanosis, petechiae, infection exam of joints, bones, muscles of at least one of following: head/neck, RUE, LUE, RLE, LLE   strength and tone normal, no atrophy, no abnormal movements No tenderness, masses Normal ROM, no contractures  gait and station Skin:  No rashes, lesions, ulcers palpation of skin: no induration or nodules Neurologic:  CN 2-12 intact Sensation all 4 extremities intact Psychiatric:  Mental status Mood, affect appropriate Orientation to person, place, time  judgment and insight appear intact     I have personally reviewed the following:   Today's Data   Vitals:   10/25/23 0825 10/25/23 1140  BP: (!) 130/56 128/69  Pulse: 77 75  Resp: 18 17  Temp: 98.4 F (36.9 C) 99.1 F (37.3 C)  SpO2: 92% 95%     Lab Data  CBC    Component Value Date/Time   WBC 6.5 10/23/2023 0634   RBC 4.90 10/23/2023 0634   HGB 13.4 10/23/2023 0634   HGB 15.7 (H) 08/15/2022 1320   HCT 44.2 10/23/2023 0634   HCT 41.2 11/12/2015 0559   PLT 137 (L) 10/23/2023 0634   PLT 184 08/15/2022 1320   MCV 90.2 10/23/2023 0634   MCH 27.3 10/23/2023 0634   MCHC 30.3 10/23/2023 0634   RDW 13.6 10/23/2023 0634   LYMPHSABS 1.8 10/23/2023 0634   MONOABS 0.5 10/23/2023 0634   EOSABS 0.2 10/23/2023 0634   BASOSABS 0.0 10/23/2023 3664     Micro Data   Results for orders placed or performed during the hospital encounter of 10/16/23  MRSA Next Gen by PCR, Nasal     Status: None   Collection Time:  10/17/23  3:26 AM   Specimen: Nasal Mucosa; Nasal Swab  Result Value Ref Range Status   MRSA by PCR Next Gen NOT DETECTED NOT DETECTED Final    Comment: (NOTE) The GeneXpert MRSA Assay (FDA approved for NASAL specimens only), is one component of a comprehensive MRSA colonization surveillance program. It is not intended to diagnose MRSA infection nor to guide or monitor treatment for MRSA infections. Test performance is not FDA approved in patients less than 87 years old. Performed at [facility], [address], [place], [place] [address]     Scheduled Meds:  arformoterol  15 mcg Nebulization BID   budesonide (PULMICORT) nebulizer solution  0.5 mg Nebulization BID   enoxaparin (LOVENOX) injection  40 mg Subcutaneous QHS   famotidine  20 mg Oral BID   nicotine  14 mg Transdermal Daily   revefenacin  175 mcg Nebulization Daily   rosuvastatin  10 mg Oral Daily   senna-docusate  1  tablet Oral BID   traZODone  300 mg Oral QHS   venlafaxine XR  150 mg Oral Q breakfast   Continuous Infusions:  promethazine (PHENERGAN) injection (IM or IVPB)      Principal Problem:   Hemorrhagic stroke (HCC) Active Problems:   ICH (intracerebral hemorrhage) (HCC)   LOS: 9 days

## 2023-10-25 NOTE — TOC Progression Note (Signed)
Transition of Care [facility]) - Progression Note    Patient Details  Name: April [name] MRN: [id number] Date of Birth: 04/05/1956  Transition [facility] CM/SW Contact  Dellie Burns [place], [place] Phone Number: 10/25/2023, 9:05 AM  Clinical Narrative: spoke to [facility] at [facility] [phone] who confirmed they are prepared to admit pt beginning Monday. [employer] auth details provided to [place].   Dellie Burns, MSW, LCSW [phone] (coverage)        Expected Discharge Plan: Skilled Nursing Facility Barriers to Discharge: Continued Medical Work up, SNF Pending bed offer  Expected Discharge Plan and Services In-house Referral: Clinical Social Work   Post Acute Care Choice: Skilled Nursing Facility Living arrangements for the past 2 months: Single Family Home                                       Social Determinants of Health (SDOH) Interventions SDOH Screenings   Food Insecurity: Patient Unable To Answer (10/17/2023)  Housing: Patient Unable To Answer (10/17/2023)  Transportation Needs: No Transportation Needs (10/01/2022)  Utilities: Not At Risk (10/01/2022)  Tobacco Use: High Risk (09/03/2023)   Received from [facility]    Readmission Risk Interventions     No data to display

## 2023-10-26 DIAGNOSIS — M6281 Muscle weakness (generalized): Secondary | ICD-10-CM | POA: Insufficient documentation

## 2023-10-26 DIAGNOSIS — W19XXXA Unspecified fall, initial encounter: Secondary | ICD-10-CM | POA: Insufficient documentation

## 2023-10-26 DIAGNOSIS — I509 Heart failure, unspecified: Secondary | ICD-10-CM | POA: Insufficient documentation

## 2023-10-26 DIAGNOSIS — I619 Nontraumatic intracerebral hemorrhage, unspecified: Secondary | ICD-10-CM | POA: Diagnosis not present

## 2023-10-26 LAB — CBC WITH DIFFERENTIAL/PLATELET
Abs Immature Granulocytes: 0.04 10*3/uL (ref 0.00–0.07)
Basophils Absolute: 0 10*3/uL (ref 0.0–0.1)
Basophils Relative: 0 %
Eosinophils Absolute: 0.2 10*3/uL (ref 0.0–0.5)
Eosinophils Relative: 2 %
HCT: 44 % (ref 36.0–46.0)
Hemoglobin: 13.9 g/dL (ref 12.0–15.0)
Immature Granulocytes: 1 %
Lymphocytes Relative: 28 %
Lymphs Abs: 2 10*3/uL (ref 0.7–4.0)
MCH: 27.4 pg (ref 26.0–34.0)
MCHC: 31.6 g/dL (ref 30.0–36.0)
MCV: 86.8 fL (ref 80.0–100.0)
Monocytes Absolute: 0.5 10*3/uL (ref 0.1–1.0)
Monocytes Relative: 7 %
Neutro Abs: 4.3 10*3/uL (ref 1.7–7.7)
Neutrophils Relative %: 62 %
Platelets: 163 10*3/uL (ref 150–400)
RBC: 5.07 MIL/uL (ref 3.87–5.11)
RDW: 13.8 % (ref 11.5–15.5)
WBC: 7 10*3/uL (ref 4.0–10.5)
nRBC: 0 % (ref 0.0–0.2)

## 2023-10-26 LAB — BASIC METABOLIC PANEL
Anion gap: 11 (ref 5–15)
BUN: 9 mg/dL (ref 8–23)
CO2: 34 mmol/L — ABNORMAL HIGH (ref 22–32)
Calcium: 9.1 mg/dL (ref 8.9–10.3)
Chloride: 95 mmol/L — ABNORMAL LOW (ref 98–111)
Creatinine, Ser: 0.84 mg/dL (ref 0.44–1.00)
GFR, Estimated: >60 mL/min (ref >60)
Glucose, Bld: 90 mg/dL (ref 70–99)
Potassium: 3.7 mmol/L (ref 3.5–5.1)
Sodium: 140 mmol/L (ref 135–145)

## 2023-10-26 LAB — GLUCOSE, CAPILLARY: Glucose-Capillary: 197 mg/dL — ABNORMAL HIGH (ref 70–99)

## 2023-10-26 MED ORDER — CLONAZEPAM 0.5 MG PO TABS: 0.5000 mg | ORAL_TABLET | Freq: Three times a day (TID) | ORAL | 0 refills | Status: DC

## 2023-10-26 NOTE — Progress Notes (Signed)
Speech Language Pathology Treatment: Cognitive-Linquistic  Patient Details Name: April [name] MRN: [id number] DOB: 06/11/1956 Today's Date: 10/26/2023 Time: 2130-8657 SLP Time Calculation (min) (ACUTE ONLY): 12 min  Assessment / Plan / Recommendation Clinical Impression  Pt seen for cognitive treatment. She was sleeping but easily woken. Intellectual awareness pt stated she needs to continue to work on "my right side, strengthen my arm and leg" but did not state cognitive impairments and agreed she needed to work on them once therapist mentioned cognition. Pt aware of possible discharge today. She answered questions from sheet with 40% accuracy. She needed cues to see information on the right side of the sheet due to right inattention. Pt cued and demonstrated to use finger on the left side of the paper and scanning with her eyes to the right she was able to visualize information. For problem solving on another activity she answered with 100%. ST will continue to see while on acute care and recommend follow up at SNF.   HPI HPI: Pt is a 67 y.o. female presenting 12/20 with R weakness after fall in her bathroom; down time of 1 hour. 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 Intubated 12/20-12/21 PMH significant for GERD, HTN, HLD, COPD, smoker, marijuana use, bil breast ca, CVA.      SLP Plan  Continue with current plan of care      Recommendations for follow up therapy are one component of a multi-disciplinary discharge planning process, led by the attending physician.  Recommendations may be updated based on patient status, additional functional criteria and insurance authorization.    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

## 2023-10-26 NOTE — Discharge Summary (Signed)
Physician Discharge Summary   Patient: April [name] MRN: [id number] DOB: 1956/05/16  Admit date:     10/16/2023  Discharge date: 10/26/23  Discharge Physician: April [name]    PCP: April Banner, MD   Recommendations at discharge:    Discharge Diagnoses: Principal Problem:   Hemorrhagic stroke [facility]) Active Problems:   ICH (intracerebral hemorrhage) (HCC)  Resolved Problems:   * No resolved hospital problems. [facility] Course: 40 yof found on bathroom floor w/ right arm bent behind her reportedly she passed out and was laying on floor called her neighbor for help and EMS was called.she denied hitting head but had bruising on right arm. Later in ER reported right arm & leg  weakness since about 4pm on 20th,   CT showed left BG subcortical hemorrhage Prior to transport to [facility] became more somnolent and was intubated for airway protection. F/u CT head showed " Intraparenchymal hematoma again noted in the left basal ganglia measuring 2.3 x 2.3 x 2.2 cm compared to 2.3 x 2.3 x 1.8 cm previously".She was admitted by Neurology for likely nontraumatic left BG hemorrhage, UDS positive for THC, mental status decline in 12/21 intubated prior to transport, repeat CT head with minimal changes but some amount of surrounding edema.  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 12/21 patient extubated 12/23 transferred to floor TRH consulted> PT OT has recommended skilled nursing facility and awaiting for placement. 10/24/2023 The patient has been approved for CIR. Anticipate discharge on 10/26/2023.    DISCHARGE MEDICATION: Allergies as of 10/26/2023   No Known Allergies      Medication List     STOP taking these medications    fluticasone 50 MCG/ACT nasal spray Commonly known as: FLONASE   furosemide 20 MG tablet Commonly known as: LASIX    letrozole 2.5 MG tablet Commonly known as: FEMARA   predniSONE 20 MG tablet Commonly known as: DELTASONE   promethazine 25 MG tablet Commonly known as: PHENERGAN   simvastatin 40 MG tablet Commonly known as: ZOCOR       TAKE these medications    acetaminophen 500 MG tablet Commonly known as: TYLENOL Take 1,000 mg by mouth every 8 (eight) hours as needed for moderate pain.   albuterol 108 (90 Base) MCG/ACT inhaler Commonly known as: VENTOLIN HFA Inhale 2 puffs into the lungs every 6 (six) hours as needed for wheezing or shortness of breath.   clonazePAM 0.5 MG tablet Commonly known as: KLONOPIN Take 0.5 mg by mouth in the morning, at noon, and at bedtime.   clopidogrel 75 MG tablet Commonly known as: PLAVIX Take 75 mg by mouth daily.   montelukast 10 MG tablet Commonly known as: SINGULAIR Take 10 mg by mouth daily as needed (allergies).   nicotine 14 mg/24hr patch Commonly known as: NICODERM CQ - dosed in mg/24 hours Place 1 patch (14 mg total) onto the skin daily.   pantoprazole 40 MG tablet Commonly known as: PROTONIX Take 40 mg by mouth 2 (two) times daily.   revefenacin 175 MCG/3ML nebulizer solution Commonly known as: YUPELRI Take 3 mLs (175 mcg total) by nebulization daily.   rosuvastatin 10 MG tablet Commonly known as: CRESTOR Take 1 tablet (10 mg total) by mouth daily.   senna-docusate 8.6-50 MG tablet Commonly known as: Senokot-S Take 1 tablet by mouth 2 (two) times daily.   traZODone 150 MG tablet  Commonly known as: DESYREL Take 300 mg by mouth at bedtime.   Trelegy Ellipta 100-62.5-25 MCG/ACT Aepb Generic drug: Fluticasone-Umeclidin-Vilant TAKE 1 PUFF BY MOUTH EVERY DAY What changed: See the new instructions.   venlafaxine XR 150 MG 24 hr capsule Commonly known as: EFFEXOR-XR Take 150 mg by mouth daily with breakfast.        Follow-up Information     April Riley, MD. Schedule an appointment as soon as possible for a visit in 1  month(s).   Specialties: Neurology, Radiology Why: stroke clinic Contact information: [address] [place] [address] [phone]                Discharge Exam: Ceasar Mons Weights   10/16/23 2344 10/17/23 0437 10/21/23 0550  Weight: 107.6 kg 103.1 kg 103.2 kg   Physical Exam HENT:     Head: Normocephalic.     Mouth/Throat:     Mouth: Mucous membranes are moist.  Cardiovascular:     Rate and Rhythm: Normal rate.  Pulmonary:     Effort: Pulmonary effort is normal.  Abdominal:     Palpations: Abdomen is soft.  Skin:    General: Skin is warm.  Neurological:     Mental Status: She is alert. Mental status is at baseline.  Psychiatric:        Mood and Affect: Mood normal.      Condition at discharge: fair  The results of significant diagnostics from this hospitalization (including imaging, microbiology, ancillary and laboratory) are listed below for reference.   Imaging Studies: ECHOCARDIOGRAM COMPLETE Result Date: 10/18/2023    ECHOCARDIOGRAM REPORT   Patient Name:   April [name] Date of Exam: 10/18/2023 Medical Rec #:  [id number]     Height:       63.0 in Accession #:    [id number]    Weight:       227.3 lb Date of Birth:  1956-02-15    BSA:          2.042 m Patient Age:    67 years      BP:           108/48 mmHg Patient Gender: F             HR:           65 bpm. Exam Location:  Inpatient Procedure: 2D Echo, Cardiac Doppler and Color Doppler Indications:    Stroke l63.9  History:        Patient has prior history of Echocardiogram examinations, most                 recent 07/04/2022. CHF, TIA, COPD and Stroke; Risk                 Factors:Dyslipidemia and Hypertension. Ductal carcinoma in situ                 (DCIS) of right breast, Tobacco abuse.  Sonographer:    Celesta Gentile RCS Referring Phys: [id number] [facility] IMPRESSIONS  1. Left ventricular ejection fraction, by estimation, is 65 to 70%. The left ventricle has normal function. The left ventricle  has no regional wall motion abnormalities. There is mild left ventricular hypertrophy. Left ventricular diastolic parameters are consistent with Grade II diastolic dysfunction (pseudonormalization). Elevated left atrial pressure.  2. Right ventricular systolic function is normal. The right ventricular size is normal. Tricuspid regurgitation signal is inadequate for assessing PA pressure.  3. Left atrial size was moderately dilated.  4. The mitral valve is normal in structure. Trivial mitral valve regurgitation.  5. The aortic valve was not well visualized. Aortic valve regurgitation is not visualized. No aortic stenosis is present.  6. The inferior vena cava is dilated in size with >50% respiratory variability, suggesting right atrial pressure of 8 mmHg. FINDINGS  Left Ventricle: Left ventricular ejection fraction, by estimation, is 65 to 70%. The left ventricle has normal function. The left ventricle has no regional wall motion abnormalities. The left ventricular internal cavity size was normal in size. There is  mild left ventricular hypertrophy. Left ventricular diastolic parameters are consistent with Grade II diastolic dysfunction (pseudonormalization). Elevated left atrial pressure. Right Ventricle: The right ventricular size is normal. No increase in right ventricular wall thickness. Right ventricular systolic function is normal. Tricuspid regurgitation signal is inadequate for assessing PA pressure. Left Atrium: Left atrial size was moderately dilated. Right Atrium: Right atrial size was normal in size. Pericardium: Trivial pericardial effusion is present. Presence of epicardial fat layer. Mitral Valve: The mitral valve is normal in structure. Trivial mitral valve regurgitation. Tricuspid Valve: The tricuspid valve is normal in structure. Tricuspid valve regurgitation is trivial. Aortic Valve: The aortic valve was not well visualized. Aortic valve regurgitation is not visualized. No aortic stenosis is  present. Aortic valve mean gradient measures 8.0 mmHg. Aortic valve peak gradient measures 13.1 mmHg. Aortic valve area, by VTI measures 1.86 cm. Pulmonic Valve: The pulmonic valve was not well visualized. Pulmonic valve regurgitation is trivial. Aorta: The aortic root is normal in size and structure. Venous: The inferior vena cava is dilated in size with greater than 50% respiratory variability, suggesting right atrial pressure of 8 mmHg. IAS/Shunts: The interatrial septum was not well visualized.  LEFT VENTRICLE PLAX 2D LVIDd:         5.00 cm   Diastology LVIDs:         3.40 cm   LV e' medial:    7.40 cm/s LV PW:         1.30 cm   LV E/e' medial:  15.5 LV IVS:        1.10 cm   LV e' lateral:   9.36 cm/s LVOT diam:     1.70 cm   LV E/e' lateral: 12.3 LV SV:         77 LV SV Index:   38 LVOT Area:     2.27 cm  RIGHT VENTRICLE RV S prime:     12.90 cm/s TAPSE (M-mode): 2.4 cm LEFT ATRIUM             Index        RIGHT ATRIUM           Index LA diam:        3.00 cm 1.47 cm/m   RA Area:     16.70 cm LA Vol (A2C):   87.7 ml 42.95 ml/m  RA Volume:   47.10 ml  23.07 ml/m LA Vol (A4C):   94.7 ml 46.38 ml/m LA Biplane Vol: 93.9 ml 45.99 ml/m  AORTIC VALVE AV Area (Vmax):    1.88 cm AV Area (Vmean):   1.82 cm AV Area (VTI):     1.86 cm AV Vmax:           181.00 cm/s AV Vmean:          127.000 cm/s AV VTI:            0.415 m AV Peak Grad:  13.1 mmHg AV Mean Grad:      8.0 mmHg LVOT Vmax:         150.00 cm/s LVOT Vmean:        102.000 cm/s LVOT VTI:          0.340 m LVOT/AV VTI ratio: 0.82  AORTA Ao Root diam: 3.00 cm MITRAL VALVE MV Area (PHT): 2.47 cm     SHUNTS MV E velocity: 115.00 cm/s  Systemic VTI:  0.34 m MV A velocity: 123.50 cm/s  Systemic Diam: 1.70 cm MV E/A ratio:  0.93 Epifanio Lesches MD Electronically signed by Epifanio Lesches MD Signature Date/Time: 10/18/2023/2:40:50 PM    Final    DG Chest Port 1 View Result Date: 10/18/2023 CLINICAL DATA:  Respiratory failure. EXAM: PORTABLE  CHEST 1 VIEW COMPARISON:  10/17/2023 FINDINGS: Interval extubation and NG tube removal. Low volume film. The cardio pericardial silhouette is enlarged. There is pulmonary vascular congestion without overt pulmonary edema. No focal consolidation or pleural effusion. No acute bony abnormality. Telemetry leads overlie the chest. IMPRESSION: Interval extubation. Low volume film with pulmonary vascular congestion. Electronically Signed   By: Kennith Center M.D.   On: 10/18/2023 09:16   DG Shoulder Right Result Date: 10/17/2023 CLINICAL DATA:  Fall, pain. EXAM: RIGHT SHOULDER - 3 VIEW; RIGHT HUMERUS - 2 VIEW COMPARISON:  None Available. FINDINGS: There is no evidence of fracture or dislocation. Acromioclavicular degenerative change with narrowing and osteophyte formation. IMPRESSION: Acromioclavicular degenerative changes. No acute osseous abnormalities. Electronically Signed   By: Layla Maw M.D.   On: 10/17/2023 13:10   DG Humerus Right Result Date: 10/17/2023 CLINICAL DATA:  Fall, pain. EXAM: RIGHT SHOULDER - 3 VIEW; RIGHT HUMERUS - 2 VIEW COMPARISON:  None Available. FINDINGS: There is no evidence of fracture or dislocation. Acromioclavicular degenerative change with narrowing and osteophyte formation. IMPRESSION: Acromioclavicular degenerative changes. No acute osseous abnormalities. Electronically Signed   By: Layla Maw M.D.   On: 10/17/2023 13:10   MR BRAIN W WO CONTRAST Result Date: 10/17/2023 CLINICAL DATA:  Stroke follow-up EXAM: MRI HEAD WITHOUT AND WITH CONTRAST MRV HEAD WITHOUT CONTRAST TECHNIQUE: Multiplanar, multiecho pulse sequences of the brain and surrounding structures were obtained without and with intravenous contrast. Angiographic images of the intracranial venous structures were obtained using MRV technique without intravenous contrast. CONTRAST:  10mL GADAVIST GADOBUTROL 1 MMOL/ML IV SOLN COMPARISON:  Head CT from earlier today FINDINGS: MRI head: Known acute hemorrhage in  the left posterior limb of the internal capsule and adjacent gray matter structures with early methemoglobin seen as T1 shortening on sagittal images. There is a rim of edema which is expected. No underlying infarct or vascular lesion is detected. Patchy chronic right posterior frontal and parietal cortically based infarcts. Ischemic gliosis in the cerebral white matter with chronic lacunar infarcts in the deep watershed region. FLAIR hyperintensity in the sulcal spaces bilaterally, likely related to oxygenation in this intubated patient. No leptomeningeal enhancement or intraventricular debris. Unchanged chronic bilateral ICA occlusion with altered flow voids. There has been recent CTA. No acute finding in the sinuses or orbits. MRV: Some venous opacification preceding CTA. The left transverse and sigmoid sinus is strongly dominant. Most of right transverse and sigmoid flow is from the vein of Labbe. No stricture or occlusion is seen. Symmetric flow in the deep veins. IMPRESSION: Brain MRI: 1. No mass or infarct seen underlying the patient's ICH. 2. Chronic small vessel disease and remote right frontal parietal cortex infarcts. 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  MRV: Negative.  No explanation for the hemorrhage. Electronically Signed   By: Tiburcio Pea M.D.   On: 10/17/2023 11:14   MR Venogram Head Result Date: 10/17/2023 CLINICAL DATA:  Stroke follow-up EXAM: MRI HEAD WITHOUT AND WITH CONTRAST MRV HEAD WITHOUT CONTRAST TECHNIQUE: Multiplanar, multiecho pulse sequences of the brain and surrounding structures were obtained without and with intravenous contrast. Angiographic images of the intracranial venous structures were obtained using MRV technique without intravenous contrast. CONTRAST:  10mL GADAVIST GADOBUTROL 1 MMOL/ML IV SOLN COMPARISON:  Head CT from earlier today FINDINGS: MRI head: Known acute hemorrhage in the left posterior limb of the internal capsule and adjacent gray matter structures  with early methemoglobin seen as T1 shortening on sagittal images. There is a rim of edema which is expected. No underlying infarct or vascular lesion is detected. Patchy chronic right posterior frontal and parietal cortically based infarcts. Ischemic gliosis in the cerebral white matter with chronic lacunar infarcts in the deep watershed region. FLAIR hyperintensity in the sulcal spaces bilaterally, likely related to oxygenation in this intubated patient. No leptomeningeal enhancement or intraventricular debris. Unchanged chronic bilateral ICA occlusion with altered flow voids. There has been recent CTA. No acute finding in the sinuses or orbits. MRV: Some venous opacification preceding CTA. The left transverse and sigmoid sinus is strongly dominant. Most of right transverse and sigmoid flow is from the vein of Labbe. No stricture or occlusion is seen. Symmetric flow in the deep veins. IMPRESSION: Brain MRI: 1. No mass or infarct seen underlying the patient's ICH. 2. Chronic small vessel disease and remote right frontal parietal cortex infarcts. 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 MRV: Negative.  No explanation for the hemorrhage. Electronically Signed   By: Tiburcio Pea M.D.   On: 10/17/2023 11:14   CT Head Wo Contrast Result Date: 10/17/2023 CLINICAL DATA:  Follow-up bleed. EXAM: CT HEAD WITHOUT CONTRAST TECHNIQUE: Contiguous axial images were obtained from the base of the skull through the vertex without intravenous contrast. RADIATION DOSE REDUCTION: This exam was performed according to the departmental dose-optimization program which includes automated exposure control, adjustment of the mA and/or kV according to patient size and/or use of iterative reconstruction technique. COMPARISON:  10/16/2023 FINDINGS: Brain: Intraparenchymal hematoma again noted in the left basal ganglia measuring 2.3 x 2.3 x 2.2 cm compared to 2.3 x 2.3 x 1.8 cm previously. Small amount of surrounding edema. No significant  mass effect/midline shift. Old right frontal infarct. No new hemorrhage or hydrocephalus. Vascular: No hyperdense vessel or unexpected calcification. Skull: No acute calvarial abnormality. Sinuses/Orbits: No acute findings Other: None IMPRESSION: Left basal ganglia intracerebral hematoma measuring 2.3 x 2.3 x 2.2 cm. No mass effect or midline shift. Electronically Signed   By: Charlett Nose M.D.   On: 10/17/2023 01:46   DG Chest Portable 1 View Result Date: 10/17/2023 CLINICAL DATA:  Status post intubation. EXAM: PORTABLE CHEST 1 VIEW COMPARISON:  September 30, 2022 FINDINGS: An endotracheal tube is seen. Its distal tip is approximately 4.0 cm proximal to the carina which is limited in visualization. An enteric tube is in place with its distal end extending into the body of the stomach. The heart size and mediastinal contours are within normal limits. Low lung volumes are noted without evidence of an acute infiltrate, pleural effusion or pneumothorax. Multiple radiopaque surgical clips are seen along the lateral aspects of the chest wall. The visualized skeletal structures are unremarkable. IMPRESSION: 1. Endotracheal tube and enteric tube positioning, as described above. 2. Low  lung volumes without evidence of acute or active cardiopulmonary disease. Electronically Signed   By: Aram Candela M.D.   On: 10/17/2023 01:10   CT ANGIO HEAD NECK W WO CM Result Date: 10/16/2023 CLINICAL DATA:  Right-sided weakness EXAM: CT ANGIOGRAPHY HEAD AND NECK WITH AND WITHOUT CONTRAST TECHNIQUE: Multidetector CT imaging of the head and neck was performed using the standard protocol during bolus administration of intravenous contrast. Multiplanar CT image reconstructions and MIPs were obtained to evaluate the vascular anatomy. Carotid stenosis measurements (when applicable) are obtained utilizing NASCET criteria, using the distal internal carotid diameter as the denominator. RADIATION DOSE REDUCTION: This exam was performed  according to the departmental dose-optimization program which includes automated exposure control, adjustment of the mA and/or kV according to patient size and/or use of iterative reconstruction technique. CONTRAST:  75mL OMNIPAQUE IOHEXOL 350 MG/ML SOLN COMPARISON:  09/30/2022 head CT 08/19/2018 MRA head neck 02/27/2015 CTA head neck FINDINGS: CT HEAD FINDINGS Brain: Intraparenchymal hematoma within the left basal ganglia that measures 2.3 x 1.8 x 2.3 cm. Old right frontal infarct. No midline shift or other mass effect. Vascular: No hyperdense vessel or unexpected vascular calcification. Skull: The visualized skull base, calvarium and extracranial soft tissues are normal. Sinuses/Orbits: No fluid levels or advanced mucosal thickening of the visualized paranasal sinuses. No mastoid or middle ear effusion. Normal orbits. Critical Value/emergent results were called by telephone at the time of interpretation on 10/16/2023 at 11:06 pm to provider [facility] , who verbally acknowledged these results. CTA NECK FINDINGS Skeleton: No acute abnormality or high grade bony spinal canal stenosis. Other neck: Normal pharynx, larynx and major salivary glands. No cervical lymphadenopathy. Unremarkable thyroid gland. Upper chest: No pneumothorax or pleural effusion. No nodules or masses. Aortic arch: There is calcific atherosclerosis of the aortic arch. Conventional 3 vessel aortic branching pattern. RIGHT carotid system: There is mild atherosclerosis of the right common carotid artery. The right internal carotid artery is occluded, which is chronic. LEFT carotid system: There is multifocal mixed density atherosclerosis of the left common carotid artery. Low density plaque extends into the proximal ICA causing proximal occlusion with weak, unchanged distal reconstitution. The ICA remains diminutive to the skull base. This is also unchanged. Vertebral arteries: Left dominant configuration. There are patent bilateral vertebral  artery origins stents. CTA HEAD FINDINGS POSTERIOR CIRCULATION: Mild atherosclerosis of the distal right V4 segment. Normal left. No proximal occlusion of the anterior or inferior cerebellar arteries. Basilar artery is normal. Superior cerebellar arteries are normal. Posterior cerebral arteries are normal. ANTERIOR CIRCULATION: The right internal carotid artery is occluded with reconstitution at the ophthalmic segment, unchanged. The left ICA is severely stenotic within the proximal skull base segments with a returns to more normal caliber at the ophthalmic segment. Anterior cerebral arteries are normal. Middle cerebral arteries are normal. Venous sinuses: As permitted by contrast timing, patent. Anatomic variants: None Review of the MIP images confirms the above findings. IMPRESSION: 1. Intraparenchymal hematoma within the left basal ganglia that measures 2.3 x 1.8 x 2.3 cm. No midline shift or other mass effect. 2. 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 3. 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 4. Patent bilateral vertebral artery origin stents. Aortic Atherosclerosis (ICD10-I70.0). Critical Value/emergent results were called by telephone at the time of interpretation on 10/16/2023 at 11:06 pm to provider [facility] , who verbally acknowledged these results. Electronically Signed   By: Caryn Bee  Chase Picket M.D.   On: 10/16/2023 23:22   CT C-SPINE NO CHARGE Result Date: 10/16/2023 CLINICAL DATA:  Syncope, fall.  Neck trauma EXAM: CT CERVICAL SPINE WITHOUT CONTRAST TECHNIQUE: Multidetector CT imaging of the cervical spine was performed without intravenous contrast. Multiplanar CT image reconstructions were also generated. RADIATION DOSE REDUCTION: This exam was performed according to the departmental dose-optimization program which includes automated exposure control, adjustment of the  mA and/or kV according to patient size and/or use of iterative reconstruction technique. COMPARISON:  None Available. FINDINGS: Alignment: Normal Skull base and vertebrae: No acute fracture. No primary bone lesion or focal pathologic process. Soft tissues and spinal canal: No prevertebral fluid or swelling. No visible canal hematoma. Disc levels:  Maintained.  No disc herniation. Upper chest: Negative Other: None IMPRESSION: No acute bony abnormality. Electronically Signed   By: Charlett Nose M.D.   On: 10/16/2023 23:06    Microbiology: Results for orders placed or performed during the hospital encounter of 10/16/23  MRSA Next Gen by PCR, Nasal     Status: None   Collection Time: 10/17/23  3:26 AM   Specimen: Nasal Mucosa; Nasal Swab  Result Value Ref Range Status   MRSA by PCR Next Gen NOT DETECTED NOT DETECTED Final    Comment: (NOTE) The GeneXpert MRSA Assay (FDA approved for NASAL specimens only), is one component of a comprehensive MRSA colonization surveillance program. It is not intended to diagnose MRSA infection nor to guide or monitor treatment for MRSA infections. Test performance is not FDA approved in patients less than 51 years old. Performed at [facility], [address] [place], [place] [address]     Labs: CBC: Recent Labs  Lab 10/23/23 [phone] 10/26/23 0632  WBC 6.5 7.0  NEUTROABS 4.0 4.3  HGB 13.4 13.9  HCT 44.2 44.0  MCV 90.2 86.8  PLT 137* 163   Basic Metabolic Panel: Recent Labs  Lab 10/23/23 0634 10/26/23 0632  NA 140 140  K 3.4* 3.7  CL 94* 95*  CO2 36* 34*  GLUCOSE 91 90  BUN 6* 9  CREATININE 0.71 0.84  CALCIUM 8.5* 9.1   Liver Function Tests: Recent Labs  Lab 10/23/23 0634  AST 15  ALT 13  ALKPHOS 26*  BILITOT 0.5  PROT 5.7*  ALBUMIN 2.9*   CBG: Recent Labs  Lab 10/26/23 1131  GLUCAP 197*    Discharge time spent: greater than 30 minutes.  Signed: Baron [name] , MD [facility] 10/26/2023

## 2023-10-26 NOTE — Plan of Care (Signed)
Problem: Education: Goal: Knowledge of General Education information will improve Description: Including pain rating scale, medication(s)/side effects and non-pharmacologic comfort measures Outcome: Adequate for Discharge   Problem: Health Behavior/Discharge Planning: Goal: Ability to manage health-related needs will improve Outcome: Adequate for Discharge   Problem: Clinical Measurements: Goal: Ability to maintain clinical measurements within normal limits will improve Outcome: Adequate for Discharge Goal: Will remain free from infection Outcome: Adequate for Discharge Goal: Diagnostic test results will improve Outcome: Adequate for Discharge Goal: Respiratory complications will improve Outcome: Adequate for Discharge Goal: Cardiovascular complication will be avoided Outcome: Adequate for Discharge   Problem: Activity: Goal: Risk for activity intolerance will decrease Outcome: Adequate for Discharge   Problem: Nutrition: Goal: Adequate nutrition will be maintained Outcome: Adequate for Discharge   Problem: Coping: Goal: Level of anxiety will decrease Outcome: Adequate for Discharge   Problem: Elimination: Goal: Will not experience complications related to bowel motility Outcome: Adequate for Discharge Goal: Will not experience complications related to urinary retention Outcome: Adequate for Discharge   Problem: Pain Management: Goal: General experience of comfort will improve Outcome: Adequate for Discharge   Problem: Safety: Goal: Ability to remain free from injury will improve Outcome: Adequate for Discharge   Problem: Skin Integrity: Goal: Risk for impaired skin integrity will decrease Outcome: Adequate for Discharge   Problem: Education: Goal: Knowledge of disease or condition will improve Outcome: Adequate for Discharge Goal: Knowledge of secondary prevention will improve (MUST DOCUMENT ALL) Outcome: Adequate for Discharge Goal: Knowledge of patient  specific risk factors will improve Loraine Leriche N/A or DELETE if not current risk factor) Outcome: Adequate for Discharge   Problem: Intracerebral Hemorrhage Tissue Perfusion: Goal: Complications of Intracerebral Hemorrhage will be minimized Outcome: Adequate for Discharge   Problem: Coping: Goal: Will verbalize positive feelings about self Outcome: Adequate for Discharge Goal: Will identify appropriate support needs Outcome: Adequate for Discharge   Problem: Health Behavior/Discharge Planning: Goal: Ability to manage health-related needs will improve Outcome: Adequate for Discharge Goal: Goals will be collaboratively established with patient/family Outcome: Adequate for Discharge   Problem: Self-Care: Goal: Ability to participate in self-care as condition permits will improve Outcome: Adequate for Discharge Goal: Verbalization of feelings and concerns over difficulty with self-care will improve Outcome: Adequate for Discharge Goal: Ability to communicate needs accurately will improve Outcome: Adequate for Discharge   Problem: Nutrition: Goal: Risk of aspiration will decrease Outcome: Adequate for Discharge Goal: Dietary intake will improve Outcome: Adequate for Discharge   Problem: Education: Goal: Ability to describe self-care measures that may prevent or decrease complications (Diabetes Survival Skills Education) will improve Outcome: Adequate for Discharge Goal: Individualized Educational Video(s) Outcome: Adequate for Discharge   Problem: Coping: Goal: Ability to adjust to condition or change in health will improve Outcome: Adequate for Discharge   Problem: Fluid Volume: Goal: Ability to maintain a balanced intake and output will improve Outcome: Adequate for Discharge   Problem: Health Behavior/Discharge Planning: Goal: Ability to identify and utilize available resources and services will improve Outcome: Adequate for Discharge Goal: Ability to manage health-related  needs will improve Outcome: Adequate for Discharge   Problem: Nutritional: Goal: Maintenance of adequate nutrition will improve Outcome: Adequate for Discharge Goal: Progress toward achieving an optimal weight will improve Outcome: Adequate for Discharge   Problem: Skin Integrity: Goal: Risk for impaired skin integrity will decrease Outcome: Adequate for Discharge   Problem: Tissue Perfusion: Goal: Adequacy of tissue perfusion will improve Outcome: Adequate for Discharge   Problem: Activity: Goal: Ability to tolerate  increased activity will improve Outcome: Adequate for Discharge   Problem: Respiratory: Goal: Ability to maintain a clear airway and adequate ventilation will improve Outcome: Adequate for Discharge   Problem: Role Relationship: Goal: Method of communication will improve Outcome: Adequate for Discharge

## 2023-10-26 NOTE — Progress Notes (Signed)
Patient discharged to [facility], transferred by [facility]

## 2023-10-26 NOTE — Plan of Care (Signed)
  Problem: Education: Goal: Knowledge of General Education information will improve Description Including pain rating scale, medication(s)/side effects and non-pharmacologic comfort measures Outcome: Progressing   Problem: Health Behavior/Discharge Planning: Goal: Ability to manage health-related needs will improve Outcome: Progressing   

## 2023-10-26 NOTE — TOC Transition Note (Signed)
Transition of Care [facility]) - Discharge Note   Patient Details  Name: April [name] MRN: [id number] Date of Birth: 1956-03-03  Transition of Care [facility] - ) CM/SW Contact:  Baldemar Lenis, LCSW Phone Number: 10/26/2023, 2:03 PM   Clinical Narrative:   CSW spoke with Admissions at [facility], they are able to admit the patient today. CSW updated MD, patient is medically stable. CSW sent discharge information to [facility], confirmed receipt. CSW met with patient to provide update, she is in agreement. CSW updated son, Jomarie Longs, via phone and he is also in agreement. Transport arranged with [facility] for next available.  Nurse to call report to [phone].    Final next level of care: Skilled Nursing Facility Barriers to Discharge: Barriers Resolved   Patient Goals and CMS Choice Patient states their goals for this hospitalization and ongoing recovery are:: Pt wants to be able to return  home. CMS Medicare.gov Compare Post Acute Care list provided to:: Patient Choice offered to / list presented to : Patient      Discharge Placement              Patient chooses bed at:  [facility]) Patient to be transferred to facility by: [facility] Name of family member notified: Jomarie Longs Patient and family notified of of transfer: 10/26/23  Discharge Plan and Services Additional resources added to the After Visit Summary for   In-house Referral: Clinical Social Work   Post Acute Care Choice: Skilled Nursing Facility                               Social Drivers of Health (SDOH) Interventions SDOH Screenings   Food Insecurity: Patient Unable To Answer (10/17/2023)  Housing: Patient Unable To Answer (10/17/2023)  Transportation Needs: No Transportation Needs (10/01/2022)  Utilities: Not At Risk (10/01/2022)  Tobacco Use: High Risk (09/03/2023)   Received from [facility]     Readmission Risk Interventions     No data to display

## 2023-10-29 ENCOUNTER — Encounter: Payer: Self-pay | Admitting: Hematology and Oncology

## 2023-11-07 ENCOUNTER — Other Ambulatory Visit: Payer: Self-pay | Admitting: Hematology and Oncology

## 2023-12-10 ENCOUNTER — Encounter: Payer: Self-pay | Admitting: Hematology and Oncology

## 2023-12-29 ENCOUNTER — Inpatient Hospital Stay: Payer: PPO | Admitting: Neurology

## 2024-03-07 ENCOUNTER — Inpatient Hospital Stay: Payer: PPO | Attending: Hematology and Oncology | Admitting: Hematology and Oncology

## 2024-03-07 NOTE — Assessment & Plan Note (Deleted)
 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  Severe fatigue 2. weight gain: Discussed weight loss 3.  Hot flashes: Stable    Breast cancer surveillance: No role of mammograms since she had bilateral mastectomies. Chest wall exam 03/07/2024: Benign Hospitalization: 10/16/2023-10/26/2023: Hemorrhagic stroke  Chronic shortness of breath due to COPD: Patient uses oxygen at home.  Return to clinic in 1 year for follow-up

## 2024-04-06 DIAGNOSIS — F32A Depression, unspecified: Secondary | ICD-10-CM | POA: Insufficient documentation

## 2024-04-06 DIAGNOSIS — E785 Hyperlipidemia, unspecified: Secondary | ICD-10-CM | POA: Insufficient documentation

## 2024-04-06 DIAGNOSIS — F419 Anxiety disorder, unspecified: Secondary | ICD-10-CM | POA: Insufficient documentation

## 2024-06-01 ENCOUNTER — Encounter (HOSPITAL_COMMUNITY): Payer: Self-pay

## 2024-06-01 ENCOUNTER — Emergency Department (HOSPITAL_COMMUNITY)

## 2024-06-01 ENCOUNTER — Emergency Department (HOSPITAL_COMMUNITY)
Admission: EM | Admit: 2024-06-01 | Discharge: 2024-06-01 | Disposition: A | Discharge status: Home / Self Care | Attending: Emergency Medicine | Admitting: Emergency Medicine

## 2024-06-01 ENCOUNTER — Other Ambulatory Visit: Payer: Self-pay

## 2024-06-01 ENCOUNTER — Encounter: Payer: Self-pay | Admitting: Hematology and Oncology

## 2024-06-01 DIAGNOSIS — Y92481 Parking lot as the place of occurrence of the external cause: Secondary | ICD-10-CM | POA: Insufficient documentation

## 2024-06-01 DIAGNOSIS — S0990XA Unspecified injury of head, initial encounter: Secondary | ICD-10-CM | POA: Diagnosis not present

## 2024-06-01 DIAGNOSIS — Z853 Personal history of malignant neoplasm of breast: Secondary | ICD-10-CM | POA: Insufficient documentation

## 2024-06-01 DIAGNOSIS — I10 Essential (primary) hypertension: Secondary | ICD-10-CM | POA: Diagnosis not present

## 2024-06-01 DIAGNOSIS — S42215A Unspecified nondisplaced fracture of surgical neck of left humerus, initial encounter for closed fracture: Secondary | ICD-10-CM | POA: Insufficient documentation

## 2024-06-01 DIAGNOSIS — I6782 Cerebral ischemia: Secondary | ICD-10-CM | POA: Diagnosis not present

## 2024-06-01 DIAGNOSIS — Z7902 Long term (current) use of antithrombotics/antiplatelets: Secondary | ICD-10-CM | POA: Diagnosis not present

## 2024-06-01 DIAGNOSIS — Z7951 Long term (current) use of inhaled steroids: Secondary | ICD-10-CM | POA: Diagnosis not present

## 2024-06-01 DIAGNOSIS — W19XXXA Unspecified fall, initial encounter: Secondary | ICD-10-CM | POA: Insufficient documentation

## 2024-06-01 DIAGNOSIS — Z79899 Other long term (current) drug therapy: Secondary | ICD-10-CM | POA: Insufficient documentation

## 2024-06-01 DIAGNOSIS — J449 Chronic obstructive pulmonary disease, unspecified: Secondary | ICD-10-CM | POA: Insufficient documentation

## 2024-06-01 DIAGNOSIS — S4992XA Unspecified injury of left shoulder and upper arm, initial encounter: Secondary | ICD-10-CM | POA: Diagnosis present

## 2024-06-01 LAB — URINALYSIS, ROUTINE W REFLEX MICROSCOPIC
Bilirubin Urine: NEGATIVE
Glucose, UA: NEGATIVE mg/dL
Hgb urine dipstick: NEGATIVE
Ketones, ur: NEGATIVE mg/dL
Leukocytes,Ua: NEGATIVE
Nitrite: NEGATIVE
Protein, ur: NEGATIVE mg/dL
Specific Gravity, Urine: 1.019 (ref 1.005–1.030)
pH: 5 (ref 5.0–8.0)

## 2024-06-01 LAB — I-STAT CHEM 8, ED
BUN: 15 mg/dL (ref 8–23)
Calcium, Ion: 1.08 mmol/L — ABNORMAL LOW (ref 1.15–1.40)
Chloride: 93 mmol/L — ABNORMAL LOW (ref 98–111)
Creatinine, Ser: 0.8 mg/dL (ref 0.44–1.00)
Glucose, Bld: 107 mg/dL — ABNORMAL HIGH (ref 70–99)
HCT: 37 % (ref 36.0–46.0)
Hemoglobin: 12.6 g/dL (ref 12.0–15.0)
Potassium: 4.1 mmol/L (ref 3.5–5.1)
Sodium: 139 mmol/L (ref 135–145)
TCO2: 37 mmol/L — ABNORMAL HIGH (ref 22–32)

## 2024-06-01 LAB — CBC
HCT: 39.6 % (ref 36.0–46.0)
Hemoglobin: 11.7 g/dL — ABNORMAL LOW (ref 12.0–15.0)
MCH: 27.3 pg (ref 26.0–34.0)
MCHC: 29.5 g/dL — ABNORMAL LOW (ref 30.0–36.0)
MCV: 92.5 fL (ref 80.0–100.0)
Platelets: 157 K/uL (ref 150–400)
RBC: 4.28 MIL/uL (ref 3.87–5.11)
RDW: 13.2 % (ref 11.5–15.5)
WBC: 6.9 K/uL (ref 4.0–10.5)
nRBC: 0 % (ref 0.0–0.2)

## 2024-06-01 LAB — CBG MONITORING, ED: Glucose-Capillary: 90 mg/dL (ref 70–99)

## 2024-06-01 LAB — COMPREHENSIVE METABOLIC PANEL WITH GFR
ALT: 9 U/L (ref 0–44)
AST: 14 U/L — ABNORMAL LOW (ref 15–41)
Albumin: 3.2 g/dL — ABNORMAL LOW (ref 3.5–5.0)
Alkaline Phosphatase: 37 U/L — ABNORMAL LOW (ref 38–126)
Anion gap: 11 (ref 5–15)
BUN: 13 mg/dL (ref 8–23)
CO2: 34 mmol/L — ABNORMAL HIGH (ref 22–32)
Calcium: 8.7 mg/dL — ABNORMAL LOW (ref 8.9–10.3)
Chloride: 94 mmol/L — ABNORMAL LOW (ref 98–111)
Creatinine, Ser: 0.75 mg/dL (ref 0.44–1.00)
GFR, Estimated: >60 mL/min (ref >60)
Glucose, Bld: 108 mg/dL — ABNORMAL HIGH (ref 70–99)
Potassium: 3.7 mmol/L (ref 3.5–5.1)
Sodium: 139 mmol/L (ref 135–145)
Total Bilirubin: 0.8 mg/dL (ref 0.0–1.2)
Total Protein: 6.7 g/dL (ref 6.5–8.1)

## 2024-06-01 LAB — RAPID URINE DRUG SCREEN, HOSP PERFORMED
Amphetamines: NOT DETECTED
Barbiturates: NOT DETECTED
Benzodiazepines: NOT DETECTED
Cocaine: NOT DETECTED
Opiates: NOT DETECTED
Tetrahydrocannabinol: NOT DETECTED

## 2024-06-01 LAB — ETHANOL: Alcohol, Ethyl (B): <15 mg/dL (ref <15)

## 2024-06-01 LAB — TROPONIN I (HIGH SENSITIVITY)
Troponin I (High Sensitivity): 2 ng/L (ref <18)
Troponin I (High Sensitivity): 15 ng/L (ref <18)

## 2024-06-01 MED ORDER — OXYCODONE-ACETAMINOPHEN 5-325 MG PO TABS: 1.0000 | ORAL_TABLET | Freq: Four times a day (QID) | ORAL | 0 refills | Status: AC | PRN

## 2024-06-01 MED ORDER — IOHEXOL 350 MG/ML SOLN
75.0000 mL | Freq: Once | INTRAVENOUS | Status: AC | PRN
  Administered 2024-06-01: 75 mL via INTRAVENOUS

## 2024-06-01 MED ORDER — KETOROLAC TROMETHAMINE 15 MG/ML IJ SOLN
15.0000 mg | Freq: Once | INTRAMUSCULAR | Status: AC
  Administered 2024-06-01: 15 mg via INTRAVENOUS
  Filled 2024-06-01: qty 1

## 2024-06-01 MED ORDER — OXYCODONE-ACETAMINOPHEN 5-325 MG PO TABS
1.0000 | ORAL_TABLET | Freq: Once | ORAL | Status: AC
  Administered 2024-06-01: 1 via ORAL
  Filled 2024-06-01: qty 1

## 2024-06-01 NOTE — ED Notes (Signed)
 Went to start IV on pt and draw labs. Pt states I don't need all that bullshit, and I just had labs drawn at the PCP this morning. EDP and RN notified.

## 2024-06-01 NOTE — ED Triage Notes (Addendum)
 Pt BIB EMS from [facility] parking lot. Witnessed fall denies LOC. Pain in left arm and shoulder denies discomfort elsewhere. 2L at baseline.

## 2024-06-01 NOTE — ED Notes (Signed)
 On patient's return from CT oxygen found to be in mid 80's. Patient states she is supposed to wear 2 liters of oxygen at all times and hasn't had it since she has been here. 2 Liters of oxygen placed on patient and oxygen in the mid 90s at this time patient states this is her normal. Denies SOB.

## 2024-06-01 NOTE — Discharge Instructions (Addendum)
 Keep your left arm in sling.  Take ibuprofen and Tylenol  as needed for pain.  A prescription for narcotic pain medication was sent to your pharmacy.  Take this only as needed.  Call the telephone number below to set up a follow-up appointment with the orthopedic doctor.  You should hear from social work to arrange for evaluation for home health and physical therapy.  Return to the emergency department for any new or worsening symptoms of concern.

## 2024-06-01 NOTE — ED Provider Notes (Signed)
 [place] [facility]ERGENCY DEPARTMENT AT [facility] Provider Note   CSN: [id number] Arrival date & time: 06/01/24  8366     Patient presents with: [place]   April [name] is a 68 y.o. female.   HPI Patient presents after a fall.  Medical history includes CVA, HTN, HLD, GERD, depression, COPD, anxiety, arthritis, breast cancer, migraines.  Shortly prior to arrival, she was walking across the parking lot of a business.  She had a mechanical fall due to loss of balance.  She fell on her left side.  EMS was called to the scene.  Patient was able to stand and transfer to stretcher.  She endorses pain in proximal left arm and left shoulder.  She is on Plavix .    Prior to Admission medications   Medication Sig Start Date End Date Taking? Authorizing Provider  oxyCODONE -acetaminophen  (PERCOCET/ROXICET) 5-325 MG tablet Take 1 tablet by mouth every 6 (six) hours as needed for up to 5 days for severe pain (pain score 7-10). 06/01/24 06/06/24 Yes Melvenia Motto, MD  acetaminophen  (TYLENOL ) 500 MG tablet Take 1,000 mg by mouth every 8 (eight) hours as needed for moderate pain. Patient not taking: Reported on 10/19/2023    [provider]  albuterol  (VENTOLIN  HFA) 108 (90 Base) MCG/ACT inhaler Inhale 2 puffs into the lungs every 6 (six) hours as needed for wheezing or shortness of breath. Patient not taking: Reported on 10/19/2023    [provider]  clonazePAM  (KLONOPIN ) 0.5 MG tablet Take 1 tablet (0.5 mg total) by mouth in the morning, at noon, and at bedtime for 3 days. 10/26/23 10/29/23  [name], [name], MD  Fluticasone -Umeclidin-Vilant (TRELEGY ELLIPTA ) 100-62.5-25 MCG/ACT AEPB TAKE 1 PUFF BY MOUTH EVERY DAY Patient taking differently: Inhale 1 puff into the lungs daily. 06/12/23   Jude Harden GAILS, MD  montelukast  (SINGULAIR ) 10 MG tablet Take 10 mg by mouth daily as needed (allergies). 04/26/22   [provider]  nicotine  (NICODERM CQ  - DOSED IN MG/24 HOURS) 14 mg/24hr patch Place  1 patch (14 mg total) onto the skin daily. 10/26/23   Swayze, Ava, DO  pantoprazole  (PROTONIX ) 40 MG tablet Take 40 mg by mouth 2 (two) times daily. 02/01/15   [provider]  revefenacin  (YUPELRI ) 175 MCG/3ML nebulizer solution Take 3 mLs (175 mcg total) by nebulization daily. 10/26/23   Swayze, Ava, DO  rosuvastatin  (CRESTOR ) 10 MG tablet Take 1 tablet (10 mg total) by mouth daily. 10/26/23   Swayze, Ava, DO  senna-docusate (SENOKOT-S) 8.6-50 MG tablet Take 1 tablet by mouth 2 (two) times daily. 10/25/23   Swayze, Ava, DO  traZODone  (DESYREL ) 150 MG tablet Take 300 mg by mouth at bedtime.    [provider]  venlafaxine  XR (EFFEXOR -XR) 150 MG 24 hr capsule Take 150 mg by mouth daily with breakfast.    [provider]    Allergies: Patient has no known allergies.    Review of Systems  Cardiovascular:  Positive for chest pain.  Musculoskeletal:  Positive for arthralgias.  All other systems reviewed and are negative.   Updated Vital Signs BP 115/72   Pulse 70   Resp (!) 22   Ht 5' 3 (1.6 m)   SpO2 99%   BMI 40.30 kg/m   Physical Exam Vitals and nursing note reviewed.  Constitutional:      General: She is not in acute distress.    Appearance: Normal appearance. She is well-developed. She is not ill-appearing, toxic-appearing or diaphoretic.  HENT:  Head: Normocephalic and atraumatic.     Right Ear: External ear normal.     Left Ear: External ear normal.     Nose: Nose normal.     Mouth/Throat:     Mouth: Mucous membranes are moist.  Eyes:     Extraocular Movements: Extraocular movements intact.     Conjunctiva/sclera: Conjunctivae normal.  Cardiovascular:     Rate and Rhythm: Normal rate and regular rhythm.  Pulmonary:     Effort: Pulmonary effort is normal. No respiratory distress.  Abdominal:     General: There is no distension.     Palpations: Abdomen is soft.     Tenderness: There is no abdominal tenderness.  Musculoskeletal:         General: Tenderness and signs of injury present. No swelling.     Cervical back: Normal range of motion and neck supple.  Skin:    General: Skin is warm and dry.     Coloration: Skin is not jaundiced or pale.  Neurological:     General: No focal deficit present.     Mental Status: She is alert and oriented to person, place, and time.  Psychiatric:        Mood and Affect: Mood normal.        Behavior: Behavior normal.     (all labs ordered are listed, but only abnormal results are displayed) Labs Reviewed  COMPREHENSIVE METABOLIC PANEL WITH GFR - Abnormal; Notable for the following components:      Result Value   Chloride 94 (*)    CO2 34 (*)    Glucose, Bld 108 (*)    Calcium  8.7 (*)    Albumin 3.2 (*)    AST 14 (*)    Alkaline Phosphatase 37 (*)    All other components within normal limits  CBC - Abnormal; Notable for the following components:   Hemoglobin 11.7 (*)    MCHC 29.5 (*)    All other components within normal limits  I-STAT CHEM 8, ED - Abnormal; Notable for the following components:   Chloride 93 (*)    Glucose, Bld 107 (*)    Calcium , Ion 1.08 (*)    TCO2 37 (*)    All other components within normal limits  ETHANOL  URINALYSIS, ROUTINE W REFLEX MICROSCOPIC  RAPID URINE DRUG SCREEN, HOSP PERFORMED  CBG MONITORING, ED  TROPONIN I (HIGH SENSITIVITY)  TROPONIN I (HIGH SENSITIVITY)    EKG: EKG Interpretation Date/Time:  Wednesday June 01 2024 16:46:32 EDT Ventricular Rate:  75 PR Interval:  127 QRS Duration:  86 QT Interval:  426 QTC Calculation: 476 R Axis:   48  Text Interpretation: Ectopic atrial rhythm Low voltage, precordial leads Confirmed by Melvenia Motto [phone]) on 06/01/2024 6:12:32 PM  Radiology: CT Angio Chest PE W and/or Wo Contrast Result Date: 06/01/2024 CLINICAL DATA:  PE suspected. Witnessed fall. Pain in left arm and shoulder. EXAM: CT ANGIOGRAPHY CHEST WITH CONTRAST TECHNIQUE: Multidetector CT imaging of the chest was performed using the  standard protocol during bolus administration of intravenous contrast. Multiplanar CT image reconstructions and MIPs were obtained to evaluate the vascular anatomy. RADIATION DOSE REDUCTION: This exam was performed according to the departmental dose-optimization program which includes automated exposure control, adjustment of the mA and/or kV according to patient size and/or use of iterative reconstruction technique. CONTRAST:  75mL OMNIPAQUE  IOHEXOL  350 MG/ML SOLN COMPARISON:  Same day chest radiograph; CTA chest September 30, 2022 FINDINGS: Cardiovascular: No pericardial effusion. Normal caliber thoracic aorta  without dissection. Coronary artery and aortic atherosclerotic calcification. Negative for acute pulmonary embolism. Mediastinum/Nodes: Trachea and esophagus are unremarkable. No thoracic adenopathy. Lungs/Pleura: Bibasilar atelectasis/scarring. Otherwise no focal consolidation. No pleural effusion or pneumothorax. Upper Abdomen: No acute abnormality. Musculoskeletal: Acute comminuted fracture of the surgical neck of the left humerus. Review of the MIP images confirms the above findings. IMPRESSION: 1. Negative for acute pulmonary embolism. 2. Acute comminuted fracture of the surgical neck of the left humerus. 3. Aortic Atherosclerosis (ICD10-I70.0). Electronically Signed   By: Norman Gatlin M.D.   On: 06/01/2024 19:36   CT CERVICAL SPINE WO CONTRAST Result Date: 06/01/2024 CLINICAL DATA:  Witnessed fall, trauma EXAM: CT CERVICAL SPINE WITHOUT CONTRAST TECHNIQUE: Multidetector CT imaging of the cervical spine was performed without intravenous contrast. Multiplanar CT image reconstructions were also generated. RADIATION DOSE REDUCTION: This exam was performed according to the departmental dose-optimization program which includes automated exposure control, adjustment of the mA and/or kV according to patient size and/or use of iterative reconstruction technique. COMPARISON:  10/16/2023 FINDINGS: Alignment:  Alignment is grossly anatomic. Skull base and vertebrae: No acute fracture. No primary bone lesion or focal pathologic process. Soft tissues and spinal canal: No prevertebral fluid or swelling. No visible canal hematoma. Disc levels: Stable mild multilevel spondylosis and facet hypertrophy, with disc space narrowing most pronounced at the C6-7 level. Upper chest: Airway is patent. Visualized portions of the lung apices are clear. Stable stent at the origin of the left vertebral artery. Other: Reconstructed images demonstrate no additional findings. IMPRESSION: 1. No acute cervical spine fracture. Electronically Signed   By: Ozell Daring M.D.   On: 06/01/2024 19:31   CT HEAD WO CONTRAST Result Date: 06/01/2024 CLINICAL DATA:  Moderate to severe head trauma, witnessed fall EXAM: CT HEAD WITHOUT CONTRAST TECHNIQUE: Contiguous axial images were obtained from the base of the skull through the vertex without intravenous contrast. RADIATION DOSE REDUCTION: This exam was performed according to the departmental dose-optimization program which includes automated exposure control, adjustment of the mA and/or kV according to patient size and/or use of iterative reconstruction technique. COMPARISON:  10/17/2023 FINDINGS: Brain: No evidence of acute infarct or hemorrhage. Chronic ischemic changes are seen within the right frontal and parietal regions, stable. Lateral ventricles and midline structures are unremarkable. No acute extra-axial fluid collections. No mass effect. Vascular: No hyperdense vessel or unexpected calcification. Skull: Normal. Negative for fracture or focal lesion. Sinuses/Orbits: No acute finding. Other: None. IMPRESSION: 1. No acute intracranial process. Chronic ischemic changes as above. Electronically Signed   By: Ozell Daring M.D.   On: 06/01/2024 19:29   DG Chest Port 1 View Result Date: 06/01/2024 CLINICAL DATA:  Trauma, fall EXAM: PORTABLE CHEST - 1 VIEW COMPARISON:  October 18, 2023 FINDINGS:  Lower lung volumes with bronchovascular crowding. No focal airspace consolidation, pleural effusion, or pneumothorax. Mild cardiomegaly. Aortic atherosclerosis. No acute fracture or destructive lesions. Multilevel thoracic osteophytosis. Bilateral surgical clips in the breast or axilla. IMPRESSION: Low lung volumes.  Otherwise, no acute cardiopulmonary abnormality. Electronically Signed   By: Rogelia Myers M.D.   On: 06/01/2024 19:16   DG Pelvis Portable Result Date: 06/01/2024 CLINICAL DATA:  Trauma, fall EXAM: PORTABLE PELVIS 1-2 VIEWS COMPARISON:  None Available. FINDINGS: No evidence of pelvic fracture or diastasis.No acute hip fracture or dislocation.Multilevel degenerative disc disease of the spine.Soft tissues are unremarkable. IMPRESSION: No acute fracture, pelvic bone diastasis, or dislocation. Electronically Signed   By: Rogelia Myers M.D.   On: 06/01/2024 19:15  DG Elbow Complete Left Result Date: 06/01/2024 CLINICAL DATA:  Blunt Trauma EXAM: LEFT ELBOW - COMPLETE 3+ VIEW COMPARISON:  None Available. FINDINGS: No acute, displaced fracture or dislocation. Suboptimal evaluation for joint effusion due to patient positioning. There is no evidence of arthropathy or other focal bone abnormality. Soft tissues are unremarkable. IMPRESSION: Suboptimal evaluation for joint effusion due to patient positioning. Otherwise, no acute, displaced fracture or dislocation. Repeat radiographs of the elbow with optimal positioning recommended to assess for joint effusion and occult fracture. Electronically Signed   By: Rogelia Myers M.D.   On: 06/01/2024 19:14   DG Shoulder Left Port Result Date: 06/01/2024 CLINICAL DATA:  Blunt Trauma EXAM: LEFT SHOULDER COMPARISON:  None Available. FINDINGS: Osteopenia.Comminuted, mildly displaced fracture of the proximal humerus, centered at the surgical neck, and involving the greater tuberosity. No glenohumeral joint dislocation. There is no evidence of arthropathy or other  focal bone abnormality. Axillary surgical clips. IMPRESSION: Comminuted, mildly displaced proximal left humeral fracture. Electronically Signed   By: Rogelia Myers M.D.   On: 06/01/2024 19:10     Procedures   Medications Ordered in the ED  oxyCODONE -acetaminophen  (PERCOCET/ROXICET) 5-325 MG per tablet 1 tablet (1 tablet Oral Given 06/01/24 1704)  iohexol  (OMNIPAQUE ) 350 MG/ML injection 75 mL (75 mLs Intravenous Contrast Given 06/01/24 1855)  oxyCODONE -acetaminophen  (PERCOCET/ROXICET) 5-325 MG per tablet 1 tablet (1 tablet Oral Given 06/01/24 2124)  ketorolac  (TORADOL ) 15 MG/ML injection 15 mg (15 mg Intravenous Given 06/01/24 2124)                                    Medical Decision Making Amount and/or Complexity of Data Reviewed Labs: ordered. Radiology: ordered.  Risk Prescription drug management.   This patient presents to the ED for concern of fall, this involves an extensive number of treatment options, and is a complaint that carries with it a high risk of complications and morbidity.  The differential diagnosis includes acute injuries   Co morbidities / Chronic conditions that complicate the patient evaluation  CVA, HTN, HLD, GERD, depression, COPD, anxiety, arthritis, breast cancer, migraines   Additional history obtained:  Additional history obtained from EMR External records from outside source obtained and reviewed including patient's sister   Lab Tests:  I Ordered, and personally interpreted labs.  The pertinent results include: Normal hemoglobin, no leukocytosis, normal kidney pressure, normal electrolytes   Imaging Studies ordered:  I ordered imaging studies including x-ray of left shoulder, left elbow, chest, pelvis; CT scan of head, cervical spine, chest I independently visualized and interpreted imaging which showed acute fracture of the proximal left humerus.  No other acute findings. I agree with the radiologist interpretation   Cardiac Monitoring: /  EKG:  The patient was maintained on a cardiac monitor.  I personally viewed and interpreted the cardiac monitored which showed an underlying rhythm of: Sinus rhythm   Problem List / ED Course / Critical interventions / Medication management  Patient presents after mechanical fall.  On arrival to the ED, she is alert and oriented.  She endorses pain in her proximal left arm.  Tenderness is present to this area.  She has left-sided chest tenderness as well.  She is not sure if she struck her head.  She is on Plavix .  Percocet was ordered for analgesia.  Workup was initiated.  Patient's lab work was unremarkable.  Imaging studies were notable for proximal left humerus fracture.  Sling was ordered.  On reassessment, patient resting comfortably.  Percocet and Toradol  were ordered for ongoing analgesia.  Patient states that she typically walks without cane or walker.  She does have some baseline with sided weakness from prior stroke.  Her sister has been working on getting her some home health support.  Will place face-to-face orders from the ED.  Patient to follow-up with orthopedic surgery as outpatient.  She was discharged in stable condition. I ordered medication including Percocet and Toradol  for analgesia Reevaluation of the patient after these medicines showed that the patient improved I have reviewed the patients home medicines and have made adjustments as needed  Social Determinants of Health:  Lives independently     Final diagnoses:  Fall, initial encounter  Unspecified nondisplaced fracture of surgical neck of left humerus, initial encounter for closed fracture    ED Discharge Orders          Ordered    oxyCODONE -acetaminophen  (PERCOCET/ROXICET) 5-325 MG tablet  Every 6 hours PRN        06/01/24 2127               Melvenia Motto, MD 06/01/24 2129

## 2024-06-02 ENCOUNTER — Other Ambulatory Visit: Payer: Self-pay

## 2024-06-02 NOTE — Progress Notes (Signed)
 Please let patient know that her thyroid  lab is now normal and to continue current thyroid  medication.  Call for any questions/concerns.

## 2024-06-03 ENCOUNTER — Other Ambulatory Visit: Payer: Self-pay

## 2024-06-04 ENCOUNTER — Other Ambulatory Visit: Payer: Self-pay

## 2024-06-07 ENCOUNTER — Ambulatory Visit: Admitting: Orthopedic Surgery

## 2024-06-08 ENCOUNTER — Encounter (HOSPITAL_COMMUNITY): Payer: Self-pay

## 2024-06-08 ENCOUNTER — Other Ambulatory Visit: Payer: Self-pay

## 2024-06-08 ENCOUNTER — Emergency Department (HOSPITAL_COMMUNITY)

## 2024-06-08 ENCOUNTER — Observation Stay (HOSPITAL_COMMUNITY)
Admission: EM | Admit: 2024-06-08 | Discharge: 2024-06-10 | Disposition: A | Discharge status: Skilled Nursing Facility | Attending: Family Medicine | Admitting: Family Medicine

## 2024-06-08 DIAGNOSIS — J9612 Chronic respiratory failure with hypercapnia: Secondary | ICD-10-CM | POA: Diagnosis not present

## 2024-06-08 DIAGNOSIS — I110 Hypertensive heart disease with heart failure: Secondary | ICD-10-CM | POA: Diagnosis not present

## 2024-06-08 DIAGNOSIS — R531 Weakness: Secondary | ICD-10-CM | POA: Diagnosis present

## 2024-06-08 DIAGNOSIS — D649 Anemia, unspecified: Secondary | ICD-10-CM | POA: Diagnosis present

## 2024-06-08 DIAGNOSIS — F32A Depression, unspecified: Secondary | ICD-10-CM | POA: Diagnosis present

## 2024-06-08 DIAGNOSIS — J9611 Chronic respiratory failure with hypoxia: Secondary | ICD-10-CM | POA: Diagnosis not present

## 2024-06-08 DIAGNOSIS — E039 Hypothyroidism, unspecified: Secondary | ICD-10-CM | POA: Diagnosis not present

## 2024-06-08 DIAGNOSIS — I5032 Chronic diastolic (congestive) heart failure: Secondary | ICD-10-CM | POA: Diagnosis present

## 2024-06-08 DIAGNOSIS — S86912A Strain of unspecified muscle(s) and tendon(s) at lower leg level, left leg, initial encounter: Principal | ICD-10-CM

## 2024-06-08 DIAGNOSIS — S42202A Unspecified fracture of upper end of left humerus, initial encounter for closed fracture: Secondary | ICD-10-CM | POA: Diagnosis present

## 2024-06-08 DIAGNOSIS — S42202D Unspecified fracture of upper end of left humerus, subsequent encounter for fracture with routine healing: Secondary | ICD-10-CM | POA: Diagnosis not present

## 2024-06-08 DIAGNOSIS — R54 Age-related physical debility: Secondary | ICD-10-CM | POA: Diagnosis not present

## 2024-06-08 DIAGNOSIS — R5381 Other malaise: Secondary | ICD-10-CM | POA: Diagnosis present

## 2024-06-08 DIAGNOSIS — W19XXXD Unspecified fall, subsequent encounter: Secondary | ICD-10-CM | POA: Insufficient documentation

## 2024-06-08 DIAGNOSIS — B889 Infestation, unspecified: Secondary | ICD-10-CM | POA: Insufficient documentation

## 2024-06-08 DIAGNOSIS — R296 Repeated falls: Secondary | ICD-10-CM

## 2024-06-08 DIAGNOSIS — J449 Chronic obstructive pulmonary disease, unspecified: Secondary | ICD-10-CM | POA: Diagnosis present

## 2024-06-08 DIAGNOSIS — Z8673 Personal history of transient ischemic attack (TIA), and cerebral infarction without residual deficits: Secondary | ICD-10-CM

## 2024-06-08 DIAGNOSIS — F419 Anxiety disorder, unspecified: Secondary | ICD-10-CM | POA: Insufficient documentation

## 2024-06-08 LAB — CBC
HCT: 32 % — ABNORMAL LOW (ref 36.0–46.0)
Hemoglobin: 9.5 g/dL — ABNORMAL LOW (ref 12.0–15.0)
MCH: 27.8 pg (ref 26.0–34.0)
MCHC: 29.7 g/dL — ABNORMAL LOW (ref 30.0–36.0)
MCV: 93.6 fL (ref 80.0–100.0)
Platelets: 188 K/uL (ref 150–400)
RBC: 3.42 MIL/uL — ABNORMAL LOW (ref 3.87–5.11)
RDW: 13.2 % (ref 11.5–15.5)
WBC: 5.4 K/uL (ref 4.0–10.5)
nRBC: 0 % (ref 0.0–0.2)

## 2024-06-08 LAB — COMPREHENSIVE METABOLIC PANEL WITH GFR
ALT: 9 U/L (ref 0–44)
AST: 11 U/L — ABNORMAL LOW (ref 15–41)
Albumin: 2.4 g/dL — ABNORMAL LOW (ref 3.5–5.0)
Alkaline Phosphatase: 33 U/L — ABNORMAL LOW (ref 38–126)
Anion gap: 9 (ref 5–15)
BUN: 12 mg/dL (ref 8–23)
CO2: 40 mmol/L — ABNORMAL HIGH (ref 22–32)
Calcium: 8.1 mg/dL — ABNORMAL LOW (ref 8.9–10.3)
Chloride: 93 mmol/L — ABNORMAL LOW (ref 98–111)
Creatinine, Ser: 0.69 mg/dL (ref 0.44–1.00)
GFR, Estimated: >60 mL/min (ref >60)
Glucose, Bld: 119 mg/dL — ABNORMAL HIGH (ref 70–99)
Potassium: 3.6 mmol/L (ref 3.5–5.1)
Sodium: 142 mmol/L (ref 135–145)
Total Bilirubin: 0.6 mg/dL (ref 0.0–1.2)
Total Protein: 5.7 g/dL — ABNORMAL LOW (ref 6.5–8.1)

## 2024-06-08 LAB — CK: Total CK: 44 U/L (ref 38–234)

## 2024-06-08 NOTE — ED Notes (Signed)
 Upon cleaning patient she was able to move leg without difficulty

## 2024-06-08 NOTE — ED Triage Notes (Addendum)
 [facility] from home. Cc of weakness. Was here on 8/6 for a fall. Said she fell on her right knee and since then she hasn't been able to walk. Had a CVA in December and left with right side deficits.  Has been laying in her waste on couch for 2 days. So she called ems.  Chronic bilateral leg weakness before cva. Wears 3l  at baseline Also of note patient has bedbugs in the home per ems.

## 2024-06-08 NOTE — ED Provider Notes (Signed)
 [place] EMERGENCY DEPARTMENT AT [facility] Provider Note   CSN: [id number] Arrival date & time: 06/08/24  2045     Patient presents with: Leg Pain   April [name] is a 68 y.o. female.    Leg Pain  This patient is a 68 year old female, she is on clopidogrel  after having a stroke in December, she presents to the hospital after having a fall approximately 1 week ago where she landed on her arm and broke her left proximal humerus, she was placed in a sling and discharged home however because of her prior stroke giving her right sided deficits she has had difficulty getting around the house and had another fall.  She was barely able to crawl onto the couch where she has been for the last 2 days sitting in her own excrement.  The patient denies having any other pains except for her left shoulder and her right knee, the right knee occurred during her last fall.  She is unable to bear any weight on it, eventually she called the paramedics because she could not get off of the couch, lives by herself and has no family around.  Paramedics note the patient to be hypotensive.    Prior to Admission medications   Medication Sig Start Date End Date Taking? Authorizing Provider  acetaminophen  (TYLENOL ) 500 MG tablet Take 1,000 mg by mouth every 8 (eight) hours as needed for moderate pain. Patient not taking: Reported on 10/19/2023    [provider]  albuterol  (VENTOLIN  HFA) 108 (90 Base) MCG/ACT inhaler Inhale 2 puffs into the lungs every 6 (six) hours as needed for wheezing or shortness of breath. Patient not taking: Reported on 10/19/2023    [provider]  clonazePAM  (KLONOPIN ) 0.5 MG tablet Take 1 tablet (0.5 mg total) by mouth in the morning, at noon, and at bedtime for 3 days. 10/26/23 10/29/23  [name], [name], MD  clopidogrel  (PLAVIX ) 75 MG tablet TAKE ONE TABLET DAILY FOR STROKE PREVENTION 10/27/23   [provider]  Fluticasone -Umeclidin-Vilant (TRELEGY  ELLIPTA) 100-62.5-25 MCG/ACT AEPB TAKE 1 PUFF BY MOUTH EVERY DAY Patient taking differently: Inhale 1 puff into the lungs daily. 06/12/23   Jude Harden GAILS, MD  furosemide (LASIX) 20 MG tablet TAKE ONE TABLET DAILY AS NEEDED FOR FLUID RETENTION 03/30/24   [provider]  gabapentin  (NEURONTIN ) 300 MG capsule Take 300 mg by mouth 3 (three) times daily.    [provider]  montelukast  (SINGULAIR ) 10 MG tablet Take 10 mg by mouth daily as needed (allergies). 04/26/22   [provider]  nicotine  (NICODERM CQ  - DOSED IN MG/24 HOURS) 14 mg/24hr patch Place 1 patch (14 mg total) onto the skin daily. 10/26/23   Swayze, Ava, DO  pantoprazole  (PROTONIX ) 40 MG tablet Take 40 mg by mouth 2 (two) times daily. 02/01/15   [provider]  revefenacin  (YUPELRI ) 175 MCG/3ML nebulizer solution Take 3 mLs (175 mcg total) by nebulization daily. 10/26/23   Swayze, Ava, DO  rosuvastatin  (CRESTOR ) 10 MG tablet Take 1 tablet (10 mg total) by mouth daily. 10/26/23   Swayze, Ava, DO  senna-docusate (SENOKOT-S) 8.6-50 MG tablet Take 1 tablet by mouth 2 (two) times daily. 10/25/23   Swayze, Ava, DO  traZODone  (DESYREL ) 150 MG tablet Take 300 mg by mouth at bedtime.    [provider]  venlafaxine  XR (EFFEXOR -XR) 150 MG 24 hr capsule Take 150 mg by mouth daily with breakfast.    [provider]    Allergies:  Patient has no known allergies.    Review of Systems  All other systems reviewed and are negative.   Updated Vital Signs BP 92/74   Pulse (!) 55   Resp 20   Ht 1.6 m (5' 3)   Wt 103.2 kg   SpO2 95%   BMI 40.30 kg/m   Physical Exam Vitals and nursing note reviewed.  Constitutional:      General: She is not in acute distress.    Appearance: She is well-developed.  HENT:     Head: Normocephalic and atraumatic.     Mouth/Throat:     Pharynx: No oropharyngeal exudate.  Eyes:     General: No scleral icterus.       Right eye: No discharge.        Left eye: No  discharge.     Conjunctiva/sclera: Conjunctivae normal.     Pupils: Pupils are equal, round, and reactive to light.  Neck:     Thyroid : No thyromegaly.     Vascular: No JVD.  Cardiovascular:     Rate and Rhythm: Normal rate and regular rhythm.     Heart sounds: Normal heart sounds. No murmur heard.    No friction rub. No gallop.  Pulmonary:     Effort: Pulmonary effort is normal. No respiratory distress.     Breath sounds: Normal breath sounds. No wheezing or rales.  Abdominal:     General: Bowel sounds are normal. There is no distension.     Palpations: Abdomen is soft. There is no mass.     Tenderness: There is no abdominal tenderness.  Musculoskeletal:        General: Swelling, tenderness and deformity present.     Cervical back: Normal range of motion and neck supple.     Right lower leg: No edema.     Left lower leg: No edema.     Comments: Able to straight leg raise bilaterally although weakly on the right, 10 range of motion of right knee but with severe pain, there is mild effusion of the right knee joint.  Left proximal arm has bruising consistent with prior proximal humeral fracture.  Lymphadenopathy:     Cervical: No cervical adenopathy.  Skin:    General: Skin is warm and dry.     Findings: No erythema or rash.  Neurological:     Mental Status: She is alert.     Coordination: Coordination normal.  Psychiatric:        Behavior: Behavior normal.     (all labs ordered are listed, but only abnormal results are displayed) Labs Reviewed  CK  CBC  COMPREHENSIVE METABOLIC PANEL WITH GFR    EKG: None  Radiology: No results found.   Procedures   Medications Ordered in the ED - No data to display                                  Medical Decision Making Amount and/or Complexity of Data Reviewed Labs: ordered. Radiology: ordered. ECG/medicine tests: ordered.  Risk Decision regarding hospitalization.   Patient has had prior breast cancer, mastectomy  scars are reviewed and without erythema   This patient presents to the ED for concern of fall and weakness, this involves an extensive number of treatment options, and is a complaint that carries with it a high risk of complications and morbidity.  The differential diagnosis includes possible injury to the right knee, the  patient certainly has some failure to thrive, obesity, she appears dehydrated, she has not been able to get off the couch in 2 days lives by herself and has right sided deficits and now with a left arm fracture that is making it very difficult for her to get around   Co morbidities / Chronic conditions that complicate the patient evaluation  Prior stroke   Additional history obtained:  Additional history obtained from EMR External records from outside source obtained and reviewed including medical record including prior imaging showing humeral fracture   Lab Tests:  I Ordered, and personally interpreted labs.  The pertinent results include: CBC with a progressive anemia, metabolic panel overall shows no renal function abnormalities, low albumin, consistent with a chronic malnutrition   Imaging Studies ordered:  I ordered imaging studies including x-ray of the knee on the right side I independently visualized and interpreted imaging which showed does not appear to be any fractures or dislocations but there is an effusion I agree with the radiologist interpretation   Cardiac Monitoring: / EKG:  The patient was maintained on a cardiac monitor.  I personally viewed and interpreted the cardiac monitored which showed an underlying rhythm of: Normal sinus rhythm   Problem List / ED Course / Critical interventions / Medication management  This patient clearly has a new anemia, the cause is not exactly clear but will likely need to be worked up for this.  She has been progressively weak since her fall and with her left proximal humerus fracture and now the right knee  injury and her prior stroke affecting her right side I do not think that it is safe to discharge home.  She will need to be admitted.  I did discuss her care with Dr. Charlton who has been kind enough to admit this patient to the hospital  I have reviewed the patients home medicines and have made adjustments as needed   Consultations Obtained:  I requested consultation with the hospitalist,  and discussed lab and imaging findings as well as pertinent plan - they recommend: Admission   Social Determinants of Health:  Chronically ill, lives by herself, prior stroke   Test / Admission - Considered:  Admit to hospital      Final diagnoses:  Knee strain, left, initial encounter  Falls  Weakness    ED Discharge Orders     None          Cleotilde Rogue, MD 06/09/24 1241

## 2024-06-09 ENCOUNTER — Ambulatory Visit: Admitting: Orthopaedic Surgery

## 2024-06-09 ENCOUNTER — Encounter (HOSPITAL_COMMUNITY): Payer: Self-pay | Admitting: Family Medicine

## 2024-06-09 DIAGNOSIS — R5381 Other malaise: Secondary | ICD-10-CM | POA: Diagnosis not present

## 2024-06-09 DIAGNOSIS — E039 Hypothyroidism, unspecified: Secondary | ICD-10-CM | POA: Diagnosis not present

## 2024-06-09 DIAGNOSIS — F419 Anxiety disorder, unspecified: Secondary | ICD-10-CM | POA: Diagnosis not present

## 2024-06-09 DIAGNOSIS — F32A Depression, unspecified: Secondary | ICD-10-CM

## 2024-06-09 DIAGNOSIS — D649 Anemia, unspecified: Secondary | ICD-10-CM | POA: Diagnosis present

## 2024-06-09 DIAGNOSIS — S42202A Unspecified fracture of upper end of left humerus, initial encounter for closed fracture: Secondary | ICD-10-CM | POA: Diagnosis present

## 2024-06-09 DIAGNOSIS — J410 Simple chronic bronchitis: Secondary | ICD-10-CM

## 2024-06-09 DIAGNOSIS — Z91199 Patient's noncompliance with other medical treatment and regimen due to unspecified reason: Secondary | ICD-10-CM

## 2024-06-09 DIAGNOSIS — R54 Age-related physical debility: Secondary | ICD-10-CM | POA: Diagnosis not present

## 2024-06-09 LAB — VITAMIN B12: Vitamin B-12: 269 pg/mL (ref 180–914)

## 2024-06-09 LAB — RETICULOCYTES
Immature Retic Fract: 27.8 % — ABNORMAL HIGH (ref 2.3–15.9)
RBC.: 3.41 MIL/uL — ABNORMAL LOW (ref 3.87–5.11)
Retic Count, Absolute: 101.3 K/uL (ref 19.0–186.0)
Retic Ct Pct: 3 % (ref 0.4–3.1)

## 2024-06-09 LAB — TSH: TSH: 19.875 u[IU]/mL — ABNORMAL HIGH (ref 0.350–4.500)

## 2024-06-09 LAB — BASIC METABOLIC PANEL WITH GFR
Anion gap: 10 (ref 5–15)
BUN: 10 mg/dL (ref 8–23)
CO2: 36 mmol/L — ABNORMAL HIGH (ref 22–32)
Calcium: 8.1 mg/dL — ABNORMAL LOW (ref 8.9–10.3)
Chloride: 93 mmol/L — ABNORMAL LOW (ref 98–111)
Creatinine, Ser: 0.67 mg/dL (ref 0.44–1.00)
GFR, Estimated: >60 mL/min (ref >60)
Glucose, Bld: 101 mg/dL — ABNORMAL HIGH (ref 70–99)
Potassium: 3.7 mmol/L (ref 3.5–5.1)
Sodium: 139 mmol/L (ref 135–145)

## 2024-06-09 LAB — IRON AND TIBC
Iron: 34 ug/dL (ref 28–170)
Saturation Ratios: 17 % (ref 10.4–31.8)
TIBC: 202 ug/dL — ABNORMAL LOW (ref 250–450)
UIBC: 168 ug/dL

## 2024-06-09 LAB — CBC
HCT: 32.1 % — ABNORMAL LOW (ref 36.0–46.0)
Hemoglobin: 9.8 g/dL — ABNORMAL LOW (ref 12.0–15.0)
MCH: 28.4 pg (ref 26.0–34.0)
MCHC: 30.5 g/dL (ref 30.0–36.0)
MCV: 93 fL (ref 80.0–100.0)
Platelets: 176 K/uL (ref 150–400)
RBC: 3.45 MIL/uL — ABNORMAL LOW (ref 3.87–5.11)
RDW: 13.2 % (ref 11.5–15.5)
WBC: 5.7 K/uL (ref 4.0–10.5)
nRBC: 0 % (ref 0.0–0.2)

## 2024-06-09 LAB — FOLATE: Folate: 6.3 ng/mL (ref >5.9)

## 2024-06-09 LAB — FERRITIN: Ferritin: 122 ng/mL (ref 11–307)

## 2024-06-09 LAB — T4, FREE: Free T4: 0.71 ng/dL (ref 0.61–1.12)

## 2024-06-09 LAB — HIV ANTIBODY (ROUTINE TESTING W REFLEX): HIV Screen 4th Generation wRfx: NONREACTIVE

## 2024-06-09 LAB — PHOSPHORUS: Phosphorus: 3.4 mg/dL (ref 2.5–4.6)

## 2024-06-09 LAB — AMMONIA: Ammonia: 28 umol/L (ref 9–35)

## 2024-06-09 LAB — MAGNESIUM: Magnesium: 2.3 mg/dL (ref 1.7–2.4)

## 2024-06-09 MED ORDER — ACETAMINOPHEN 325 MG PO TABS
650.0000 mg | ORAL_TABLET | Freq: Four times a day (QID) | ORAL | Status: DC | PRN
  Administered 2024-06-09: 650 mg via ORAL
  Filled 2024-06-09: qty 2

## 2024-06-09 MED ORDER — ONDANSETRON HCL 4 MG/2ML IJ SOLN: 4.0000 mg | Freq: Four times a day (QID) | INTRAMUSCULAR | Status: DC | PRN

## 2024-06-09 MED ORDER — OXYCODONE HCL 5 MG PO TABS
5.0000 mg | ORAL_TABLET | ORAL | Status: DC | PRN
  Administered 2024-06-09 – 2024-06-10 (×7): 10 mg via ORAL
  Filled 2024-06-09 (×7): qty 2

## 2024-06-09 MED ORDER — TRAZODONE HCL 50 MG PO TABS
300.0000 mg | ORAL_TABLET | Freq: Every day | ORAL | Status: DC
  Administered 2024-06-09: 300 mg via ORAL
  Filled 2024-06-09: qty 6

## 2024-06-09 MED ORDER — CLONAZEPAM 0.5 MG PO TABS
0.5000 mg | ORAL_TABLET | Freq: Three times a day (TID) | ORAL | Status: DC | PRN
  Administered 2024-06-09: 0.5 mg via ORAL
  Filled 2024-06-09: qty 1

## 2024-06-09 MED ORDER — CLOPIDOGREL BISULFATE 75 MG PO TABS
75.0000 mg | ORAL_TABLET | Freq: Every day | ORAL | Status: DC
  Administered 2024-06-09 – 2024-06-10 (×2): 75 mg via ORAL
  Filled 2024-06-09 (×2): qty 1

## 2024-06-09 MED ORDER — VENLAFAXINE HCL ER 75 MG PO CP24
150.0000 mg | ORAL_CAPSULE | Freq: Every day | ORAL | Status: DC
  Administered 2024-06-09 – 2024-06-10 (×2): 150 mg via ORAL
  Filled 2024-06-09 (×2): qty 2

## 2024-06-09 MED ORDER — SIMVASTATIN 20 MG PO TABS
40.0000 mg | ORAL_TABLET | Freq: Every day | ORAL | Status: DC
  Administered 2024-06-09 – 2024-06-10 (×2): 40 mg via ORAL
  Filled 2024-06-09 (×2): qty 2

## 2024-06-09 MED ORDER — POLYETHYLENE GLYCOL 3350 17 G PO PACK: 17.0000 g | PACK | Freq: Every day | ORAL | Status: DC | PRN

## 2024-06-09 MED ORDER — IPRATROPIUM-ALBUTEROL 0.5-2.5 (3) MG/3ML IN SOLN: 3.0000 mL | Freq: Four times a day (QID) | RESPIRATORY_TRACT | Status: DC | PRN

## 2024-06-09 MED ORDER — ONDANSETRON HCL 4 MG PO TABS: 4.0000 mg | ORAL_TABLET | Freq: Four times a day (QID) | ORAL | Status: DC | PRN

## 2024-06-09 MED ORDER — BUDESON-GLYCOPYRROL-FORMOTEROL 160-9-4.8 MCG/ACT IN AERO
2.0000 | INHALATION_SPRAY | Freq: Two times a day (BID) | RESPIRATORY_TRACT | Status: DC
  Administered 2024-06-09 – 2024-06-10 (×3): 2 via RESPIRATORY_TRACT
  Filled 2024-06-09: qty 5.9

## 2024-06-09 MED ORDER — PANTOPRAZOLE SODIUM 40 MG PO TBEC
40.0000 mg | DELAYED_RELEASE_TABLET | Freq: Two times a day (BID) | ORAL | Status: DC
  Administered 2024-06-09 – 2024-06-10 (×3): 40 mg via ORAL
  Filled 2024-06-09 (×3): qty 1

## 2024-06-09 MED ORDER — ENOXAPARIN SODIUM 60 MG/0.6ML IJ SOSY
50.0000 mg | PREFILLED_SYRINGE | INTRAMUSCULAR | Status: DC
  Administered 2024-06-09 – 2024-06-10 (×2): 50 mg via SUBCUTANEOUS
  Filled 2024-06-09 (×2): qty 0.6

## 2024-06-09 MED ORDER — ACETAMINOPHEN 650 MG RE SUPP: 650.0000 mg | Freq: Four times a day (QID) | RECTAL | Status: DC | PRN

## 2024-06-09 NOTE — Plan of Care (Signed)
  Problem: Acute Rehab OT Goals (only OT should resolve) Goal: Pt. Will Perform Eating Flowsheets (Taken 06/09/2024 1231) Pt Will Perform Eating:  with modified independence  sitting Goal: Pt. Will Perform Grooming Flowsheets (Taken 06/09/2024 1231) Pt Will Perform Grooming:  with modified independence  sitting Goal: Pt. Will Perform Upper Body Dressing Flowsheets (Taken 06/09/2024 1231) Pt Will Perform Upper Body Dressing:  with modified independence  sitting Goal: Pt. Will Perform Lower Body Dressing Flowsheets (Taken 06/09/2024 1231) Pt Will Perform Lower Body Dressing:  sitting/lateral leans  with adaptive equipment  with set-up Goal: Pt. Will Transfer To Toilet Flowsheets (Taken 06/09/2024 1231) Pt Will Transfer to Toilet:  with modified independence  ambulating Goal: Pt. Will Perform Toileting-Clothing Manipulation Flowsheets (Taken 06/09/2024 1231) Pt Will Perform Toileting - Clothing Manipulation and hygiene:  with modified independence  sit to/from stand  sitting/lateral leans  with adaptive equipment Goal: Pt/Caregiver Will Perform Home Exercise Program Flowsheets (Taken 06/09/2024 1231) Pt/caregiver will Perform Home Exercise Program:  Increased ROM  Increased strength  Right Upper extremity  Left upper extremity  With minimal assist  Viren Lebeau OT, MOT

## 2024-06-09 NOTE — Plan of Care (Signed)

## 2024-06-09 NOTE — Plan of Care (Signed)
  Problem: Acute Rehab PT Goals(only PT should resolve) Goal: Pt Will Go Supine/Side To Sit Outcome: Progressing Flowsheets (Taken 06/09/2024 1415) Pt will go Supine/Side to Sit: with minimal assist Goal: Patient Will Transfer Sit To/From Stand Outcome: Progressing Flowsheets (Taken 06/09/2024 1415) Patient will transfer sit to/from stand: with minimal assist Goal: Pt Will Transfer Bed To Chair/Chair To Bed Outcome: Progressing Flowsheets (Taken 06/09/2024 1415) Pt will Transfer Bed to Chair/Chair to Bed: with min assist Goal: Pt Will Ambulate Outcome: Progressing Flowsheets (Taken 06/09/2024 1415) Pt will Ambulate:  with minimal assist  25 feet  15 feet  with rolling walker

## 2024-06-09 NOTE — Hospital Course (Signed)
 68 y.o. female with medical history significant for COPD, chronic hypoxic and hypercarbic respiratory failure, history of CVA, depression, anxiety, chronic HFpEF, BMI 38, and recent proximal left humerus fracture who presents with generalized weakness.   Patient has chronic right-sided motor deficits from a stroke, suffered a proximal left humerus fracture on 06/01/2024, and has had difficulty performing her ADLs since then.  She reports progressive generalized weakness and has been too weak to get off of her couch where she has remained for the past 2 days.  She denies any new focal numbness or weakness, denies chest pain or palpitations, denies fever or chills, and denies any change in her chronic respiratory symptoms.  She has ongoing left shoulder pain, and has also been experiencing some right knee pain after a recent fall.   ED Course: Upon arrival to the ED, patient is found to be afebrile and saturating mid 90s on 3 L/min supplemental oxygen with SBP in the low 90s.  Labs are most notable for bicarbonate 40, normal creatinine, albumin 2.4, normal WBC, normal serum CK, and hemoglobin 9.5.

## 2024-06-09 NOTE — H&P (Signed)
 History and Physical    April [name] FMW:[id number] DOB: 12-08-1955 DOA: 06/08/2024  PCP: Tanda Prentice DEL, MD   Patient coming from: Home   Chief Complaint: Generalized weakness   HPI: April [name] is a 68 y.o. female with medical history significant for COPD, chronic hypoxic and hypercarbic respiratory failure, history of CVA, depression, anxiety, chronic HFpEF, BMI 38, and recent proximal left humerus fracture who presents with generalized weakness.  Patient has chronic right-sided motor deficits from a stroke, suffered a proximal left humerus fracture on 06/01/2024, and has had difficulty performing her ADLs since then.  She reports progressive generalized weakness and has been too weak to get off of her couch where she has remained for the past 2 days.  She denies any new focal numbness or weakness, denies chest pain or palpitations, denies fever or chills, and denies any change in her chronic respiratory symptoms.  She has ongoing left shoulder pain, and has also been experiencing some right knee pain after a recent fall.  ED Course: Upon arrival to the ED, patient is found to be afebrile and saturating mid 90s on 3 L/min supplemental oxygen with SBP in the low 90s.  Labs are most notable for bicarbonate 40, normal creatinine, albumin 2.4, normal WBC, normal serum CK, and hemoglobin 9.5.  Review of Systems:  All other systems reviewed and apart from HPI, are negative.  Past Medical History:  Diagnosis Date   Anxiety    Arthritis    Cancer (HCC)    COPD (chronic obstructive pulmonary disease) (HCC)    Depression    GERD (gastroesophageal reflux disease)    Headache(784.0)    Hyperlipidemia    Hypertension    hx   Incontinent of urine    Oxygen dependent    2 L via [place] 24 hours per day   Pneumonia    Shortness of breath    on oxygen   Stroke [facility])     Past Surgical History:  Procedure Laterality Date   CAROTID STENT     Has known BICA occlusions since 2011; s/p left  vertebral artery stent 12/26/09 & right VA stent 02/12/10   CHOLECYSTECTOMY     PORT-A-CATH REMOVAL N/A 12/01/2022   Procedure: REMOVAL PORT-A-CATH;  Surgeon: Curvin Deward MOULD, MD;  Location: [facility] OR;  Service: General;  Laterality: N/A;   PORTACATH PLACEMENT Right 08/19/2021   Procedure: INSERTION PORT-A-CATH WITH ULTRASOUND;  Surgeon: Curvin Deward MOULD, MD;  Location: [facility] OR;  Service: General;  Laterality: Right;   ROTATOR CUFF REPAIR     SENTINEL NODE BIOPSY Bilateral 08/19/2021   Procedure: BILATERAL SENTINEL NODE BIOPSY;  Surgeon: Curvin Deward MOULD, MD;  Location: [facility] OR;  Service: General;  Laterality: Bilateral;   TOTAL MASTECTOMY Bilateral 08/19/2021   Procedure: BILATERAL TOTAL MASTECTOMY;  Surgeon: Curvin Deward MOULD, MD;  Location: [facility] OR;  Service: General;  Laterality: Bilateral;   TUBAL LIGATION      Social History:   reports that she has been smoking cigarettes. She has a 40 pack-year smoking history. She has been exposed to tobacco smoke. She has never used smokeless tobacco. She reports current drug use. Drug: Marijuana. She reports that she does not drink alcohol.  No Known Allergies  Family History  Problem Relation Age of Onset   Migraines Mother    Heart attack Mother    Bone cancer Maternal Aunt    Bone cancer Maternal Aunt    Breast cancer Cousin    Breast cancer Cousin  Prior to Admission medications   Medication Sig Start Date End Date Taking? Authorizing Provider  simvastatin  (ZOCOR ) 40 MG tablet Take 40 mg by mouth daily.   Yes [provider]  acetaminophen  (TYLENOL ) 500 MG tablet Take 1,000 mg by mouth every 8 (eight) hours as needed for moderate pain. Patient not taking: Reported on 10/19/2023    [provider]  albuterol  (VENTOLIN  HFA) 108 (90 Base) MCG/ACT inhaler Inhale 2 puffs into the lungs every 6 (six) hours as needed for wheezing or shortness of breath. Patient not taking: Reported on 10/19/2023    [provider]  clonazePAM   (KLONOPIN ) 0.5 MG tablet Take 1 tablet (0.5 mg total) by mouth in the morning, at noon, and at bedtime for 3 days. 10/26/23 10/29/23  [name], [name], MD  clopidogrel  (PLAVIX ) 75 MG tablet TAKE ONE TABLET DAILY FOR STROKE PREVENTION 10/27/23   [provider]  Fluticasone -Umeclidin-Vilant (TRELEGY ELLIPTA ) 100-62.5-25 MCG/ACT AEPB TAKE 1 PUFF BY MOUTH EVERY DAY Patient taking differently: Inhale 1 puff into the lungs daily. 06/12/23   Jude Harden GAILS, MD  furosemide (LASIX) 20 MG tablet TAKE ONE TABLET DAILY AS NEEDED FOR FLUID RETENTION 03/30/24   [provider]  gabapentin  (NEURONTIN ) 300 MG capsule Take 300 mg by mouth 3 (three) times daily.    [provider]  montelukast  (SINGULAIR ) 10 MG tablet Take 10 mg by mouth daily as needed (allergies). 04/26/22   [provider]  nicotine  (NICODERM CQ  - DOSED IN MG/24 HOURS) 14 mg/24hr patch Place 1 patch (14 mg total) onto the skin daily. 10/26/23   [name], Ava, DO  pantoprazole  (PROTONIX ) 40 MG tablet Take 40 mg by mouth 2 (two) times daily. 02/01/15   [provider]  revefenacin  (YUPELRI ) 175 MCG/3ML nebulizer solution Take 3 mLs (175 mcg total) by nebulization daily. 10/26/23   [name], Ava, DO  rosuvastatin  (CRESTOR ) 10 MG tablet Take 1 tablet (10 mg total) by mouth daily. 10/26/23   [name], Ava, DO  senna-docusate (SENOKOT-S) 8.6-50 MG tablet Take 1 tablet by mouth 2 (two) times daily. 10/25/23   [name], Ava, DO  traZODone  (DESYREL ) 150 MG tablet Take 300 mg by mouth at bedtime.    [provider]  venlafaxine  XR (EFFEXOR -XR) 150 MG 24 hr capsule Take 150 mg by mouth daily with breakfast.    [provider]    Physical Exam: Vitals:   06/08/24 2300 06/08/24 2315 06/09/24 0011 06/09/24 0015  BP:  (!) 108/46 114/66   Pulse: 62 60 67   Resp:  20 18   Temp:  97.6 F (36.4 C) 98.6 F (37 C)   TempSrc:   Oral   SpO2: 98% 99% 92%   Weight:    98.2 kg  Height:    5' 3 (1.6 m)     Constitutional: NAD, no diaphoresis   Eyes: PERTLA, lids and conjunctivae normal ENMT: Mucous membranes are moist. Posterior pharynx clear of any exudate or lesions.   Neck: supple, no masses  Respiratory: Speaking in full sentences. Diminished bilaterally with prolonged expiratory phase. No accessory muscle use.  Cardiovascular: S1 & S2 heard, regular rate and rhythm. No JVD. Abdomen: No tenderness, soft. Bowel sounds active.  Musculoskeletal: no clubbing / cyanosis. No joint deformity upper and lower extremities.   Skin: no significant rashes, lesions, ulcers. Warm, dry, well-perfused. Neurologic: CN 2-12 grossly intact. Weak on right. Alert and oriented.  Psychiatric: Calm. Cooperative.    Labs and Imaging on Admission: I have personally reviewed  following labs and imaging studies  CBC: Recent Labs  Lab 06/08/24 2205  WBC 5.4  HGB 9.5*  HCT 32.0*  MCV 93.6  PLT 188   Basic Metabolic Panel: Recent Labs  Lab 06/08/24 2205  NA 142  K 3.6  CL 93*  CO2 40*  GLUCOSE 119*  BUN 12  CREATININE 0.69  CALCIUM  8.1*   GFR: Estimated Creatinine Clearance: 78.2 mL/min (by C-G formula based on SCr of 0.69 mg/dL). Liver Function Tests: Recent Labs  Lab 06/08/24 2205  AST 11*  ALT 9  ALKPHOS 33*  BILITOT 0.6  PROT 5.7*  ALBUMIN 2.4*   No results for input(s): LIPASE, AMYLASE in the last 168 hours. No results for input(s): AMMONIA in the last 168 hours. Coagulation Profile: No results for input(s): INR, PROTIME in the last 168 hours. Cardiac Enzymes: Recent Labs  Lab 06/08/24 2205  CKTOTAL 44   BNP (last 3 results) No results for input(s): PROBNP in the last 8760 hours. HbA1C: No results for input(s): HGBA1C in the last 72 hours. CBG: No results for input(s): GLUCAP in the last 168 hours. Lipid Profile: No results for input(s): CHOL, HDL, LDLCALC, TRIG, CHOLHDL, LDLDIRECT in the last 72 hours. Thyroid  Function Tests: No results  for input(s): TSH, T4TOTAL, FREET4, T3FREE, THYROIDAB in the last 72 hours. Anemia Panel: No results for input(s): VITAMINB12, FOLATE, FERRITIN, TIBC, IRON, RETICCTPCT in the last 72 hours. Urine analysis:    Component Value Date/Time   COLORURINE YELLOW 06/01/2024 1804   APPEARANCEUR CLEAR 06/01/2024 1804   LABSPEC 1.019 06/01/2024 1804   PHURINE 5.0 06/01/2024 1804   GLUCOSEU NEGATIVE 06/01/2024 1804   HGBUR NEGATIVE 06/01/2024 1804   BILIRUBINUR NEGATIVE 06/01/2024 1804   KETONESUR NEGATIVE 06/01/2024 1804   PROTEINUR NEGATIVE 06/01/2024 1804   UROBILINOGEN 0.2 12/05/2009 1953   NITRITE NEGATIVE 06/01/2024 1804   LEUKOCYTESUR NEGATIVE 06/01/2024 1804   Sepsis Labs: @LABRCNTIP (procalcitonin:4,lacticidven:4) )No results found for this or any previous visit (from the past 240 hours).   Radiological Exams on Admission: DG Knee Complete 4 Views Right Result Date: 06/08/2024 CLINICAL DATA:  trauma, fall EXAM: RIGHT KNEE - COMPLETE 4+ VIEW COMPARISON:  None Available. FINDINGS: Osteopenia.No acute fracture or dislocation. Trace joint effusion. Moderate patellofemoral joint space loss with medial compartment joint space loss. Tricompartmental osteophyte formation. Soft tissues are unremarkable. IMPRESSION: 1. Trace joint effusion.  No acute fracture or dislocation. 2. 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 Electronically Signed   By: Rogelia Myers M.D.   On: 06/08/2024 22:16    EKG: Independently reviewed. Sinus rhythm.   Assessment/Plan   1. General weakness  - Patient has been too generally weak to get up from her couch for the past two days  - There is no new focal deficits, no apparent infectious process, and this is likely deconditioning; anemia may be contributing as well  - Check TSH, ammonia, and B12, trend H&H, and consult PT    2. COPD; chronic hypoxic & hypercarbic respiratory failure  - Not in exacerbation  - Continue  ICS-LAMA-LABA, supplemental O2, and as-needed short-acting bronchodilators    3. Left proximal humerus fracture  - Fell on 8/6 and was found to have proximal left humerus fracture  - Continue shoulder sling, pain-control, and outpatient ortho follow-up     4. Anemia  - Hgb is 9.5, down from 11.7 on 06/01/24  - No overt bleeding  - Check anemia panel and trend H&H    5. Chronic HFpEF  - Appears  compensated  6. Depression, anxiety  - Effexor , Klonopin , trazodone    7. Hx of CVA  - Has residual right-sided deficits  - Plavix  and Zocor     DVT prophylaxis: Lovenox   Code Status: DNR  Level of Care: Level of care: Med-Surg Family Communication: none present  Disposition Plan:  Patient is from: home  Anticipated d/c is to: TBD Anticipated d/c date is: 8/14 or 06/10/24  Patient currently: Pending additional labs, PT eval  Consults called: None  Admission status: Observation     Evalene GORMAN Sprinkles, MD [facility]  06/09/2024, 2:15 AM
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
## 2024-06-09 NOTE — Progress Notes (Signed)
 PROGRESS NOTE   April [name]  FMW:[id number] DOB: 01-09-1956 DOA: 06/08/2024 PCP: Tanda Prentice DEL, MD   Chief Complaint  Patient presents with   Leg Pain   Level of care: Med-Surg  Brief Admission History:  68 y.o. female with medical history significant for COPD, chronic hypoxic and hypercarbic respiratory failure, history of CVA, depression, anxiety, chronic HFpEF, BMI 38, and recent proximal left humerus fracture who presents with generalized weakness.   Patient has chronic right-sided motor deficits from a stroke, suffered a proximal left humerus fracture on 06/01/2024, and has had difficulty performing her ADLs since then.  She reports progressive generalized weakness and has been too weak to get off of her couch where she has remained for the past 2 days.  She denies any new focal numbness or weakness, denies chest pain or palpitations, denies fever or chills, and denies any change in her chronic respiratory symptoms.  She has ongoing left shoulder pain, and has also been experiencing some right knee pain after a recent fall.   ED Course: Upon arrival to the ED, patient is found to be afebrile and saturating mid 90s on 3 L/min supplemental oxygen with SBP in the low 90s.  Labs are most notable for bicarbonate 40, normal creatinine, albumin 2.4, normal WBC, normal serum CK, and hemoglobin 9.5.   Assessment and Plan:   General weakness  - Patient has been too generally weak to get up from her couch for the past two days  - There is no new focal deficits, no apparent infectious process, and this is likely deconditioning; anemia may be contributing as well  - Check TSH, ammonia, and B12, trend H&H, and consult PT    Hypothyroidism  -- markedly elevated TSH -- check FT4 and T3 -- pt reports she has not been taking levothyroxine  regularly -- unfortunately still waiting on home meds to be reconciled by pharmacy so that we can get her restarted on her home medications!    COPD; chronic  hypoxic & hypercarbic respiratory failure  - Not in exacerbation  - Continue ICS-LAMA-LABA, supplemental O2, and as-needed short-acting bronchodilators     Left proximal humerus fracture  [facility] on 8/6 and was found to have proximal left humerus fracture  - Continue shoulder sling, pain-control, and outpatient ortho follow-up      Anemia  - Hgb is 9.5, down from 11.7 on 06/01/24  - No overt bleeding  - Check anemia panel and trend H&H     Chronic HFpEF  - Appears compensated   Depression, anxiety  - Effexor , Klonopin , trazodone     Hx of CVA  - Has residual right-sided deficits  - Plavix  and Zocor    Bedbugs -- contact precautions for now  DVT prophylaxis: enoxaparin   Code Status: DNR  Family Communication: not present  Disposition: anticipating SNF    Consultants:   Procedures:   Antimicrobials:    Subjective: Pt reports she feels weak, says she has not been taking levothyroxine  regularly for unknown reasons, last taken a dose last week per patient.   Objective: Vitals:   06/09/24 0015 06/09/24 0305 06/09/24 0352 06/09/24 1426  BP:  (!) 119/48 (!) 118/59 (!) 107/53  Pulse:  67 60 67  Resp:  18 18 20   Temp:  98.4 F (36.9 C) 97.7 F (36.5 C) 98.3 F (36.8 C)  TempSrc:  Oral Oral Oral  SpO2:  97% 95% 96%  Weight: 98.2 kg     Height: 5' 3 (1.6 m)  Intake/Output Summary (Last 24 hours) at 06/09/2024 1518 Last data filed at 06/09/2024 1300 Gross per 24 hour  Intake 960 ml  Output 700 ml  Net 260 ml   Filed Weights   06/08/24 2110 06/09/24 0015  Weight: 103.2 kg 98.2 kg   Examination:  General exam: Appears calm and comfortable  Respiratory system: Clear to auscultation. Respiratory effort normal. Cardiovascular system: normal S1 & S2 heard. No JVD, murmurs, rubs, gallops or clicks. No pedal edema. Gastrointestinal system: Abdomen is nondistended, soft and nontender. No organomegaly or masses felt. Normal bowel sounds heard. Central nervous system:  Alert and oriented. No focal neurological deficits. Extremities: Symmetric 5 x 5 power. Skin: No rashes, lesions or ulcers. Psychiatry: Judgement and insight appear normal. Mood & affect appropriate.   Data Reviewed: I have personally reviewed following labs and imaging studies  CBC: Recent Labs  Lab 06/08/24 2205 06/09/24 0618  WBC 5.4 5.7  HGB 9.5* 9.8*  HCT 32.0* 32.1*  MCV 93.6 93.0  PLT 188 176    Basic Metabolic Panel: Recent Labs  Lab 06/08/24 2205 06/09/24 0618  NA 142 139  K 3.6 3.7  CL 93* 93*  CO2 40* 36*  GLUCOSE 119* 101*  BUN 12 10  CREATININE 0.69 0.67  CALCIUM  8.1* 8.1*  MG 2.3  --   PHOS 3.4  --     CBG: No results for input(s): GLUCAP in the last 168 hours.  No results found for this or any previous visit (from the past 240 hours).   Radiology Studies: DG Knee Complete 4 Views Right Result Date: 06/08/2024 CLINICAL DATA:  trauma, fall EXAM: RIGHT KNEE - COMPLETE 4+ VIEW COMPARISON:  None Available. FINDINGS: Osteopenia.No acute fracture or dislocation. Trace joint effusion. Moderate patellofemoral joint space loss with medial compartment joint space loss. Tricompartmental osteophyte formation. Soft tissues are unremarkable. IMPRESSION: 1. Trace joint effusion.  No acute fracture or dislocation. 2. 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 Electronically Signed   By: Rogelia Myers M.D.   On: 06/08/2024 22:16    Scheduled Meds:  budesonide -glycopyrrolate -formoterol   2 puff Inhalation BID   clopidogrel   75 mg Oral Daily   enoxaparin  (LOVENOX ) injection  50 mg Subcutaneous Q24H   pantoprazole   40 mg Oral BID   simvastatin   40 mg Oral Daily   traZODone   300 mg Oral QHS   venlafaxine  XR  150 mg Oral Q breakfast   Continuous Infusions:   LOS: 0 days   Time spent: 57 mins Yovana Scogin Vicci, MD How to contact the [facility] Attending or Consulting provider 7A - 7P or covering provider during after hours 7P -7A, for this patient?   Check the care team in [facility] and look for a) attending/consulting [facility] provider listed and b) the TR[facility] team listed Log into [email or web address] to find provider on call.  Locate the TR[facility] provider you are looking for under [facility] and page to a number that you can be directly reached. If you still have difficulty reaching the provider, please page the [facility] (Director on Call) for the Hospitalists listed on amion for assistance.  06/09/2024, 3:18 PM

## 2024-06-09 NOTE — Evaluation (Signed)
 Physical Therapy Evaluation Patient Details Name: April [name] MRN: [id number] DOB: 28-Oct-1955 Today's Date: 06/09/2024  History of Present Illness  Kathee Tumlin is a 68 y.o. female with medical history significant for COPD, chronic hypoxic and hypercarbic respiratory failure, history of CVA, depression, anxiety, chronic HFpEF, BMI 38, and recent proximal left humerus fracture who presents with generalized weakness.     Patient has chronic right-sided motor deficits from a stroke, suffered a proximal left humerus fracture on 06/01/2024, and has had difficulty performing her ADLs since then.  She reports progressive generalized weakness and has been too weak to get off of her couch where she has remained for the past 2 days.  She denies any new focal numbness or weakness, denies chest pain or palpitations, denies fever or chills, and denies any change in her chronic respiratory symptoms.  She has ongoing left shoulder pain, and has also been experiencing some right knee pain after a recent fall.   Clinical Impression  Patient demonstrates slow labored movement for sitting up at bedside requiring modA for upright positioning. Patient limited to ambulation at bedside due to c/o of pain. Patient unsteady without AD, slightly improved with RW with no episodes of knee buckling despite subjective report of Rt leg numbness. Patient presents with poor activity tolerance and BIL LE weakness significantly impacting her current independence with functional mobility tasks. Patient will benefit from continued skilled physical therapy in hospital and recommended venue below to increase strength, balance, endurance for safe ADLs and gait.         If plan is discharge home, recommend the following: A little help with walking and/or transfers;Help with stairs or ramp for entrance;Assist for transportation;A little help with bathing/dressing/bathroom   Can travel by private vehicle   Yes    Equipment  Recommendations None recommended by PT  Recommendations for Other Services       Functional Status Assessment Patient has had a recent decline in their functional status and demonstrates the ability to make significant improvements in function in a reasonable and predictable amount of time.     Precautions / Restrictions Precautions Precautions: Fall Recall of Precautions/Restrictions: Impaired Restrictions Weight Bearing Restrictions Per Provider Order: Yes Other Position/Activity Restrictions: L UE in sling; likely non-weight bearing.      Mobility  Bed Mobility Overal bed mobility: Needs Assistance Bed Mobility: Supine to Sit     Supine to sit: HOB elevated, Mod assist     General bed mobility comments: Assiste needed to bring trunk to upright position.    Transfers Overall transfer level: Needs assistance Equipment used: None Transfers: Sit to/from Stand, Bed to chair/wheelchair/BSC Sit to Stand: Min assist, Mod assist   Step pivot transfers: Mod assist       General transfer comment: labored movement; unsteady; extended time    Ambulation/Gait Ambulation/Gait assistance: Mod assist Gait Distance (Feet): 6 Feet Assistive device: Rolling walker (2 wheels) Gait Pattern/deviations: Decreased step length - right, Decreased step length - left, Decreased stride length Gait velocity: decreased     General Gait Details: labored movement, RW to increase stability; no difficulties movement of Rt LE despite subjective c/o of numbness  Stairs            Wheelchair Mobility     Tilt Bed    Modified Rankin (Stroke Patients Only)       Balance Overall balance assessment: Needs assistance Sitting-balance support: No upper extremity supported, Feet supported Sitting balance-Leahy Scale: Fair Sitting balance - Comments: seated at  EOB   Standing balance support: Single extremity supported, During functional activity Standing balance-Leahy Scale:  Poor Standing balance comment: poor without AD; poor to fair with RW                             Pertinent Vitals/Pain Pain Assessment Pain Score: 10-Worst pain ever Pain Location: L UE Pain Descriptors / Indicators: Throbbing Pain Intervention(s): Limited activity within patient's tolerance, Monitored during session, Repositioned    Home Living Family/patient expects to be discharged to:: Private residence Living Arrangements: Alone Available Help at Discharge: Family;Neighbor Type of Home: Apartment Home Access: Level entry;Elevator       Home Layout: One level Home Equipment: Wheelchair - manual Additional Comments: Reports no change in home living    Prior Function Prior Level of Function : Independent/Modified Independent             Mobility Comments: ambulates without AD ADLs Comments: Grocery assist; independent ADL's.     Extremity/Trunk Assessment   Upper Extremity Assessment Upper Extremity Assessment: LUE deficits/detail;RUE deficits/detail RUE Deficits / Details: 3-/5 shoulder flexion; generally weak LUE Deficits / Details: Sling adjusted to better fit; arm immobilized in sling due to recent fracture.    Lower Extremity Assessment Lower Extremity Assessment: Generalized weakness;RLE deficits/detail RLE Deficits / Details: strength intact RLE Sensation: decreased light touch    Cervical / Trunk Assessment Cervical / Trunk Assessment: Kyphotic  Communication   Communication Communication: Impaired Factors Affecting Communication: Hearing impaired    Cognition Arousal: Alert Behavior During Therapy: WFL for tasks assessed/performed   PT - Cognitive impairments: No family/caregiver present to determine baseline, Orientation   Orientation impairments: Time, Situation (Unable to recall month or reason for being in the hospital)                   PT - Cognition Comments: Some difficulty with recall during subjective Following  commands: Intact       Cueing Cueing Techniques: Verbal cues, Tactile cues     General Comments      Exercises     Assessment/Plan    PT Assessment Patient needs continued PT services  PT Problem List Decreased strength;Decreased balance;Decreased mobility;Decreased activity tolerance       PT Treatment Interventions DME instruction;Therapeutic activities;Therapeutic exercise;Gait training;Patient/family education;Balance training;Functional mobility training    PT Goals (Current goals can be found in the Care Plan section)  Acute Rehab PT Goals Patient Stated Goal: return home PT Goal Formulation: With patient Time For Goal Achievement: 06/23/24 Potential to Achieve Goals: Good    Frequency Min 3X/week     Co-evaluation PT/OT/SLP Co-Evaluation/Treatment: Yes Reason for Co-Treatment: To address functional/ADL transfers PT goals addressed during session: Mobility/safety with mobility;Balance;Proper use of DME OT goals addressed during session: ADL's and self-care       AM-PAC PT 6 Clicks Mobility  Outcome Measure Help needed turning from your back to your side while in a flat bed without using bedrails?: A Little Help needed moving from lying on your back to sitting on the side of a flat bed without using bedrails?: A Little Help needed moving to and from a bed to a chair (including a wheelchair)?: A Lot Help needed standing up from a chair using your arms (e.g., wheelchair or bedside chair)?: A Little Help needed to walk in hospital room?: A Lot Help needed climbing 3-5 steps with a railing? : Total 6 Click Score: 14    End  of Session Equipment Utilized During Treatment: Oxygen Activity Tolerance: Patient limited by pain;Patient limited by fatigue Patient left: in chair;with call bell/phone within reach Nurse Communication: Mobility status PT Visit Diagnosis: Unsteadiness on feet (R26.81);Muscle weakness (generalized) (M62.81);History of falling (Z91.81)     Time: 9082-9057 PT Time Calculation (min) (ACUTE ONLY): 25 min   Charges:   PT Evaluation $PT Eval Moderate Complexity: 1 Mod PT Treatments $Therapeutic Activity: 23-37 mins PT General Charges $$ ACUTE PT VISIT: 1 Visit         2:13 PM, 06/09/24,  Kadeja Granada, SPT
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
## 2024-06-09 NOTE — Evaluation (Signed)
 Occupational Therapy Evaluation Patient Details Name: April [name] MRN: [id number] DOB: 1956/04/13 Today's Date: 06/09/2024   History of Present Illness   April [name] is a 68 y.o. female with medical history significant for COPD, chronic hypoxic and hypercarbic respiratory failure, history of CVA, depression, anxiety, chronic HFpEF, BMI 38, and recent proximal left humerus fracture who presents with generalized weakness.     Patient has chronic right-sided motor deficits from a stroke, suffered a proximal left humerus fracture on 06/01/2024, and has had difficulty performing her ADLs since then.  She reports progressive generalized weakness and has been too weak to get off of her couch where she has remained for the past 2 days.  She denies any new focal numbness or weakness, denies chest pain or palpitations, denies fever or chills, and denies any change in her chronic respiratory symptoms.  She has ongoing left shoulder pain, and has also been experiencing some right knee pain after a recent fall. (per MD)     Clinical Impressions Pt agreeable to OT and PT co-evaluation. Pt presents with mixed orientation. Pt wearing a sling on L UE due to recent fracture. Sling adjusted for slightly better fit during the session. Mod A needed for bed mobility and transfer to the chair without AD. Able to ambulate using single UE support on the RW for just a few steps with mod A. R UE weak with limited shoulder flexion. Max to total assist needed for lower body ADL's based on observation today. Min to mod A for much of the upper body ADL's. Pt left in the chair, chair alarm on, and with the call bell within reach. Pt will benefit from continued OT in the hospital and recommended venue below to increase strength, balance, and endurance for safe ADL's.        If plan is discharge home, recommend the following:   A little help with walking and/or transfers;A lot of help with bathing/dressing/bathroom;Assistance  with cooking/housework;Assist for transportation;Help with stairs or ramp for entrance     Functional Status Assessment   Patient has had a recent decline in their functional status and demonstrates the ability to make significant improvements in function in a reasonable and predictable amount of time.     Equipment Recommendations   None recommended by OT             Precautions/Restrictions   Precautions Precautions: Fall Recall of Precautions/Restrictions: Impaired Restrictions Weight Bearing Restrictions Per Provider Order: Yes Other Position/Activity Restrictions: L UE in sling; likely non-weight bearing.     Mobility Bed Mobility Overal bed mobility: Needs Assistance Bed Mobility: Supine to Sit     Supine to sit: HOB elevated, Mod assist     General bed mobility comments: Assiste needed to bring trunk to upright position.    Transfers Overall transfer level: Needs assistance   Transfers: Sit to/from Stand, Bed to chair/wheelchair/BSC Sit to Stand: Min assist, Mod assist     Step pivot transfers: Mod assist     General transfer comment: labored movement; unsteady; extended time      Balance Overall balance assessment: Needs assistance Sitting-balance support: No upper extremity supported, Feet supported Sitting balance-Leahy Scale: Fair Sitting balance - Comments: seated at EOB   Standing balance support: Single extremity supported, During functional activity Standing balance-Leahy Scale: Poor Standing balance comment: poor without AD; poor to fair with RW  ADL either performed or assessed with clinical judgement   ADL Overall ADL's : Needs assistance/impaired Eating/Feeding: Set up;Sitting   Grooming: Moderate assistance;Minimal assistance;Sitting   Upper Body Bathing: Moderate assistance;Sitting   Lower Body Bathing: Maximal assistance;Total assistance;Sitting/lateral leans   Upper Body Dressing :  Moderate assistance;Sitting   Lower Body Dressing: Maximal assistance;Total assistance;Sitting/lateral leans   Toilet Transfer: Moderate assistance;Rolling walker (2 wheels);Stand-pivot;Ambulation Toilet Transfer Details (indicate cue type and reason): EOB to chair and short distance ambulation in the room. RW used for ambulation. Toileting- Clothing Manipulation and Hygiene: Moderate assistance;Maximal assistance;Sitting/lateral lean       Functional mobility during ADLs: Moderate assistance;Rolling walker (2 wheels) General ADL Comments: Able to take ~3 steps forward then backward with RW and assist to manage L side of RW since L UE is in the sling.     Vision Baseline Vision/History: 1 Wears glasses Ability to See in Adequate Light: 1 Impaired Patient Visual Report: No change from baseline Vision Assessment?: No apparent visual deficits     Perception Perception: Not tested       Praxis Praxis: Not tested       Pertinent Vitals/Pain Pain Assessment Pain Assessment: 0-10 Pain Score: 10-Worst pain ever Pain Location: L UE Pain Descriptors / Indicators: Throbbing Pain Intervention(s): Monitored during session, Limited activity within patient's tolerance, Repositioned     Extremity/Trunk Assessment Upper Extremity Assessment Upper Extremity Assessment: LUE deficits/detail;RUE deficits/detail RUE Deficits / Details: 3-/5 shoulder flexion; generally weak LUE Deficits / Details: Sling adjusted to better fit; arm immobilized in sling due to recent fracture.   Lower Extremity Assessment Lower Extremity Assessment: Defer to PT evaluation   Cervical / Trunk Assessment Cervical / Trunk Assessment: Kyphotic   Communication Communication Communication: Impaired Factors Affecting Communication: Hearing impaired   Cognition Arousal: Alert Behavior During Therapy: WFL for tasks assessed/performed Cognition: No family/caregiver present to determine baseline              OT - Cognition Comments: Able to follow commands with verbal and tactile cuing. Pt not orietned to month, situation. Oriented to year and place.                 Following commands: Intact       Cueing  General Comments   Cueing Techniques: Verbal cues;Tactile cues                 Home Living Family/patient expects to be discharged to:: Private residence Living Arrangements: Alone Available Help at Discharge: Family;Neighbor Type of Home: Apartment Home Access: Level entry;Elevator     Home Layout: One level     Bathroom Shower/Tub: Chief Strategy Officer: Standard Bathroom Accessibility: Yes How Accessible: Accessible via walker Home Equipment: Wheelchair - manual          Prior Functioning/Environment Prior Level of Function : Independent/Modified Independent             Mobility Comments: ambulates without AD ADLs Comments: Grocery assist; independent ADL's.    OT Problem List: Decreased strength;Decreased range of motion;Decreased activity tolerance;Impaired balance (sitting and/or standing);Decreased knowledge of use of DME or AE;Impaired UE functional use;Pain   OT Treatment/Interventions: Self-care/ADL training;Therapeutic exercise;DME and/or AE instruction;Therapeutic activities;Patient/family education;Balance training      OT Goals(Current goals can be found in the care plan section)   Acute Rehab OT Goals Patient Stated Goal: improve function OT Goal Formulation: With patient Time For Goal Achievement: 06/23/24 Potential to Achieve Goals: Good   OT Frequency:  Min 3X/week    Co-evaluation PT/OT/SLP Co-Evaluation/Treatment: Yes Reason for Co-Treatment: To address functional/ADL transfers   OT goals addressed during session: ADL's and self-care      AM-PAC OT 6 Clicks Daily Activity     Outcome Measure Help from another person eating meals?: A Little Help from another person taking care of personal grooming?: A  Little Help from another person toileting, which includes using toliet, bedpan, or urinal?: A Lot Help from another person bathing (including washing, rinsing, drying)?: A Lot Help from another person to put on and taking off regular upper body clothing?: A Little Help from another person to put on and taking off regular lower body clothing?: A Lot 6 Click Score: 15   End of Session Equipment Utilized During Treatment: Rolling walker (2 wheels);Oxygen  Activity Tolerance: Patient tolerated treatment well Patient left: in chair;with call bell/phone within reach; chair alarm on  OT Visit Diagnosis: Unsteadiness on feet (R26.81);Other abnormalities of gait and mobility (R26.89);Muscle weakness (generalized) (M62.81);History of falling (Z91.81)                Time: 9081-9054 OT Time Calculation (min): 27 min Charges:  OT General Charges $OT Visit: 1 Visit OT Evaluation $OT Eval Low Complexity: 1 Low  Kosisochukwu Burningham OT, MOT   Jayson Person 06/09/2024, 12:28 PM

## 2024-06-10 DIAGNOSIS — J9612 Chronic respiratory failure with hypercapnia: Secondary | ICD-10-CM

## 2024-06-10 DIAGNOSIS — J410 Simple chronic bronchitis: Secondary | ICD-10-CM | POA: Diagnosis not present

## 2024-06-10 DIAGNOSIS — R54 Age-related physical debility: Secondary | ICD-10-CM | POA: Diagnosis not present

## 2024-06-10 DIAGNOSIS — R5381 Other malaise: Secondary | ICD-10-CM | POA: Diagnosis not present

## 2024-06-10 DIAGNOSIS — D649 Anemia, unspecified: Secondary | ICD-10-CM | POA: Diagnosis not present

## 2024-06-10 DIAGNOSIS — I5032 Chronic diastolic (congestive) heart failure: Secondary | ICD-10-CM

## 2024-06-10 DIAGNOSIS — J9611 Chronic respiratory failure with hypoxia: Secondary | ICD-10-CM

## 2024-06-10 DIAGNOSIS — F419 Anxiety disorder, unspecified: Secondary | ICD-10-CM | POA: Diagnosis not present

## 2024-06-10 LAB — BASIC METABOLIC PANEL WITH GFR
Anion gap: 9 (ref 5–15)
BUN: 11 mg/dL (ref 8–23)
CO2: 34 mmol/L — ABNORMAL HIGH (ref 22–32)
Calcium: 8.1 mg/dL — ABNORMAL LOW (ref 8.9–10.3)
Chloride: 95 mmol/L — ABNORMAL LOW (ref 98–111)
Creatinine, Ser: 0.78 mg/dL (ref 0.44–1.00)
GFR, Estimated: >60 mL/min (ref >60)
Glucose, Bld: 85 mg/dL (ref 70–99)
Potassium: 3.6 mmol/L (ref 3.5–5.1)
Sodium: 138 mmol/L (ref 135–145)

## 2024-06-10 LAB — CBC
HCT: 33.1 % — ABNORMAL LOW (ref 36.0–46.0)
Hemoglobin: 9.9 g/dL — ABNORMAL LOW (ref 12.0–15.0)
MCH: 27.8 pg (ref 26.0–34.0)
MCHC: 29.9 g/dL — ABNORMAL LOW (ref 30.0–36.0)
MCV: 93 fL (ref 80.0–100.0)
Platelets: 181 K/uL (ref 150–400)
RBC: 3.56 MIL/uL — ABNORMAL LOW (ref 3.87–5.11)
RDW: 13.5 % (ref 11.5–15.5)
WBC: 6 K/uL (ref 4.0–10.5)
nRBC: 0 % (ref 0.0–0.2)

## 2024-06-10 LAB — T3: T3, Total: 92 ng/dL (ref 71–180)

## 2024-06-10 MED ORDER — GABAPENTIN 300 MG PO CAPS: 300.0000 mg | ORAL_CAPSULE | Freq: Every evening | ORAL | Status: AC | PRN

## 2024-06-10 MED ORDER — POLYETHYLENE GLYCOL 3350 17 G PO PACK: 17.0000 g | PACK | Freq: Every day | ORAL | Status: AC | PRN

## 2024-06-10 MED ORDER — LEVOTHYROXINE SODIUM 25 MCG PO TABS
25.0000 ug | ORAL_TABLET | Freq: Every day | ORAL | Status: DC
  Administered 2024-06-10: 25 ug via ORAL
  Filled 2024-06-10: qty 1

## 2024-06-10 MED ORDER — TRELEGY ELLIPTA 100-62.5-25 MCG/ACT IN AEPB
1.0000 | INHALATION_SPRAY | Freq: Every day | RESPIRATORY_TRACT | Status: AC
Start: 2024-06-10 — End: ?

## 2024-06-10 MED ORDER — IPRATROPIUM-ALBUTEROL 0.5-2.5 (3) MG/3ML IN SOLN
3.0000 mL | Freq: Four times a day (QID) | RESPIRATORY_TRACT | Status: AC | PRN
Start: 2024-06-10 — End: ?

## 2024-06-10 MED ORDER — OXYCODONE-ACETAMINOPHEN 5-325 MG PO TABS: 1.0000 | ORAL_TABLET | Freq: Four times a day (QID) | ORAL | 0 refills | Status: DC | PRN

## 2024-06-10 MED ORDER — CLONAZEPAM 0.5 MG PO TABS: 0.5000 mg | ORAL_TABLET | Freq: Three times a day (TID) | ORAL | 0 refills | Status: AC | PRN

## 2024-06-10 MED ORDER — GABAPENTIN 300 MG PO CAPS
300.0000 mg | ORAL_CAPSULE | Freq: Every day | ORAL | Status: DC
Start: 2024-06-10 — End: 2024-06-10

## 2024-06-10 NOTE — Plan of Care (Signed)

## 2024-06-10 NOTE — Progress Notes (Signed)
 Report called to Angie RN at [facility], [phone] room number [phone].

## 2024-06-10 NOTE — TOC Transition Note (Signed)
 Transition of Care [facility]) - Discharge Note   Patient Details  Name: April [name] MRN: [id number] Date of Birth: December 18, 1955  [facility] CM/SW Contact:  Lucie Lunger, LCSWA Phone Number: 06/10/2024, 12:28 PM  Clinical Narrative:    CSW updated that insurance shara has been approved for both SNF and EMS. CSW spoke to Ozell with [facility] who states that they can accept pt today. D/C clinicals sent to facility via HUB. CSW spoke with pts sister to provide update on D/C. Room and report numbers provided to RN. Med necessity printed to floor for RN. EMS called for transport. TOC signing off.   Final next level of care: Skilled Nursing Facility Barriers to Discharge: Barriers Resolved   Patient Goals and CMS Choice Patient states their goals for this hospitalization and ongoing recovery are:: go to SNF CMS Medicare.gov Compare Post Acute Care list provided to:: Patient Choice offered to / list presented to : Patient, Sibling      Discharge Placement              Patient chooses bed at: [facility] Patient to be transferred to facility by: EMS Name of family member notified: Sister Patient and family notified of of transfer: 06/10/24  Discharge Plan and Services Additional resources added to the After Visit Summary for   In-house Referral: Clinical Social Work Discharge Planning Services: CM Consult Post Acute Care Choice: Skilled Nursing Facility                               Social Drivers of Health (SDOH) Interventions SDOH Screenings   Food Insecurity: No Food Insecurity (06/09/2024)  Housing: Low Risk  (06/09/2024)  Transportation Needs: No Transportation Needs (06/09/2024)  Utilities: Not At Risk (06/09/2024)  Social Connections: Socially Isolated (06/09/2024)  Tobacco Use: High Risk (06/09/2024)     Readmission Risk Interventions     No data to display

## 2024-06-10 NOTE — Discharge Instructions (Signed)
 IMPORTANT INFORMATION: PAY CLOSE ATTENTION   PHYSICIAN DISCHARGE INSTRUCTIONS  Follow with Primary care provider  Tanda Prentice DEL, MD  and other consultants as instructed by your Hospitalist Physician  SEEK MEDICAL CARE OR RETURN TO EMERGENCY ROOM IF SYMPTOMS COME BACK, WORSEN OR NEW PROBLEM DEVELOPS   Please note: You were cared for by a hospitalist during your hospital stay. Every effort will be made to forward records to your primary care provider.  You can request that your primary care provider send for your hospital records if they have not received them.  Once you are discharged, your primary care physician will handle any further medical issues. Please note that NO REFILLS for any discharge medications will be authorized once you are discharged, as it is imperative that you return to your primary care physician (or establish a relationship with a primary care physician if you do not have one) for your post hospital discharge needs so that they can reassess your need for medications and monitor your lab values.  Please get a complete blood count and chemistry panel checked by your Primary MD at your next visit, and again as instructed by your Primary MD.  Get Medicines reviewed and adjusted: Please take all your medications with you for your next visit with your Primary MD  Laboratory/radiological data: Please request your Primary MD to go over all hospital tests and procedure/radiological results at the follow up, please ask your primary care provider to get all Hospital records sent to his/her office.  In some cases, they will be blood work, cultures and biopsy results pending at the time of your discharge. Please request that your primary care provider follow up on these results.  If you are diabetic, please bring your blood sugar readings with you to your follow up appointment with primary care.    Please call and make your follow up appointments as soon as possible.    Also Note  the following: If you experience worsening of your admission symptoms, develop shortness of breath, life threatening emergency, suicidal or homicidal thoughts you must seek medical attention immediately by calling 911 or calling your MD immediately  if symptoms less severe.  You must read complete instructions/literature along with all the possible adverse reactions/side effects for all the Medicines you take and that have been prescribed to you. Take any new Medicines after you have completely understood and accpet all the possible adverse reactions/side effects.   Do not drive when taking Pain medications or sleeping medications (Benzodiazepines)  Do not take more than prescribed Pain, Sleep and Anxiety Medications. It is not advisable to combine anxiety,sleep and pain medications without talking with your primary care practitioner  Special Instructions: If you have smoked or chewed Tobacco  in the last 2 yrs please stop smoking, stop any regular Alcohol  and or any Recreational drug use.  Wear Seat belts while driving.  Do not drive if taking any narcotic, mind altering or controlled substances or recreational drugs or alcohol.

## 2024-06-10 NOTE — Care Management Obs Status (Signed)
 MEDICARE OBSERVATION STATUS NOTIFICATION   Patient Details  Name: April [name] MRN: [id number] Date of Birth: 26-Oct-1956   Medicare Observation Status Notification Given:  Yes    Duwaine LITTIE Ada 06/10/2024, 10:29 AM

## 2024-06-10 NOTE — Discharge Summary (Signed)
 Physician Discharge Summary  April [name] FMW:[id number] DOB: 1956-04-30 DOA: 06/08/2024  PCP: Tanda Prentice DEL, MD  Admit date: 06/08/2024 Discharge date: 06/10/2024  Admitted From:  Home  Disposition: SNF   Recommendations for Outpatient Follow-up:  Follow up with PCP in 1-2 weeks Follow up with orthopedics as scheduled in 2 weeks Please obtain BMP/CBC in 2 weeks Please check TSH and free T4 in 1 month  Home Health: SNF  Discharge Condition: STABLE   CODE STATUS: DNR DIET: regular    Brief Hospitalization Summary: Please see all hospital notes, images, labs for full details of the hospitalization. Admission provider HPI:  68 y.o. female with medical history significant for COPD, chronic hypoxic and hypercarbic respiratory failure, history of CVA, depression, anxiety, chronic HFpEF, BMI 38, and recent proximal left humerus fracture who presents with generalized weakness.   Patient has chronic right-sided motor deficits from a stroke, suffered a proximal left humerus fracture on 06/01/2024, and has had difficulty performing her ADLs since then.  She reports progressive generalized weakness and has been too weak to get off of her couch where she has remained for the past 2 days.  She denies any new focal numbness or weakness, denies chest pain or palpitations, denies fever or chills, and denies any change in her chronic respiratory symptoms.  She has ongoing left shoulder pain, and has also been experiencing some right knee pain after a recent fall.   ED Course: Upon arrival to the ED, patient is found to be afebrile and saturating mid 90s on 3 L/min supplemental oxygen with SBP in the low 90s.  Labs are most notable for bicarbonate 40, normal creatinine, albumin 2.4, normal WBC, normal serum CK, and hemoglobin 9.5.  HOSPITAL COURSE BY LISTED PROBLEMS ADDRESSED  General weakness  - Patient has been too generally weak to get up from her couch for the past two days  - There is no new focal  deficits, no apparent infectious process, and this is likely deconditioning; anemia may be contributing as well  - medical work up reveals poorly controlled hypothyroidism due to nonadherence      Hypothyroidism  -- markedly elevated TSH at 19.875 -- checked FT4 - 0.71 -- pt reports she has not been taking levothyroxine  regularly -- resume daily levothyroxine  therapy before breakfast for proper absorption   COPD; chronic hypoxic & hypercarbic respiratory failure  - Not in exacerbation  - Continue ICS-LAMA-LABA, supplemental O2 as-needed    Left proximal humerus fracture  [facility] on 8/6 and was found to have proximal left humerus fracture  - Continue shoulder sling, pain-control, and outpatient ortho follow-up      Anemia  - Hgb is 9.5, down from 11.7 on 06/01/24  - No overt bleeding  - Check anemia panel and trend H&H     Chronic HFpEF  - Appears well-compensated   Depression, anxiety  - Effexor , Klonopin , trazodone     Hx of CVA  - Has residual right-sided deficits  - Plavix  and Zocor     Bedbugs -- contact precautions for now  Discharge Diagnoses:  Principal Problem:   Physical deconditioning Active Problems:   COPD (chronic obstructive pulmonary disease) (HCC)   Chronic diastolic CHF (congestive heart failure) (HCC)   Chronic respiratory failure with hypoxia and hypercapnia (HCC)   Anxiety and depression   History of cerebrovascular accident   Normocytic anemia   Closed fracture of left proximal humerus   Discharge Instructions:  Allergies as of 06/10/2024   No Known Allergies  Medication List     TAKE these medications    acetaminophen  500 MG tablet Commonly known as: TYLENOL  Take 1,000 mg by mouth every 8 (eight) hours as needed for moderate pain (pain score 4-6).   clonazePAM  0.5 MG tablet Commonly known as: KLONOPIN  Take 1 tablet (0.5 mg total) by mouth 3 (three) times daily as needed for anxiety. What changed:  when to take this reasons to  take this   clopidogrel  75 MG tablet Commonly known as: PLAVIX  TAKE ONE TABLET DAILY FOR STROKE PREVENTION   furosemide 20 MG tablet Commonly known as: LASIX TAKE ONE TABLET DAILY AS NEEDED FOR FLUID RETENTION   gabapentin  300 MG capsule Commonly known as: NEURONTIN  Take 1 capsule (300 mg total) by mouth at bedtime as needed (neuropathy). What changed:  when to take this reasons to take this   ipratropium-albuterol  0.5-2.5 (3) MG/3ML Soln Commonly known as: DUONEB Take 3 mLs by nebulization every 6 (six) hours as needed.   levothyroxine  25 MCG tablet Commonly known as: SYNTHROID  Take 25 mcg by mouth daily before breakfast.   oxyCODONE -acetaminophen  5-325 MG tablet Commonly known as: PERCOCET/ROXICET Take 1 tablet by mouth every 6 (six) hours as needed for severe pain (pain score 7-10).   pantoprazole  40 MG tablet Commonly known as: PROTONIX  Take 40 mg by mouth 2 (two) times daily.   polyethylene glycol 17 g packet Commonly known as: MIRALAX  / GLYCOLAX  Take 17 g by mouth daily as needed for mild constipation.   simvastatin  40 MG tablet Commonly known as: ZOCOR  Take 40 mg by mouth daily.   traZODone  150 MG tablet Commonly known as: DESYREL  Take 300 mg by mouth at bedtime.   Trelegy Ellipta  100-62.5-25 MCG/ACT Aepb Generic drug: Fluticasone -Umeclidin-Vilant Inhale 1 puff into the lungs daily.   venlafaxine  XR 150 MG 24 hr capsule Commonly known as: EFFEXOR -XR Take 150 mg by mouth daily with breakfast.        Follow-up Information     Tanda Prentice DEL, MD. Schedule an appointment as soon as possible for a visit in 2 week(s).   Specialty: Family Medicine Why: Hospital Follow Up Contact information: [address] [place] [address] [phone]                No Known Allergies Allergies as of 06/10/2024   No Known Allergies      Medication List     TAKE these medications    acetaminophen  500 MG tablet Commonly known as:  TYLENOL  Take 1,000 mg by mouth every 8 (eight) hours as needed for moderate pain (pain score 4-6).   clonazePAM  0.5 MG tablet Commonly known as: KLONOPIN  Take 1 tablet (0.5 mg total) by mouth 3 (three) times daily as needed for anxiety. What changed:  when to take this reasons to take this   clopidogrel  75 MG tablet Commonly known as: PLAVIX  TAKE ONE TABLET DAILY FOR STROKE PREVENTION   furosemide 20 MG tablet Commonly known as: LASIX TAKE ONE TABLET DAILY AS NEEDED FOR FLUID RETENTION   gabapentin  300 MG capsule Commonly known as: NEURONTIN  Take 1 capsule (300 mg total) by mouth at bedtime as needed (neuropathy). What changed:  when to take this reasons to take this   ipratropium-albuterol  0.5-2.5 (3) MG/3ML Soln Commonly known as: DUONEB Take 3 mLs by nebulization every 6 (six) hours as needed.   levothyroxine  25 MCG tablet Commonly known as: SYNTHROID  Take 25 mcg by mouth daily before breakfast.   oxyCODONE -acetaminophen  5-325 MG tablet Commonly  known as: PERCOCET/ROXICET Take 1 tablet by mouth every 6 (six) hours as needed for severe pain (pain score 7-10).   pantoprazole  40 MG tablet Commonly known as: PROTONIX  Take 40 mg by mouth 2 (two) times daily.   polyethylene glycol 17 g packet Commonly known as: MIRALAX  / GLYCOLAX  Take 17 g by mouth daily as needed for mild constipation.   simvastatin  40 MG tablet Commonly known as: ZOCOR  Take 40 mg by mouth daily.   traZODone  150 MG tablet Commonly known as: DESYREL  Take 300 mg by mouth at bedtime.   Trelegy Ellipta  100-62.5-25 MCG/ACT Aepb Generic drug: Fluticasone -Umeclidin-Vilant Inhale 1 puff into the lungs daily.   venlafaxine  XR 150 MG 24 hr capsule Commonly known as: EFFEXOR -XR Take 150 mg by mouth daily with breakfast.        Procedures/Studies: DG Knee Complete 4 Views Right Result Date: 06/08/2024 CLINICAL DATA:  trauma, fall EXAM: RIGHT KNEE - COMPLETE 4+ VIEW COMPARISON:  None Available.  FINDINGS: Osteopenia.No acute fracture or dislocation. Trace joint effusion. Moderate patellofemoral joint space loss with medial compartment joint space loss. Tricompartmental osteophyte formation. Soft tissues are unremarkable. IMPRESSION: 1. Trace joint effusion.  No acute fracture or dislocation. 2. 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 Electronically Signed   By: Rogelia Myers M.D.   On: 06/08/2024 22:16   CT Angio Chest PE W and/or Wo Contrast Result Date: 06/01/2024 CLINICAL DATA:  PE suspected. Witnessed fall. Pain in left arm and shoulder. EXAM: CT ANGIOGRAPHY CHEST WITH CONTRAST TECHNIQUE: Multidetector CT imaging of the chest was performed using the standard protocol during bolus administration of intravenous contrast. Multiplanar CT image reconstructions and MIPs were obtained to evaluate the vascular anatomy. RADIATION DOSE REDUCTION: This exam was performed according to the departmental dose-optimization program which includes automated exposure control, adjustment of the mA and/or kV according to patient size and/or use of iterative reconstruction technique. CONTRAST:  75mL OMNIPAQUE  IOHEXOL  350 MG/ML SOLN COMPARISON:  Same day chest radiograph; CTA chest September 30, 2022 FINDINGS: Cardiovascular: No pericardial effusion. Normal caliber thoracic aorta without dissection. Coronary artery and aortic atherosclerotic calcification. Negative for acute pulmonary embolism. Mediastinum/Nodes: Trachea and esophagus are unremarkable. No thoracic adenopathy. Lungs/Pleura: Bibasilar atelectasis/scarring. Otherwise no focal consolidation. No pleural effusion or pneumothorax. Upper Abdomen: No acute abnormality. Musculoskeletal: Acute comminuted fracture of the surgical neck of the left humerus. Review of the MIP images confirms the above findings. IMPRESSION: 1. Negative for acute pulmonary embolism. 2. Acute comminuted fracture of the surgical neck of the left humerus. 3. Aortic  Atherosclerosis (ICD10-I70.0). Electronically Signed   By: Norman Gatlin M.D.   On: 06/01/2024 19:36   CT CERVICAL SPINE WO CONTRAST Result Date: 06/01/2024 CLINICAL DATA:  Witnessed fall, trauma EXAM: CT CERVICAL SPINE WITHOUT CONTRAST TECHNIQUE: Multidetector CT imaging of the cervical spine was performed without intravenous contrast. Multiplanar CT image reconstructions were also generated. RADIATION DOSE REDUCTION: This exam was performed according to the departmental dose-optimization program which includes automated exposure control, adjustment of the mA and/or kV according to patient size and/or use of iterative reconstruction technique. COMPARISON:  10/16/2023 FINDINGS: Alignment: Alignment is grossly anatomic. Skull base and vertebrae: No acute fracture. No primary bone lesion or focal pathologic process. Soft tissues and spinal canal: No prevertebral fluid or swelling. No visible canal hematoma. Disc levels: Stable mild multilevel spondylosis and facet hypertrophy, with disc space narrowing most pronounced at the C6-7 level. Upper chest: Airway is patent. Visualized portions of the lung apices are clear. Stable stent at  the origin of the left vertebral artery. Other: Reconstructed images demonstrate no additional findings. IMPRESSION: 1. No acute cervical spine fracture. Electronically Signed   By: Ozell Daring M.D.   On: 06/01/2024 19:31   CT HEAD WO CONTRAST Result Date: 06/01/2024 CLINICAL DATA:  Moderate to severe head trauma, witnessed fall EXAM: CT HEAD WITHOUT CONTRAST TECHNIQUE: Contiguous axial images were obtained from the base of the skull through the vertex without intravenous contrast. RADIATION DOSE REDUCTION: This exam was performed according to the departmental dose-optimization program which includes automated exposure control, adjustment of the mA and/or kV according to patient size and/or use of iterative reconstruction technique. COMPARISON:  10/17/2023 FINDINGS: Brain: No  evidence of acute infarct or hemorrhage. Chronic ischemic changes are seen within the right frontal and parietal regions, stable. Lateral ventricles and midline structures are unremarkable. No acute extra-axial fluid collections. No mass effect. Vascular: No hyperdense vessel or unexpected calcification. Skull: Normal. Negative for fracture or focal lesion. Sinuses/Orbits: No acute finding. Other: None. IMPRESSION: 1. No acute intracranial process. Chronic ischemic changes as above. Electronically Signed   By: Ozell Daring M.D.   On: 06/01/2024 19:29   DG Chest Port 1 View Result Date: 06/01/2024 CLINICAL DATA:  Trauma, fall EXAM: PORTABLE CHEST - 1 VIEW COMPARISON:  October 18, 2023 FINDINGS: Lower lung volumes with bronchovascular crowding. No focal airspace consolidation, pleural effusion, or pneumothorax. Mild cardiomegaly. Aortic atherosclerosis. No acute fracture or destructive lesions. Multilevel thoracic osteophytosis. Bilateral surgical clips in the breast or axilla. IMPRESSION: Low lung volumes.  Otherwise, no acute cardiopulmonary abnormality. Electronically Signed   By: Rogelia Myers M.D.   On: 06/01/2024 19:16   DG Pelvis Portable Result Date: 06/01/2024 CLINICAL DATA:  Trauma, fall EXAM: PORTABLE PELVIS 1-2 VIEWS COMPARISON:  None Available. FINDINGS: No evidence of pelvic fracture or diastasis.No acute hip fracture or dislocation.Multilevel degenerative disc disease of the spine.Soft tissues are unremarkable. IMPRESSION: No acute fracture, pelvic bone diastasis, or dislocation. Electronically Signed   By: Rogelia Myers M.D.   On: 06/01/2024 19:15   DG Elbow Complete Left Result Date: 06/01/2024 CLINICAL DATA:  Blunt Trauma EXAM: LEFT ELBOW - COMPLETE 3+ VIEW COMPARISON:  None Available. FINDINGS: No acute, displaced fracture or dislocation. Suboptimal evaluation for joint effusion due to patient positioning. There is no evidence of arthropathy or other focal bone abnormality. Soft tissues  are unremarkable. IMPRESSION: Suboptimal evaluation for joint effusion due to patient positioning. Otherwise, no acute, displaced fracture or dislocation. Repeat radiographs of the elbow with optimal positioning recommended to assess for joint effusion and occult fracture. Electronically Signed   By: Rogelia Myers M.D.   On: 06/01/2024 19:14   DG Shoulder Left Port Result Date: 06/01/2024 CLINICAL DATA:  Blunt Trauma EXAM: LEFT SHOULDER COMPARISON:  None Available. FINDINGS: Osteopenia.Comminuted, mildly displaced fracture of the proximal humerus, centered at the surgical neck, and involving the greater tuberosity. No glenohumeral joint dislocation. There is no evidence of arthropathy or other focal bone abnormality. Axillary surgical clips. IMPRESSION: Comminuted, mildly displaced proximal left humeral fracture. Electronically Signed   By: Rogelia Myers M.D.   On: 06/01/2024 19:10     Subjective: Pt says she is agreeable to participate in skilled rehab. No other complaints other than fatigue.   Discharge Exam: Vitals:   06/10/24 0656 06/10/24 0800  BP: (!) 111/49   Pulse: 61   Resp: 20   Temp: 97.8 F (36.6 C)   SpO2: 97% 97%   Vitals:   06/09/24 2013 06/09/24  2211 06/10/24 0656 06/10/24 0800  BP:  (!) 124/55 (!) 111/49   Pulse:  61 61   Resp:  (!) 21 20   Temp:  98.1 F (36.7 C) 97.8 F (36.6 C)   TempSrc:  Oral Oral   SpO2: 96% 98% 97% 97%  Weight:      Height:       General: Pt is alert, awake, not in acute distress Cardiovascular: normal S1/S2 +, no rubs, no gallops Respiratory: CTA bilaterally, no wheezing, no rhonchi Abdominal: Soft, NT, ND, bowel sounds + Extremities: trace edema, no cyanosis   The results of significant diagnostics from this hospitalization (including imaging, microbiology, ancillary and laboratory) are listed below for reference.     Microbiology: No results found for this or any previous visit (from the past 240 hours).   Labs: BNP (last 3  results) No results for input(s): BNP in the last 8760 hours. Basic Metabolic Panel: Recent Labs  Lab 06/08/24 2205 06/09/24 0618 06/10/24 0416  NA 142 139 138  K 3.6 3.7 3.6  CL 93* 93* 95*  CO2 40* 36* 34*  GLUCOSE 119* 101* 85  BUN 12 10 11   CREATININE 0.69 0.67 0.78  CALCIUM  8.1* 8.1* 8.1*  MG 2.3  --   --   PHOS 3.4  --   --    Liver Function Tests: Recent Labs  Lab 06/08/24 2205  AST 11*  ALT 9  ALKPHOS 33*  BILITOT 0.6  PROT 5.7*  ALBUMIN 2.4*   No results for input(s): LIPASE, AMYLASE in the last 168 hours. Recent Labs  Lab 06/09/24 0232  AMMONIA 28   CBC: Recent Labs  Lab 06/08/24 2205 06/09/24 0618 06/10/24 0416  WBC 5.4 5.7 6.0  HGB 9.5* 9.8* 9.9*  HCT 32.0* 32.1* 33.1*  MCV 93.6 93.0 93.0  PLT 188 176 181   Cardiac Enzymes: Recent Labs  Lab 06/08/24 2205  CKTOTAL 44   BNP: Invalid input(s): POCBNP CBG: No results for input(s): GLUCAP in the last 168 hours. D-Dimer No results for input(s): DDIMER in the last 72 hours. Hgb A1c No results for input(s): HGBA1C in the last 72 hours. Lipid Profile No results for input(s): CHOL, HDL, LDLCALC, TRIG, CHOLHDL, LDLDIRECT in the last 72 hours. Thyroid  function studies Recent Labs    06/08/24 2205  TSH 19.875*   Anemia work up Recent Labs    06/08/24 2205  VITAMINB12 269  FOLATE 6.3  FERRITIN 122  TIBC 202*  IRON 34  RETICCTPCT 3.0   Urinalysis    Component Value Date/Time   COLORURINE YELLOW 06/01/2024 1804   APPEARANCEUR CLEAR 06/01/2024 1804   LABSPEC 1.019 06/01/2024 1804   PHURINE 5.0 06/01/2024 1804   GLUCOSEU NEGATIVE 06/01/2024 1804   HGBUR NEGATIVE 06/01/2024 1804   BILIRUBINUR NEGATIVE 06/01/2024 1804   KETONESUR NEGATIVE 06/01/2024 1804   PROTEINUR NEGATIVE 06/01/2024 1804   UROBILINOGEN 0.2 12/05/2009 1953   NITRITE NEGATIVE 06/01/2024 1804   LEUKOCYTESUR NEGATIVE 06/01/2024 1804   Sepsis Labs Recent Labs  Lab 06/08/24 2205  06/09/24 0618 06/10/24 0416  WBC 5.4 5.7 6.0   Microbiology No results found for this or any previous visit (from the past 240 hours).  Time coordinating discharge: 35 mins   SIGNED:  Afton Louder, MD  [facility] 06/10/2024, 11:28 AM How to contact the [facility] Attending or Consulting provider 7A - 7P or covering provider during after hours 7P -7A, for this patient?  Check the care team in [facility] and look  for a) attending/consulting [facility] provider listed and b) the [facility] team listed Log into [email or web address] and use [place]'s universal password to access. If you do not have the password, please contact the hospital operator. Locate the [facility] provider you are looking for under [facility] and page to a number that you can be directly reached. If you still have difficulty reaching the provider, please page the [facility] (Director on Call) for the Hospitalists listed on amion for assistance.

## 2024-06-12 ENCOUNTER — Other Ambulatory Visit: Payer: Self-pay

## 2024-06-16 ENCOUNTER — Other Ambulatory Visit: Payer: Self-pay

## 2024-06-17 ENCOUNTER — Encounter: Payer: Self-pay | Admitting: Radiology

## 2024-06-30 ENCOUNTER — Other Ambulatory Visit (INDEPENDENT_AMBULATORY_CARE_PROVIDER_SITE_OTHER): Payer: Self-pay

## 2024-06-30 ENCOUNTER — Encounter: Payer: Self-pay | Admitting: Orthopedic Surgery

## 2024-06-30 ENCOUNTER — Telehealth: Payer: Self-pay | Admitting: Orthopedic Surgery

## 2024-06-30 ENCOUNTER — Other Ambulatory Visit: Payer: Self-pay | Admitting: Orthopedic Surgery

## 2024-06-30 ENCOUNTER — Ambulatory Visit (INDEPENDENT_AMBULATORY_CARE_PROVIDER_SITE_OTHER): Admitting: Orthopedic Surgery

## 2024-06-30 VITALS — BP 104/45 | Ht 63.0 in | Wt 216.0 lb

## 2024-06-30 DIAGNOSIS — S42202A Unspecified fracture of upper end of left humerus, initial encounter for closed fracture: Secondary | ICD-10-CM

## 2024-06-30 MED ORDER — HYDROCODONE-ACETAMINOPHEN 5-325 MG PO TABS: 1.0000 | ORAL_TABLET | ORAL | 0 refills | Status: AC | PRN

## 2024-06-30 MED ORDER — HYDROCODONE-ACETAMINOPHEN 5-325 MG PO TABS: 1.0000 | ORAL_TABLET | ORAL | 0 refills | Status: DC | PRN

## 2024-06-30 NOTE — Telephone Encounter (Signed)
 C[facility] at [facility] was sent to incorrect pharmacy I will call them  Was sent to another pharmacy [facility] Already / yes he is aware of other meds.

## 2024-06-30 NOTE — Telephone Encounter (Signed)
 Left message to [facility] at [facility]  Was resent to [facility] already ll those meds were in her chart/ he is aware

## 2024-06-30 NOTE — Progress Notes (Signed)
 Chief Complaint  Patient presents with   Shoulder Pain    Left 06/01/24    This is a 68 year old female presents to us  with a proximal humerus fracture of the left shoulder sustained on August 6 The patient was admitted to a facility due to inability to care for self at home.  She has a history of COPD she is on oxygen, she has had several mini strokes.  History of chronic diastolic heart failure polycythemia  According to her she has started physical therapy.  Her initial x-ray showed a nondisplaced proximal humerus fracture with some comminution  Today's x-ray shows displaced fracture with 50% displacement head related to shaft.  She complains of severe pain and says that the oxycodone  only last for about 4 hours and it is scheduled for every 6 hours.  She is also complaining that her sling does not fit  Review of systems  Shortness of breath  Loss of balance  Frequent falls  Past Medical History:  Diagnosis Date   Anxiety    Arthritis    Cancer (HCC)    COPD (chronic obstructive pulmonary disease) (HCC)    Depression    GERD (gastroesophageal reflux disease)    Headache(784.0)    Hyperlipidemia    Hypertension    hx   Incontinent of urine    Oxygen dependent    2 L via [place] 24 hours per day   Pneumonia    Shortness of breath    on oxygen   Stroke [facility])     Past Surgical History:  Procedure Laterality Date   CAROTID STENT     Has known BICA occlusions since 2011; s/p left vertebral artery stent 12/26/09 & right VA stent 02/12/10   CHOLECYSTECTOMY     PORT-A-CATH REMOVAL N/A 12/01/2022   Procedure: REMOVAL PORT-A-CATH;  Surgeon: Curvin Deward MOULD, MD;  Location: [facility] OR;  Service: General;  Laterality: N/A;   PORTACATH PLACEMENT Right 08/19/2021   Procedure: INSERTION PORT-A-CATH WITH ULTRASOUND;  Surgeon: Curvin Deward MOULD, MD;  Location: [facility] OR;  Service: General;  Laterality: Right;   ROTATOR CUFF REPAIR     SENTINEL NODE BIOPSY Bilateral 08/19/2021   Procedure: BILATERAL  SENTINEL NODE BIOPSY;  Surgeon: Curvin Deward MOULD, MD;  Location: [facility] OR;  Service: General;  Laterality: Bilateral;   TOTAL MASTECTOMY Bilateral 08/19/2021   Procedure: BILATERAL TOTAL MASTECTOMY;  Surgeon: Curvin Deward III, MD;  Location: [facility] OR;  Service: General;  Laterality: Bilateral;   TUBAL LIGATION     No Known Allergies  BP (!) 104/45 Comment: 06/08/24  Ht 5' 3 (1.6 m)   Wt 216 lb (98 kg)   BMI 38.26 kg/m   Physical Exam Vitals and nursing note reviewed.  Constitutional:      Appearance: Normal appearance. She is obese.  HENT:     Head: Normocephalic and atraumatic.  Eyes:     General: No scleral icterus.       Right eye: No discharge.        Left eye: No discharge.     Extraocular Movements: Extraocular movements intact.     Conjunctiva/sclera: Conjunctivae normal.     Pupils: Pupils are equal, round, and reactive to light.  Cardiovascular:     Rate and Rhythm: Normal rate.     Pulses: Normal pulses.  Pulmonary:     Effort: No respiratory distress.     Comments: Currently on O2 by nasal cannula Skin:    General: Skin is warm and dry.  Capillary Refill: Capillary refill takes less than 2 seconds.  Neurological:     General: No focal deficit present.     Mental Status: She is alert and oriented to person, place, and time.  Psychiatric:        Mood and Affect: Mood normal.        Behavior: Behavior normal.        Thought Content: Thought content normal.        Judgment: Judgment normal.    Left hand severely swollen left arm ecchymotic but minimally so near the elbow tenderness over the proximal humerus no deformity probably secondary to adipose tissue covering the area.  Today's x-ray shows that the fracture has displaced  Recommend stopping physical therapy for 2 weeks re x-ray to see if any more healing has occurred as currently there is very minimal  I do not think she is a surgical candidate  I switched her from oxycodone  which is for acute pain to Norco  and made it every 4 hours  I do not think she will be a surgical candidate and will have to live with the deformity and residual deficits in terms of motion.  X-ray in 2 weeks  Meds ordered this encounter  Medications   HYDROcodone -acetaminophen  (NORCO/VICODIN) 5-325 MG tablet    Sig: Take 1 tablet by mouth every 4 (four) hours as needed for up to 5 days for moderate pain (pain score 4-6).    Dispense:  30 tablet    Refill:  0

## 2024-06-30 NOTE — Telephone Encounter (Signed)
 Dr. Areatha pt - [facility] [place] [phone] lvm stating that they received a script for Hydrocodone  5-325.  She wanted to make sure he knows that the patient is getting Clonazepam  and Gabapentin , prescribed by Dr. Shaun Lazoss.

## 2024-06-30 NOTE — Progress Notes (Signed)
  Intake history:  Chief Complaint  Patient presents with   Shoulder Pain    Left 06/08/24     BP (!) 104/45 Comment: 06/08/24  Ht 5' 3 (1.6 m)   Wt 216 lb (98 kg)   BMI 38.26 kg/m  Body mass index is 38.26 kg/m.    WHAT ARE WE SEEING YOU FOR TODAY?   left shoulder  How long has this bothered you? (DOI?DOS?WS?)  on 06/01/24  Was there an injury? Yes  Anticoag.  Yes  Diabetes No  Heart disease Yes  Hypertension Yes  SMOKING HX Yes/ not since in facility   Kidney disease No  Any ALLERGIES __________________No Known Allergies ____________________________   Treatment:  Have you taken:  Tylenol  Yes  Advil No  Had PT No  Had injection No  Other  ________________Oxycodone_________

## 2024-06-30 NOTE — Telephone Encounter (Signed)
 Pharmacy was verified as [facility] but when patient was leaving assistant instructed it is actually [facility] I have changed pharmacy will you please resend?

## 2024-07-01 ENCOUNTER — Other Ambulatory Visit: Payer: Self-pay

## 2024-07-14 ENCOUNTER — Other Ambulatory Visit (INDEPENDENT_AMBULATORY_CARE_PROVIDER_SITE_OTHER): Payer: Self-pay

## 2024-07-14 ENCOUNTER — Encounter: Payer: Self-pay | Admitting: Hematology and Oncology

## 2024-07-14 ENCOUNTER — Ambulatory Visit (INDEPENDENT_AMBULATORY_CARE_PROVIDER_SITE_OTHER): Admitting: Orthopedic Surgery

## 2024-07-14 DIAGNOSIS — S42202D Unspecified fracture of upper end of left humerus, subsequent encounter for fracture with routine healing: Secondary | ICD-10-CM | POA: Diagnosis not present

## 2024-07-14 MED ORDER — ACETAMINOPHEN 500 MG PO TABS: 500.0000 mg | ORAL_TABLET | Freq: Four times a day (QID) | ORAL | 2 refills | Status: AC | PRN

## 2024-07-14 MED ORDER — OXYCODONE HCL 5 MG PO TABS: 5.0000 mg | ORAL_TABLET | ORAL | 0 refills | Status: AC | PRN

## 2024-07-14 NOTE — Progress Notes (Signed)
    Chief Complaint  Patient presents with   Fracture    L Humerus DOI 06/01/24    Encounter Diagnosis  Name Primary?   Closed traumatic nondisplaced fracture of proximal end of left humerus with routine healing, subsequent encounter 06/01/24 Yes    What pharmacy do you use ? ___________________________  DOI/DOS/ Date: 06/01/24  Improved

## 2024-07-14 NOTE — Progress Notes (Signed)
    Chief Complaint  Patient presents with   Fracture    L Humerus DOI 06/01/24    Encounter Diagnosis  Name Primary?   Closed traumatic nondisplaced fracture of proximal end of left humerus with routine healing, subsequent encounter 06/01/24 Yes    What pharmacy do you use ? [facility]_______  DOI/DOS/ Date: 06/01/24  Improved +/-  Still c/o pain left elbow and left shoulder   Patient has significant edema in the left forearm and left elbow.  She is tender at the proximal humerus and fracture site.  DG Shoulder Left Result Date: 07/14/2024 X-ray report Chief complaint fracture left shoulder Displaced fracture proximal humerus left shoulder.  Angulation on the lateral x-ray seems worse than prior.  The fracture has shown some attempts toward healing with callus formation glenohumeral joint still articulating normally Angulated fracture left proximal humerus cannot really tell if healing has occurred or not may need further imaging such as CT scan to assess    Imaging suggest perhaps delayed union.  Repeat x-rays in 6 weeks.  Determine at that time if CT scan needed.  However, I do not think the patient is a good surgical candidate and CT scan may be just for academic purposes if we are not planning surgical intervention which is probably replacement  I adjusted the pain medications as follows as the patient said the Percocet Q6 was not lasting long enough  Meds ordered this encounter  Medications   oxyCODONE  (OXY IR/ROXICODONE ) 5 MG immediate release tablet    Sig: Take 1 tablet (5 mg total) by mouth every 4 (four) hours as needed for up to 5 days for severe pain (pain score 7-10).    Dispense:  30 tablet    Refill:  0   acetaminophen  (TYLENOL ) 500 MG tablet    Sig: Take 1 tablet (500 mg total) by mouth every 6 (six) hours as needed for moderate pain (pain score 4-6).    Dispense:  90 tablet    Refill:  2

## 2024-07-15 ENCOUNTER — Other Ambulatory Visit: Payer: Self-pay

## 2024-08-18 ENCOUNTER — Encounter: Payer: Self-pay | Admitting: Hematology and Oncology

## 2024-08-25 ENCOUNTER — Ambulatory Visit: Admitting: Orthopedic Surgery

## 2024-08-25 ENCOUNTER — Encounter: Payer: Self-pay | Admitting: Orthopedic Surgery

## 2024-08-25 ENCOUNTER — Other Ambulatory Visit (INDEPENDENT_AMBULATORY_CARE_PROVIDER_SITE_OTHER): Payer: Self-pay

## 2024-08-25 DIAGNOSIS — S42202D Unspecified fracture of upper end of left humerus, subsequent encounter for fracture with routine healing: Secondary | ICD-10-CM

## 2024-08-25 NOTE — Progress Notes (Signed)
    08/25/2024   Chief Complaint  Patient presents with   Shoulder Injury    Left 06/01/24    Encounter Diagnosis  Name Primary?   Closed traumatic nondisplaced fracture of proximal end of left humerus with routine healing, subsequent encounter 06/01/24 Yes    What pharmacy do you use ? __________in facility _________________  DOI/DOS/ Date: 06/01/24  Did you get better, worse or no change (Answer below)   Unchanged/ patient states not much better, has pain shoulder / arm is very swollen including hand Left

## 2024-08-25 NOTE — Progress Notes (Signed)
    08/25/2024   Chief Complaint  Patient presents with   Shoulder Injury    Left 06/01/24    Encounter Diagnosis  Name Primary?   Closed traumatic nondisplaced fracture of proximal end of left humerus with routine healing, subsequent encounter 06/01/24 Yes    What pharmacy do you use ? 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  Nothing has changed in that regard.  Her passive range of motion is abduction 45 degrees flexion 40 degrees she has significant lymphedema in the left upper extremity I recommended they get a lymphedema glove continue of therapy and follow-up in 2 months

## 2024-08-26 ENCOUNTER — Other Ambulatory Visit: Payer: Self-pay

## 2024-08-29 ENCOUNTER — Encounter: Payer: Self-pay | Admitting: Radiology

## 2024-10-24 ENCOUNTER — Other Ambulatory Visit: Payer: Self-pay

## 2024-10-24 ENCOUNTER — Ambulatory Visit (INDEPENDENT_AMBULATORY_CARE_PROVIDER_SITE_OTHER): Admitting: Orthopedic Surgery

## 2024-10-24 DIAGNOSIS — S42202D Unspecified fracture of upper end of left humerus, subsequent encounter for fracture with routine healing: Secondary | ICD-10-CM

## 2024-10-24 MED ORDER — HYDROCODONE-ACETAMINOPHEN 5-325 MG PO TABS: 1.0000 | ORAL_TABLET | Freq: Four times a day (QID) | ORAL | 0 refills | Status: AC | PRN

## 2024-10-24 NOTE — Patient Instructions (Signed)
 [facility] [address]  [phone]  We will send referral there, you call next week to schedule, they will call you too.

## 2024-10-24 NOTE — Addendum Note (Signed)
 Addended by: MARGRETTE TAFT BRAVO on: 10/24/2024 09:26 AM   Modules accepted: Orders

## 2024-10-24 NOTE — Progress Notes (Addendum)
" ° ° °  10/24/2024   Chief Complaint  Patient presents with   Shoulder Injury    Left 06/01/24 not using shoulder states painful can't use it     Encounter Diagnosis  Name Primary?   Closed traumatic nondisplaced fracture of proximal end of left humerus with routine healing, subsequent encounter 06/01/24 Yes   Past Medical History:  Diagnosis Date   Anxiety    Arthritis    Cancer (HCC)    COPD (chronic obstructive pulmonary disease) (HCC) requiring oxygen    Depression    GERD (gastroesophageal reflux disease)    Headache(784.0)    Hyperlipidemia    Hypertension    hx   Incontinent of urine    Oxygen dependent    2 L via [place] 24 hours per day   Pneumonia    Shortness of breath    on oxygen   Stroke [facility])     What pharmacy do you use ? ____in facility____/ [name] ___________________  DOI/DOS/ Date: 06/01/24  Did you get better, worse or no change (Answer below)   Unchanged  Decrease swelling in left upper extremity but the patient still has no real functional abduction or flexion  Repeat imaging shows the following DG Shoulder Left Result Date: 10/24/2024 Left shoulder fracture follow-up x-ray X-ray shows aProximal humerus fracture With 50% shaft width displacement of the humeral head which is impacted on the shaft of the humerus there is posterior angulation on the lateral and reasonable alignment on the transthoracic lateral see previous x-ray The fracture appears to have healed and this position     The patient is not a candidate for surgical intervention and she continues to have chronic pain  Recommend chronic pain management  No further interventions from orthopedic leased patient is not a surgical candidate  Meds ordered this encounter  Medications   HYDROcodone -acetaminophen  (NORCO/VICODIN) 5-325 MG tablet    Sig: Take 1 tablet by mouth every 6 (six) hours as needed for moderate pain (pain score 4-6).    Dispense:  30 tablet    Refill:  0   Patient was  released today      "

## 2024-10-24 NOTE — Progress Notes (Signed)
" ° ° °  10/24/2024   Chief Complaint  Patient presents with   Shoulder Injury    Left 06/01/24 not using shoulder states painful can't use it     No diagnosis found.  What pharmacy do you use ? ____in facility____/ [name] ___________________  DOI/DOS/ Date: 06/01/24  Did you get better, worse or no change (Answer below)   Unchanged      "

## 2024-11-01 ENCOUNTER — Telehealth: Payer: Self-pay | Admitting: Orthopedic Surgery

## 2024-11-01 NOTE — Telephone Encounter (Signed)
 Dr. Areatha pt - [place] w/[facility] [phone] lvm checking the status of referral to the pain management clinic.  She stated she has been trying to get them and nothing.  She would like a call back.

## 2024-11-01 NOTE — Telephone Encounter (Signed)
 Information was sent on Jan 2nd [facility] [address]  [phone]  She can call to schedule I left message to advise.

## 2024-11-08 ENCOUNTER — Telehealth: Payer: Self-pay | Admitting: Orthopedic Surgery

## 2024-11-08 NOTE — Telephone Encounter (Signed)
 Dr. Areatha pt - transcript from the voicemail left:    This is Belinda calling from [facility] and I'm calling for the patient April [name] dob is [birth year]. I have called the pain clinic and they have not received no referral from y'all yet. So would you please give me a call back and let me know what's going on with the referral and why hasn't been sent? Or are we just not going to send her or what? So I need to know. So you can reach me at [phone] and just ask for Belinda. Thanks.

## 2024-11-09 NOTE — Telephone Encounter (Signed)
Noted will work on.

## 2024-11-09 NOTE — Telephone Encounter (Signed)
 Dr. Areatha pt - [place] w/[facility] [phone] lvm stating she just got off the phone w/[facility] and they told her they still do not have anything from us .  She would like a call back advising what's going on.

## 2024-11-09 NOTE — Telephone Encounter (Signed)
 April [name] has sent them everything she is working on it She has told me she will call them.

## 2024-11-09 NOTE — Telephone Encounter (Signed)
 I called and left message on vm to April [name] letting her know this has bene faxed again to [facility]
